# Patient Record
Sex: Female | Born: 1970 | Race: White | Hispanic: No | Smoking: Current every day smoker
Health system: Southern US, Community
[De-identification: ages and names within clinical notes are randomized; demographics above are authoritative.]

## PROBLEM LIST (undated history)

## (undated) DIAGNOSIS — L98499 Non-pressure chronic ulcer of skin of other sites with unspecified severity: Secondary | ICD-10-CM

## (undated) DIAGNOSIS — M1990 Unspecified osteoarthritis, unspecified site: Secondary | ICD-10-CM

## (undated) DIAGNOSIS — K469 Unspecified abdominal hernia without obstruction or gangrene: Secondary | ICD-10-CM

## (undated) DIAGNOSIS — K259 Gastric ulcer, unspecified as acute or chronic, without hemorrhage or perforation: Secondary | ICD-10-CM

## (undated) DIAGNOSIS — R519 Headache, unspecified: Secondary | ICD-10-CM

## (undated) DIAGNOSIS — R7303 Prediabetes: Secondary | ICD-10-CM

## (undated) DIAGNOSIS — E119 Type 2 diabetes mellitus without complications: Secondary | ICD-10-CM

## (undated) DIAGNOSIS — G8929 Other chronic pain: Secondary | ICD-10-CM

## (undated) DIAGNOSIS — Z8719 Personal history of other diseases of the digestive system: Secondary | ICD-10-CM

## (undated) DIAGNOSIS — K219 Gastro-esophageal reflux disease without esophagitis: Secondary | ICD-10-CM

## (undated) DIAGNOSIS — IMO0001 Reserved for inherently not codable concepts without codable children: Secondary | ICD-10-CM

## (undated) DIAGNOSIS — G709 Myoneural disorder, unspecified: Secondary | ICD-10-CM

## (undated) DIAGNOSIS — F32A Depression, unspecified: Secondary | ICD-10-CM

## (undated) DIAGNOSIS — I10 Essential (primary) hypertension: Secondary | ICD-10-CM

## (undated) DIAGNOSIS — IMO0002 Reserved for concepts with insufficient information to code with codable children: Secondary | ICD-10-CM

## (undated) DIAGNOSIS — F419 Anxiety disorder, unspecified: Secondary | ICD-10-CM

## (undated) HISTORY — PX: BACK SURGERY: SHX140

## (undated) HISTORY — PX: TUBAL LIGATION: SHX77

## (undated) HISTORY — PX: APPENDECTOMY: SHX54

## (undated) HISTORY — DX: Other chronic pain: G89.29

## (undated) HISTORY — DX: Reserved for inherently not codable concepts without codable children: IMO0001

## (undated) HISTORY — PX: CHOLECYSTECTOMY: SHX55

## (undated) HISTORY — PX: CARPAL TUNNEL RELEASE: SHX101

## (undated) HISTORY — DX: Essential (primary) hypertension: I10

## (undated) HISTORY — DX: Reserved for concepts with insufficient information to code with codable children: IMO0002

## (undated) HISTORY — PX: MASTECTOMY, PARTIAL: SHX709

## (undated) HISTORY — PX: ROTATOR CUFF REPAIR: SHX139

## (undated) HISTORY — PX: MOUTH SURGERY: SHX715

## (undated) HISTORY — PX: INTRAUTERINE DEVICE INSERTION: SHX323

## (undated) HISTORY — PX: BREAST SURGERY: SHX581

## (undated) HISTORY — DX: Unspecified abdominal hernia without obstruction or gangrene: K46.9

## (undated) HISTORY — DX: Non-pressure chronic ulcer of skin of other sites with unspecified severity: L98.499

---

## 2000-03-11 ENCOUNTER — Encounter: Payer: Self-pay | Admitting: Neurological Surgery

## 2000-03-11 ENCOUNTER — Inpatient Hospital Stay (HOSPITAL_COMMUNITY): Admission: RE | Admit: 2000-03-11 | Discharge: 2000-03-12 | Payer: Self-pay | Admitting: Neurological Surgery

## 2001-03-14 ENCOUNTER — Encounter: Payer: Self-pay | Admitting: Family Medicine

## 2001-03-14 ENCOUNTER — Ambulatory Visit (HOSPITAL_COMMUNITY): Admission: RE | Admit: 2001-03-14 | Discharge: 2001-03-14 | Payer: Self-pay | Admitting: Family Medicine

## 2001-04-16 ENCOUNTER — Ambulatory Visit (HOSPITAL_COMMUNITY): Admission: RE | Admit: 2001-04-16 | Discharge: 2001-04-16 | Payer: Self-pay | Admitting: Pediatrics

## 2001-04-16 ENCOUNTER — Encounter: Payer: Self-pay | Admitting: General Surgery

## 2003-01-15 ENCOUNTER — Emergency Department (HOSPITAL_COMMUNITY): Admission: EM | Admit: 2003-01-15 | Discharge: 2003-01-16 | Payer: Self-pay | Admitting: *Deleted

## 2003-01-17 ENCOUNTER — Emergency Department (HOSPITAL_COMMUNITY): Admission: EM | Admit: 2003-01-17 | Discharge: 2003-01-17 | Payer: Self-pay | Admitting: *Deleted

## 2003-10-05 ENCOUNTER — Emergency Department (HOSPITAL_COMMUNITY): Admission: EM | Admit: 2003-10-05 | Discharge: 2003-10-06 | Payer: Self-pay | Admitting: Emergency Medicine

## 2004-07-25 ENCOUNTER — Emergency Department (HOSPITAL_COMMUNITY): Admission: EM | Admit: 2004-07-25 | Discharge: 2004-07-25 | Payer: Self-pay | Admitting: *Deleted

## 2004-12-27 ENCOUNTER — Ambulatory Visit (HOSPITAL_COMMUNITY): Admission: RE | Admit: 2004-12-27 | Discharge: 2004-12-27 | Payer: Self-pay | Admitting: Family Medicine

## 2005-02-06 ENCOUNTER — Ambulatory Visit (HOSPITAL_COMMUNITY): Admission: RE | Admit: 2005-02-06 | Discharge: 2005-02-06 | Payer: Self-pay | Admitting: Family Medicine

## 2005-03-14 ENCOUNTER — Ambulatory Visit: Payer: Self-pay | Admitting: Orthopedic Surgery

## 2005-03-29 ENCOUNTER — Emergency Department (HOSPITAL_COMMUNITY): Admission: EM | Admit: 2005-03-29 | Discharge: 2005-03-29 | Payer: Self-pay | Admitting: Emergency Medicine

## 2005-04-25 ENCOUNTER — Ambulatory Visit: Payer: Self-pay | Admitting: Orthopedic Surgery

## 2005-05-04 ENCOUNTER — Ambulatory Visit (HOSPITAL_COMMUNITY): Admission: RE | Admit: 2005-05-04 | Discharge: 2005-05-04 | Payer: Self-pay | Admitting: Orthopedic Surgery

## 2005-05-04 ENCOUNTER — Ambulatory Visit: Payer: Self-pay | Admitting: Orthopedic Surgery

## 2005-05-07 ENCOUNTER — Ambulatory Visit: Payer: Self-pay | Admitting: Orthopedic Surgery

## 2005-05-14 ENCOUNTER — Ambulatory Visit: Payer: Self-pay | Admitting: Orthopedic Surgery

## 2005-05-30 ENCOUNTER — Ambulatory Visit: Payer: Self-pay | Admitting: Orthopedic Surgery

## 2005-06-06 ENCOUNTER — Ambulatory Visit: Payer: Self-pay | Admitting: Orthopedic Surgery

## 2005-12-20 ENCOUNTER — Ambulatory Visit: Payer: Self-pay | Admitting: Orthopedic Surgery

## 2006-02-26 ENCOUNTER — Ambulatory Visit (HOSPITAL_COMMUNITY): Admission: RE | Admit: 2006-02-26 | Discharge: 2006-02-26 | Payer: Self-pay | Admitting: Orthopedic Surgery

## 2006-02-26 ENCOUNTER — Ambulatory Visit: Payer: Self-pay | Admitting: Orthopedic Surgery

## 2006-02-28 ENCOUNTER — Ambulatory Visit: Payer: Self-pay | Admitting: Orthopedic Surgery

## 2006-03-11 ENCOUNTER — Ambulatory Visit: Payer: Self-pay | Admitting: Orthopedic Surgery

## 2006-03-14 ENCOUNTER — Ambulatory Visit: Payer: Self-pay | Admitting: Orthopedic Surgery

## 2006-09-11 ENCOUNTER — Encounter (HOSPITAL_COMMUNITY): Admission: RE | Admit: 2006-09-11 | Discharge: 2006-10-11 | Payer: Self-pay | Admitting: Family Medicine

## 2006-12-20 ENCOUNTER — Ambulatory Visit (HOSPITAL_COMMUNITY): Admission: RE | Admit: 2006-12-20 | Discharge: 2006-12-20 | Payer: Self-pay | Admitting: Family Medicine

## 2007-03-12 ENCOUNTER — Ambulatory Visit (HOSPITAL_COMMUNITY): Admission: RE | Admit: 2007-03-12 | Discharge: 2007-03-13 | Payer: Self-pay | Admitting: Neurosurgery

## 2007-05-23 DIAGNOSIS — Z8679 Personal history of other diseases of the circulatory system: Secondary | ICD-10-CM

## 2007-05-28 ENCOUNTER — Telehealth (INDEPENDENT_AMBULATORY_CARE_PROVIDER_SITE_OTHER): Payer: Self-pay | Admitting: Radiology

## 2007-05-28 ENCOUNTER — Ambulatory Visit: Payer: Self-pay | Admitting: Orthopedic Surgery

## 2007-05-28 DIAGNOSIS — M25519 Pain in unspecified shoulder: Secondary | ICD-10-CM

## 2007-05-28 DIAGNOSIS — M2550 Pain in unspecified joint: Secondary | ICD-10-CM | POA: Insufficient documentation

## 2007-06-03 ENCOUNTER — Ambulatory Visit: Payer: Self-pay | Admitting: Orthopedic Surgery

## 2007-06-05 ENCOUNTER — Telehealth: Payer: Self-pay | Admitting: Orthopedic Surgery

## 2007-07-09 ENCOUNTER — Encounter: Payer: Self-pay | Admitting: Orthopedic Surgery

## 2007-07-11 ENCOUNTER — Encounter: Payer: Self-pay | Admitting: Orthopedic Surgery

## 2007-07-11 ENCOUNTER — Ambulatory Visit: Payer: Self-pay | Admitting: Orthopedic Surgery

## 2007-07-11 ENCOUNTER — Ambulatory Visit (HOSPITAL_COMMUNITY): Admission: RE | Admit: 2007-07-11 | Discharge: 2007-07-11 | Payer: Self-pay | Admitting: Orthopedic Surgery

## 2007-07-15 ENCOUNTER — Ambulatory Visit: Payer: Self-pay | Admitting: Orthopedic Surgery

## 2007-07-21 ENCOUNTER — Encounter: Payer: Self-pay | Admitting: Orthopedic Surgery

## 2007-07-21 ENCOUNTER — Encounter (HOSPITAL_COMMUNITY): Admission: RE | Admit: 2007-07-21 | Discharge: 2007-07-23 | Payer: Self-pay | Admitting: Orthopedic Surgery

## 2007-07-25 ENCOUNTER — Encounter (HOSPITAL_COMMUNITY): Admission: RE | Admit: 2007-07-25 | Discharge: 2007-08-24 | Payer: Self-pay | Admitting: Orthopedic Surgery

## 2007-07-29 ENCOUNTER — Ambulatory Visit: Payer: Self-pay | Admitting: Orthopedic Surgery

## 2007-07-31 ENCOUNTER — Encounter: Payer: Self-pay | Admitting: Orthopedic Surgery

## 2007-08-20 ENCOUNTER — Encounter: Payer: Self-pay | Admitting: Orthopedic Surgery

## 2007-08-28 ENCOUNTER — Emergency Department (HOSPITAL_COMMUNITY): Admission: EM | Admit: 2007-08-28 | Discharge: 2007-08-28 | Payer: Self-pay | Admitting: Emergency Medicine

## 2007-09-01 ENCOUNTER — Encounter: Payer: Self-pay | Admitting: Orthopedic Surgery

## 2007-09-03 ENCOUNTER — Telehealth: Payer: Self-pay | Admitting: Orthopedic Surgery

## 2007-09-04 ENCOUNTER — Ambulatory Visit (HOSPITAL_COMMUNITY): Admission: RE | Admit: 2007-09-04 | Discharge: 2007-09-05 | Payer: Self-pay | Admitting: Neurosurgery

## 2008-04-21 ENCOUNTER — Emergency Department (HOSPITAL_COMMUNITY): Admission: EM | Admit: 2008-04-21 | Discharge: 2008-04-21 | Payer: Self-pay | Admitting: Pediatrics

## 2008-12-13 ENCOUNTER — Emergency Department (HOSPITAL_COMMUNITY): Admission: EM | Admit: 2008-12-13 | Discharge: 2008-12-14 | Payer: Self-pay | Admitting: Emergency Medicine

## 2009-01-21 ENCOUNTER — Emergency Department (HOSPITAL_COMMUNITY): Admission: EM | Admit: 2009-01-21 | Discharge: 2009-01-21 | Payer: Self-pay | Admitting: Emergency Medicine

## 2009-02-16 ENCOUNTER — Encounter: Payer: Self-pay | Admitting: Orthopedic Surgery

## 2009-06-03 ENCOUNTER — Encounter: Payer: Self-pay | Admitting: Orthopedic Surgery

## 2009-09-09 ENCOUNTER — Ambulatory Visit (HOSPITAL_COMMUNITY): Admission: RE | Admit: 2009-09-09 | Discharge: 2009-09-09 | Payer: Self-pay | Admitting: Family Medicine

## 2009-09-15 ENCOUNTER — Ambulatory Visit (HOSPITAL_COMMUNITY): Admission: RE | Admit: 2009-09-15 | Discharge: 2009-09-15 | Payer: Self-pay | Admitting: Family Medicine

## 2009-09-16 ENCOUNTER — Encounter (INDEPENDENT_AMBULATORY_CARE_PROVIDER_SITE_OTHER): Payer: Self-pay | Admitting: *Deleted

## 2009-10-18 ENCOUNTER — Encounter (INDEPENDENT_AMBULATORY_CARE_PROVIDER_SITE_OTHER): Payer: Self-pay | Admitting: *Deleted

## 2009-10-18 ENCOUNTER — Ambulatory Visit: Payer: Self-pay | Admitting: Internal Medicine

## 2009-10-20 ENCOUNTER — Ambulatory Visit: Payer: Self-pay | Admitting: Internal Medicine

## 2009-10-20 ENCOUNTER — Ambulatory Visit (HOSPITAL_COMMUNITY): Admission: RE | Admit: 2009-10-20 | Discharge: 2009-10-20 | Payer: Self-pay | Admitting: Internal Medicine

## 2009-10-20 HISTORY — PX: ESOPHAGOGASTRODUODENOSCOPY: SHX1529

## 2009-10-20 HISTORY — PX: COLONOSCOPY: SHX174

## 2009-10-24 DIAGNOSIS — R1084 Generalized abdominal pain: Secondary | ICD-10-CM | POA: Insufficient documentation

## 2009-10-24 DIAGNOSIS — D649 Anemia, unspecified: Secondary | ICD-10-CM | POA: Insufficient documentation

## 2009-10-24 DIAGNOSIS — K921 Melena: Secondary | ICD-10-CM | POA: Insufficient documentation

## 2009-10-25 ENCOUNTER — Encounter: Payer: Self-pay | Admitting: Internal Medicine

## 2009-10-27 ENCOUNTER — Emergency Department (HOSPITAL_COMMUNITY): Admission: EM | Admit: 2009-10-27 | Discharge: 2009-10-27 | Payer: Self-pay | Admitting: Emergency Medicine

## 2009-11-02 ENCOUNTER — Telehealth (INDEPENDENT_AMBULATORY_CARE_PROVIDER_SITE_OTHER): Payer: Self-pay

## 2010-01-26 ENCOUNTER — Encounter (INDEPENDENT_AMBULATORY_CARE_PROVIDER_SITE_OTHER): Payer: Self-pay

## 2010-02-23 ENCOUNTER — Encounter: Payer: Self-pay | Admitting: Internal Medicine

## 2010-03-06 ENCOUNTER — Ambulatory Visit (HOSPITAL_COMMUNITY): Admission: RE | Admit: 2010-03-06 | Discharge: 2010-03-06 | Payer: Self-pay | Admitting: Internal Medicine

## 2010-03-06 ENCOUNTER — Ambulatory Visit: Payer: Self-pay | Admitting: Internal Medicine

## 2010-03-06 HISTORY — PX: ESOPHAGOGASTRODUODENOSCOPY: SHX1529

## 2010-03-12 ENCOUNTER — Encounter: Payer: Self-pay | Admitting: Internal Medicine

## 2010-05-25 ENCOUNTER — Encounter (INDEPENDENT_AMBULATORY_CARE_PROVIDER_SITE_OTHER): Payer: Self-pay | Admitting: *Deleted

## 2010-08-13 ENCOUNTER — Encounter: Payer: Self-pay | Admitting: Family Medicine

## 2010-08-22 NOTE — Letter (Signed)
Summary: [facility]  [facility]   Imported By: Diana Eves 10/25/2009 14:41:23  _____________________________________________________________________  External Attachment:    Type:   Image     Comment:   External Document

## 2010-08-22 NOTE — Letter (Signed)
Summary: Medical record request [employer]  Medical record request [employer]   Imported By: Cammie Sickle 08/06/2009 21:44:49  _____________________________________________________________________  External Attachment:    Type:   Image     Comment:   External Document

## 2010-08-22 NOTE — Progress Notes (Signed)
Summary: samples  ---- Converted from flag ---- ---- 11/01/2009 5:47 PM, [name] MD, Caleen Essex wrote: yes  ---- 11/01/2009 12:08 PM, Hendricks Limes LPN wrote: we do have enough samples if you would like me to give them to her.  ---- 11/01/2009 11:11 AM, [name] MD, FACP [facility] wrote: double check dosing; can we come up w samples? ------------------------------  Appended Document: samples samples at front desk, pts husband will pick up

## 2010-08-22 NOTE — Letter (Signed)
Summary: Internal Other /egd order  Internal Other /egd order   Imported By: Cloria Spring LPN 16/04/9603 54:09:81  _____________________________________________________________________  External Attachment:    Type:   Image     Comment:   External Document

## 2010-08-22 NOTE — Assessment & Plan Note (Signed)
Summary: POST OP 1/RT SHOULDER/CAF    History of Present Illness: I saw Brenda Taylor back in the office today.  She is now 4 days post op right open [place]ford.  DOS 07-11-07.  Today is post op 1 she is taking  tylox for pain, robaxin and phenergan.  She is doing well, pain today is a 1. Tylox 2 at a time helps.  " I do not feel the pain like I did before the surgery"   Prior Medications :  * VERAPAMIL 240MG   * HYDROHLOROTHIAZIDE 25MG   * ALPRAZOLAM .5MG   * TYLOX  * ASPIRIN  LIDODERM 5 %  PTCH (LIDOCAINE) as directed * POTASSIUM CHLORIDE one tablet tid    Current Allergies (reviewed today): ! AMOXICILLIN  Past Medical History:    Reviewed history from 05/23/2007 and no changes required:       Anxiety       High Blood Pressure       Back pain       Panic attacks      Physical Exam  The incision looks good. There is minimal swelling.      Impression & Recommendations:  Problem # 1:  SHOULDER PAIN, RIGHT (ICD-719.41)  Orders: Post-Op Check ([address])    Patient Instructions: 1)  January 5th or 6th  2)  Pull suture out. 3)  Start Physical therapy     ]

## 2010-08-22 NOTE — Letter (Signed)
Summary: Out of Work  [facility]  [address]  [place], [place] [address]   Phone: [phone]  Fax: [phone]    July 29, 2007   Employee:  VEORA FONTE    To Whom It May Concern:   For Medical reasons, please excuse the above named employee from work for the following dates: (6 wks)  Start:    07/29/07    End:    09/09/07 (Approximate)  If you need additional information, please feel free to contact our office.         Sincerely,    Terrance Mass, MD

## 2010-08-22 NOTE — Letter (Addendum)
Summary: Disability FMLA form+ret to work 1  Disability FMLA form+ret to work 1   Imported By: Cammie Sickle 08/06/2007 18:23:12  _____________________________________________________________________  External Attachments:     1. Type:   Image          Comment:   External Document    2. Type:   Image          Comment:   External Document

## 2010-08-22 NOTE — Assessment & Plan Note (Signed)
Summary: SHOULDER PAIN NO INJURYNO XR/MEDCOST/DR. STERN/BS[facility]    Chief Complaint:  right shoulder pain.  History of Present Illness: I saw Brenda Taylor in the office today for a followup visit.    She is a 40 years old woman with the complaint of:  right shoulder pain.   She has seen you forCTR right and left, she previously had neck surgery by Dr. Venetia Maxon Aug 2008  and he recommends that she gets further treatment with our office.    Pain has been there for 6 months, no injury. She will have xrays today in our office. Patient has never had any treatment for this.  She feels like there is something loose in her shoulder, she is having decreases ROM due to weakness and pain. Not taking any medicine for this. Used aspercreme and goodys body pain and that doesn't help.  Current Allergies (reviewed today): ! AMOXICILLIN Updated/Current Medications (including changes made in today's visit):  * VERAPAMIL 240MG   * HYDROHLOROTHIAZIDE 25MG   * ALPRAZOLAM .5MG   * TYLOX  * ASPIRIN  LIDODERM 5 %  PTCH (LIDOCAINE) as directed   Past Medical History:    Reviewed history from 05/23/2007 and no changes required:       Anxiety       High Blood Pressure       Back pain       Panic attacks  Past Surgical History:    Reviewed history from 05/23/2007 and no changes required:       Appendectomy       Back surgery       Cholecystectomy       Oral surgery       Tubaligation       Partial mastectomy       Carpal Tunnel Release right Dr. Romeo Apple    Risk Factors:  Tobacco use:  current     Shoulder/Elbow Exam  Skin:    Her skin is hypersensitive.     Impression & Recommendations:  Problem # 1:  SHOULDER PAIN, RIGHT (ICD-719.41) Assessment: New  Orders: Est. Patient Level III ([address]) Depo- Medrol 40mg  (J1030) Joint Aspirate / Injection, Large (20610) Shoulder x-ray,  minimum 2 views (73030) normal films Type I-II acromion  Verbal consent obtained/The right shoulder was injected  over the anterior acrion - (PMT) with depomedrol 40mg /cc and sensorcaine .25% . There were no complications  MRI at [facility]    Orders: Est. Patient Level III (60[address]) Depo- Medrol 40mg  (J1030) Joint Aspirate / Injection, Large (20610) Shoulder x-ray,  minimum 2 views (09811) Radiology Referral (Radiology)   Medications Added to Medication List This Visit: 1)  Lidoderm 5 % Ptch (Lidocaine) .... As directed   Patient Instructions: 1)  I will call you with the results of the mri of the shoulder. 2)  apply lidoderm patches as needed     Prescriptions: LIDODERM 5 %  PTCH (LIDOCAINE) as directed  #1 box x 1   Entered and Authorized by:   Fuller Canada MD   Signed by:   Fuller Canada MD on 05/28/2007   Method used:   Print then Give to Patient   RxID:   [id number]  ]

## 2010-08-22 NOTE — Letter (Signed)
Summary: Patient Notice, Colon Biopsy Results  [facility]  [address]   [place], [place] [address]   Phone: [phone]  Fax: [phone]       March 12, 2010   Brenda Taylor [address] [place], [place]  [address] 08-13-1970    Dear [name],  I am pleased to inform you that the biopsies taken during your recent colonoscopy did not show any evidence of cancer upon pathologic examination.  Additional information/recommendations:  Please call [phone] to schedule a return visit in 3 months to review your condition.  Continue with the treatment plan as outlined on the day of your exam.  Please call us if you are having persistent problems or have questions about your condition that have not been fully answered at this time.  Sincerely,    [name] MD, FACP [facility] Ph: [phone]    Fax: [phone]   Appended Document: Patient Notice, Colon Biopsy Results letter mailed to pt  Appended Document: Patient Notice, Colon Biopsy Results REMINDER IN COMPUTER

## 2010-08-22 NOTE — Assessment & Plan Note (Signed)
Summary: FOL UP MRI [facility]/RT SHOULDERMEDCOST    History of Present Illness: I saw Dalina Barasch in the office today for a followup visit.  She is a 40 years old woman with the complaint of:  right shoulder pain. Patient is here to go over her mri results of her shoulder. lidoderm patches didnt help. we also tried an injection and that didnt help.  Current Allergies (reviewed today): ! AMOXICILLIN Updated/Current Medications (including changes made in today's visit):  * VERAPAMIL 240MG   * HYDROHLOROTHIAZIDE 25MG   * ALPRAZOLAM .5MG   * TYLOX  * ASPIRIN  LIDODERM 5 %  PTCH (LIDOCAINE) as directed   Past Medical History:    Reviewed history from 05/23/2007 and no changes required:       Anxiety       High Blood Pressure       Back pain       Panic attacks  Past Surgical History:    Reviewed history from 05/23/2007 and no changes required:       Appendectomy       Back surgery       Cholecystectomy       Oral surgery       Tubaligation       Partial mastectomy       Carpal Tunnel Release right Dr. 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 ([facility]) Orders: Est. Patient Level II ([address]3267)    Patient Instructions: 1)  F/U 23RD OF DECEMBER POST OP FROM RIGHT SHOULDER SURGERY  2)  PREOP IS 17TH OF DEC AT 1045AM 3)  SURGERY IS 19 OF DEC     ]

## 2010-08-22 NOTE — Op Note (Signed)
Summary: Operative Report  Operative Report   Imported By: Elvera Maria 05/27/2007 11:25:50  _____________________________________________________________________  External Attachment:    Type:   Image     Comment:   CTR RIGHT

## 2010-08-22 NOTE — Letter (Signed)
Summary: Recall Office Visit  [facility]  [address]   [place], [place] [address]   Phone: [phone]  Fax: [phone]      May 25, 2010   SHAMECCA WHITEBREAD [address] [place], [place]  [address] 1971/05/06   Dear Ms. Doxtater,   According to our records, it is time for you to schedule a follow-up office visit with [place].   At your convenience, please call [phone] to schedule an office visit. If you have any questions, concerns, or feel that this letter is in error, we would appreciate your call.   Sincerely,    Diana Eves  [facility] Ph: [phone]   Fax: [phone]

## 2010-08-22 NOTE — Progress Notes (Signed)
  Phone Note Outgoing Call   Call placed by: Waldon Reining,  May 28, 2007 3:28 PM Call placed to: Patient Action Taken: Phone Call Completed Summary of Call: I called the patient to give her MRI appointment at [facility] on 05-31-07 at 8:30. I told the patient that Dr. Romeo Apple would call her with her results. Initial call taken by: Waldon Reining,  May 28, 2007 3:31 PM

## 2010-08-22 NOTE — Letter (Signed)
Summary: Out of Work Note  [facility]  [address]   [place], [place] [address]   Phone: [phone]  Fax: [phone]    10/18/2009  TO: Leodis Sias IT MAY CONCERN  RE: Brenda Taylor [address] [place] [birth year]       The above named individual is currently under my care and will be out of work    FROM: 10/19/2009    MAY RETURN ON: 10/21/2009     If you have any further questions or need additional information, please call.     Sincerely,     [facility] [name], M.D.    Jonette Eva, M.D. Lorenza Burton, FNP-BC    Tana Coast, PA-C Phone: [phone]    Fax: [phone]

## 2010-08-22 NOTE — Progress Notes (Signed)
Summary: Office Visit  Office Visit   Imported By: Elvera Maria 05/27/2007 11:23:31  _____________________________________________________________________  External Attachment:    Type:   Image     Comment:   initial visit

## 2010-08-22 NOTE — Progress Notes (Signed)
Summary: Office Visit  Office Visit   Imported By: Elvera Maria 05/27/2007 11:24:59  _____________________________________________________________________  External Attachment:    Type:   Image     Comment:   progress note

## 2010-08-22 NOTE — Progress Notes (Signed)
Summary: pt relates having emergency back surgery  Phone Note Outgoing Call   Call placed by: Cammie Sickle,  September 03, 2007 3:23 PM Call placed to: Patient Summary of Call: called pt to confirm appt (re-ck/post op rt shoulder); pt cancelling -states she is scheduled for emergency back surgery tomorrow 09/04/07 for "blown disc" in [place]. Will call back to reschedule asap Initial call taken by: Cammie Sickle,  September 03, 2007 3:25 PM  Follow-up for Phone Call       Follow-up by: Fuller Canada MD,  September 04, 2007 8:33 AM

## 2010-08-22 NOTE — Assessment & Plan Note (Signed)
Summary: npp,abnormal labs,glu   Visit Type:  Initial Consult Primary Care Provider:  cresenzo  Chief Complaint:  abnormal labs.  History of Present Illness: Pleasant morbidly obese 40 year old lady with mild anemia and Hemoccult positive stool referred for further evaluation. Brenda Taylor relates chronic constipation all of her life. She take a number of over-the-counter remedies to assist with bowel function. She often gets the urge to have a BM;strain but really doesn't have  a good bowel movement without use of multiple laxatives weekly. She apparently was found to be mildly anemic recently with a hemoglobin of 11.7.  It was normochromic. She returned Hemoccult cards to the Dr. Geanie Logan office at least one was positive. She has not noted any gross blood per rectum or melena; she denies the upper GI tract symptoms such as odynaphagia, dysphagia, dysphagia, early satiety reflux symptoms nausea or vomiting.  Preventive Screening-Counseling & Management  Alcohol-Tobacco     Smoking Status: current  Current Problems (verified): 1)  Shoulder Pain, Right  (ICD-719.41) 2)  High Blood Pressure  (ICD-V12.50)  Current Medications (verified): 1)  Verapamil 240mg  2)  Hydrohlorothiazide 25mg  3)  Alprazolam .5mg  4)  Tylox 5)  Aspirin 6)  Lidoderm 5 %  Ptch (Lidocaine) .... As Directed 7)  Potassium Chloride .... One Tablet Tid  Allergies (verified): 1)  ! Amoxicillin  Past History:  Past Medical History: Last updated: 05/23/2007 Anxiety High Blood Pressure Back pain Panic attacks  Family History: Last updated: 10/18/2009 Father: alive-healthy Mother: deceased-chf, dm, lymph node cancer Siblings: 1 sister Family History of Colon Cancer:grandfather  Social History: Last updated: 10/18/2009 Marital Status: Married Children: 2 Occupation: yes,[employer] Patient currently smokes.  Alcohol Use - no  Risk Factors: Smoking Status: current (10/18/2009)  Past Surgical  History: Appendectomy Back surgery x 3 Cholecystectomy Oral surgery Tubaligation Partial mastectomy Carpal Tunnel Release right Dr. Romeo Apple  Family History: Father: alive-healthy Mother: deceased-chf, dm, lymph node cancer Siblings: 1 sister Family History of Colon Cancer:grandfather  Social History: Marital Status: Married Children: 2 Occupation: yes,[employer] Patient currently smokes.  Alcohol Use - no  Vital Signs:  Patient profile:   40 year old female Height:      66 inches Weight:      286 pounds BMI:     46.33 Temp:     98.2 degrees F oral Pulse rate:   68 / minute BP sitting:   118 / 80  (left arm) Cuff size:   large  Vitals Entered By: Hendricks Limes LPN (October 18, 2009 11:33 AM)  Physical Exam  General:  very alert pleasant lady resting comfortably she is accompanied by her husband Eyes:  no scleral icterus. Conjunctiva are pink Lungs:  clear to auscultation Heart:  regular rate and rhythm without murmur gallop rub Abdomen:  nondistended obese positive bowel sounds entirely soft and nontender without appreciable mass or organomegaly Rectal:  deferred until the time of colonoscopy  Impression & Recommendations: Impression: 40 year old morbidly obese lady with chronic constipation and Hemoccult-positive stool. She has a mild anemia. I agree with Dr. Nobie Putnam further evaluation of her lower GI tract is warranted. She needs a colonoscopy. I do not detect any upper GI tract symptoms at this time.  Recommendations: Diagnostic colonoscopy in the very near future to further evaluate Hemoccult-positive stool and her constipation. Risks, benefits, limitations, alternatives and imponderables have been reviewed. Apart his questions answered. All parties agreeable. We'll set up colonoscopy in the very near future. If colonoscopy is unrevealing as to the cause of  her GI bleed would contemplate a diagnostic EGD following colonoscopy. The patient is also agreeable to this  approach.  Further recommendations to follow. I'd like to thank Dr. Nobie Putnam for allowing to see this nice lady today.  Appended Document: Orders Update    Clinical Lists Changes  Problems: Added new problem of HEMOCCULT POSITIVE STOOL (ICD-578.1) Added new problem of ANEMIA (ICD-285.9) Added new problem of ABDOMINAL PAIN, GENERALIZED (ICD-789.07) Orders: Added new Service order of Consultation Level IV [phone]) - Signed

## 2010-08-22 NOTE — Assessment & Plan Note (Signed)
Summary: POST OP 2/RT SHOULDER/CAF    History of Present Illness: I saw Brenda Taylor in the office today for a followup visit.  She is a 40 years old woman with the complaint of:  right shoulder.  Patient had surgery on her right shoulder on 07-11-07. This is her 2nd post op visit.  She is due to get her suture out today.  She started physical therapy on 07-21-07. Patient states that her incision is sore and she is out of pain medicine.     Prior Medications :  * VERAPAMIL 240MG   * HYDROHLOROTHIAZIDE 25MG   * ALPRAZOLAM .5MG   * TYLOX  * ASPIRIN  LIDODERM 5 %  PTCH (LIDOCAINE) as directed * POTASSIUM CHLORIDE one tablet tid    Current Allergies: ! AMOXICILLIN      Physical Exam  The incision looks good. There is minimal swelling.       Impression & Recommendations:  Problem # 1:  SHOULDER PAIN, RIGHT (ICD-719.41) Assessment: Improved  Orders: Post-Op Check ([address]11)    Patient Instructions: 1)  Please schedule a follow-up appointment in 1 month. 2)  check ROM  3)  Recommended remaining out of work; out of work note provided 4)   for 6 weeks    Prescriptions: TYLOX   #90 x 0   Entered and Authorized by:   Fuller Canada MD   Signed by:   Fuller Canada MD on 07/29/2007   Method used:   Print then Give to Patient   RxID:   [id number]  ]

## 2010-08-22 NOTE — Letter (Signed)
Summary: Recall Colonoscopy/Endoscopy, Change to Office Visit  [facility]  [address]   [place], [place] [address]   Phone: [phone]  Fax: [phone]      January 26, 2010   Brenda Taylor [address] [place], [place]  [address] Oct 28, 1970   Dear [name],   According to our records, it is time for you to schedule an Endoscopy.  Please call our office and ask to speak with the triage nurse to get this  scheduled. We will be looking forward to hearing from you shortly.   Sincerely,   Cloria Spring LPN  [facility] Ph: [phone]   Fax: [phone]

## 2010-08-22 NOTE — Letter (Signed)
Summary: Medical record request [employer]  Medical record request [employer]   Imported By: Cammie Sickle 02/25/2009 19:31:00  _____________________________________________________________________  External Attachment:    Type:   Image     Comment:   External Document

## 2010-08-22 NOTE — Miscellaneous (Signed)
Summary: Rehab Report  Rehab Report   Imported By: Elvera Maria 08/26/2007 14:03:36  _____________________________________________________________________  External Attachment:    Type:   Image     Comment:   progress

## 2010-08-22 NOTE — Letter (Signed)
Summary: [facility]/POSS EGD ORDER  [facility]/POSS EGD ORDER   Imported By: Ave Filter 10/18/2009 12:27:51  _____________________________________________________________________  External Attachment:    Type:   Image     Comment:   External Document

## 2010-08-22 NOTE — Letter (Signed)
Summary: REFFERRAL DR Nobie Putnam  REFFERRAL DR Nobie Putnam   Imported By: Diana Eves 10/20/2009 14:11:09  _____________________________________________________________________  External Attachment:    Type:   Image     Comment:   External Document

## 2010-08-22 NOTE — Progress Notes (Signed)
  Phone Note Call from Patient   Caller: Patient Call For: RX as discussed at visit 06/03/07 Summary of Call: States Dr mentioned giving her something for pain. Now feels needs it.  At work today & pain increasing. Please call Work [facility] [phone] Initial call taken by: Cammie Sickle,  June 05, 2007 12:30 PM  Follow-up for Phone Call        i said she can take ibuprofen or the tylox she has  Follow-up by: Fuller Canada MD,  June 05, 2007 1:17 PM  Additional Follow-up for Phone Call Additional follow up Details #1::        Lft msg ans machine Additional Follow-up by: Cammie Sickle,  June 05, 2007 5:55 PM

## 2010-08-22 NOTE — Letter (Signed)
Summary: Scheduled Appointment  [facility]  [address]   [place], [place] [address]   Phone: [phone]  Fax: [phone]    September 16, 2009   Dear: Brenda Taylor            DOB: [birth year]    I have been instructed to schedule you an appointment in our office.  Your appointment is as follows:   Date: Wed 10/12/09   Time: 11:00am   Please be here 15 minutes early.   Provider: Dr Jena Gauss    Please contact the office if you need to reschedule this appointment for a more convenient time.   Thank you,    [facility] Ph: [phone]   Fax: [phone]

## 2010-09-04 ENCOUNTER — Emergency Department (HOSPITAL_COMMUNITY): Payer: Self-pay

## 2010-09-04 ENCOUNTER — Emergency Department (HOSPITAL_COMMUNITY)
Admission: EM | Admit: 2010-09-04 | Discharge: 2010-09-04 | Disposition: A | Discharge status: Home / Self Care | Payer: Self-pay | Attending: Emergency Medicine | Admitting: Emergency Medicine

## 2010-09-04 DIAGNOSIS — Z79899 Other long term (current) drug therapy: Secondary | ICD-10-CM | POA: Insufficient documentation

## 2010-09-04 DIAGNOSIS — G8929 Other chronic pain: Secondary | ICD-10-CM | POA: Insufficient documentation

## 2010-09-04 DIAGNOSIS — F411 Generalized anxiety disorder: Secondary | ICD-10-CM | POA: Insufficient documentation

## 2010-09-04 DIAGNOSIS — I10 Essential (primary) hypertension: Secondary | ICD-10-CM | POA: Insufficient documentation

## 2010-09-04 DIAGNOSIS — M542 Cervicalgia: Secondary | ICD-10-CM | POA: Insufficient documentation

## 2010-09-05 ENCOUNTER — Emergency Department (HOSPITAL_COMMUNITY)
Admission: EM | Admit: 2010-09-05 | Discharge: 2010-09-05 | Disposition: A | Discharge status: Home / Self Care | Payer: Self-pay | Attending: Emergency Medicine | Admitting: Emergency Medicine

## 2010-09-05 DIAGNOSIS — X58XXXA Exposure to other specified factors, initial encounter: Secondary | ICD-10-CM | POA: Insufficient documentation

## 2010-09-05 DIAGNOSIS — G8929 Other chronic pain: Secondary | ICD-10-CM | POA: Insufficient documentation

## 2010-09-05 DIAGNOSIS — S139XXA Sprain of joints and ligaments of unspecified parts of neck, initial encounter: Secondary | ICD-10-CM | POA: Insufficient documentation

## 2010-09-05 DIAGNOSIS — I10 Essential (primary) hypertension: Secondary | ICD-10-CM | POA: Insufficient documentation

## 2010-09-05 DIAGNOSIS — M549 Dorsalgia, unspecified: Secondary | ICD-10-CM | POA: Insufficient documentation

## 2010-09-05 DIAGNOSIS — Z79899 Other long term (current) drug therapy: Secondary | ICD-10-CM | POA: Insufficient documentation

## 2010-10-11 LAB — BASIC METABOLIC PANEL
BUN: 12 mg/dL (ref 6–23)
CO2: 23 mEq/L (ref 19–32)
Calcium: 9 mg/dL (ref 8.4–10.5)
Chloride: 108 mEq/L (ref 96–112)
Creatinine, Ser: 0.52 mg/dL (ref 0.4–1.2)
GFR calc Af Amer: >60 mL/min (ref >60)
GFR calc non Af Amer: >60 mL/min (ref >60)
Glucose, Bld: 115 mg/dL — ABNORMAL HIGH (ref 70–99)
Potassium: 3.7 mEq/L (ref 3.5–5.1)
Sodium: 139 mEq/L (ref 135–145)

## 2010-10-11 LAB — POCT CARDIAC MARKERS
CKMB, poc: <1 ng/mL — ABNORMAL LOW (ref 1.0–8.0)
CKMB, poc: <1 ng/mL — ABNORMAL LOW (ref 1.0–8.0)
Myoglobin, poc: 36.7 ng/mL (ref 12–200)
Myoglobin, poc: 38.1 ng/mL (ref 12–200)
Troponin i, poc: <0.05 ng/mL (ref 0.00–0.09)
Troponin i, poc: <0.05 ng/mL (ref 0.00–0.09)

## 2010-10-11 LAB — DIFFERENTIAL
Basophils Absolute: 0.1 10*3/uL (ref 0.0–0.1)
Basophils Relative: 1 % (ref 0–1)
Eosinophils Absolute: 0.3 10*3/uL (ref 0.0–0.7)
Eosinophils Relative: 3 % (ref 0–5)
Lymphocytes Relative: 32 % (ref 12–46)
Lymphs Abs: 3.1 10*3/uL (ref 0.7–4.0)
Monocytes Absolute: 0.7 10*3/uL (ref 0.1–1.0)
Monocytes Relative: 7 % (ref 3–12)
Neutro Abs: 5.5 10*3/uL (ref 1.7–7.7)
Neutrophils Relative %: 57 % (ref 43–77)

## 2010-10-11 LAB — HEPATIC FUNCTION PANEL
ALT: 20 U/L (ref 0–35)
AST: 19 U/L (ref 0–37)
Albumin: 3.4 g/dL — ABNORMAL LOW (ref 3.5–5.2)
Alkaline Phosphatase: 65 U/L (ref 39–117)
Bilirubin, Direct: <0.1 mg/dL (ref 0.0–0.3)
Indirect Bilirubin: 0.3 mg/dL (ref 0.3–0.9)
Total Bilirubin: 0.3 mg/dL (ref 0.3–1.2)
Total Protein: 7 g/dL (ref 6.0–8.3)

## 2010-10-11 LAB — CBC
HCT: 33.8 % — ABNORMAL LOW (ref 36.0–46.0)
Hemoglobin: 11.7 g/dL — ABNORMAL LOW (ref 12.0–15.0)
MCHC: 34.5 g/dL (ref 30.0–36.0)
MCV: 85.3 fL (ref 78.0–100.0)
Platelets: 274 10*3/uL (ref 150–400)
RBC: 3.97 MIL/uL (ref 3.87–5.11)
RDW: 14.4 % (ref 11.5–15.5)
WBC: 9.7 10*3/uL (ref 4.0–10.5)

## 2010-10-11 LAB — LIPASE, BLOOD: Lipase: 28 U/L (ref 11–59)

## 2010-10-11 LAB — D-DIMER, QUANTITATIVE: D-Dimer, Quant: 0.47 ug/mL-FEU (ref 0.00–0.48)

## 2010-10-29 LAB — CBC
HCT: 35.3 % — ABNORMAL LOW (ref 36.0–46.0)
Hemoglobin: 12.2 g/dL (ref 12.0–15.0)
MCHC: 34.5 g/dL (ref 30.0–36.0)
MCV: 83.2 fL (ref 78.0–100.0)
Platelets: 286 10*3/uL (ref 150–400)
RBC: 4.24 MIL/uL (ref 3.87–5.11)
RDW: 15 % (ref 11.5–15.5)
WBC: 12.9 10*3/uL — ABNORMAL HIGH (ref 4.0–10.5)

## 2010-10-29 LAB — DIFFERENTIAL
Basophils Absolute: 0 10*3/uL (ref 0.0–0.1)
Basophils Relative: 0 % (ref 0–1)
Eosinophils Absolute: 0.3 10*3/uL (ref 0.0–0.7)
Eosinophils Relative: 2 % (ref 0–5)
Lymphocytes Relative: 32 % (ref 12–46)
Lymphs Abs: 4.2 10*3/uL — ABNORMAL HIGH (ref 0.7–4.0)
Monocytes Absolute: 0.7 10*3/uL (ref 0.1–1.0)
Monocytes Relative: 5 % (ref 3–12)
Neutro Abs: 7.8 10*3/uL — ABNORMAL HIGH (ref 1.7–7.7)
Neutrophils Relative %: 60 % (ref 43–77)

## 2010-10-29 LAB — BASIC METABOLIC PANEL
BUN: 13 mg/dL (ref 6–23)
CO2: 24 mEq/L (ref 19–32)
Calcium: 9 mg/dL (ref 8.4–10.5)
Chloride: 106 mEq/L (ref 96–112)
Creatinine, Ser: 0.56 mg/dL (ref 0.4–1.2)
GFR calc Af Amer: >60 mL/min (ref >60)
GFR calc non Af Amer: >60 mL/min (ref >60)
Glucose, Bld: 97 mg/dL (ref 70–99)
Potassium: 3.3 mEq/L — ABNORMAL LOW (ref 3.5–5.1)
Sodium: 138 mEq/L (ref 135–145)

## 2010-10-29 LAB — POCT CARDIAC MARKERS
CKMB, poc: <1 ng/mL — ABNORMAL LOW (ref 1.0–8.0)
Myoglobin, poc: 53.6 ng/mL (ref 12–200)
Troponin i, poc: <0.05 ng/mL (ref 0.00–0.09)

## 2010-12-05 NOTE — H&P (Signed)
Brenda Taylor, Brenda Taylor                 ACCOUNT NO.:  [id number]   MEDICAL RECORD NO.:  [id number]          PATIENT TYPE:  AMB   LOCATION:  DAY                           FACILITY:  [facility]   PHYSICIAN:  Vickki Hearing, M.D.DATE OF BIRTH:  [birth year]   DATE OF ADMISSION:  DATE OF DISCHARGE:  LH                              HISTORY & PHYSICAL   CHIEF COMPLAINT:  Pain in her right shoulder.   Brenda Taylor is a lady I have followed for several months now, I have been  following her with the neurosurgeon.  She has a pain in her right  shoulder which has failed physical therapy and cortisone injection.  She  has been evaluated by the neurosurgeon and her MRI findings of arthritis  of the [facility] joints with rotator cuff impingement from the distal clavicle  seem to be causing her symptoms.  We gave her Lidoderm patches to try to  control her pain in the interim from her last visit to surgery, and that  did not work.  She has also been on some Tylox for pain relief.  This  seems to take the edge off.  Her neurosurgery evaluation recommended  that the shoulder be addressed.   CURRENT ALLERGIES:  AMOXICILLIN.   MEDICATIONS:  1. Verapamil 240 mg.  2. Hydrochlorothiazide 25 mg.  3. Alprazolam 0.5 mg.  4. Tylox one q.4h. p.r.n. for pain.  5. Aspirin one a day.  6. Lidoderm patch as directed.   MEDICAL HISTORY:  Anxiety, hypertension, back pain, panic attacks.   PAST SURGERIES:  Appendectomy, cholecystectomy, back surgery, oral  surgery, tubal ligation, partial mastectomy, carpal tunnel release right  and left upper extremity.   REVIEW OF SYSTEMS:  Negative.   FAMILY HISTORY:  Noncontributory.   SOCIAL HISTORY:  Noncontributory.   PHYSICAL EXAM:  This is a slightly obese female, otherwise normal  development, grooming, hygiene.  CARDIOVASCULAR:  Normal.  NEUROLOGIC:  Normal sensation in the right upper extremity.  SKIN:  Normal findings.  LYMPH NODES:  Negative in the cervical spine and  axilla.  The patient exhibited restricted range of motion in forward elevation  with a positive impingement sign, and rotator cuff strength was normal.  She had no instability.  Muscle tone was normal.  Range of motion was  limited in internal rotation, abduction and flexion.  She has a positive  cross-chest adduction test and pain over the distal clavicle to  palpation.   IMPRESSION:  Shoulder pain with acromioclavicular joint arthritis.   PLAN:  Open AC joint resection, right shoulder.      Vickki Hearing, M.D.  Electronically Signed     SEH/MEDQ  D:  07/10/2007  T:  07/11/2007  Job:  [id number]   cc:   [facility]  Fax: [phone]

## 2010-12-05 NOTE — Op Note (Signed)
NAMEMARICIA, Brenda Taylor                 ACCOUNT NO.:  [id number]   MEDICAL RECORD NO.:  [id number]          PATIENT TYPE:  OIB   LOCATION:  3536                         FACILITY:  [facility]   PHYSICIAN:  Danae Orleans. Venetia Maxon, M.D.  DATE OF BIRTH:  [birth year]   DATE OF PROCEDURE:  09/04/2007  DATE OF DISCHARGE:  09/05/2007                               OPERATIVE REPORT   PREOPERATIVE DIAGNOSES:  1. Recurrent herniated lumbar disk, Brenda Taylor, left.  2. Morbid obesity.  3. Spondylosis.  4. Degenerative disease.  5. Radiculopathy.   POSTOPERATIVE DIAGNOSES:  1. Recurrent herniated lumbar disk, Brenda Taylor, left.  2. Morbid obesity.  3. Spondylosis.  4. Degenerative disease.  5. Radiculopathy.   PROCEDURE:  Left Brenda Taylor redo microdiskectomy with microdissection.   SURGEON:  Danae Orleans. Venetia Maxon, M.D.   ASSISTANT:  Clydene Fake, M.D.   ANESTHESIA:  General endotracheal anesthesia.   BLOOD LOSS:  Minimal.   COMPLICATIONS:  None.   DISPOSITION:  To recovery.   INDICATIONS:  Brenda Taylor is a 40 year old woman who is morbidly obese  who has previously had a Brenda Taylor herniated disk in the left who now has  developed excruciating pain and weakness in her left leg and was found  to have a large recurrent disk herniation with significant Brenda Taylor nerve root  compression.  It was elected to take her to surgery for microdiskectomy.   PROCEDURE:  Brenda Taylor was brought to the operating room.  Following  satisfactory and uncomplicated induction of general endotracheal  anesthesia and placement of intravenous lines, the patient was placed in  a prone position on the Wilson frame.  Her low back was then prepped and  draped in the usual sterile fashion.  Area of planned incision was  infiltrated with 0.25% Marcaine and 0.5% lidocaine with 1:200,000  epinephrine.  Incision was made in the midline through a previous  incision and carried through approximately 4 inches of adipose tissue to  the lumbodorsal fascia which was  incised in the left side of midline.  Subperiosteal dissection was performed exposing the Brenda Taylor and Brenda Taylor  levels.  Brenda Taylor level was highly scarred, and scar tissue was carefully  removed.  Intraoperative x-ray confirmed marker probes at the Brenda Taylor and  Brenda Taylor levels.  Hemi-semi-laminectomy of L4 was then performed with a  high-speed drill and completed with Kerrison rongeurs, and painstaking  dissection through scar tissue was performed along the lateral recess  and also with a foraminotomy overlying the Brenda Taylor nerve root.  Microscope  was brought into field, and using microdissection technique, the thecal  sac and Brenda Taylor nerve were mobilized medially exposing multiple extremely  large fragments of herniated disk material which were causing  significant Brenda Taylor nerve root compression.  The floor of the canal was then  palpated.  There did not appear to be any residual disk herniation, nor  did there appear to be significant or right flank disruption of the  interspace.  It was therefore elected not to further incise the annulus.  Thin ball-tip probes were easily inserted out the neural foramen along  the course of the  Brenda Taylor nerve root and also underlying the thecal sac.  It  was felt that the nerve root and thecal sac were well decompressed at  this point.  Hemostasis was assured, and the wound was irrigated.  After  doing so, the operative site was bathed in Depo-Medrol and fentanyl.  The self-retaining retractor was removed.  The lumbodorsal fascia was  closed with 0 Vicryl suture.  Subcutaneous tissues were approximated  with 2-0 Vicryl interrupted inverted sutures.  Skin edges were  approximated with interrupted 3-0 Vicryl subcuticular stitch.  Wound was  dressed with Dermabond.  The patient was extubated in the operating room  and taken to the recovery room, having tolerated the procedure well.      Danae Orleans. Venetia Maxon, M.D.  Electronically Signed     JDS/MEDQ  D:  09/04/2007  T:  09/06/2007  Job:   [id number]

## 2010-12-05 NOTE — Op Note (Signed)
NAMEJAMEKIA, Taylor                 ACCOUNT NO.:  [id number]   MEDICAL RECORD NO.:  [id number]          PATIENT TYPE:  OIB   LOCATION:  3028                         FACILITY:  [facility]   PHYSICIAN:  Danae Orleans. Venetia Maxon, M.D.  DATE OF BIRTH:  1971/02/02   DATE OF PROCEDURE:  03/12/2007  DATE OF DISCHARGE:  03/13/2007                               OPERATIVE REPORT   PREOPERATIVE DIAGNOSIS:  Herniated cervical disc C6-C7 with myelopathy,  stenosis, spondylosis, and radiculopathy.   POSTOPERATIVE DIAGNOSIS:  Herniated cervical disc C6-C7 with myelopathy,  stenosis, spondylosis, and radiculopathy.   PROCEDURE:  Anterior cervical decompression and fusion C6-C7 with PEEK  interbody cage, morcellized bone autograft, and anterior cervical plate.   SURGEON:  Danae Orleans. Venetia Maxon, M.D.   ASSISTANT:  Stefani Dama, M.D.   ANESTHESIA:  General endotracheal anesthesia.   ESTIMATED BLOOD LOSS:  Minimal.   COMPLICATIONS:  None.   DISPOSITION:  To recovery.   INDICATIONS:  Brenda Taylor is a 40 year old morbidly obese woman with an  extremely large disc herniation at C6-C7 causing severe spinal cord  compression and severe left greater than right upper extremity pain and  weakness.  She was seen in the office on an emergent basis on March 11, 2007, and then was treated with oral Decadron with surgery planned for  August 20.  The patient is morbidly obese and weighs 300 pounds and is 5  feet 4 inches total.   DESCRIPTION OF PROCEDURE:  Brenda Taylor was brought to the operating room.  Following satisfactory uncomplicated induction of general endotracheal  anesthesia and placement of intravenous lines, the patient was placed in  a supine position on the operating room table.  Her neck was placed in  slight extension with 10 pounds of halter traction and her shoulders  were wrapped to facilitate x-ray visualization at the operative site.  The skin was prepped and draped in the usual sterile fashion.   The  planned incision was infiltrated with 0.25% Marcaine and 0.5% lidocaine  with 1:100,000 epinephrine.  An incision was made from the midline to  the anterior border of the sternocleidomastoid muscle to the left of  midline and carried through approximately 1 inch of adipose tissue to  the platysma layer which was identified.  The platysma was incised and  the anterior border sternocleidomastoid muscle was identified and using  blunt dissection, the carotid sheath was kept lateral, the trachea and  esophagus kept medial, exposing the anterior cervical spine.  A bent  spinal needle was placed at what was felt to be the C5-C6 level and an  intraoperative x-ray was obtained but because of the patient's large  body habitus, it was not possible to visualize this level.  Subsequently, a bent spinal needle was placed at what was felt to be the  C4-C5 level as well as the C5-C6 level and a second x-ray demonstrated  the marker probe was at the C4-C5 level, but none on her cervical spine  was visualized below this level. By counting down the, C6-C7 level was  identified relative to the C4-C5  level.   The longus colli muscles were then taken down from the anterior cervical  spine bilaterally using electrocautery and Key elevator and 55 mm  retractor blades were placed to facilitate exposure.  Distraction pins  were placed at C6 and C7 and using gentle distraction, the interspace  was opened after it had been incised.  Disc material was removed in a  piecemeal fashion.  The endplates were stripped of residual disc  material and the microscope was brought onto the field.  Under  microscopic visualization, multiple large fragments of herniated disc  material were removed with resultant significant decompression of the  thecal sac and spinal cord dura.  Both neural foramina were decompressed  and the endplates of C6 and C7 were undercut with removal of residual  disc material.  It was felt that the  spinal cord dura was sufficiently  decompressed.  Hemostasis was assured with Gelfoam soaked in thrombin.  After trial sizing, the 7 mm medium PEEK interbody cage was selected,  packed with morcellized autograft which was retained from drilling of  the endplates and supplemented with demineralized bone matrix, was then  inserted in the interspace and countersunk appropriately.  The  distraction pins were removed.  A 14 tressel anterior cervical plate was  affixed to the anterior cervical spine using variable 14 mm screws, two  at C6, two at C7, all screws had excellent purchase.  Locking mechanisms  were engaged.  Final x-ray was not obtained as it was felt it would not  be possible to visualize this level.  Hemostasis was assured and soft  tissues were inspected and found to be in good repair.   The wound was irrigated.  Subsequently, the self-retaining retractor was  removed and, again, soft tissues were inspected and found to be in good  repair.  Hemostasis was assured.  The platysma layer was closed with 3-0  Vicryl sutures and skin edges were reapproximated with 3-0 Vicryl  subcuticular stitch.  The wound was dressed with Dermabond.  The patient  was extubated in the operating room and taken to the recovery room in  stable satisfactory position having tolerated the operation well.  Counts were correct at the end of the case.      Danae Orleans. Venetia Maxon, M.D.  Electronically Signed     JDS/MEDQ  D:  03/12/2007  T:  03/13/2007  Job:  [id number]

## 2010-12-05 NOTE — Op Note (Signed)
[name], [name]                 ACCOUNT NO.:  [id number]   MEDICAL RECORD NO.:  [id number]          PATIENT TYPE:  AMB   LOCATION:  DAY                           FACILITY:  [facility]   PHYSICIAN:  Vickki Hearing, M.D.DATE OF BIRTH:  12/25/1970   DATE OF PROCEDURE:  07/11/2007  DATE OF DISCHARGE:                               OPERATIVE REPORT   HISTORY:  This is a 40 year old female who was followed for several  months with right shoulder pain.  She eventually had an MRI when  conservative treatment failed.  The MRI showed an impinging osteophyte  from the acromioclavicular joint with inflammation around the joint and  hypertrophy of the [facility] joint.  No cuff tear was seen.  The patient failed  physical therapy and surgery was recommended with risks and benefits  explained.   PREOPERATIVE DIAGNOSIS:  Acromioclavicular joint arthritis, right  shoulder.   POSTOPERATIVE DIAGNOSIS:  Acromioclavicular joint arthritis, right  shoulder.   PROCEDURE:  Open Mumford procedure, right shoulder surgeon.   HARRISON:  Vickki Hearing, M.D.  Assisted by [facility], R.N.F.A.   ANESTHETIC:  General.   FINDINGS:  Osteoarthritis, right acromioclavicular joint.   SPECIMEN:  Distal clavicle.   FINDINGS:  Impinging osteophyte, right acromioclavicular joint.  The  specimen went to pathology.   Blood loss was minimal.  There were no complications.  The patient went  to PACU in good condition.   PROCEDURE:  The patient was identified as Brenda Taylor.  She marked her  right shoulder as the surgical site.  I countersigned her marking.  I  updated her history and physical.  She was taken to surgery, given  general anesthetic and Ancef 1 g.  She tolerated that well and  anesthetic induction was smooth.  She was placed in the beach-chair  position.  Her right arm was prepped with DuraPrep, then draped  sterilely.  The time-out procedure was completed and the procedure was  confirmed as  stated.   Dilute Marcaine-epinephrine solution was injected in the subcu tissue  around the wound.  A straight incision was made over the [facility] joint.  Subcutaneous tissue was divided down to the periosteum.  A longitudinal  incision was made in the periosteum over the [facility] joint and subperiosteal  dissection was done until the distal clavicle was exposed.  The [facility] joint  was resected by removing the distal end of the clavicle approximately  0.8 cm.   The wounds were irrigated.  Hemostasis was obtained using  electrocautery.  We used bone wax in the distal end of the clavicle.  We  irrigated the wounds, closed the periosteum with 0 Monocryl in  interrupted fashion, subcu tissue closed with Monocryl #0 and 2-0  Monocryl and then a 3-0 running Prolene suture was used to close the  skin.  Steri-Strips were applied and a pain pump catheter was inserted a  subcu tissue, activated, 20 mL of Marcaine-epinephrine solution was  injected in the soft tissue.  The patient was placed in a Cryo/Cuff and  she was extubated and taken to the recovery room  in good condition.   POSTOP PLAN:  She will be discharged home today with a Cryo/Cuff.  She  has a follow-up visit scheduled.  We will discharge her on Tylox,  Robaxin and Phenergan.      Vickki Hearing, M.D.  Electronically Signed     SEH/MEDQ  D:  07/11/2007  T:  07/12/2007  Job:  [id number]

## 2010-12-08 NOTE — H&P (Signed)
NAMECHERALYN, OLIVER                 ACCOUNT NO.:  [id number]   MEDICAL RECORD NO.:  [id number]          PATIENT TYPE:  AMB   LOCATION:  DAY                           FACILITY:  [facility]   PHYSICIAN:  Vickki Hearing, M.D.DATE OF BIRTH:  18-Feb-1971   DATE OF ADMISSION:  DATE OF DISCHARGE:  LH                                HISTORY & PHYSICAL   CHIEF COMPLAINT:  Pain and paresthesias right upper extremity.   Deira is a 40 year old female with pain and paraesthesia of the right upper  extremity.  She complains of occasional weakness in the right upper  extremity as well, decreased sensation, and night pain.  Symptoms have been  present for over 6 months.   REVIEW OF SYSTEMS:  Fatigue, cough, numbness, joint pain, depression, mood  swing, anxiety, panic attacks, hoarseness.  Denies chest pain, vomiting,  nausea, diarrhea, hematuria, endocrinopathies, skin disease, seasonal  allergies.   Allergic to AMOXICILLIN.  She has hypertension, anxiety attack, panic  attacks.  She has had surgery on her back, gallbladder, appendix.  She had  tubal ligation.  She had oral surgery.  She had a partial mastectomy.  She  is status post left carpal tunnel release May 04, 2005, did well.   PHYSICAL EXAMINATION:  VITAL SIGNS:  Weight is 302, pulse 66, respiratory  rate 18.  APPEARANCE:  Normal.  Grooming and hygiene, nutrition normal.  Body habitus  endomorphic.  PSYCHIATRIC:  Alert and oriented x3.  NEUROLOGIC:  Decreased sensation median nerve distribution right upper  extremity.  CARDIOVASCULAR:  Pulses are normal, no venous stasis.  Temperature is  normal, no edema.  SKIN:  Normal.  Incision left palm is normal.  MUSCULOSKELETAL:  Range of motion, strength, stability, and alignment are  normal in the right upper extremity.  There is tenderness over the carpal  tunnel, positive Phalen's test, positive Tinel's test over the carpal  tunnel.  CHEST:  Clear.  HEART:  Rate and rhythm normal.  ABDOMEN:  Soft.   DIAGNOSIS:  Right carpal tunnel syndrome.   PLAN:  Right carpal tunnel release.      Vickki Hearing, M.D.  Electronically Signed     SEH/MEDQ  D:  02/25/2006  T:  02/25/2006  Job:  [id number]   cc:   [facility]

## 2010-12-08 NOTE — H&P (Signed)
[facility]  Patient:    Brenda Taylor, Brenda Taylor                          MRN: [id number] Adm. Date:  03/11/00 Attending:  Stefani Dama, M.D.                         History and Physical  ADMISSION DIAGNOSIS:  Herniated nucleus pulposus, L4-5, left.  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  On August 8, while twisting, she developed severe pain in the low back with radiation into the left buttock, left hip and left leg such that she began to develop numbness. The pain has become quite severe and excruciating.  She was seen in the emergency room and evaluated by Dr. Kemper Durie.  She has not been able to get comfortable.  An MRI was performed demonstrating a herniated nucleus pulposus at L4-5 on the left side with also significant disk degeneration at the L5-S1 level.  Bladder control has not been affected.  The patient had been on a course of steroid medication for a little over a weeks time and notes that despite this she continues to have severe pain unrelieved by Lorcet and other narcotic analgesics.  The patient has been sleeping in an awkward position, basically on all fours with her knees bent slightly which is the only way she can get some temporary relief.  She has had to rest on her elbows in an effort to try to get some relief.  PAST MEDICAL HISTORY:  Reveals the patient has a significant history of anxiety in addition to obesity.  She is treated for these problems by Dr. Metta Clines.  CURRENT MEDICATIONS: 1. Meridia 10 mg a day for depression and obesity. 2. Xanax 0.5 mg three times a day for anxiety. 3. Lorcet Plus for pain. 4. Prednisone dosepack.  PAST SURGICAL HISTORY:  She has had previous surgery including a cholecystectomy, appendectomy, tubal ligation and oral surgery.  ALLERGIES:  No known allergies to any medications.  SOCIAL HISTORY:  She  smokes about a pack of cigarettes a day since she has been 40 years old.  She does not drink alcohol.  There is chronic weight gain and currently she states her weight at 269 pounds and 5 feet 8 inches.  FAMILY HISTORY:  Reveals that her mother has heart disease, diabetes and lung disease.  Father is age 4 and his health status is unknown.  REVIEW OF SYSTEMS:  Notable for leg pain while walking, shortness of breath, leg weakness, back pain, difficulty with coordination in the arms and legs, anxiety and depression.  A 14-point data sheet was reviewed with the patient.  PHYSICAL EXAMINATION:  GENERAL:  Reveals that she is an alert, oriented, moderately obese individual in significant distress with back and leg pain.  She will stand straight and erect with some difficulty.  Palpation of her back reproduces no significant tenderness.  Palpation over the gluteus on the left side reproduces some modest tenderness.  The patient is a morbidly obese individual.  NEUROLOGIC:  Motor strength in the lower extremities revealed the iliopsoas and quadriceps to be off center and gastrocnemius have good strength, tone and bulk to confrontation.  Deep tendon reflexes are 2+ in the patellae and 1+ in the Achilles and the Babinski is downgoing.  Sensation is intact  to pin and vibration of distal lower extremities.  Straight leg raising is markedly positive on the left side.  The patient has great difficulty even getting into the supine position with the left lower extremity extended for any length of time.  Her upper extremity strength and reflexes are normal. Cranial nerve examination reveals that the pupils are 4 mm, briskly reactive to light and accommodation, extraocular movements are full with face symmetric to grimace; tongue and uvula are in the midline.  Sclerae and conjunctivae are clear.  NECK:  Reveals no masses and no bruits are heard.  VITAL SIGNS:  Blood pressure 130/86, heart rate 66  and regular, respirations 14.  GENERAL:  The patient is a morbidly obese individual.  LUNGS:  Clear to auscultation.  HEART:  Regular rate and rhythm.  ABDOMEN:  Soft, protuberant, bowel sounds are positive.  No masses are palpable.  EXTREMITIES:  Reveal no clubbing, cyanosis, or edema.  IMPRESSION/PLAN:  The patient has a large herniated nucleus pulposus with an extruded fragment at L4-5 eccentric to the left.  She is now being admitted to undergo surgical microendoscopic diskectomy. DD:  03/11/00 TD:  03/11/00 Job: [id number]

## 2010-12-08 NOTE — H&P (Signed)
Brenda Taylor, Brenda Taylor                 ACCOUNT NO.:  [id number]   MEDICAL RECORD NO.:  [id number]          PATIENT TYPE:  amb   LOCATION:                                FACILITY:  [facility]   PHYSICIAN:  Vickki Hearing, M.D.DATE OF BIRTH:  12-03-1970   DATE OF ADMISSION:  DATE OF DISCHARGE:  LH                                HISTORY & PHYSICAL   CHIEF COMPLAINT:  Pain and paresthesias to the left upper extremity.   HISTORY:  This is a 40 year old white female presented with two months of  left elbow pain associated with numbness and tingling of her fingers as well  as some lateral elbow pain which retreated with an injection and she did  well from that.  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  Her fingers and hands, she reveals that she has moderate  constant dull aching pain associated with some numbness and tingling.   Her review of systems and past family and social history really are notable  for depression, mood swings, anxiety, and panic attacks, fatigue, and  occasional cough, otherwise her other systems are essentially normal.   ALLERGIES:  AMOXICILLIN.   She had a history of:  1.  Back pain.  2.  Hypertension.  3.  Anxiety attacks.  4.  Panic attacks.  5.  Previous back surgery.  6.  Cholecystectomy.  7.  Appendectomy.  8.  Tubal ligation.  9.  Oral surgery.  10. Partial mastectomy ??   [facility] is the known pharmacy.   MEDICATIONS:  1.  Verapamil 240 mg.  2.  Hydrochlorothiazide 25 mg.  3.  Alprazolam 0.5 mg.  4.  Tylox 5/500.  5.  Aspirin.   FAMILY HISTORY:  Heart disease, lung disease, cancer, asthma, diabetes.   FAMILY PHYSICIAN:  Patrica Duel, M.D.   She is married.  She is a Conservation officer, nature.  She smokes one pack of cigarettes per  day.  Does not drink.  Caffeine use reported is yes.  Highest grade  completed in school grade 9.   PHYSICAL  EXAMINATION:  VITAL SIGNS:  Weight 302, pulse 66, respiratory rate  is 18.  GENERAL:  She has normal development, grooming, hygiene, and nutrition.  Body habitus is large.  NEUROLOGIC:  She is alert and oriented x3 with normal sensation except for  upper extremities.  Her entire hand shows decreased sensation to soft touch  and to pinprick involves the median and ulnar nerves.  She has a positive  Tinel's at the left elbow medially and a positive compression test to both  carpal tunnels.  Positive flexion test as well.  CARDIOVASCULAR:  Shows normal pulses.  No venous stasis.  No temperature  changes.  No edema.  SKIN:  Otherwise normal without rashes.  UPPER EXTREMITIES:  Primarily the left shows normal stability and alignment.  There is some tenderness over the carpal tunnel.  The hands show fullness  but no real detectable swelling.  She has normal  range of motion, perhaps  some weakness in grip strength.  Right upper extremity exam is similar but  not as symptomatic.  LOWER EXTREMITIES:  The range of motion, strength, stability, and alignment  are normal.   X-rays of the elbow were done because of her elbow pain and they were  normal.  She was treated for that with an injection for lateral  epicondylitis and that is now asymptomatic.   Her nerve conduction studies revealed a left carpal tunnel syndrome, right  carpal tunnel syndrome, left and right ulnar neuropathies.  __________  neuropathies are at the elbow.   DIAGNOSIS PERTAINING TO THIS SURGERY:  Left carpal tunnel syndrome.   She is for left carpal tunnel release.      Vickki Hearing, M.D.  Electronically Signed     SEH/MEDQ  D:  04/25/2005  T:  04/25/2005  Job:  [id number]

## 2010-12-08 NOTE — Op Note (Signed)
[facility]. [facility]  Patient:    Brenda Taylor, Brenda Taylor                          MRN: [id number] Proc. Date: 03/11/00 Adm. Date:  [id number] Disc. Date: [id number] Attending:  Jonne Ply                           Operative Report  PREOPERATIVE DIAGNOSIS:  Herniated nucleus pulposus, L4-5, left, with left lumbar radiculopathy.  POSTOPERATIVE DIAGNOSIS:  Herniated nucleus pulposus, L4-5, left with left lumbar radiculopathy.  PROCEDURES:  Lumbar laminectomy and microendoscopic diskectomy, L4-5, left, with operative microscope and microsurgical dissection technique.  SURGEON:  Stefani Dama, M.D.  ASSISTANT:  ANESTHESIA:  General endotracheal.  INDICATIONS:  The patient is a 40 year old individual who has had back and left leg pain secondary to a herniated nucleus pulposus at L4-5 on the left. She has failed oral prednisone and bed rest for the past two weeks time and she has been advised regarding surgical microdiskectomy.  DESCRIPTION OF PROCEDURE:  The patient was brought to the operating room, supine on the stretcher.  After the smooth induction of general endotracheal anesthesia, she was turned prone.  The back was shaved and prepped with DuraPrep and draped in a sterile fashion.  An incision was made with a #15 blade after the L4-5 space was localized with an image intensifier.  A K-wire was passed down to the upper arch of the lamina at L4 and then a series of probes were passed to this area and the soft tissues were dissected using a wanding technique.  Ultimately an 18 mm wide cannula was inserted down to the level of the laminar of L4 and at the interlaminar space of L4-5 on the left side.  When the area was localized well radiographically, the cannula was locked to the table.  The soft tissues were dissected with the monopolar cautery and then a Midas Rex and an A2 bur were used to remove the inferior marginal lamina of L4 out to the mesial  wall of the facet.  The yellow ligament was then taken up with a 2 and 3 mm Kerrison punch.  The common dural tube was identified.  The microscope was used during this portion of the procedure to facilitate observation through a 7 cm deep 18 mm diameter cannula.  The common dural tube was then retracted medially.  The L5 nerve root was noted to tensely adherent laterally and this required some work and some microdissection technique to separate some epidural veins in this area. Once the area was isolated, the underlying surface was noted to have a free fragment of disk material underneath the nerve root.  A large portion of this was removed in a single fragment.  Several other fragments were removed after completing dissection of this area using a combination of curettes and rongeurs.  The epidural veins in this area bled profusely.  They were controlled with some pledgets of Gelfoam soaked in thrombin, which were later removed.  Once adequate visualization of the all of the areas was obtained, all of the fragments of disk that could be palpated and probed through this opening were removed.  The common dural tube and the L5 nerve root were felt to be free and clear in its travel out of the foramen.  Then with hemostasis being doubly checked, the area was copiously irrigated with antibiotic irrigating  solution, the cannula was removed, and 3-0 Vicryl suture was used in the subcuticular tissue to close the subcuticular space.  The patient tolerated the procedure well and was returned to the recovery room in stable condition. DD:  03/11/00 TD:  03/12/00 Job: [id number]

## 2010-12-08 NOTE — Op Note (Signed)
NAMEHAILLE, PARDI                 ACCOUNT NO.:  [id number]   MEDICAL RECORD NO.:  [id number]          PATIENT TYPE:  AMB   LOCATION:  DAY                           FACILITY:  [facility]   PHYSICIAN:  Vickki Hearing, M.D.DATE OF BIRTH:  1971-07-17   DATE OF PROCEDURE:  02/26/2006  DATE OF DISCHARGE:                                 OPERATIVE REPORT   HISTORY:  A 40 year old female with chronic right carpal tunnel syndrome,  failed nonoperative treatment, presented for carpal tunnel release.   PREOPERATIVE DIAGNOSIS:  Right carpal tunnel syndrome.   POSTOPERATIVE DIAGNOSIS:  Right carpal tunnel syndrome.   PROCEDURE:  Was open right carpal tunnel release.   OPERATIVE FINDINGS:  Compressed and stenotic carpal tunnel.   SURGEON:  Fuller Canada, M.D.   ANESTHETIC:  Bier block.   ASSISTANT:  None.   COUNTS:  Correct.   COMPLICATIONS:  None.   SPECIMENS:  None.   On arrival to PACU stable condition.   DETAILS OF PROCEDURE:  The patient was identified as Dewitt Rota.  Her right  upper extremity was marked as the surgical site over the carpal tunnel,  countersigned by the surgeon.  History and physical updated, antibiotic  started within an hour of skin incision.   She is taken to the operating room.  She had a Bier block.  After successful  Bier block, an incision was made over the carpal tunnel subcutaneous tissue  was divided.  Palmar fascia was divided bluntly until the distal part of the  carpal tunnel was identified.  Blunt dissection was carried out beneath the  transverse carpal ligament which was  released.  Carpal tunnel contents were inspected.  There were no space-  occupying lesions.  The wound was irrigated and closed with 3-0 nylon suture  and we injected Marcaine plain into the subcu skin for postop pain  management.   The patient is discharged to home.  Follow-up 2 days, keep clean, dry and  elevated.  We gave her Tylox for pain.      Vickki Hearing, M.D.  Electronically Signed     SEH/MEDQ  D:  02/26/2006  T:  02/26/2006  Job:  [id number]

## 2010-12-08 NOTE — Op Note (Signed)
NAMEVENICE, MARCUCCI                 ACCOUNT NO.:  [id number]   MEDICAL RECORD NO.:  [id number]          PATIENT TYPE:  AMB   LOCATION:  DAY                           FACILITY:  [facility]   PHYSICIAN:  Vickki Hearing, M.D.DATE OF BIRTH:  1971/06/10   DATE OF PROCEDURE:  05/04/2005  DATE OF DISCHARGE:                                 OPERATIVE REPORT   PREOPERATIVE DIAGNOSIS:  Carpal tunnel syndrome, left.   POSTOPERATIVE DIAGNOSIS:  Carpal tunnel syndrome, left.   PROCEDURE:  Open left carpal tunnel release.   SURGEON:  Dr. Romeo Apple.   ANESTHETIC:  Bier block.   FINDINGS:  Tight carpal tunnel.   PROCEDURE:  The patient was identified in the preop holding area as Brenda Taylor. Her left wrist was marked as the surgical site, countersigned by the  surgeon. History and physical were obtained. Antibiotics were given. She was  taken to the operating room. Bier block was established. After good  anesthetic, left upper extremity was prepped and draped using sterile  technique. Time-out was taken by protocol and completed.   A carpal tunnel incision was made volarly in line with the radial border of  the ring finger, taken down to the palmar fascia. Palmar fascia was divided  until the transverse carpal ligament identified distally and proximally, and  blunt dissection was carried out beneath the ligament, and ligament was  released. Wound was irrigated. Carpal tunnel contents were inspected. There  were no space occupying lesions to suggest tightness of the transverse  carpal ligament. Of note, there was some synovial thickening around the  nerve.   Wound was irrigated again and closed with 3-0 nylon suture, infiltrated the  subcu tissue with 10 cc of plain 0.5% Marcaine. We placed sterile dressings,  released the tourniquet, fingers were viable. The patient was taken to  recovery in stable condition to follow-up within the next week. She is  discharged on Tylox for pain.      Vickki Hearing, M.D.  Electronically Signed     SEH/MEDQ  D:  05/04/2005  T:  05/04/2005  Job:  [id number]

## 2011-04-13 LAB — COMPREHENSIVE METABOLIC PANEL
ALT: 31
AST: 18
Albumin: 3.9
Alkaline Phosphatase: 58
BUN: 14
CO2: 27
Calcium: 9.2
Chloride: 99
Creatinine, Ser: 0.58
GFR calc Af Amer: >60
GFR calc non Af Amer: >60
Glucose, Bld: 84
Potassium: 2.9 — ABNORMAL LOW
Sodium: 136
Total Bilirubin: 0.6
Total Protein: 7.3

## 2011-04-13 LAB — CBC
HCT: 40.4
Hemoglobin: 13.6
MCHC: 33.7
MCV: 84.1
Platelets: 320
RBC: 4.81
RDW: 15.1
WBC: 14.6 — ABNORMAL HIGH

## 2011-04-13 LAB — URINALYSIS, ROUTINE W REFLEX MICROSCOPIC
Bilirubin Urine: NEGATIVE
Glucose, UA: NEGATIVE
Hgb urine dipstick: NEGATIVE
Ketones, ur: NEGATIVE
Nitrite: NEGATIVE
Protein, ur: NEGATIVE
Specific Gravity, Urine: 1.025
Urobilinogen, UA: 0.2
pH: 5

## 2011-04-13 LAB — PREGNANCY, URINE: Preg Test, Ur: NEGATIVE

## 2011-04-27 LAB — BASIC METABOLIC PANEL
BUN: 10
CO2: 25
Calcium: 9.1
Chloride: 103
Creatinine, Ser: 0.56
GFR calc Af Amer: >60
GFR calc non Af Amer: >60
Glucose, Bld: 127 — ABNORMAL HIGH
Potassium: 3.1 — ABNORMAL LOW
Sodium: 137

## 2011-04-27 LAB — POCT I-STAT 4, (NA,K, GLUC, HGB,HCT)
Glucose, Bld: 122 — ABNORMAL HIGH
HCT: 40
Hemoglobin: 13.6
Operator id: 216531
Potassium: 3.9
Sodium: 140

## 2011-04-27 LAB — CBC
HCT: 36
Hemoglobin: 12
MCHC: 33.4
MCV: 84.2
Platelets: 319
RBC: 4.28
RDW: 14.8
WBC: 10.6 — ABNORMAL HIGH

## 2011-04-27 LAB — HCG, QUANTITATIVE, PREGNANCY: hCG, Beta Chain, Quant, S: 2

## 2011-05-04 LAB — CBC
HCT: 36.2
Hemoglobin: 12.5
MCHC: 34.4
MCV: 84.8
Platelets: 363
RBC: 4.28
RDW: 14.4 — ABNORMAL HIGH
WBC: 20.8 — ABNORMAL HIGH

## 2011-05-04 LAB — BASIC METABOLIC PANEL
BUN: 15
CO2: 24
Calcium: 9.1
Chloride: 104
Creatinine, Ser: 0.58
GFR calc Af Amer: >60
GFR calc non Af Amer: >60
Glucose, Bld: 125 — ABNORMAL HIGH
Potassium: 3.4 — ABNORMAL LOW
Sodium: 136

## 2011-09-14 ENCOUNTER — Other Ambulatory Visit (HOSPITAL_COMMUNITY): Payer: Self-pay | Admitting: Family Medicine

## 2011-09-14 DIAGNOSIS — Z139 Encounter for screening, unspecified: Secondary | ICD-10-CM

## 2011-09-18 ENCOUNTER — Ambulatory Visit (HOSPITAL_COMMUNITY): Payer: Self-pay

## 2011-09-20 ENCOUNTER — Ambulatory Visit (HOSPITAL_COMMUNITY)
Admission: RE | Admit: 2011-09-20 | Discharge: 2011-09-20 | Disposition: A | Discharge status: Home / Self Care | Payer: Medicaid Other | Source: Ambulatory Visit | Attending: Family Medicine | Admitting: Family Medicine

## 2011-09-20 DIAGNOSIS — Z139 Encounter for screening, unspecified: Secondary | ICD-10-CM

## 2011-09-20 DIAGNOSIS — Z1231 Encounter for screening mammogram for malignant neoplasm of breast: Secondary | ICD-10-CM | POA: Insufficient documentation

## 2011-10-09 ENCOUNTER — Ambulatory Visit (INDEPENDENT_AMBULATORY_CARE_PROVIDER_SITE_OTHER): Payer: Medicaid Other | Admitting: Orthopedic Surgery

## 2011-10-09 ENCOUNTER — Encounter: Payer: Self-pay | Admitting: Orthopedic Surgery

## 2011-10-09 VITALS — BP 110/70 | Ht 66.0 in | Wt 290.0 lb

## 2011-10-09 DIAGNOSIS — M75102 Unspecified rotator cuff tear or rupture of left shoulder, not specified as traumatic: Secondary | ICD-10-CM

## 2011-10-09 DIAGNOSIS — M67919 Unspecified disorder of synovium and tendon, unspecified shoulder: Secondary | ICD-10-CM

## 2011-10-09 HISTORY — DX: Unspecified rotator cuff tear or rupture of left shoulder, not specified as traumatic: M75.102

## 2011-10-09 NOTE — Progress Notes (Signed)
  Subjective:    Brenda Taylor is a 41 y.o. female who presents with new-onset LEFT shoulder pain atraumatic present for 2 months associated with inability The LEFT side loss of motion and locking.  She complains of dull stabbing burning pain which is constant associated with numbness.  Her pain is 9/10 despite taking OxyContin for chronic pain and meloxicam.  Locks it can did give her some mild symptomatic relief.  Oxycodone doses 10 mg every 4-6 hours.  That is for chronic pain.  Review of systems anxiety and depression otherwise negative   The following portions of the patient's history were reviewed and updated as appropriate: allergies, current medications, past family history, past medical history, past social history, past surgical history and problem list.  Review of Systems Pertinent items are noted in HPI.   Objective:    BP 110/70  Ht 5\' 6"  (1.676 m)  Wt 290 lb (131.543 kg)  BMI 46.81 kg/m2  LMP 09/19/2011  Vital signs are stable as recorded  General appearance is normal  The patient is alert and oriented x3  The patient's mood and affect are normal  Gait assessment: unremarkable unrelated and The cardiovascular exam reveals normal pulses and temperature without edema swelling.  The lymphatic system is negative for palpable lymph nodes  The sensory exam is normal.  There are no pathologic reflexes.  Balance is normal.   Exam of the cervical spine Inspection there is a large hump at the posterior aspect of the cervical spine, physiologic. Range of motion cervical spine normal Stability no instability Strength increase muscle tension on the LEFT including the trapezius Skin normal cervical spine  LEFT shoulder tenderness around the anterolateral acromion and deltoid.  Active range of motion abduction 70 forward elevation 80 external rotation 30  Passive range of motion forward elevation 120 painful arc of motion 90-120.  Positive impingement sign by  Neer criteria  No weakness in rotator cuff shoulder is stable  X-rays were done here if they show no abnormalities  Impression rotator cuff syndrome.  Recommend injection and physical therapy and a 6 week followup.  Continue meloxicam.  Subacromial Shoulder Injection Procedure Note  Pre-operative Diagnosis: left RC Syndrome  Post-operative Diagnosis: same  Indications: pain   Anesthesia: ethyl chloride   Procedure Details   Verbal consent was obtained for the procedure. The shoulder was prepped withalcohol and the skin was anesthetized. A 20 gauge needle was advanced into the subacromial space through posterior approach without difficulty  The space was then injected with 3 ml 1% lidocaine and 1 ml of depomedrol. The injection site was cleansed with isopropyl alcohol and a dressing was applied.  Complications:  None; patient tolerated the procedure well.

## 2011-10-09 NOTE — Patient Instructions (Addendum)
You have received a steroid shot. 15% of patients experience increased pain at the injection site with in the next 24 hours. This is best treated with ice and tylenol extra strength 2 tabs every 8 hours. If you are still having pain please call the office.    

## 2011-10-09 NOTE — Progress Notes (Signed)
Separately identifiable x-ray report AP and lateral   Findings LEFT shoulder exam normal glenohumeral joint type I acromion  Impression Normal shoulder

## 2011-10-15 ENCOUNTER — Ambulatory Visit (HOSPITAL_COMMUNITY)
Admission: RE | Admit: 2011-10-15 | Discharge: 2011-10-15 | Disposition: A | Discharge status: Home / Self Care | Payer: Medicaid Other | Source: Ambulatory Visit | Attending: Orthopedic Surgery | Admitting: Orthopedic Surgery

## 2011-10-15 DIAGNOSIS — M6281 Muscle weakness (generalized): Secondary | ICD-10-CM | POA: Insufficient documentation

## 2011-10-15 DIAGNOSIS — M25519 Pain in unspecified shoulder: Secondary | ICD-10-CM | POA: Insufficient documentation

## 2011-10-15 DIAGNOSIS — M25619 Stiffness of unspecified shoulder, not elsewhere classified: Secondary | ICD-10-CM | POA: Insufficient documentation

## 2011-10-15 DIAGNOSIS — IMO0001 Reserved for inherently not codable concepts without codable children: Secondary | ICD-10-CM | POA: Insufficient documentation

## 2011-10-15 NOTE — Patient Instructions (Addendum)
given

## 2011-10-15 NOTE — Evaluation (Addendum)
Physical Therapy Evaluation  Patient Details  Name: Brenda Taylor MRN: [id number] Date of Birth: [birth year]  Today's Date: 10/15/2011 Time: 4098-1191 Time Calculation (min): 33 min  Visit#: 1  of 12   Re-eval: 11/14/11    Past Medical History:  Past Medical History  Diagnosis Date  . HBP (high blood pressure)   . DDD (degenerative disc disease)   . Ulcer disease   . Hernia   . Chronic pain    Past Surgical History:  Past Surgical History  Procedure Date  . Mastectomy, partial     x 2  . Cholecystectomy   . Appendectomy   . Carpal tunnel release     bilateral  . Back surgery     x 4  . Rotator cuff repair     right  . Tubal ligation   . Mouth surgery     Subjective    INITIAL EVALUATION  Physical Therapy     Patient Name: Brenda Taylor Date Of Birth: 11-10-1970  Guardian Name: N/A Treatment ICD-9 Code: 47829  Address: [address] Date of Evaluation: 10/15/2011  [place], [place] [address] Requested Dates of Service: 10/18/2011 - 11/29/2011       Therapy History: No known therapy for this problem  Reason For Referral: Recipient has a new injury, disease or condition  Prior Level of Function: Independent/Modified Independent with all ADLs (OT/PT) or Audition, Communication, Voice and/or Swallowing Skills (ST/AUD)  Additional Medical History: Brenda Taylor states that her L shoulder started to have L shoulder pain approximately two months ago. She states she had rotator cuff surgery on her right shoulder about six years ago and it feels very similar. The patient received a shot that gave her about six hours of relief but now the pain is back. It is not a constant pain but when it starts it is very uncomfortable. She states the pain goes from her shoulder to her elbow. She is right handed . The patient states she is not taking any medication at this time. She states that she is unable to lift her arm up even to shoulder height. She is having difficulty washing hair, She states she  has no strength in her hand. She is being referred to PT to improve her functional ability.  Prematurity: N/A  Severity Level: N/A       Treatment Goals:  1. Goal: Pain level to decrease by 4 levels to allow pt to sleep through the night.  Baseline: Pain right now is a 7/10; least is a 0/10; 10/10  Duration: 4 Week(s)  2. Goal: Pt to be I in ex  Baseline: not completing any ex  Duration: 2 Week(s)  3. Goal: Pt to be able to reach above shoulder height to bring down dishes in and out of the cabinet.  Baseline: Not attempting-daughters doing these tasks right now. Supine ROM flex 65 ; AB 45; ER 30  Duration: 6 Week(s)  4. Goal: strength to increase by one grade; hand grip increased by 10#  Baseline: R 75# L 25#; L shoulder mm 2+/5  Duration: 6 Week(s)  Goal: Pt to verbalize the importance of posture  Baseline: Pt unaware of the imprtance of good posture in shoulder motion  Duration: 1 Week(s)         Treatment Frequency/Duration:  3x/week for 6 weeks  Units per visit: N/A    Additional Information: N/A           Building surveyor  Date Physician Signature  Date    Donnamae Jude       Therapist Name  Physician Name   Refer to the Review Status page for current case status     Supine- attempted wand exercises but pt stated they were to painful.   Seated- scapular retraction, ER B x 10   Standing- completed pendulum ex x 5; given t-putty for strengthening    UEFI 33/80   Physical Therapy Assessment and Plan PT Assessment and Plan Clinical Impression Statement: pain, stiffness decrease strength affecting ability to complete normal ADL:S Rehab Potential: Good PT Frequency: Min 2X/week PT Duration: 6 weeks PT Treatment/Interventions: Therapeutic exercise;Patient/family education (modalities as needed)        Problem List Patient Active Problem List  Diagnoses  . ANEMIA  . HEMOCCULT POSITIVE STOOL  . SHOULDER PAIN, RIGHT  . ABDOMINAL PAIN, GENERALIZED  .  HIGH BLOOD PRESSURE  . Rotator cuff syndrome of left shoulder    PT - End of Session Activity Tolerance: Patient tolerated treatment well General Behavior During Session: [facility] for tasks performed Cognition: [facility] for tasks performed    Henry Utsey,CINDY 10/15/2011, 2:43 PM  Physician Documentation Your signature is required to indicate approval of the treatment plan as stated above.  Please sign and either send electronically or make a copy of this report for your files and return this physician signed original.   Please mark one 1.__approve of plan  2. ___approve of plan with the following conditions.   ______________________________                                                          _____________________ Physician Signature                                                                                                             Date

## 2011-10-17 ENCOUNTER — Inpatient Hospital Stay (HOSPITAL_COMMUNITY): Admission: RE | Admit: 2011-10-17 | Payer: Medicaid Other | Source: Ambulatory Visit | Admitting: Physical Therapy

## 2011-10-29 ENCOUNTER — Ambulatory Visit: Payer: Medicaid Other | Admitting: Orthopedic Surgery

## 2011-11-20 ENCOUNTER — Ambulatory Visit: Payer: Medicaid Other | Admitting: Orthopedic Surgery

## 2011-11-21 ENCOUNTER — Encounter: Payer: Self-pay | Admitting: Orthopedic Surgery

## 2012-08-20 ENCOUNTER — Emergency Department (HOSPITAL_COMMUNITY)
Admission: EM | Admit: 2012-08-20 | Discharge: 2012-08-20 | Disposition: A | Discharge status: Home / Self Care | Payer: BC Managed Care – PPO | Attending: Emergency Medicine | Admitting: Emergency Medicine

## 2012-08-20 ENCOUNTER — Encounter (HOSPITAL_COMMUNITY): Payer: Self-pay | Admitting: *Deleted

## 2012-08-20 ENCOUNTER — Emergency Department (HOSPITAL_COMMUNITY): Payer: BC Managed Care – PPO

## 2012-08-20 DIAGNOSIS — R509 Fever, unspecified: Secondary | ICD-10-CM | POA: Insufficient documentation

## 2012-08-20 DIAGNOSIS — J111 Influenza due to unidentified influenza virus with other respiratory manifestations: Secondary | ICD-10-CM

## 2012-08-20 DIAGNOSIS — R05 Cough: Secondary | ICD-10-CM | POA: Insufficient documentation

## 2012-08-20 DIAGNOSIS — Z8719 Personal history of other diseases of the digestive system: Secondary | ICD-10-CM | POA: Insufficient documentation

## 2012-08-20 DIAGNOSIS — Z872 Personal history of diseases of the skin and subcutaneous tissue: Secondary | ICD-10-CM | POA: Insufficient documentation

## 2012-08-20 DIAGNOSIS — F411 Generalized anxiety disorder: Secondary | ICD-10-CM | POA: Insufficient documentation

## 2012-08-20 DIAGNOSIS — F17200 Nicotine dependence, unspecified, uncomplicated: Secondary | ICD-10-CM | POA: Insufficient documentation

## 2012-08-20 DIAGNOSIS — Z79899 Other long term (current) drug therapy: Secondary | ICD-10-CM | POA: Insufficient documentation

## 2012-08-20 DIAGNOSIS — Z8739 Personal history of other diseases of the musculoskeletal system and connective tissue: Secondary | ICD-10-CM | POA: Insufficient documentation

## 2012-08-20 DIAGNOSIS — R0602 Shortness of breath: Secondary | ICD-10-CM | POA: Insufficient documentation

## 2012-08-20 DIAGNOSIS — R059 Cough, unspecified: Secondary | ICD-10-CM | POA: Insufficient documentation

## 2012-08-20 HISTORY — DX: Anxiety disorder, unspecified: F41.9

## 2012-08-20 LAB — CBC WITH DIFFERENTIAL/PLATELET
Basophils Absolute: 0 10*3/uL (ref 0.0–0.1)
Basophils Relative: 0 % (ref 0–1)
Eosinophils Absolute: 0.1 10*3/uL (ref 0.0–0.7)
Eosinophils Relative: 1 % (ref 0–5)
HCT: 34.6 % — ABNORMAL LOW (ref 36.0–46.0)
Hemoglobin: 11.6 g/dL — ABNORMAL LOW (ref 12.0–15.0)
Lymphocytes Relative: 15 % (ref 12–46)
Lymphs Abs: 1.1 10*3/uL (ref 0.7–4.0)
MCH: 28.5 pg (ref 26.0–34.0)
MCHC: 33.5 g/dL (ref 30.0–36.0)
MCV: 85 fL (ref 78.0–100.0)
Monocytes Absolute: 0.8 10*3/uL (ref 0.1–1.0)
Monocytes Relative: 10 % (ref 3–12)
Neutro Abs: 5.7 10*3/uL (ref 1.7–7.7)
Neutrophils Relative %: 74 % (ref 43–77)
Platelets: 261 10*3/uL (ref 150–400)
RBC: 4.07 MIL/uL (ref 3.87–5.11)
RDW: 14.3 % (ref 11.5–15.5)
WBC: 7.8 10*3/uL (ref 4.0–10.5)

## 2012-08-20 LAB — COMPREHENSIVE METABOLIC PANEL
ALT: 24 U/L (ref 0–35)
AST: 22 U/L (ref 0–37)
Albumin: 3.6 g/dL (ref 3.5–5.2)
Alkaline Phosphatase: 72 U/L (ref 39–117)
BUN: 12 mg/dL (ref 6–23)
CO2: 24 mEq/L (ref 19–32)
Calcium: 9.1 mg/dL (ref 8.4–10.5)
Chloride: 100 mEq/L (ref 96–112)
Creatinine, Ser: 0.59 mg/dL (ref 0.50–1.10)
GFR calc Af Amer: >90 mL/min (ref >90)
GFR calc non Af Amer: >90 mL/min (ref >90)
Glucose, Bld: 114 mg/dL — ABNORMAL HIGH (ref 70–99)
Potassium: 3.5 mEq/L (ref 3.5–5.1)
Sodium: 135 mEq/L (ref 135–145)
Total Bilirubin: 0.2 mg/dL — ABNORMAL LOW (ref 0.3–1.2)
Total Protein: 7.5 g/dL (ref 6.0–8.3)

## 2012-08-20 MED ORDER — ONDANSETRON HCL 4 MG/2ML IJ SOLN
4.0000 mg | Freq: Once | INTRAMUSCULAR | Status: AC
  Administered 2012-08-20: 4 mg via INTRAVENOUS
  Filled 2012-08-20: qty 2

## 2012-08-20 MED ORDER — SODIUM CHLORIDE 0.9 % IV BOLUS (SEPSIS)
1000.0000 mL | Freq: Once | INTRAVENOUS | Status: AC
  Administered 2012-08-20: 1000 mL via INTRAVENOUS

## 2012-08-20 MED ORDER — OSELTAMIVIR PHOSPHATE 75 MG PO CAPS: 75.0000 mg | ORAL_CAPSULE | Freq: Two times a day (BID) | ORAL | Status: DC

## 2012-08-20 MED ORDER — OSELTAMIVIR PHOSPHATE 75 MG PO CAPS
75.0000 mg | ORAL_CAPSULE | Freq: Once | ORAL | Status: AC
  Administered 2012-08-20: 75 mg via ORAL
  Filled 2012-08-20: qty 1

## 2012-08-20 MED ORDER — KETOROLAC TROMETHAMINE 30 MG/ML IJ SOLN
30.0000 mg | Freq: Once | INTRAMUSCULAR | Status: AC
  Administered 2012-08-20: 30 mg via INTRAVENOUS
  Filled 2012-08-20: qty 1

## 2012-08-20 MED ORDER — OXYCODONE-ACETAMINOPHEN 5-325 MG PO TABS: 1.0000 | ORAL_TABLET | Freq: Four times a day (QID) | ORAL | Status: AC | PRN

## 2012-08-20 MED ORDER — OXYCODONE-ACETAMINOPHEN 5-325 MG PO TABS
1.0000 | ORAL_TABLET | Freq: Once | ORAL | Status: AC
  Administered 2012-08-20: 1 via ORAL
  Filled 2012-08-20: qty 1

## 2012-08-20 NOTE — ED Notes (Signed)
Pt with right chest pain x 30 minutes per pt, sob, fever and chills

## 2012-08-20 NOTE — ED Provider Notes (Signed)
History   This chart was scribed for Brenda Lennert, MD by Sofie Rower, ED Scribe. The patient was seen in room [facility]17/[facility]17 and the patient's care was started at 9:27PM.    CSN: [id number]  Arrival date & time 08/20/12  2037   First MD Initiated Contact with Patient 08/20/12 2127      Chief Complaint  Patient presents with  . Chest Pain  . Shortness of Breath  . Fever    (Consider location/radiation/quality/duration/timing/severity/associated sxs/prior treatment) Patient is a 42 y.o. female presenting with chest pain, shortness of breath, and fever. The history is provided by a relative and the patient. No language interpreter was used.  Chest Pain The chest pain began yesterday. Chest pain occurs constantly. The chest pain is unchanged. The pain is associated with coughing. The severity of the pain is moderate. The quality of the pain is described as aching. Chest pain is worsened by smoking. Primary symptoms include a fever, shortness of breath and cough. Pertinent negatives for primary symptoms include no fatigue, no abdominal pain and no vomiting.  The fever began yesterday. The fever has been gradually worsening since its onset. The maximum temperature recorded prior to her arrival was 100 to 100.9 F. The temperature was taken by an oral thermometer.  The shortness of breath began yesterday. The shortness of breath developed suddenly. The shortness of breath is mild.  The cough began yesterday. The cough is new. The cough is productive. It is exacerbated by smoking. Risk factors include smoking/tobacco exposure.  Her past medical history is significant for anxiety/panic attacks.  Pertinent negatives for past medical history include no seizures.    Shortness of Breath  The current episode started yesterday. The onset was sudden. The problem occurs rarely. The problem has been gradually worsening. The problem is mild. Nothing relieves the symptoms. The symptoms are aggravated by smoke  exposure. Associated symptoms include chest pain, a fever, cough and shortness of breath.  Fever Primary symptoms of the febrile illness include fever, cough and shortness of breath. Primary symptoms do not include fatigue, headaches, abdominal pain, vomiting, diarrhea or rash. The current episode started yesterday. This is a new problem. The problem has been gradually worsening.  The fever began yesterday. The fever has been gradually worsening since its onset. The maximum temperature recorded prior to her arrival was 100 to 100.9 F. The temperature was taken by an oral thermometer.  The cough began yesterday. The cough is new. The cough is productive. It is exacerbated by exposure to smoke.  The shortness of breath began yesterday. The shortness of breath developed suddenly. The shortness of breath is mild.    PCP Dr. Regino Schultze.  Past Medical History  Diagnosis Date  . HBP (high blood pressure)   . DDD (degenerative disc disease)   . Ulcer disease   . Hernia   . Chronic pain   . Anxiety     Past Surgical History  Procedure Date  . Mastectomy, partial     x 2  . Cholecystectomy   . Appendectomy   . Carpal tunnel release     bilateral  . Back surgery     x 4  . Rotator cuff repair     right  . Tubal ligation   . Mouth surgery     Family History  Problem Relation Age of Onset  . Heart disease    . Arthritis    . Cancer    . Asthma    . Diabetes  History  Substance Use Topics  . Smoking status: Current Every Day Smoker -- 1.0 packs/day    Types: Cigarettes  . Smokeless tobacco: Not on file  . Alcohol Use: No    OB History    Grav Para Term Preterm Abortions TAB SAB Ect Mult Living                  Review of Systems  Constitutional: Positive for fever. Negative for fatigue.  HENT: Negative for congestion, sinus pressure and ear discharge.   Eyes: Negative for discharge.  Respiratory: Positive for cough and shortness of breath.   Cardiovascular: Positive  for chest pain.  Gastrointestinal: Negative for vomiting, abdominal pain and diarrhea.  Genitourinary: Negative for frequency and hematuria.  Musculoskeletal: Negative for back pain.  Skin: Negative for rash.  Neurological: Negative for seizures and headaches.  Hematological: Negative.   Psychiatric/Behavioral: Negative for hallucinations.    Allergies  Amoxicillin  Home Medications   Current Outpatient Rx  Name  Route  Sig  Dispense  Refill  . XANAX PO   Oral   Take by mouth.         . MELOXICAM PO   Oral   Take by mouth.         . METOPROLOL TARTRATE PO   Oral   Take by mouth.         . OXYCODONE HCL PO   Oral   Take by mouth.           BP 135/72  Pulse 105  Temp 100.2 F (37.9 C) (Oral)  Resp 22  SpO2 97%  LMP 08/11/2012  Physical Exam  Nursing note and vitals reviewed. Constitutional: She is oriented to person, place, and time. She appears well-developed.  HENT:  Head: Normocephalic and atraumatic.  Eyes: Conjunctivae normal and EOM are normal. No scleral icterus.  Neck: Neck supple. No thyromegaly present.  Cardiovascular: Normal rate and regular rhythm.  Exam reveals no gallop and no friction rub.   No murmur heard. Pulmonary/Chest: Effort normal. No stridor. She has wheezes. She has no rales. She exhibits no tenderness.       Wheezing detected throughout all lung fields.   Abdominal: She exhibits no distension. There is generalized tenderness. There is no rebound.  Musculoskeletal: Normal range of motion. She exhibits no edema.  Lymphadenopathy:    She has no cervical adenopathy.  Neurological: She is oriented to person, place, and time. Coordination normal.  Skin: No rash noted. No erythema.  Psychiatric: She has a normal mood and affect. Her behavior is normal.    ED Course  Procedures (including critical care time)  DIAGNOSTIC STUDIES: Oxygen Saturation is 97% on room air, normal by my interpretation.    COORDINATION OF  CARE:  9:35 PM- Treatment plan discussed with patient. Pt agrees with treatment.    Results for orders placed during the hospital encounter of 08/20/12  CBC WITH DIFFERENTIAL      Component Value Range   WBC 7.8  4.0 - 10.5 K/uL   RBC 4.07  3.87 - 5.11 MIL/uL   Hemoglobin 11.6 (*) 12.0 - 15.0 g/dL   HCT 81.1 (*) 91.4 - 78.2 %   MCV 85.0  78.0 - 100.0 fL   MCH 28.5  26.0 - 34.0 pg   MCHC 33.5  30.0 - 36.0 g/dL   RDW 95.6  21.3 - 08.6 %   Platelets 261  150 - 400 K/uL   Neutrophils Relative 74  43 -  77 %   Neutro Abs 5.7  1.7 - 7.7 K/uL   Lymphocytes Relative 15  12 - 46 %   Lymphs Abs 1.1  0.7 - 4.0 K/uL   Monocytes Relative 10  3 - 12 %   Monocytes Absolute 0.8  0.1 - 1.0 K/uL   Eosinophils Relative 1  0 - 5 %   Eosinophils Absolute 0.1  0.0 - 0.7 K/uL   Basophils Relative 0  0 - 1 %   Basophils Absolute 0.0  0.0 - 0.1 K/uL  COMPREHENSIVE METABOLIC PANEL      Component Value Range   Sodium 135  135 - 145 mEq/L   Potassium 3.5  3.5 - 5.1 mEq/L   Chloride 100  96 - 112 mEq/L   CO2 24  19 - 32 mEq/L   Glucose, Bld 114 (*) 70 - 99 mg/dL   BUN 12  6 - 23 mg/dL   Creatinine, Ser 1.19  0.50 - 1.10 mg/dL   Calcium 9.1  8.4 - 14.7 mg/dL   Total Protein 7.5  6.0 - 8.3 g/dL   Albumin 3.6  3.5 - 5.2 g/dL   AST 22  0 - 37 U/L   ALT 24  0 - 35 U/L   Alkaline Phosphatase 72  39 - 117 U/L   Total Bilirubin 0.2 (*) 0.3 - 1.2 mg/dL   GFR calc non Af Amer >90  >90 mL/min   GFR calc Af Amer >90  >90 mL/min   Dg Chest Portable 1 View  08/20/2012  *RADIOLOGY REPORT*  Clinical Data: Right-sided chest pain.  Short of breath.  Fever.  PORTABLE CHEST - 1 VIEW  Comparison: 10/27/2009  Findings: Artifact overlies chest.  Heart size is normal. Mediastinal shadows are normal.  Lungs are clear.  No effusions. Vascularity is normal.  IMPRESSION: No active disease.  Consider two-view chest radiography if concern persists.   Original Report Authenticated By: Paulina Fusi, M.D.           No  diagnosis found.    MDM        The chart was scribed for me under my direct supervision.  I personally performed the history, physical, and medical decision making and all procedures in the evaluation of this patient.Brenda Lennert, MD 08/20/12 [phone]

## 2012-09-22 ENCOUNTER — Other Ambulatory Visit (HOSPITAL_COMMUNITY): Payer: Self-pay | Admitting: Family Medicine

## 2012-09-22 DIAGNOSIS — N63 Unspecified lump in unspecified breast: Secondary | ICD-10-CM

## 2012-10-01 ENCOUNTER — Ambulatory Visit (HOSPITAL_COMMUNITY)
Admission: RE | Admit: 2012-10-01 | Discharge: 2012-10-01 | Disposition: A | Discharge status: Home / Self Care | Payer: BC Managed Care – PPO | Source: Ambulatory Visit | Attending: Family Medicine | Admitting: Family Medicine

## 2012-10-01 ENCOUNTER — Other Ambulatory Visit (HOSPITAL_COMMUNITY): Payer: Self-pay | Admitting: Family Medicine

## 2012-10-01 DIAGNOSIS — N63 Unspecified lump in unspecified breast: Secondary | ICD-10-CM

## 2012-10-24 ENCOUNTER — Encounter: Payer: Self-pay | Admitting: Internal Medicine

## 2012-10-27 ENCOUNTER — Ambulatory Visit: Payer: BC Managed Care – PPO | Admitting: Gastroenterology

## 2012-10-31 ENCOUNTER — Ambulatory Visit (INDEPENDENT_AMBULATORY_CARE_PROVIDER_SITE_OTHER): Payer: BC Managed Care – PPO | Admitting: Gastroenterology

## 2012-10-31 ENCOUNTER — Encounter: Payer: Self-pay | Admitting: Gastroenterology

## 2012-10-31 VITALS — BP 140/100 | HR 85 | Temp 98.1°F | Ht 70.0 in | Wt 298.2 lb

## 2012-10-31 DIAGNOSIS — K5904 Chronic idiopathic constipation: Secondary | ICD-10-CM | POA: Insufficient documentation

## 2012-10-31 DIAGNOSIS — K5900 Constipation, unspecified: Secondary | ICD-10-CM

## 2012-10-31 DIAGNOSIS — D649 Anemia, unspecified: Secondary | ICD-10-CM

## 2012-10-31 LAB — CBC WITH DIFFERENTIAL/PLATELET
Basophils Absolute: 0.1 10*3/uL (ref 0.0–0.1)
Basophils Relative: 1 % (ref 0–1)
Eosinophils Absolute: 0.3 10*3/uL (ref 0.0–0.7)
Eosinophils Relative: 3 % (ref 0–5)
HCT: 37.1 % (ref 36.0–46.0)
Hemoglobin: 12.4 g/dL (ref 12.0–15.0)
Lymphocytes Relative: 30 % (ref 12–46)
Lymphs Abs: 3.5 10*3/uL (ref 0.7–4.0)
MCH: 28 pg (ref 26.0–34.0)
MCHC: 33.4 g/dL (ref 30.0–36.0)
MCV: 83.7 fL (ref 78.0–100.0)
Monocytes Absolute: 0.9 10*3/uL (ref 0.1–1.0)
Monocytes Relative: 8 % (ref 3–12)
Neutro Abs: 6.8 10*3/uL (ref 1.7–7.7)
Neutrophils Relative %: 58 % (ref 43–77)
Platelets: 353 10*3/uL (ref 150–400)
RBC: 4.43 MIL/uL (ref 3.87–5.11)
RDW: 15 % (ref 11.5–15.5)
WBC: 11.5 10*3/uL — ABNORMAL HIGH (ref 4.0–10.5)

## 2012-10-31 LAB — T4, FREE: Free T4: 1.16 ng/dL (ref 0.80–1.80)

## 2012-10-31 LAB — TSH: TSH: 1.533 u[IU]/mL (ref 0.350–4.500)

## 2012-10-31 MED ORDER — LUBIPROSTONE 24 MCG PO CAPS: 24.0000 ug | ORAL_CAPSULE | Freq: Two times a day (BID) | ORAL | Status: DC

## 2012-10-31 NOTE — Progress Notes (Signed)
Primary Care Physician: Kirk Ruths, MD  Primary Gastroenterologist:  Roetta Sessions, MD   Chief Complaint  Patient presents with  . Constipation  . Abdominal Pain    left lower side    HPI: Brenda Taylor is a 42 y.o. female here for worsening chronic constipation. Worse over the last 3-4 months. On oxycodone for about 4-5 years, take 3 times per day, stable dose. Dulcolax as needed, ten at one time. Fiber doesn't help. Never tried Miralax. MOM doesn't help. No melena, brbpr. BM only with laxatives. No significant weight change. No recent thyroid check. Occasional heartburn. No n/v. Drinks mostly caffeinated beverages only. Intermittent abdominal cramps, dull. Wonders if she has a hernia in her left abdomen.  Past Surgical History  Procedure Laterality Date  . Mastectomy, partial      x 2  . Cholecystectomy    . Appendectomy    . Carpal tunnel release      bilateral  . Back surgery      x 4  . Rotator cuff repair      right  . Tubal ligation    . Mouth surgery    . Esophagogastroduodenoscopy  03/06/2010    ZOX:WRUE quadrant distal esophageal/patent tubular esophagus/small HH, antral erosions and ulcerations. Bx negative for persistent H.pylori  . Colonoscopy  10/20/2009    AVW:UJWJXBJ anal canal/left-side diverticula and multiple polyps. poor prep compromised exam. Next TCS 09/2014.  Marland Kitchen Esophagogastroduodenoscopy  10/20/2009    YNW:GNFAO HH/prepyloric antral ulcer s/p bx (+h.pylori), ERE  . Intrauterine device insertion        Current Outpatient Prescriptions  Medication Sig Dispense Refill  . ALPRAZolam (XANAX) 1 MG tablet Take 1 mg by mouth 3 (three) times daily as needed. For anxiety      . metoprolol tartrate (LOPRESSOR) 25 MG tablet Take 25 mg by mouth 2 (two) times daily.      . Oxycodone HCl 10 MG TABS Take 10 mg by mouth 2 (two) times daily as needed. For pain       No current facility-administered medications for this visit.    Allergies as of 10/31/2012 -  Review Complete 10/31/2012  Allergen Reaction Noted  . Amoxicillin    . Aspirin  08/20/2012    ROS:  General: Negative for anorexia, weight loss, fever, chills, fatigue, weakness. ENT: Negative for hoarseness, difficulty swallowing , nasal congestion. CV: Negative for chest pain, angina, palpitations, dyspnea on exertion, peripheral edema.  Respiratory: Negative for dyspnea at rest, dyspnea on exertion, cough, sputum, wheezing.  GI: See history of present illness. GU:  Negative for dysuria, hematuria, urinary incontinence, urinary frequency, nocturnal urination.  Endo: Negative for unusual weight change.    Physical Examination:   BP 140/100  Pulse 85  Temp(Src) 98.1 F (36.7 C) (Oral)  Ht 5\' 10"  (1.778 m)  Wt 298 lb 3.2 oz (135.263 kg)  BMI 42.79 kg/m2  LMP 09/04/2012  General: Well-nourished, well-developed in no acute distress.  Eyes: No icterus. Mouth: Oropharyngeal mucosa moist and pink , no lesions erythema or exudate. Lungs: Clear to auscultation bilaterally.  Heart: Regular rate and rhythm, no murmurs rubs or gallops.  Abdomen: Bowel sounds are normal, nontender, nondistended, no hepatosplenomegaly or masses, no abdominal bruits or hernia , no rebound or guarding.   Extremities: No lower extremity edema. No clubbing or deformities. Neuro: Alert and oriented x 4   Skin: Warm and dry, no jaundice.   Psych: Alert and cooperative, normal mood and affect.  Labs:  Lab  Results  Component Value Date   WBC 7.8 08/20/2012   HGB 11.6* 08/20/2012   HCT 34.6* 08/20/2012   MCV 85.0 08/20/2012   PLT 261 08/20/2012   Lab Results  Component Value Date   CREATININE 0.59 08/20/2012   BUN 12 08/20/2012   NA 135 08/20/2012   K 3.5 08/20/2012   CL 100 08/20/2012   CO2 24 08/20/2012   Lab Results  Component Value Date   ALT 24 08/20/2012   AST 22 08/20/2012   ALKPHOS 72 08/20/2012   BILITOT 0.2* 08/20/2012   .  Imaging Studies: No results found.

## 2012-10-31 NOTE — Assessment & Plan Note (Signed)
Chronic anemia in the setting of uterine fibroids and heavy menses. She recently had an IUD placed. Recheck H/H. If persistent anemia, we will check hemoccult status and for IDA.

## 2012-10-31 NOTE — Patient Instructions (Addendum)
1. Try Amitiza 24 mcg, one tablet twice a day with food for constipation. Samples and coupon provided. Prescription sent to [facility]. 2. Please have your blood work done to check for anemia and to check your thyroid. 3. Return to the office in 6 weeks for reevaluation.  Constipation, Adult Constipation is when a person has fewer than 3 bowel movements a week; has difficulty having a bowel movement; or has stools that are dry, hard, or larger than normal. As people grow older, constipation is more common. If you try to fix constipation with medicines that make you have a bowel movement (laxatives), the problem may get worse. Long-term laxative use may cause the muscles of the colon to become weak. A low-fiber diet, not taking in enough fluids, and taking certain medicines may make constipation worse. CAUSES   Certain medicines, such as antidepressants, pain medicine, iron supplements, antacids, and water pills.   Certain diseases, such as diabetes, irritable bowel syndrome (IBS), thyroid disease, or depression.   Not drinking enough water.   Not eating enough fiber-rich foods.   Stress or travel.  Lack of physical activity or exercise.  Not going to the restroom when there is the urge to have a bowel movement.  Ignoring the urge to have a bowel movement.  Using laxatives too much. SYMPTOMS   Having fewer than 3 bowel movements a week.   Straining to have a bowel movement.   Having hard, dry, or larger than normal stools.   Feeling full or bloated.   Pain in the lower abdomen.  Not feeling relief after having a bowel movement. DIAGNOSIS  Your caregiver will take a medical history and perform a physical exam. Further testing may be done for severe constipation. Some tests may include:   A barium enema X-ray to examine your rectum, colon, and sometimes, your small intestine.  A sigmoidoscopy to examine your lower colon.  A colonoscopy to examine your entire  colon. TREATMENT  Treatment will depend on the severity of your constipation and what is causing it. Some dietary treatments include drinking more fluids and eating more fiber-rich foods. Lifestyle treatments may include regular exercise. If these diet and lifestyle recommendations do not help, your caregiver may recommend taking over-the-counter laxative medicines to help you have bowel movements. Prescription medicines may be prescribed if over-the-counter medicines do not work.  HOME CARE INSTRUCTIONS   Increase dietary fiber in your diet, such as fruits, vegetables, whole grains, and beans. Limit high-fat and processed sugars in your diet, such as [place] fries, hamburgers, cookies, candies, and soda.   A fiber supplement may be added to your diet if you cannot get enough fiber from foods.   Drink enough fluids to keep your urine clear or pale yellow.   Exercise regularly or as directed by your caregiver.   Go to the restroom when you have the urge to go. Do not hold it.  Only take medicines as directed by your caregiver. Do not take other medicines for constipation without talking to your caregiver first. SEEK IMMEDIATE MEDICAL CARE IF:   You have bright red blood in your stool.   Your constipation lasts for more than 4 days or gets worse.   You have abdominal or rectal pain.   You have thin, pencil-like stools.  You have unexplained weight loss. MAKE SURE YOU:   Understand these instructions.  Will watch your condition.  Will get help right away if you are not doing well or get worse. Document  Released: 04/06/2004 Document Revised: 10/01/2011 Document Reviewed: 06/12/2011 [facility] Patient Information 2013 [place], [place].

## 2012-10-31 NOTE — Assessment & Plan Note (Signed)
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 She has tried multiple over-the-counter laxatives and generally does not have a bowel movement unless she takes something. Her last colonoscopy was in March 2011. She will be due for repeat in 09/2014.   Discussed dietary changes including need to consume at least 8 glasses of water daily. Increase dietary fiber and daily exercise. Try Amitiza 24 mcg twice a day with food. We have provided her with samples, coupon, prescription. We will check her thyroid function.

## 2012-11-03 NOTE — Progress Notes (Signed)
Cc PCP 

## 2012-11-12 NOTE — Progress Notes (Signed)
Quick Note:  Thyroid labs and H/H all normal. Mild elevation of WBC.  Repeat CBC in 4 weeks. ______

## 2012-11-13 NOTE — Progress Notes (Signed)
Quick Note:  Tried to call pt- LMOM ______ 

## 2012-11-17 ENCOUNTER — Other Ambulatory Visit: Payer: Self-pay

## 2012-11-17 ENCOUNTER — Other Ambulatory Visit: Payer: Self-pay | Admitting: Gastroenterology

## 2012-11-17 DIAGNOSIS — D649 Anemia, unspecified: Secondary | ICD-10-CM

## 2012-12-11 LAB — CBC WITH DIFFERENTIAL/PLATELET
Basophils Absolute: 0.1 10*3/uL (ref 0.0–0.1)
Basophils Relative: 1 % (ref 0–1)
Eosinophils Absolute: 0.2 10*3/uL (ref 0.0–0.7)
Eosinophils Relative: 2 % (ref 0–5)
HCT: 38 % (ref 36.0–46.0)
Hemoglobin: 12.6 g/dL (ref 12.0–15.0)
Lymphocytes Relative: 24 % (ref 12–46)
Lymphs Abs: 2.7 10*3/uL (ref 0.7–4.0)
MCH: 27 pg (ref 26.0–34.0)
MCHC: 33.2 g/dL (ref 30.0–36.0)
MCV: 81.4 fL (ref 78.0–100.0)
Monocytes Absolute: 0.8 10*3/uL (ref 0.1–1.0)
Monocytes Relative: 7 % (ref 3–12)
Neutro Abs: 7.3 10*3/uL (ref 1.7–7.7)
Neutrophils Relative %: 66 % (ref 43–77)
Platelets: 328 10*3/uL (ref 150–400)
RBC: 4.67 MIL/uL (ref 3.87–5.11)
RDW: 15.2 % (ref 11.5–15.5)
WBC: 11.2 10*3/uL — ABNORMAL HIGH (ref 4.0–10.5)

## 2012-12-11 NOTE — Progress Notes (Signed)
Quick Note:  WBC little better but still mildly elevated. I would recommend one more repeat in 6-8 weeks.  Make sure she is not having fever, cough, pain, dysuria. ______

## 2012-12-11 NOTE — Progress Notes (Signed)
Quick Note:  Tried to call pt- LMOM ______ 

## 2012-12-12 ENCOUNTER — Ambulatory Visit: Payer: BC Managed Care – PPO | Admitting: Gastroenterology

## 2012-12-12 ENCOUNTER — Other Ambulatory Visit: Payer: Self-pay | Admitting: Gastroenterology

## 2012-12-12 DIAGNOSIS — D649 Anemia, unspecified: Secondary | ICD-10-CM

## 2012-12-12 NOTE — Progress Notes (Signed)
Quick Note:  Pt aware, lab order on file. Pt reports no fever,cough, or sickness. ______

## 2012-12-23 ENCOUNTER — Ambulatory Visit: Payer: BC Managed Care – PPO | Admitting: Gastroenterology

## 2012-12-23 ENCOUNTER — Telehealth: Payer: Self-pay | Admitting: Gastroenterology

## 2012-12-23 NOTE — Telephone Encounter (Signed)
Pt was a no show

## 2013-01-13 ENCOUNTER — Other Ambulatory Visit: Payer: Self-pay

## 2013-01-13 DIAGNOSIS — D649 Anemia, unspecified: Secondary | ICD-10-CM

## 2013-01-30 LAB — CBC WITH DIFFERENTIAL/PLATELET
Basophils Absolute: 0.1 10*3/uL (ref 0.0–0.1)
Basophils Relative: 1 % (ref 0–1)
Eosinophils Absolute: 0.3 10*3/uL (ref 0.0–0.7)
Eosinophils Relative: 2 % (ref 0–5)
HCT: 39 % (ref 36.0–46.0)
Hemoglobin: 13.1 g/dL (ref 12.0–15.0)
Lymphocytes Relative: 29 % (ref 12–46)
Lymphs Abs: 3.6 10*3/uL (ref 0.7–4.0)
MCH: 27.3 pg (ref 26.0–34.0)
MCHC: 33.6 g/dL (ref 30.0–36.0)
MCV: 81.3 fL (ref 78.0–100.0)
Monocytes Absolute: 1.1 10*3/uL — ABNORMAL HIGH (ref 0.1–1.0)
Monocytes Relative: 9 % (ref 3–12)
Neutro Abs: 7.4 10*3/uL (ref 1.7–7.7)
Neutrophils Relative %: 59 % (ref 43–77)
Platelets: 330 10*3/uL (ref 150–400)
RBC: 4.8 MIL/uL (ref 3.87–5.11)
RDW: 15.4 % (ref 11.5–15.5)
WBC: 12.4 10*3/uL — ABNORMAL HIGH (ref 4.0–10.5)

## 2013-02-02 NOTE — Progress Notes (Signed)
Quick Note:  WBC still slightly up. H/H normal.  Recheck CBC in 3 months. If still up at that time, then hematology referral. Has she had any sinusitis, cough, dysuria, fever, etc? ______

## 2013-02-04 NOTE — Progress Notes (Signed)
REVIEWED.  

## 2013-02-19 ENCOUNTER — Other Ambulatory Visit: Payer: Self-pay

## 2013-02-19 DIAGNOSIS — D649 Anemia, unspecified: Secondary | ICD-10-CM

## 2013-02-25 ENCOUNTER — Telehealth: Payer: Self-pay | Admitting: Orthopedic Surgery

## 2013-02-25 NOTE — Telephone Encounter (Signed)
Patient called to relay that she is looking to have a 2nd opinion and is requesting medical records.  Advised of process, including initially signing a medical release of information form.  She will come in 02/26/13 to complete form, as well as completing a release form at [facility] for copies of films.

## 2013-03-19 ENCOUNTER — Emergency Department (HOSPITAL_COMMUNITY)
Admission: EM | Admit: 2013-03-19 | Discharge: 2013-03-19 | Disposition: A | Discharge status: Home / Self Care | Payer: BC Managed Care – PPO | Attending: Emergency Medicine | Admitting: Emergency Medicine

## 2013-03-19 ENCOUNTER — Encounter (HOSPITAL_COMMUNITY): Payer: Self-pay | Admitting: Emergency Medicine

## 2013-03-19 DIAGNOSIS — W268XXA Contact with other sharp object(s), not elsewhere classified, initial encounter: Secondary | ICD-10-CM | POA: Insufficient documentation

## 2013-03-19 DIAGNOSIS — S61411A Laceration without foreign body of right hand, initial encounter: Secondary | ICD-10-CM

## 2013-03-19 DIAGNOSIS — G8929 Other chronic pain: Secondary | ICD-10-CM | POA: Insufficient documentation

## 2013-03-19 DIAGNOSIS — Z79899 Other long term (current) drug therapy: Secondary | ICD-10-CM | POA: Insufficient documentation

## 2013-03-19 DIAGNOSIS — Z9889 Other specified postprocedural states: Secondary | ICD-10-CM | POA: Insufficient documentation

## 2013-03-19 DIAGNOSIS — I10 Essential (primary) hypertension: Secondary | ICD-10-CM | POA: Insufficient documentation

## 2013-03-19 DIAGNOSIS — Y93G1 Activity, food preparation and clean up: Secondary | ICD-10-CM | POA: Insufficient documentation

## 2013-03-19 DIAGNOSIS — F411 Generalized anxiety disorder: Secondary | ICD-10-CM | POA: Insufficient documentation

## 2013-03-19 DIAGNOSIS — S61409A Unspecified open wound of unspecified hand, initial encounter: Secondary | ICD-10-CM | POA: Insufficient documentation

## 2013-03-19 DIAGNOSIS — F17200 Nicotine dependence, unspecified, uncomplicated: Secondary | ICD-10-CM | POA: Insufficient documentation

## 2013-03-19 DIAGNOSIS — Y92009 Unspecified place in unspecified non-institutional (private) residence as the place of occurrence of the external cause: Secondary | ICD-10-CM | POA: Insufficient documentation

## 2013-03-19 DIAGNOSIS — Z8739 Personal history of other diseases of the musculoskeletal system and connective tissue: Secondary | ICD-10-CM | POA: Insufficient documentation

## 2013-03-19 DIAGNOSIS — Z792 Long term (current) use of antibiotics: Secondary | ICD-10-CM | POA: Insufficient documentation

## 2013-03-19 DIAGNOSIS — Z23 Encounter for immunization: Secondary | ICD-10-CM | POA: Insufficient documentation

## 2013-03-19 DIAGNOSIS — Z8719 Personal history of other diseases of the digestive system: Secondary | ICD-10-CM | POA: Insufficient documentation

## 2013-03-19 DIAGNOSIS — Z8711 Personal history of peptic ulcer disease: Secondary | ICD-10-CM | POA: Insufficient documentation

## 2013-03-19 MED ORDER — BACITRACIN ZINC 500 UNIT/GM EX OINT
TOPICAL_OINTMENT | CUTANEOUS | Status: AC
  Filled 2013-03-19: qty 0.9

## 2013-03-19 MED ORDER — LIDOCAINE HCL (PF) 1 % IJ SOLN
5.0000 mL | Freq: Once | INTRAMUSCULAR | Status: DC
  Filled 2013-03-19: qty 5

## 2013-03-19 MED ORDER — TETANUS-DIPHTH-ACELL PERTUSSIS 5-2.5-18.5 LF-MCG/0.5 IM SUSP
0.5000 mL | Freq: Once | INTRAMUSCULAR | Status: AC
  Administered 2013-03-19: 0.5 mL via INTRAMUSCULAR
  Filled 2013-03-19: qty 0.5

## 2013-03-19 NOTE — ED Notes (Signed)
Cut right hand on glass washing dishes this am. [place] aprox 1 and quarter inch lac to posterior hand between index and middle finger. Bleeding controlled.

## 2013-03-20 NOTE — ED Provider Notes (Signed)
Medical screening examination/treatment/procedure(s) were performed by non-physician practitioner and as supervising physician I was immediately available for consultation/collaboration.  Donnetta Hutching, MD 03/20/13 2040

## 2013-03-20 NOTE — ED Provider Notes (Signed)
CSN: [id number]     Arrival date & time 03/19/13  1028 History   First MD Initiated Contact with Patient 03/19/13 1047     Chief Complaint  Patient presents with  . Laceration   (Consider location/radiation/quality/duration/timing/severity/associated sxs/prior Treatment) HPI SAKIYAH SHUR is a 42 y.o. female who presents to the ED with a laceration to the right hand. She was washing dishes and cut her hand on a broken glass. She denies any other problems.   Past Medical History  Diagnosis Date  . HBP (high blood pressure)   . DDD (degenerative disc disease)   . Ulcer disease   . Hernia   . Chronic pain   . Anxiety    Past Surgical History  Procedure Laterality Date  . Mastectomy, partial      x 2  . Cholecystectomy    . Appendectomy    . Carpal tunnel release      bilateral  . Back surgery      x 4  . Rotator cuff repair      right  . Tubal ligation    . Mouth surgery    . Esophagogastroduodenoscopy  03/06/2010    QMV:HQIO quadrant distal esophageal/patent tubular esophagus/small HH, antral erosions and ulcerations. Bx negative for persistent H.pylori  . Colonoscopy  10/20/2009    NGE:XBMWUXL anal canal/left-side diverticula and multiple polyps. poor prep compromised exam. Next TCS 09/2014.  Marland Kitchen Esophagogastroduodenoscopy  10/20/2009    KGM:WNUUV HH/prepyloric antral ulcer s/p bx (+h.pylori), ERE  . Intrauterine device insertion     Family History  Problem Relation Age of Onset  . Heart disease    . Arthritis    . Cancer    . Asthma    . Diabetes     History  Substance Use Topics  . Smoking status: Current Every Day Smoker -- 1.00 packs/day    Types: Cigarettes  . Smokeless tobacco: Not on file  . Alcohol Use: No   OB History   Grav Para Term Preterm Abortions TAB SAB Ect Mult Living                 Review of Systems  Constitutional: Negative for fever.  Musculoskeletal:       Laceration right hand  Skin: Positive for wound.  Neurological: Negative for  headaches.  Psychiatric/Behavioral: The patient is not nervous/anxious.     Allergies  Amoxicillin and Aspirin  Home Medications   Current Outpatient Rx  Name  Route  Sig  Dispense  Refill  . ALPRAZolam (XANAX) 1 MG tablet   Oral   Take 1 mg by mouth 3 (three) times daily as needed for sleep or anxiety. For anxiety         . doxycycline (DORYX) 100 MG EC tablet   Oral   Take 100 mg by mouth 2 (two) times daily.         . metoprolol tartrate (LOPRESSOR) 25 MG tablet   Oral   Take 25 mg by mouth 2 (two) times daily.         . Oxycodone HCl 10 MG TABS   Oral   Take 10 mg by mouth 2 (two) times daily as needed. For pain         . azithromycin (ZITHROMAX) 250 MG tablet   Oral   Take 250 mg by mouth daily.          BP 132/81  Pulse 86  Temp(Src) 98.4 F (36.9 C) (Oral)  Resp 19  SpO2 97% Physical Exam  Nursing note and vitals reviewed. Constitutional: She is oriented to person, place, and time. She appears well-developed and well-nourished. No distress.  HENT:  Head: Normocephalic.  Eyes: EOM are normal.  Neck: Neck supple.  Cardiovascular: Normal rate.   Pulmonary/Chest: Effort normal.  Musculoskeletal:       Right hand: She exhibits laceration. She exhibits normal range of motion, no tenderness, normal capillary refill and no deformity. Normal sensation noted. Normal strength noted.       Hands: Neurological: She is alert and oriented to person, place, and time. No cranial nerve deficit.  Skin: Skin is warm and dry.  Psychiatric: She has a normal mood and affect. Her behavior is normal.    ED Course  Procedures LACERATION REPAIR Performed by: Columbus Ice Authorized by: Kearsten Ginther Consent: Verbal consent obtained. Risks and benefits: risks, benefits and alternatives were discussed Consent given by: patient Patient identity confirmed: provided demographic data Prepped and Draped in normal sterile fashion Wound explored  Laceration Location:  right  Laceration Length: 2.5 cm  No Foreign Bodies seen or palpated  Anesthesia: local infiltration  Local anesthetic: lidocaine 1% without epinephrine  Anesthetic total: 2 ml  Irrigation method: syringe Amount of cleaning: standard  Skin closure: 5-0 prolege  Number of sutures:5  Technique: interrupted  Patient tolerance: Patient tolerated the procedure well with no immediate complications.   MDM   1. Laceration of hand, right, initial encounter    42 y.o. female with laceration to right hand. Closed without difficulty. Patient stable for discharge home to follow up with PCP for suture removal in 7 days. She will return here for any problems.  Discussed with the patient and all questioned fully answered    Medication List    ASK your doctor about these medications       ALPRAZolam 1 MG tablet  Commonly known as:  XANAX  Take 1 mg by mouth 3 (three) times daily as needed for sleep or anxiety. For anxiety     azithromycin 250 MG tablet  Commonly known as:  ZITHROMAX  Take 250 mg by mouth daily.     doxycycline 100 MG EC tablet  Commonly known as:  DORYX  Take 100 mg by mouth 2 (two) times daily.     metoprolol tartrate 25 MG tablet  Commonly known as:  LOPRESSOR  Take 25 mg by mouth 2 (two) times daily.     Oxycodone HCl 10 MG Tabs  Take 10 mg by mouth 2 (two) times daily as needed. For pain           Janne Napoleon, NP 03/20/13 [phone]

## 2013-04-13 ENCOUNTER — Other Ambulatory Visit: Payer: Self-pay

## 2013-04-13 DIAGNOSIS — D649 Anemia, unspecified: Secondary | ICD-10-CM

## 2013-05-01 LAB — CBC WITH DIFFERENTIAL/PLATELET
Basophils Absolute: 0.1 10*3/uL (ref 0.0–0.1)
Basophils Relative: 1 % (ref 0–1)
Eosinophils Absolute: 0.3 10*3/uL (ref 0.0–0.7)
Eosinophils Relative: 2 % (ref 0–5)
HCT: 40.3 % (ref 36.0–46.0)
Hemoglobin: 13.9 g/dL (ref 12.0–15.0)
Lymphocytes Relative: 27 % (ref 12–46)
Lymphs Abs: 3.1 10*3/uL (ref 0.7–4.0)
MCH: 29.3 pg (ref 26.0–34.0)
MCHC: 34.5 g/dL (ref 30.0–36.0)
MCV: 85 fL (ref 78.0–100.0)
Monocytes Absolute: 0.8 10*3/uL (ref 0.1–1.0)
Monocytes Relative: 7 % (ref 3–12)
Neutro Abs: 7.3 10*3/uL (ref 1.7–7.7)
Neutrophils Relative %: 63 % (ref 43–77)
Platelets: 316 10*3/uL (ref 150–400)
RBC: 4.74 MIL/uL (ref 3.87–5.11)
RDW: 14.5 % (ref 11.5–15.5)
WBC: 11.5 10*3/uL — ABNORMAL HIGH (ref 4.0–10.5)

## 2013-05-04 NOTE — Progress Notes (Signed)
Quick Note:  WBC still up. Likely secondary to smoking. I would offer her hematology consultation for chronic leukocytosis. ______

## 2013-05-06 ENCOUNTER — Other Ambulatory Visit: Payer: Self-pay

## 2013-05-06 DIAGNOSIS — D649 Anemia, unspecified: Secondary | ICD-10-CM

## 2013-05-06 NOTE — Progress Notes (Signed)
Quick Note:  Her WBCs have been up since 10/2012.  If her Lymes Disease was prior to this, I would go for hematology eval. If her Lymes Disease was after this, I would repeat her CBC in 3 months. ______

## 2013-06-23 ENCOUNTER — Other Ambulatory Visit (HOSPITAL_COMMUNITY): Payer: Self-pay | Admitting: Internal Medicine

## 2013-06-23 ENCOUNTER — Ambulatory Visit (HOSPITAL_COMMUNITY)
Admission: RE | Admit: 2013-06-23 | Discharge: 2013-06-23 | Disposition: A | Discharge status: Home / Self Care | Payer: BC Managed Care – PPO | Source: Ambulatory Visit | Attending: Internal Medicine | Admitting: Internal Medicine

## 2013-06-23 DIAGNOSIS — M898X9 Other specified disorders of bone, unspecified site: Secondary | ICD-10-CM | POA: Insufficient documentation

## 2013-06-23 DIAGNOSIS — M79609 Pain in unspecified limb: Secondary | ICD-10-CM | POA: Insufficient documentation

## 2013-06-23 DIAGNOSIS — M25561 Pain in right knee: Secondary | ICD-10-CM

## 2013-06-30 ENCOUNTER — Ambulatory Visit (INDEPENDENT_AMBULATORY_CARE_PROVIDER_SITE_OTHER): Payer: BC Managed Care – PPO | Admitting: Orthopedic Surgery

## 2013-06-30 VITALS — BP 122/80 | Ht 70.0 in | Wt 287.0 lb

## 2013-06-30 DIAGNOSIS — M5126 Other intervertebral disc displacement, lumbar region: Secondary | ICD-10-CM

## 2013-06-30 NOTE — Patient Instructions (Signed)
Refer to Dr Venetia Maxon  For right leg radiculopathy

## 2013-07-02 ENCOUNTER — Ambulatory Visit: Payer: BC Managed Care – PPO | Admitting: Orthopedic Surgery

## 2013-07-03 DIAGNOSIS — M5126 Other intervertebral disc displacement, lumbar region: Secondary | ICD-10-CM | POA: Insufficient documentation

## 2013-07-03 NOTE — Progress Notes (Addendum)
Patient ID: Brenda Taylor, female   DOB: 10/14/1970, 42 y.o.   MRN: [id number] Chief Complaint  Patient presents with  . Knee Pain    Right knee pain, referred by Dr. Sherwood Gambler. Jillyn Hidden at [facility].   42 year old female with a two-month history of pain numbness and tingling and weakness of her leg and hip. She's been on Neurontin for month no improvement. She complains of sharp dull throbbing stabbing burning 9/10 constant pain associated with numbness tingling and giving out of the right lower extremity  Review of systems fatigue cough constipation anxiety easy bruising.  Past Medical History  Diagnosis Date  . HBP (high blood pressure)   . DDD (degenerative disc disease)   . Ulcer disease   . Hernia   . Chronic pain   . Anxiety     Past Surgical History  Procedure Laterality Date  . Mastectomy, partial      x 2  . Cholecystectomy    . Appendectomy    . Carpal tunnel release      bilateral  . Back surgery      x 4  . Rotator cuff repair      right  . Tubal ligation    . Mouth surgery    . Esophagogastroduodenoscopy  03/06/2010    NWG:NFAO quadrant distal esophageal/patent tubular esophagus/small HH, antral erosions and ulcerations. Bx negative for persistent H.pylori  . Colonoscopy  10/20/2009    ZHY:QMVHQIO anal canal/left-side diverticula and multiple polyps. poor prep compromised exam. Next TCS 09/2014.  Marland Kitchen Esophagogastroduodenoscopy  10/20/2009    NGE:XBMWU HH/prepyloric antral ulcer s/p bx (+h.pylori), ERE  . Intrauterine device insertion        Physical Exam(12)  Vital signs: BP 122/80  Ht 5\' 10"  (1.778 m)  Wt 287 lb (130.182 kg)  BMI 41.18 kg/m2  1.GENERAL: normal development   2. CDV: pulses are normal   3. Skin: normal  4. Lymph: nodes were not palpable/normal  5/6. Psychiatric: awake, alert and oriented, mood and affect normal   7. Neuro: normal sensation  8.   MSK  Gait: abnormal right lower extremity limp  9.   Inspection knee look s fine feels fine  no tenderness  10. Range of Motion 110 degrees painless  11. Motor normal  12. Stability normal      Assessment: seems to be having classic radicular symptoms     Plan: refer to Neurosurgeon

## 2013-07-07 ENCOUNTER — Other Ambulatory Visit: Payer: Self-pay | Admitting: *Deleted

## 2013-07-07 ENCOUNTER — Telehealth: Payer: Self-pay | Admitting: *Deleted

## 2013-07-07 DIAGNOSIS — M5126 Other intervertebral disc displacement, lumbar region: Secondary | ICD-10-CM

## 2013-07-07 NOTE — Telephone Encounter (Signed)
Faxed office notes and referral to [facility]. Awaiting appointment. 

## 2013-07-13 ENCOUNTER — Other Ambulatory Visit: Payer: Self-pay

## 2013-07-13 DIAGNOSIS — D649 Anemia, unspecified: Secondary | ICD-10-CM

## 2013-07-20 ENCOUNTER — Other Ambulatory Visit (HOSPITAL_COMMUNITY): Payer: Self-pay | Admitting: Family Medicine

## 2013-07-20 ENCOUNTER — Ambulatory Visit (HOSPITAL_COMMUNITY)
Admission: RE | Admit: 2013-07-20 | Discharge: 2013-07-20 | Disposition: A | Discharge status: Home / Self Care | Payer: BC Managed Care – PPO | Source: Ambulatory Visit | Attending: Family Medicine | Admitting: Family Medicine

## 2013-07-20 DIAGNOSIS — J069 Acute upper respiratory infection, unspecified: Secondary | ICD-10-CM

## 2013-07-20 DIAGNOSIS — M479 Spondylosis, unspecified: Secondary | ICD-10-CM | POA: Insufficient documentation

## 2013-07-20 DIAGNOSIS — R52 Pain, unspecified: Secondary | ICD-10-CM | POA: Insufficient documentation

## 2013-07-20 DIAGNOSIS — R509 Fever, unspecified: Secondary | ICD-10-CM | POA: Insufficient documentation

## 2013-07-22 NOTE — Telephone Encounter (Signed)
Patient has an appointment with Dr. Venetia Maxon 08/24/13 at 9:00 am.

## 2013-08-03 LAB — CBC WITH DIFFERENTIAL/PLATELET
Basophils Absolute: 0.1 10*3/uL (ref 0.0–0.1)
Basophils Relative: 1 % (ref 0–1)
Eosinophils Absolute: 0.2 10*3/uL (ref 0.0–0.7)
Eosinophils Relative: 2 % (ref 0–5)
HCT: 41.7 % (ref 36.0–46.0)
Hemoglobin: 14.1 g/dL (ref 12.0–15.0)
Lymphocytes Relative: 27 % (ref 12–46)
Lymphs Abs: 3.2 10*3/uL (ref 0.7–4.0)
MCH: 29.1 pg (ref 26.0–34.0)
MCHC: 33.8 g/dL (ref 30.0–36.0)
MCV: 86 fL (ref 78.0–100.0)
Monocytes Absolute: 0.9 10*3/uL (ref 0.1–1.0)
Monocytes Relative: 7 % (ref 3–12)
Neutro Abs: 7.8 10*3/uL — ABNORMAL HIGH (ref 1.7–7.7)
Neutrophils Relative %: 63 % (ref 43–77)
Platelets: 311 10*3/uL (ref 150–400)
RBC: 4.85 MIL/uL (ref 3.87–5.11)
RDW: 14 % (ref 11.5–15.5)
WBC: 12.1 10*3/uL — ABNORMAL HIGH (ref 4.0–10.5)

## 2013-08-10 ENCOUNTER — Other Ambulatory Visit: Payer: Self-pay | Admitting: Gastroenterology

## 2013-08-10 DIAGNOSIS — D649 Anemia, unspecified: Secondary | ICD-10-CM

## 2013-08-28 ENCOUNTER — Other Ambulatory Visit (HOSPITAL_COMMUNITY): Payer: Self-pay | Admitting: Family Medicine

## 2013-08-28 DIAGNOSIS — Z139 Encounter for screening, unspecified: Secondary | ICD-10-CM

## 2013-10-05 ENCOUNTER — Ambulatory Visit (HOSPITAL_COMMUNITY)
Admission: RE | Admit: 2013-10-05 | Discharge: 2013-10-05 | Disposition: A | Discharge status: Home / Self Care | Payer: BC Managed Care – PPO | Source: Ambulatory Visit | Attending: Family Medicine | Admitting: Family Medicine

## 2013-10-05 DIAGNOSIS — Z139 Encounter for screening, unspecified: Secondary | ICD-10-CM

## 2013-10-05 DIAGNOSIS — Z1231 Encounter for screening mammogram for malignant neoplasm of breast: Secondary | ICD-10-CM | POA: Insufficient documentation

## 2013-10-13 ENCOUNTER — Other Ambulatory Visit: Payer: Self-pay

## 2013-10-13 DIAGNOSIS — D649 Anemia, unspecified: Secondary | ICD-10-CM

## 2013-11-04 ENCOUNTER — Other Ambulatory Visit: Payer: Self-pay | Admitting: Physician Assistant

## 2014-02-08 ENCOUNTER — Other Ambulatory Visit (HOSPITAL_COMMUNITY): Payer: Self-pay | Admitting: Family Medicine

## 2014-02-08 ENCOUNTER — Ambulatory Visit (HOSPITAL_COMMUNITY)
Admission: RE | Admit: 2014-02-08 | Discharge: 2014-02-08 | Disposition: A | Discharge status: Home / Self Care | Payer: BC Managed Care – PPO | Source: Ambulatory Visit | Attending: Family Medicine | Admitting: Family Medicine

## 2014-02-08 ENCOUNTER — Encounter (INDEPENDENT_AMBULATORY_CARE_PROVIDER_SITE_OTHER): Payer: Self-pay

## 2014-02-08 DIAGNOSIS — F17200 Nicotine dependence, unspecified, uncomplicated: Secondary | ICD-10-CM

## 2014-02-08 DIAGNOSIS — R062 Wheezing: Secondary | ICD-10-CM

## 2014-02-08 DIAGNOSIS — R05 Cough: Secondary | ICD-10-CM

## 2014-02-08 DIAGNOSIS — R059 Cough, unspecified: Secondary | ICD-10-CM | POA: Insufficient documentation

## 2014-02-18 ENCOUNTER — Institutional Professional Consult (permissible substitution): Payer: Self-pay | Admitting: Neurology

## 2014-02-18 ENCOUNTER — Telehealth: Payer: Self-pay | Admitting: Neurology

## 2014-02-18 NOTE — Telephone Encounter (Signed)
No show for appointment made on 02-14-14, for 02-18-14 in AM>

## 2014-03-04 ENCOUNTER — Encounter: Payer: Self-pay | Admitting: Neurology

## 2014-03-28 ENCOUNTER — Encounter (HOSPITAL_COMMUNITY): Payer: Self-pay | Admitting: Emergency Medicine

## 2014-03-28 ENCOUNTER — Emergency Department (HOSPITAL_COMMUNITY)
Admission: EM | Admit: 2014-03-28 | Discharge: 2014-03-28 | Disposition: A | Discharge status: Home / Self Care | Payer: BC Managed Care – PPO | Attending: Emergency Medicine | Admitting: Emergency Medicine

## 2014-03-28 DIAGNOSIS — K0889 Other specified disorders of teeth and supporting structures: Secondary | ICD-10-CM

## 2014-03-28 DIAGNOSIS — F17200 Nicotine dependence, unspecified, uncomplicated: Secondary | ICD-10-CM | POA: Diagnosis not present

## 2014-03-28 DIAGNOSIS — Z880 Allergy status to penicillin: Secondary | ICD-10-CM | POA: Insufficient documentation

## 2014-03-28 DIAGNOSIS — M549 Dorsalgia, unspecified: Secondary | ICD-10-CM | POA: Insufficient documentation

## 2014-03-28 DIAGNOSIS — R51 Headache: Secondary | ICD-10-CM | POA: Diagnosis not present

## 2014-03-28 DIAGNOSIS — K029 Dental caries, unspecified: Secondary | ICD-10-CM | POA: Insufficient documentation

## 2014-03-28 DIAGNOSIS — Z8719 Personal history of other diseases of the digestive system: Secondary | ICD-10-CM | POA: Diagnosis not present

## 2014-03-28 DIAGNOSIS — Z872 Personal history of diseases of the skin and subcutaneous tissue: Secondary | ICD-10-CM | POA: Insufficient documentation

## 2014-03-28 DIAGNOSIS — G8929 Other chronic pain: Secondary | ICD-10-CM | POA: Insufficient documentation

## 2014-03-28 DIAGNOSIS — F411 Generalized anxiety disorder: Secondary | ICD-10-CM | POA: Insufficient documentation

## 2014-03-28 DIAGNOSIS — IMO0002 Reserved for concepts with insufficient information to code with codable children: Secondary | ICD-10-CM | POA: Diagnosis not present

## 2014-03-28 DIAGNOSIS — Z792 Long term (current) use of antibiotics: Secondary | ICD-10-CM | POA: Insufficient documentation

## 2014-03-28 DIAGNOSIS — Z79899 Other long term (current) drug therapy: Secondary | ICD-10-CM | POA: Insufficient documentation

## 2014-03-28 DIAGNOSIS — Z9889 Other specified postprocedural states: Secondary | ICD-10-CM | POA: Insufficient documentation

## 2014-03-28 DIAGNOSIS — K089 Disorder of teeth and supporting structures, unspecified: Secondary | ICD-10-CM | POA: Diagnosis present

## 2014-03-28 DIAGNOSIS — I10 Essential (primary) hypertension: Secondary | ICD-10-CM | POA: Diagnosis not present

## 2014-03-28 MED ORDER — CLINDAMYCIN HCL 150 MG PO CAPS
300.0000 mg | ORAL_CAPSULE | Freq: Once | ORAL | Status: AC
  Administered 2014-03-28: 300 mg via ORAL
  Filled 2014-03-28: qty 2

## 2014-03-28 MED ORDER — CLINDAMYCIN HCL 150 MG PO CAPS: ORAL_CAPSULE | ORAL | Status: DC

## 2014-03-28 NOTE — Discharge Instructions (Signed)
Please see a dentist as soon as possible. Please use clindamycin with each meal and at bedtime. Please return if any high fever or sign of advancing infection. Dental Pain A tooth ache may be caused by cavities (tooth decay). Cavities expose the nerve of the tooth to air and hot or cold temperatures. It may come from an infection or abscess (also called a boil or furuncle) around your tooth. It is also often caused by dental caries (tooth decay). This causes the pain you are having. DIAGNOSIS  Your caregiver can diagnose this problem by exam. TREATMENT   If caused by an infection, it may be treated with medications which kill germs (antibiotics) and pain medications as prescribed by your caregiver. Take medications as directed.  Only take over-the-counter or prescription medicines for pain, discomfort, or fever as directed by your caregiver.  Whether the tooth ache today is caused by infection or dental disease, you should see your dentist as soon as possible for further care. SEEK MEDICAL CARE IF: The exam and treatment you received today has been provided on an emergency basis only. This is not a substitute for complete medical or dental care. If your problem worsens or new problems (symptoms) appear, and you are unable to meet with your dentist, call or return to this location. SEEK IMMEDIATE MEDICAL CARE IF:   You have a fever.  You develop redness and swelling of your face, jaw, or neck.  You are unable to open your mouth.  You have severe pain uncontrolled by pain medicine. MAKE SURE YOU:   Understand these instructions.  Will watch your condition.  Will get help right away if you are not doing well or get worse. Document Released: 07/09/2005 Document Revised: 10/01/2011 Document Reviewed: 02/25/2008 [facility] Patient Information 2015 [place], [place]. This information is not intended to replace advice given to you by your health care provider. Make sure you discuss any questions  you have with your health care provider.

## 2014-03-28 NOTE — ED Notes (Signed)
Pt states she had a filling come out from right lower tooth on Friday, states has been swelling and painful since

## 2014-03-28 NOTE — ED Provider Notes (Signed)
CSN: [id number]     Arrival date & time 03/28/14  2315 History   First MD Initiated Contact with Patient 03/28/14 2325     Chief Complaint  Patient presents with  . Dental Pain     (Consider location/radiation/quality/duration/timing/severity/associated sxs/prior Treatment) Patient is a 43 y.o. female presenting with tooth pain. The history is provided by the patient.  Dental Pain Location:  Lower Quality:  Throbbing and sharp Severity:  Moderate Onset quality:  Gradual Duration:  16 hours Timing:  Intermittent Progression:  Worsening Chronicity:  Chronic Context: filling fell out and poor dentition   Relieved by:  Nothing Worsened by:  Cold food/drink Ineffective treatments:  NSAIDs Associated symptoms: headaches   Associated symptoms: no difficulty swallowing and no neck pain   Risk factors: smoking   Risk factors: no alcohol problem     Past Medical History  Diagnosis Date  . HBP (high blood pressure)   . DDD (degenerative disc disease)   . Ulcer disease   . Hernia   . Chronic pain   . Anxiety    Past Surgical History  Procedure Laterality Date  . Mastectomy, partial      x 2  . Cholecystectomy    . Appendectomy    . Carpal tunnel release      bilateral  . Back surgery      x 4  . Rotator cuff repair      right  . Tubal ligation    . Mouth surgery    . Esophagogastroduodenoscopy  03/06/2010    WUJ:WJXB quadrant distal esophageal/patent tubular esophagus/small HH, antral erosions and ulcerations. Bx negative for persistent H.pylori  . Colonoscopy  10/20/2009    JYN:WGNFAOZ anal canal/left-side diverticula and multiple polyps. poor prep compromised exam. Next TCS 09/2014.  Marland Kitchen Esophagogastroduodenoscopy  10/20/2009    HYQ:MVHQI HH/prepyloric antral ulcer s/p bx (+h.pylori), ERE  . Intrauterine device insertion     Family History  Problem Relation Age of Onset  . Heart disease    . Arthritis    . Cancer    . Asthma    . Diabetes     History  Substance  Use Topics  . Smoking status: Current Every Day Smoker -- 1.00 packs/day    Types: Cigarettes  . Smokeless tobacco: Not on file  . Alcohol Use: No   OB History   Grav Para Term Preterm Abortions TAB SAB Ect Mult Living                 Review of Systems  Constitutional: Negative for activity change.       All ROS Neg except as noted in HPI  HENT: Positive for dental problem.   Eyes: Negative for photophobia and discharge.  Respiratory: Negative for cough, shortness of breath and wheezing.   Cardiovascular: Negative for chest pain and palpitations.  Gastrointestinal: Negative for abdominal pain and blood in stool.  Genitourinary: Negative for dysuria, frequency and hematuria.  Musculoskeletal: Positive for back pain. Negative for arthralgias and neck pain.  Skin: Negative.   Neurological: Positive for headaches. Negative for dizziness, seizures and speech difficulty.  Psychiatric/Behavioral: Negative for hallucinations and confusion. The patient is nervous/anxious.       Allergies  Amoxicillin and Aspirin  Home Medications   Prior to Admission medications   Medication Sig Start Date End Date Taking? Authorizing Provider  ALPRAZolam Duanne Moron) 1 MG tablet Take 1 mg by mouth 3 (three) times daily as needed for sleep or anxiety. For anxiety  Yes Historical Provider, MD  citalopram (CELEXA) 20 MG tablet Take 20 mg by mouth daily.   Yes Historical Provider, MD  ibuprofen (ADVIL,MOTRIN) 600 MG tablet Take 600 mg by mouth every 6 (six) hours as needed.   Yes Historical Provider, MD  metoprolol tartrate (LOPRESSOR) 25 MG tablet Take 25 mg by mouth 2 (two) times daily.   Yes Historical Provider, MD  Oxycodone HCl 10 MG TABS Take 10 mg by mouth 2 (two) times daily as needed. For pain   Yes Historical Provider, MD  azithromycin (ZITHROMAX) 250 MG tablet Take 250 mg by mouth daily. 03/08/13   Historical Provider, MD  doxycycline (DORYX) 100 MG EC tablet Take 100 mg by mouth 2 (two) times  daily. 03/08/13   Historical Provider, MD   BP 134/71  Pulse 91  Temp(Src) 98.9 F (37.2 C) (Oral)  Resp 20  Ht 5\' 4"  (1.626 m)  Wt 258 lb (117.028 kg)  BMI 44.26 kg/m2  SpO2 96% Physical Exam  Nursing note and vitals reviewed. Constitutional: She is oriented to person, place, and time. She appears well-developed and well-nourished.  Non-toxic appearance.  HENT:  Head: Normocephalic.  Right Ear: Tympanic membrane and external ear normal.  Left Ear: Tympanic membrane and external ear normal.  Right lower premolar decayed to the gum line. No abscess. No swelling under the tongue.  Eyes: EOM and lids are normal. Pupils are equal, round, and reactive to light.  Neck: Normal range of motion. Neck supple. Carotid bruit is not present.  Cardiovascular: Normal rate, regular rhythm, normal heart sounds, intact distal pulses and normal pulses.   Pulmonary/Chest: Breath sounds normal. No respiratory distress.  Abdominal: Soft. Bowel sounds are normal. There is no tenderness. There is no guarding.  Musculoskeletal: Normal range of motion.  Lymphadenopathy:       Head (right side): No submandibular adenopathy present.       Head (left side): No submandibular adenopathy present.    She has no cervical adenopathy.  Neurological: She is alert and oriented to person, place, and time. She has normal strength. No cranial nerve deficit or sensory deficit.  Skin: Skin is warm and dry.  Psychiatric: She has a normal mood and affect. Her speech is normal.    ED Course  Procedures (including critical care time) Labs Review Labs Reviewed - No data to display  Imaging Review No results found.   EKG Interpretation None      MDM Vital signs wnl. No exam finding that suggest  Ludwig's angina. Rx for clindamycin given. Pt has meds for pain. I have strongly suggested to pt to see a dentist.   Final diagnoses:  None    *I have reviewed nursing notes, vital signs, and all appropriate lab and  imaging results for this patient.Lenox Ahr, PA-C 03/28/14 2348

## 2014-03-29 NOTE — ED Provider Notes (Signed)
Medical screening examination/treatment/procedure(s) were performed by non-physician practitioner and as supervising physician I was immediately available for consultation/collaboration.   EKG Interpretation None        Julianne Rice, MD 03/29/14 [phone]

## 2014-07-22 ENCOUNTER — Emergency Department (HOSPITAL_COMMUNITY): Payer: BC Managed Care – PPO

## 2014-07-22 ENCOUNTER — Emergency Department (HOSPITAL_COMMUNITY)
Admission: EM | Admit: 2014-07-22 | Discharge: 2014-07-22 | Disposition: A | Discharge status: Home / Self Care | Payer: BC Managed Care – PPO | Attending: Emergency Medicine | Admitting: Emergency Medicine

## 2014-07-22 ENCOUNTER — Encounter (HOSPITAL_COMMUNITY): Payer: Self-pay

## 2014-07-22 DIAGNOSIS — I951 Orthostatic hypotension: Secondary | ICD-10-CM | POA: Diagnosis not present

## 2014-07-22 DIAGNOSIS — R55 Syncope and collapse: Secondary | ICD-10-CM

## 2014-07-22 DIAGNOSIS — Z872 Personal history of diseases of the skin and subcutaneous tissue: Secondary | ICD-10-CM | POA: Diagnosis not present

## 2014-07-22 DIAGNOSIS — E669 Obesity, unspecified: Secondary | ICD-10-CM | POA: Diagnosis not present

## 2014-07-22 DIAGNOSIS — Z880 Allergy status to penicillin: Secondary | ICD-10-CM | POA: Insufficient documentation

## 2014-07-22 DIAGNOSIS — Z8739 Personal history of other diseases of the musculoskeletal system and connective tissue: Secondary | ICD-10-CM | POA: Diagnosis not present

## 2014-07-22 DIAGNOSIS — G8929 Other chronic pain: Secondary | ICD-10-CM | POA: Insufficient documentation

## 2014-07-22 DIAGNOSIS — F419 Anxiety disorder, unspecified: Secondary | ICD-10-CM | POA: Insufficient documentation

## 2014-07-22 DIAGNOSIS — E860 Dehydration: Secondary | ICD-10-CM | POA: Diagnosis not present

## 2014-07-22 DIAGNOSIS — R05 Cough: Secondary | ICD-10-CM | POA: Insufficient documentation

## 2014-07-22 DIAGNOSIS — R059 Cough, unspecified: Secondary | ICD-10-CM

## 2014-07-22 DIAGNOSIS — Z720 Tobacco use: Secondary | ICD-10-CM | POA: Diagnosis not present

## 2014-07-22 DIAGNOSIS — Z8719 Personal history of other diseases of the digestive system: Secondary | ICD-10-CM | POA: Diagnosis not present

## 2014-07-22 DIAGNOSIS — R42 Dizziness and giddiness: Secondary | ICD-10-CM | POA: Diagnosis present

## 2014-07-22 DIAGNOSIS — Z79899 Other long term (current) drug therapy: Secondary | ICD-10-CM | POA: Diagnosis not present

## 2014-07-22 LAB — I-STAT CHEM 8, ED
BUN: 22 mg/dL (ref 6–23)
Calcium, Ion: 1.14 mmol/L (ref 1.12–1.23)
Chloride: 100 mEq/L (ref 96–112)
Creatinine, Ser: 1.2 mg/dL — ABNORMAL HIGH (ref 0.50–1.10)
Glucose, Bld: 106 mg/dL — ABNORMAL HIGH (ref 70–99)
HCT: 41 % (ref 36.0–46.0)
Hemoglobin: 13.9 g/dL (ref 12.0–15.0)
Potassium: 3.6 mmol/L (ref 3.5–5.1)
Sodium: 136 mmol/L (ref 135–145)
TCO2: 24 mmol/L (ref 0–100)

## 2014-07-22 LAB — I-STAT BETA HCG BLOOD, ED (MC, WL, AP ONLY): I-stat hCG, quantitative: <5 m[IU]/mL (ref <5)

## 2014-07-22 MED ORDER — ALBUTEROL SULFATE (2.5 MG/3ML) 0.083% IN NEBU
2.5000 mg | INHALATION_SOLUTION | Freq: Once | RESPIRATORY_TRACT | Status: AC
  Administered 2014-07-22: 2.5 mg via RESPIRATORY_TRACT
  Filled 2014-07-22: qty 3

## 2014-07-22 MED ORDER — SODIUM CHLORIDE 0.9 % IV BOLUS (SEPSIS)
1000.0000 mL | Freq: Once | INTRAVENOUS | Status: AC
Start: 2014-07-22 — End: 2014-07-22
  Administered 2014-07-22: 1000 mL via INTRAVENOUS

## 2014-07-22 MED ORDER — SODIUM CHLORIDE 0.9 % IV BOLUS (SEPSIS)
1000.0000 mL | Freq: Once | INTRAVENOUS | Status: AC
  Administered 2014-07-22: 1000 mL via INTRAVENOUS

## 2014-07-22 MED ORDER — IPRATROPIUM-ALBUTEROL 0.5-2.5 (3) MG/3ML IN SOLN
3.0000 mL | Freq: Once | RESPIRATORY_TRACT | Status: AC
  Administered 2014-07-22: 3 mL via RESPIRATORY_TRACT
  Filled 2014-07-22: qty 3

## 2014-07-22 NOTE — ED Notes (Signed)
Patient ambulated to bathroom and back to room with out difficulty. MD aware.

## 2014-07-22 NOTE — ED Notes (Signed)
Patient required standby assist when standing. Rocking back and forth. States that she feels dizzy and unsteady.

## 2014-07-22 NOTE — ED Notes (Signed)
Patient states that she was at work tonight and became dizzy and almost passed out. States that she had a similar episode on Christmas and fell in the floor. States that her fingers feel numb and her tongue feels funny. Feel "fuzzy" per pt.

## 2014-07-22 NOTE — ED Provider Notes (Signed)
CSN: [id number]     Arrival date & time 07/22/14  0145 History   First MD Initiated Contact with Patient 07/22/14 0203     Chief Complaint  Patient presents with  . Dizziness   Patient is a 43 y.o. female presenting with dizziness. The history is provided by the patient and a relative.  Dizziness Quality:  Lightheadedness Severity:  Moderate Onset quality:  Sudden Progression:  Improving Chronicity:  Recurrent Context: standing up   Relieved by:  None tried Worsened by:  Nothing tried Associated symptoms: no blood in stool, no chest pain, no diarrhea, no headaches, no shortness of breath and no vomiting   Risk factors: no hx of stroke   Patient was at work when she felt lightheaded and she thought she was going to pass out  She did not hit the ground She had been standing up when this occurred No CP/SOB. No HA.  No focal weakness.  No new visual loss.   She reports similar episode (4th episode) over past several weeks She reports this occurred on 07/16/14 and she "bumped" her head during this episode but no injury reported She had reports some diffuse numbness but improved She has seen her PCP who felt it was due to her BP medications and this was adjusted  She denies any known cardiac complications in the past  Medications include: Xanax Lisinopril 20mg  daily Belviq for obesity Oxycodone for chronic pain   Past Medical History  Diagnosis Date  . HBP (high blood pressure)   . DDD (degenerative disc disease)   . Ulcer disease   . Hernia   . Chronic pain   . Anxiety    Past Surgical History  Procedure Laterality Date  . Mastectomy, partial      x 2  . Cholecystectomy    . Appendectomy    . Carpal tunnel release      bilateral  . Back surgery      x 4  . Rotator cuff repair      right  . Tubal ligation    . Mouth surgery    . Esophagogastroduodenoscopy  03/06/2010    OBS:JGGE quadrant distal esophageal/patent tubular esophagus/small HH, antral erosions and  ulcerations. Bx negative for persistent H.pylori  . Colonoscopy  10/20/2009    ZMO:QHUTMLY anal canal/left-side diverticula and multiple polyps. poor prep compromised exam. Next TCS 09/2014.  Marland Kitchen Esophagogastroduodenoscopy  10/20/2009    YTK:PTWSF HH/prepyloric antral ulcer s/p bx (+h.pylori), ERE  . Intrauterine device insertion     Family History  Problem Relation Age of Onset  . Heart disease    . Arthritis    . Cancer    . Asthma    . Diabetes     History  Substance Use Topics  . Smoking status: Current Every Day Smoker -- 1.00 packs/day    Types: Cigarettes  . Smokeless tobacco: Not on file  . Alcohol Use: No   OB History    No data available     Review of Systems  Constitutional: Negative for fever.  Respiratory: Positive for cough. Negative for shortness of breath.   Cardiovascular: Negative for chest pain.  Gastrointestinal: Negative for vomiting, diarrhea and blood in stool.  Genitourinary: Negative for hematuria and vaginal bleeding.  Neurological: Positive for dizziness. Negative for headaches.  All other systems reviewed and are negative.     Allergies  Amoxicillin and Aspirin  Home Medications   Prior to Admission medications   Medication Sig Start Date End  Date Taking? Authorizing Provider  ALPRAZolam Duanne Moron) 1 MG tablet Take 1 mg by mouth 3 (three) times daily as needed for sleep or anxiety. For anxiety   Yes Historical Provider, MD  citalopram (CELEXA) 20 MG tablet Take 20 mg by mouth daily.   Yes Historical Provider, MD  Oxycodone HCl 10 MG TABS Take 10 mg by mouth 2 (two) times daily as needed. For pain   Yes Historical Provider, MD   BP 94/66 mmHg  Pulse 71  Temp(Src) 97.7 F (36.5 C) (Oral)  Resp 16  Ht 5\' 5"  (1.651 m)  Wt 233 lb (105.688 kg)  BMI 38.77 kg/m2  SpO2 97% Physical Exam CONSTITUTIONAL: Well developed/well nourished HEAD: Normocephalic/atraumatic EYES: EOMI/PERRL, no ptosis noted ENMT: Mucous membranes moist, No evidence of  facial/nasal trauma NECK: supple no meningeal signs SPINE/BACK:entire spine nontender  CV: S1/S2 noted, no murmurs/rubs/gallops noted LUNGS: Lungs are clear to auscultation bilaterally, no apparent distress ABDOMEN: soft, nontender, no rebound or guarding, bowel sounds noted throughout abdomen GU:no cva tenderness NEURO: Pt is awake/alert/appropriate, moves all extremitiesx4.  No facial droop.   No arm/leg drift.  EXTREMITIES: pulses normal/equal, full ROM, no deformity, All extremities/joints palpated/ranged and nontender SKIN: warm, color normal PSYCH: no abnormalities of mood noted  ED Course  Procedures   2:48 AM Pt with near syncopal episode while at work EKG unremarkable She has no previous cardiac history She did have hypotension here, will give IV fluids I suspect this may be related to her medications She was advised to stop her Lisinopril 3:57 AM BP is improving, however while sleeping her pulse ox will drop She is a smoker and reported recent cough Will obtain CXR and give nebulizer treatment 4:31 AM Pt feels improved No hypoxia on re-assessment She is ambulatory without difficulty I doubt ACS/PE at this time.  Suspect overmedication as cause of hypotension Discussed strict return precautions and need for PCP evaluation next week after holiday Labs Review Labs Reviewed  I-STAT CHEM 8, ED - Abnormal; Notable for the following:    Creatinine, Ser 1.20 (*)    Glucose, Bld 106 (*)    All other components within normal limits  I-STAT BETA HCG BLOOD, ED ([facility], [facility], [facility] ONLY)      EKG Interpretation   Date/Time:  Thursday July 22 2014 02:02:38 EST Ventricular Rate:  71 PR Interval:  156 QRS Duration: 97 QT Interval:  436 QTC Calculation: 474 R Axis:   69 Text Interpretation:  Sinus rhythm Low voltage, precordial leads artifact  noted Confirmed by Christy Gentles  MD, Emran Molzahn ([id number]) on 07/22/2014 2:39:06 AM     Medications  sodium chloride 0.9 % bolus 1,000 mL (0  mLs Intravenous Stopped 07/22/14 0315)  sodium chloride 0.9 % bolus 1,000 mL (0 mLs Intravenous Stopped 07/22/14 0424)  ipratropium-albuterol (DUONEB) 0.5-2.5 (3) MG/3ML nebulizer solution 3 mL (3 mLs Nebulization Given 07/22/14 0401)  albuterol (PROVENTIL) (2.5 MG/3ML) 0.083% nebulizer solution 2.5 mg (2.5 mg Nebulization Given 07/22/14 0401)     MDM   Final diagnoses:  Cough  Near syncope  Dehydration  Orthostatic hypotension    Nursing notes including past medical history and social history reviewed and considered in documentation Labs/vital reviewed myself and considered during evaluation xrays/imaging reviewed by myself and considered during evaluation     Sharyon Cable, MD 07/22/14 [phone]

## 2014-07-22 NOTE — Discharge Instructions (Signed)
PLEASE STOP YOUR LISINOPRIL AND FOLLOWUP WITH YOUR REGULAR DOCTOR NEXT WEEK

## 2014-08-01 ENCOUNTER — Emergency Department (HOSPITAL_COMMUNITY): Payer: Self-pay

## 2014-08-01 ENCOUNTER — Encounter (HOSPITAL_COMMUNITY): Payer: Self-pay | Admitting: *Deleted

## 2014-08-01 ENCOUNTER — Emergency Department (HOSPITAL_COMMUNITY)
Admission: EM | Admit: 2014-08-01 | Discharge: 2014-08-01 | Disposition: A | Discharge status: Home / Self Care | Payer: Self-pay | Attending: Emergency Medicine | Admitting: Emergency Medicine

## 2014-08-01 DIAGNOSIS — Z720 Tobacco use: Secondary | ICD-10-CM | POA: Insufficient documentation

## 2014-08-01 DIAGNOSIS — G8929 Other chronic pain: Secondary | ICD-10-CM | POA: Insufficient documentation

## 2014-08-01 DIAGNOSIS — R059 Cough, unspecified: Secondary | ICD-10-CM

## 2014-08-01 DIAGNOSIS — R05 Cough: Secondary | ICD-10-CM | POA: Insufficient documentation

## 2014-08-01 DIAGNOSIS — R55 Syncope and collapse: Secondary | ICD-10-CM | POA: Insufficient documentation

## 2014-08-01 DIAGNOSIS — R5383 Other fatigue: Secondary | ICD-10-CM | POA: Insufficient documentation

## 2014-08-01 DIAGNOSIS — F419 Anxiety disorder, unspecified: Secondary | ICD-10-CM | POA: Insufficient documentation

## 2014-08-01 DIAGNOSIS — Z79899 Other long term (current) drug therapy: Secondary | ICD-10-CM | POA: Insufficient documentation

## 2014-08-01 DIAGNOSIS — Z8719 Personal history of other diseases of the digestive system: Secondary | ICD-10-CM | POA: Insufficient documentation

## 2014-08-01 DIAGNOSIS — Z872 Personal history of diseases of the skin and subcutaneous tissue: Secondary | ICD-10-CM | POA: Insufficient documentation

## 2014-08-01 LAB — CBC WITH DIFFERENTIAL/PLATELET
Basophils Absolute: 0 10*3/uL (ref 0.0–0.1)
Basophils Relative: 0 % (ref 0–1)
Eosinophils Absolute: 0.2 10*3/uL (ref 0.0–0.7)
Eosinophils Relative: 2 % (ref 0–5)
HCT: 37.4 % (ref 36.0–46.0)
Hemoglobin: 12.5 g/dL (ref 12.0–15.0)
Lymphocytes Relative: 33 % (ref 12–46)
Lymphs Abs: 3.2 10*3/uL (ref 0.7–4.0)
MCH: 31.4 pg (ref 26.0–34.0)
MCHC: 33.4 g/dL (ref 30.0–36.0)
MCV: 94 fL (ref 78.0–100.0)
Monocytes Absolute: 0.7 10*3/uL (ref 0.1–1.0)
Monocytes Relative: 7 % (ref 3–12)
Neutro Abs: 5.6 10*3/uL (ref 1.7–7.7)
Neutrophils Relative %: 58 % (ref 43–77)
Platelets: 242 10*3/uL (ref 150–400)
RBC: 3.98 MIL/uL (ref 3.87–5.11)
RDW: 13.3 % (ref 11.5–15.5)
WBC: 9.8 10*3/uL (ref 4.0–10.5)

## 2014-08-01 LAB — COMPREHENSIVE METABOLIC PANEL
ALT: 24 U/L (ref 0–35)
AST: 20 U/L (ref 0–37)
Albumin: 3.8 g/dL (ref 3.5–5.2)
Alkaline Phosphatase: 71 U/L (ref 39–117)
Anion gap: 6 (ref 5–15)
BUN: 14 mg/dL (ref 6–23)
CO2: 26 mmol/L (ref 19–32)
Calcium: 9 mg/dL (ref 8.4–10.5)
Chloride: 105 mEq/L (ref 96–112)
Creatinine, Ser: 0.68 mg/dL (ref 0.50–1.10)
GFR calc Af Amer: >90 mL/min (ref >90)
GFR calc non Af Amer: >90 mL/min (ref >90)
Glucose, Bld: 122 mg/dL — ABNORMAL HIGH (ref 70–99)
Potassium: 3.3 mmol/L — ABNORMAL LOW (ref 3.5–5.1)
Sodium: 137 mmol/L (ref 135–145)
Total Bilirubin: 0.5 mg/dL (ref 0.3–1.2)
Total Protein: 7 g/dL (ref 6.0–8.3)

## 2014-08-01 LAB — URINALYSIS, ROUTINE W REFLEX MICROSCOPIC
Bilirubin Urine: NEGATIVE
Glucose, UA: NEGATIVE mg/dL
Ketones, ur: NEGATIVE mg/dL
Leukocytes, UA: NEGATIVE
Nitrite: NEGATIVE
Protein, ur: NEGATIVE mg/dL
Specific Gravity, Urine: 1.025 (ref 1.005–1.030)
Urobilinogen, UA: 0.2 mg/dL (ref 0.0–1.0)
pH: 5.5 (ref 5.0–8.0)

## 2014-08-01 LAB — CK: Total CK: 82 U/L (ref 7–177)

## 2014-08-01 LAB — URINE MICROSCOPIC-ADD ON

## 2014-08-01 LAB — TROPONIN I: Troponin I: <0.03 ng/mL (ref <0.031)

## 2014-08-01 LAB — PREGNANCY, URINE: Preg Test, Ur: NEGATIVE

## 2014-08-01 LAB — D-DIMER, QUANTITATIVE: D-Dimer, Quant: 0.48 ug/mL-FEU (ref 0.00–0.48)

## 2014-08-01 LAB — ETHANOL: Alcohol, Ethyl (B): <5 mg/dL (ref 0–9)

## 2014-08-01 MED ORDER — SODIUM CHLORIDE 0.9 % IV BOLUS (SEPSIS)
1000.0000 mL | Freq: Once | INTRAVENOUS | Status: AC
  Administered 2014-08-01: 1000 mL via INTRAVENOUS

## 2014-08-01 MED ORDER — ONDANSETRON HCL 4 MG/2ML IJ SOLN
4.0000 mg | Freq: Once | INTRAMUSCULAR | Status: AC
  Administered 2014-08-01: 4 mg via INTRAVENOUS
  Filled 2014-08-01: qty 2

## 2014-08-01 NOTE — ED Notes (Signed)
PT ambulated to bathroom with NAD noted and no alterations in gait noted.

## 2014-08-01 NOTE — Discharge Instructions (Signed)
Near-Syncope Keep yourself hydrated. Follow-up with the cardiologist as discussed to get a heart monitor. Return to the ED develop chest pain, shortness of breath or any other concerns. Near-syncope (commonly known as near fainting) is sudden weakness, dizziness, or feeling like you might pass out. During an episode of near-syncope, you may also develop pale skin, have tunnel vision, or feel sick to your stomach (nauseous). Near-syncope may occur when getting up after sitting or while standing for a long time. It is caused by a sudden decrease in blood flow to the brain. This decrease can result from various causes or triggers, most of which are not serious. However, because near-syncope can sometimes be a sign of something serious, a medical evaluation is required. The specific cause is often not determined. HOME CARE INSTRUCTIONS  Monitor your condition for any changes. The following actions may help to alleviate any discomfort you are experiencing:  Have someone stay with you until you feel stable.  Lie down right away and prop your feet up if you start feeling like you might faint. Breathe deeply and steadily. Wait until all the symptoms have passed. Most of these episodes last only a few minutes. You may feel tired for several hours.   Drink enough fluids to keep your urine clear or pale yellow.   If you are taking blood pressure or heart medicine, get up slowly when seated or lying down. Take several minutes to sit and then stand. This can reduce dizziness.  Follow up with your health care provider as directed. SEEK IMMEDIATE MEDICAL CARE IF:   You have a severe headache.   You have unusual pain in the chest, abdomen, or back.   You are bleeding from the mouth or rectum, or you have black or tarry stool.   You have an irregular or very fast heartbeat.   You have repeated fainting or have seizure-like jerking during an episode.   You faint when sitting or lying down.   You  have confusion.   You have difficulty walking.   You have severe weakness.   You have vision problems.  MAKE SURE YOU:   Understand these instructions.  Will watch your condition.  Will get help right away if you are not doing well or get worse. Document Released: 07/09/2005 Document Revised: 07/14/2013 Document Reviewed: 12/12/2012 [facility] Patient Information 2015 [place], [place]. This information is not intended to replace advice given to you by your health care provider. Make sure you discuss any questions you have with your health care provider.

## 2014-08-01 NOTE — ED Notes (Signed)
Pt able to sip on ice/water.

## 2014-08-01 NOTE — ED Provider Notes (Signed)
CSN: [id number]     Arrival date & time 08/01/14  6967 History   First MD Initiated Contact with Patient 08/01/14 [phone]     Chief Complaint  Patient presents with  . 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 States she was standing and began to feel lightheaded, dizzy with room spinning and had a sit down. She did not hit the ground. No chest pain or shortness of breath. No headache. She has had recurrent near syncopal episodes over the past month and been taken off her blood pressure medicine. She no longer takes a medication for her blood pressure. She reports hitting her head at home prior to going to work. She denies any focal weakness, numbness or tingling. No bowel or bladder incontinence. No tongue biting. She endorses diffuse blurry vision. She was seen on the ED in December 31 for similar symptoms and near syncope was thought to be medication related.  The history is provided by the patient.    Past Medical History  Diagnosis Date  . HBP (high blood pressure)   . DDD (degenerative disc disease)   . Ulcer disease   . Hernia   . Chronic pain   . Anxiety    Past Surgical History  Procedure Laterality Date  . Mastectomy, partial      x 2  . Cholecystectomy    . Appendectomy    . Carpal tunnel release      bilateral  . Back surgery      x 4  . Rotator cuff repair      right  . Tubal ligation    . Mouth surgery    . Esophagogastroduodenoscopy  03/06/2010    BOF:BPZW quadrant distal esophageal/patent tubular esophagus/small HH, antral erosions and ulcerations. Bx negative for persistent H.pylori  . Colonoscopy  10/20/2009    CHE:NIDPOEU anal canal/left-side diverticula and multiple polyps. poor prep compromised exam. Next TCS 09/2014.  Marland Kitchen Esophagogastroduodenoscopy  10/20/2009    MPN:TIRWE  HH/prepyloric antral ulcer s/p bx (+h.pylori), ERE  . Intrauterine device insertion     Family History  Problem Relation Age of Onset  . Heart disease    . Arthritis    . Cancer    . Asthma    . Diabetes     History  Substance Use Topics  . Smoking status: Current Every Day Smoker -- 1.00 packs/day    Types: Cigarettes  . Smokeless tobacco: Not on file  . Alcohol Use: No   OB History    No data available     Review of Systems  Constitutional: Positive for fatigue. Negative for fever, activity change and appetite change.  HENT: Negative for congestion and rhinorrhea.   Respiratory: Positive for cough. Negative for chest tightness and shortness of breath.   Cardiovascular: Positive for near-syncope. Negative for chest pain.  Gastrointestinal: Negative for nausea, vomiting and abdominal pain.  Genitourinary: Negative for dysuria and hematuria.  Musculoskeletal: Negative for myalgias, back pain and arthralgias.  Skin: Negative for rash.  Neurological: Positive for dizziness, syncope, weakness and light-headedness. Negative for numbness.  A complete 10 system review of systems was obtained and all systems are negative except as noted in the HPI and PMH.      Allergies  Amoxicillin and Aspirin  Home Medications   Prior to Admission medications   Medication Sig Start Date End Date Taking?  Authorizing Provider  ALPRAZolam Duanne Moron) 1 MG tablet Take 1 mg by mouth 3 (three) times daily as needed for sleep or anxiety.    Yes Historical Provider, MD  Oxycodone HCl 10 MG TABS Take 10 mg by mouth 2 (two) times daily as needed (pain).    Yes Historical Provider, MD   BP 113/77 mmHg  Pulse 85  Temp(Src) 98.3 F (36.8 C) (Oral)  Resp 18  Ht 5\' 3"  (1.6 m)  Wt 230 lb (104.327 kg)  BMI 40.75 kg/m2  SpO2 96%  LMP  Physical Exam  Constitutional: She is oriented to person, place, and time. She appears well-developed and well-nourished. No distress.  HENT:  Head: Normocephalic and  atraumatic.  Mouth/Throat: Oropharynx is clear and moist. No oropharyngeal exudate.  Eyes: Conjunctivae and EOM are normal. Pupils are equal, round, and reactive to light.  Neck: Normal range of motion. Neck supple.  No meningismus.  Cardiovascular: Normal rate, regular rhythm, normal heart sounds and intact distal pulses.   No murmur heard. Pulmonary/Chest: Effort normal and breath sounds normal. No respiratory distress.  Abdominal: Soft. There is no tenderness. There is no rebound and no guarding.  Musculoskeletal: Normal range of motion. She exhibits no edema or tenderness.  Neurological: She is alert and oriented to person, place, and time. No cranial nerve deficit. She exhibits normal muscle tone. Coordination normal.  No ataxia on finger to nose bilaterally. No pronator drift. 5/5 strength throughout. CN 2-12 intact. Negative Romberg. Equal grip strength. Sensation intact. Gait is normal.   Skin: Skin is warm.  Psychiatric: She has a normal mood and affect. Her behavior is normal.  Nursing note and vitals reviewed.   ED Course  Procedures (including critical care time) Labs Review Labs Reviewed  COMPREHENSIVE METABOLIC PANEL - Abnormal; Notable for the following:    Potassium 3.3 (*)    Glucose, Bld 122 (*)    All other components within normal limits  URINALYSIS, ROUTINE W REFLEX MICROSCOPIC - Abnormal; Notable for the following:    APPearance HAZY (*)    Hgb urine dipstick LARGE (*)    All other components within normal limits  URINE MICROSCOPIC-ADD ON - Abnormal; Notable for the following:    Squamous Epithelial / LPF FEW (*)    Bacteria, UA FEW (*)    Crystals CA OXALATE CRYSTALS (*)    All other components within normal limits  CBC WITH DIFFERENTIAL  TROPONIN I  PREGNANCY, URINE  ETHANOL  CK  D-DIMER, QUANTITATIVE    Imaging Review Dg Chest 2 View  08/01/2014   CLINICAL DATA:  Cough  EXAM: CHEST  2 VIEW  COMPARISON:  July 22, 2014  FINDINGS: There is no  edema or consolidation. Heart is upper normal in size with pulmonary vascularity within normal limits. No adenopathy. There is degenerative change in the thoracic spine. There is postoperative change in the lower cervical region.  IMPRESSION: No edema or consolidation.   Electronically Signed   By: Lowella Grip M.D.   On: 08/01/2014 08:32   Ct Head Wo Contrast  08/01/2014   CLINICAL DATA:  Fall, syncope  EXAM: CT HEAD WITHOUT CONTRAST  TECHNIQUE: Contiguous axial images were obtained from the base of the skull through the vertex without intravenous contrast.  COMPARISON:  None.  FINDINGS: No evidence of parenchymal hemorrhage or extra-axial fluid collection. No mass lesion, mass effect, or midline shift.  No CT evidence of acute infarction.  Cerebral volume is within normal limits.  No ventriculomegaly.  The visualized paranasal sinuses are essentially clear. The mastoid air cells are unopacified.  No evidence of calvarial fracture.  IMPRESSION: Normal head CT.   Electronically Signed   By: Julian Hy M.D.   On: 08/01/2014 10:10     EKG Interpretation   Date/Time:  Sunday August 01 2014 06:50:50 EST Ventricular Rate:  78 PR Interval:  146 QRS Duration: 110 QT Interval:  418 QTC Calculation: 476 R Axis:   67 Text Interpretation:  Sinus rhythm Low voltage, precordial leads No  significant change was found Confirmed by Wyvonnia Dusky  MD, Isma Tietje ([id number]) on  08/01/2014 7:03:05 AM      MDM   Final diagnoses:  Syncope  Cough  Near syncope   Recurrent near syncopal episodes. EKG normal sinus rhythm. No Brugada, no prolonged QT  Pregnancy test negative. Hemoglobin stable. Orthostatics negative. Patient given IV fluids.  Labs unremarkable. EKG normal sinus rhythm. D-dimer negative. Doubt ACS or PE.  Patient is ambulatory and tolerating by mouth and denies any dizziness. She is no longer on any medications for her blood pressure. Given her recurrent episodes of near syncope she may  benefit from Holter monitoring testing and will be referred to cardiology.  BP 113/77 mmHg  Pulse 85  Temp(Src) 98.3 F (36.8 C) (Oral)  Resp 18  Ht 5\' 3"  (1.6 m)  Wt 230 lb (104.327 kg)  BMI 40.75 kg/m2  SpO2 96%  LMP        Ezequiel Essex, MD 08/01/14 [phone]

## 2014-08-01 NOTE — ED Notes (Signed)
Pt states she has fallen a couple times tonight and states she has passed out this morning; pt states she sees "sparkly things" before she passes out; pt c/o pain to bridge of her nose and has a knot on top of her head due to the fall.

## 2014-08-18 ENCOUNTER — Encounter: Payer: Self-pay | Admitting: *Deleted

## 2014-08-18 ENCOUNTER — Encounter: Payer: Self-pay | Admitting: Cardiovascular Disease

## 2014-10-13 ENCOUNTER — Encounter: Payer: Self-pay | Admitting: Internal Medicine

## 2015-03-11 ENCOUNTER — Encounter (HOSPITAL_COMMUNITY): Payer: Self-pay | Admitting: Emergency Medicine

## 2015-03-11 ENCOUNTER — Emergency Department (HOSPITAL_COMMUNITY): Payer: Self-pay

## 2015-03-11 ENCOUNTER — Emergency Department (HOSPITAL_COMMUNITY)
Admission: EM | Admit: 2015-03-11 | Discharge: 2015-03-11 | Disposition: A | Discharge status: Home / Self Care | Payer: Self-pay | Attending: Emergency Medicine | Admitting: Emergency Medicine

## 2015-03-11 DIAGNOSIS — Z8739 Personal history of other diseases of the musculoskeletal system and connective tissue: Secondary | ICD-10-CM | POA: Insufficient documentation

## 2015-03-11 DIAGNOSIS — Z8719 Personal history of other diseases of the digestive system: Secondary | ICD-10-CM | POA: Insufficient documentation

## 2015-03-11 DIAGNOSIS — N200 Calculus of kidney: Secondary | ICD-10-CM | POA: Insufficient documentation

## 2015-03-11 DIAGNOSIS — F419 Anxiety disorder, unspecified: Secondary | ICD-10-CM | POA: Insufficient documentation

## 2015-03-11 DIAGNOSIS — G8929 Other chronic pain: Secondary | ICD-10-CM | POA: Insufficient documentation

## 2015-03-11 DIAGNOSIS — Z880 Allergy status to penicillin: Secondary | ICD-10-CM | POA: Insufficient documentation

## 2015-03-11 DIAGNOSIS — Z720 Tobacco use: Secondary | ICD-10-CM | POA: Insufficient documentation

## 2015-03-11 DIAGNOSIS — R109 Unspecified abdominal pain: Secondary | ICD-10-CM

## 2015-03-11 LAB — URINALYSIS, ROUTINE W REFLEX MICROSCOPIC
Bilirubin Urine: NEGATIVE
Glucose, UA: NEGATIVE mg/dL
Hgb urine dipstick: NEGATIVE
Ketones, ur: NEGATIVE mg/dL
Leukocytes, UA: NEGATIVE
Nitrite: NEGATIVE
Protein, ur: NEGATIVE mg/dL
Specific Gravity, Urine: 1.015 (ref 1.005–1.030)
Urobilinogen, UA: 0.2 mg/dL (ref 0.0–1.0)
pH: 7 (ref 5.0–8.0)

## 2015-03-11 LAB — CBC WITH DIFFERENTIAL/PLATELET
Basophils Absolute: 0.1 10*3/uL (ref 0.0–0.1)
Basophils Relative: 1 % (ref 0–1)
Eosinophils Absolute: 0.2 10*3/uL (ref 0.0–0.7)
Eosinophils Relative: 2 % (ref 0–5)
HCT: 38.7 % (ref 36.0–46.0)
Hemoglobin: 13.2 g/dL (ref 12.0–15.0)
Lymphocytes Relative: 18 % (ref 12–46)
Lymphs Abs: 2 10*3/uL (ref 0.7–4.0)
MCH: 31.8 pg (ref 26.0–34.0)
MCHC: 34.1 g/dL (ref 30.0–36.0)
MCV: 93.3 fL (ref 78.0–100.0)
Monocytes Absolute: 0.8 10*3/uL (ref 0.1–1.0)
Monocytes Relative: 7 % (ref 3–12)
Neutro Abs: 7.9 10*3/uL — ABNORMAL HIGH (ref 1.7–7.7)
Neutrophils Relative %: 72 % (ref 43–77)
Platelets: 231 10*3/uL (ref 150–400)
RBC: 4.15 MIL/uL (ref 3.87–5.11)
RDW: 12.5 % (ref 11.5–15.5)
WBC: 10.8 10*3/uL — ABNORMAL HIGH (ref 4.0–10.5)

## 2015-03-11 LAB — BASIC METABOLIC PANEL
Anion gap: 8 (ref 5–15)
BUN: 17 mg/dL (ref 6–20)
CO2: 21 mmol/L — ABNORMAL LOW (ref 22–32)
Calcium: 8.7 mg/dL — ABNORMAL LOW (ref 8.9–10.3)
Chloride: 108 mmol/L (ref 101–111)
Creatinine, Ser: 0.77 mg/dL (ref 0.44–1.00)
GFR calc Af Amer: >60 mL/min (ref >60)
GFR calc non Af Amer: >60 mL/min (ref >60)
Glucose, Bld: 120 mg/dL — ABNORMAL HIGH (ref 65–99)
Potassium: 3.4 mmol/L — ABNORMAL LOW (ref 3.5–5.1)
Sodium: 137 mmol/L (ref 135–145)

## 2015-03-11 LAB — PREGNANCY, URINE: Preg Test, Ur: NEGATIVE

## 2015-03-11 MED ORDER — TAMSULOSIN HCL 0.4 MG PO CAPS: 0.4000 mg | ORAL_CAPSULE | Freq: Every day | ORAL | Status: DC

## 2015-03-11 MED ORDER — ONDANSETRON HCL 4 MG/2ML IJ SOLN
4.0000 mg | Freq: Once | INTRAMUSCULAR | Status: AC
  Administered 2015-03-11: 4 mg via INTRAVENOUS
  Filled 2015-03-11: qty 2

## 2015-03-11 MED ORDER — ONDANSETRON HCL 4 MG PO TABS: 4.0000 mg | ORAL_TABLET | Freq: Four times a day (QID) | ORAL | Status: DC

## 2015-03-11 MED ORDER — PHENAZOPYRIDINE HCL 100 MG PO TABS
100.0000 mg | ORAL_TABLET | Freq: Once | ORAL | Status: AC
  Administered 2015-03-11: 100 mg via ORAL
  Filled 2015-03-11: qty 1

## 2015-03-11 MED ORDER — OXYCODONE-ACETAMINOPHEN 5-325 MG PO TABS
1.0000 | ORAL_TABLET | ORAL | Status: DC | PRN
Start: 2015-03-11 — End: 2015-09-19

## 2015-03-11 MED ORDER — LORAZEPAM 2 MG/ML IJ SOLN
1.0000 mg | Freq: Once | INTRAMUSCULAR | Status: AC
  Administered 2015-03-11: 1 mg via INTRAVENOUS
  Filled 2015-03-11: qty 1

## 2015-03-11 MED ORDER — MORPHINE SULFATE (PF) 4 MG/ML IV SOLN
4.0000 mg | Freq: Once | INTRAVENOUS | Status: AC
  Administered 2015-03-11: 4 mg via INTRAVENOUS
  Filled 2015-03-11: qty 1

## 2015-03-11 NOTE — Discharge Instructions (Signed)

## 2015-03-11 NOTE — ED Notes (Signed)
Patient complaining of left flank pain and unable to urinate. States she woke up at 1230 with sudden onset of pain and feeling like she needs to void but unable to.

## 2015-03-11 NOTE — ED Provider Notes (Signed)
CSN: [id number]     Arrival date & time 03/11/15  1300 History   First MD Initiated Contact with Patient 03/11/15 1312     Chief Complaint  Patient presents with  . Flank Pain     (Consider location/radiation/quality/duration/timing/severity/associated sxs/prior Treatment) HPI Comments: Patient works third shift and woke up about an hour ago with left flank pain and urge to urinate. She states she is unable to urinate. She reports constant pain in her left flank that radiates around to her left side and bladder. She reports pain with urination but denies any blood in the urine. Feels like she Needs to urinate but cannot. Denies any nausea or vomiting. No fever. No diarrhea. History of tonic back problems with multiple surgeries. No focal weakness, numbness or tingling. No incontinence.  The history is provided by the patient.    Past Medical History  Diagnosis Date  . HBP (high blood pressure)   . DDD (degenerative disc disease)   . Ulcer disease   . Hernia   . Chronic pain   . Anxiety    Past Surgical History  Procedure Laterality Date  . Mastectomy, partial      x 2  . Cholecystectomy    . Appendectomy    . Carpal tunnel release      bilateral  . Back surgery      x 4  . Rotator cuff repair      right  . Tubal ligation    . Mouth surgery    . Esophagogastroduodenoscopy  03/06/2010    [id number] quadrant distal esophageal/patent tubular esophagus/small HH, antral erosions and ulcerations. Bx negative for persistent H.pylori  . Colonoscopy  10/20/2009    [id number] anal canal/left-side diverticula and multiple polyps. poor prep compromised exam. Next TCS 09/2014.  Marland Kitchen Esophagogastroduodenoscopy  10/20/2009    [id number] HH/prepyloric antral ulcer s/p bx (+h.pylori), ERE  . Intrauterine device insertion     Family History  Problem Relation Age of Onset  . Heart disease    . Arthritis    . Cancer    . Asthma    . Diabetes     Social History  Substance Use Topics  .  Smoking status: Current Every Day Smoker -- 1.00 packs/day    Types: Cigarettes  . Smokeless tobacco: None  . Alcohol Use: No   OB History    No data available     Review of Systems  Constitutional: Negative for fever and appetite change.  Respiratory: Negative for cough, chest tightness and shortness of breath.   Cardiovascular: Negative for chest pain.  Gastrointestinal: Negative for nausea, vomiting and abdominal pain.  Genitourinary: Positive for dysuria, urgency, frequency, flank pain and decreased urine volume. Negative for vaginal bleeding and vaginal discharge.  Musculoskeletal: Positive for back pain. Negative for myalgias and arthralgias.  Skin: Negative for rash.  Neurological: Negative for dizziness, weakness and headaches.  A complete 10 system review of systems was obtained and all systems are negative except as noted in the HPI and PMH.      Allergies  Amoxicillin and Aspirin  Home Medications   Prior to Admission medications   Medication Sig Start Date End Date Taking? Authorizing Provider  ALPRAZolam Duanne Moron) 1 MG tablet Take 1 mg by mouth 3 (three) times daily as needed for sleep or anxiety.     Historical Provider, MD  Oxycodone HCl 10 MG TABS Take 10 mg by mouth 2 (two) times daily as needed (pain).     Historical  Provider, MD   BP 147/94 mmHg  Pulse 90  Temp(Src) 97.8 F (36.6 C) (Oral)  Resp 18  Ht 5\' 4"  (1.626 m)  Wt 188 lb (85.276 kg)  BMI 32.25 kg/m2  SpO2 100% Physical Exam  Constitutional: She is oriented to person, place, and time. She appears well-developed and well-nourished. She appears distressed.  Anxious, rocking back and forth  HENT:  Head: Normocephalic and atraumatic.  Mouth/Throat: Oropharynx is clear and moist. No oropharyngeal exudate.  Eyes: Conjunctivae and EOM are normal. Pupils are equal, round, and reactive to light.  Neck: Normal range of motion. Neck supple.  No meningismus.  Cardiovascular: Normal rate, regular rhythm,  normal heart sounds and intact distal pulses.   No murmur heard. Pulmonary/Chest: Effort normal and breath sounds normal. No respiratory distress.  Abdominal: Soft. There is no tenderness. There is no rebound and no guarding.  Musculoskeletal: Normal range of motion. She exhibits tenderness. She exhibits no edema.  Left CVA tenderness 5/5 strength in bilateral lower extremities. Ankle plantar and dorsiflexion intact. Great toe extension intact bilaterally. +2 DP and PT pulses. Normal gait.   Neurological: She is alert and oriented to person, place, and time. No cranial nerve deficit. She exhibits normal muscle tone. Coordination normal.  No ataxia on finger to nose bilaterally. No pronator drift. 5/5 strength throughout. CN 2-12 intact. Negative Romberg. Equal grip strength. Sensation intact. Gait is normal.   Skin: Skin is warm.  Psychiatric: She has a normal mood and affect. Her behavior is normal.  Nursing note and vitals reviewed.   ED Course  Procedures (including critical care time) Labs Review Labs Reviewed  CBC WITH DIFFERENTIAL/PLATELET - Abnormal; Notable for the following:    WBC 10.8 (*)    Neutro Abs 7.9 (*)    All other components within normal limits  URINALYSIS, ROUTINE W REFLEX MICROSCOPIC (NOT AT [facility])  PREGNANCY, URINE  BASIC METABOLIC PANEL    Imaging Review Ct Renal Stone Study  03/11/2015   CLINICAL DATA:  Acute onset left flank pain this morning.  EXAM: CT ABDOMEN AND PELVIS WITHOUT CONTRAST  TECHNIQUE: Multidetector CT imaging of the abdomen and pelvis was performed following the standard protocol without IV contrast.  COMPARISON:  None.  FINDINGS: Lower chest: No acute findings.  Hepatobiliary:  No mass visualized on this unenhanced exam.  Pancreas: No mass or inflammatory process visualized on this unenhanced exam.  Spleen:  Within normal limits in size.  Adrenal Glands:  No masses identified.  Kidneys/Urinary tract: Several tiny calculi are noted in lower pole  of left kidney measuring up to 5 mm. Mild right hydronephrosis and ureterectasis is also demonstrated. A 4 mm calculus is seen at the left ureterovesical junction.  Stomach/Bowel/Peritoneum:  Unremarkable.  Vascular/Lymphatic: No pathologically enlarged lymph nodes identified. No abdominal aortic aneurysm or other significant retroperitoneal abnormality demonstrated.  Reproductive: IUD seen in appropriate position. No mass or other significant abnormality noted.  Other:  None.  Musculoskeletal:  No suspicious bone lesions identified.  IMPRESSION: Mild left hydroureteronephrosis due to 4 mm calculus at the left ureterovesical junction.  Left nephrolithiasis.   Electronically Signed   By: Earle Gell M.D.   On: 03/11/2015 14:50   I have personally reviewed and evaluated these images and lab results as part of my medical decision-making.   EKG Interpretation None      MDM   Final diagnoses:  Flank pain   Flank pain  and urgency. States unable to urinate. No history of kidney  stones. Strength and sensation intact in lower extremities.   Bladder scan shows 14 mL of urine.  Urinalysis is negative. HCG negative.  CT shows distal 4 mm ureteral stone with hydronephrosis. Pain and bladder spasms have improved. UA without signs of infection. No nausea or vomiting.  Patient will be treated for ureteral colic and follow-up with urology. Return precautions discussed.  Ezequiel Essex, MD 03/11/15 1536

## 2015-03-11 NOTE — ED Notes (Signed)
Pt will not come out of bathroom.  States that it feels better to sit on toilet as if urinating.  Pt aware that bladder is empty.  Educated on bladder spasms.

## 2015-08-17 ENCOUNTER — Other Ambulatory Visit (HOSPITAL_COMMUNITY): Payer: Self-pay | Admitting: Physician Assistant

## 2015-08-17 DIAGNOSIS — Z1231 Encounter for screening mammogram for malignant neoplasm of breast: Secondary | ICD-10-CM

## 2015-08-25 ENCOUNTER — Ambulatory Visit (HOSPITAL_COMMUNITY)
Admission: RE | Admit: 2015-08-25 | Discharge: 2015-08-25 | Disposition: A | Discharge status: Home / Self Care | Payer: BLUE CROSS/BLUE SHIELD | Source: Ambulatory Visit | Attending: Physician Assistant | Admitting: Physician Assistant

## 2015-08-25 DIAGNOSIS — Z1231 Encounter for screening mammogram for malignant neoplasm of breast: Secondary | ICD-10-CM | POA: Diagnosis present

## 2015-08-29 ENCOUNTER — Other Ambulatory Visit: Payer: Self-pay | Admitting: Physician Assistant

## 2015-08-29 DIAGNOSIS — R928 Other abnormal and inconclusive findings on diagnostic imaging of breast: Secondary | ICD-10-CM

## 2015-09-05 ENCOUNTER — Ambulatory Visit
Admission: RE | Admit: 2015-09-05 | Discharge: 2015-09-05 | Disposition: A | Discharge status: Home / Self Care | Payer: BLUE CROSS/BLUE SHIELD | Source: Ambulatory Visit | Attending: Physician Assistant | Admitting: Physician Assistant

## 2015-09-05 DIAGNOSIS — R928 Other abnormal and inconclusive findings on diagnostic imaging of breast: Secondary | ICD-10-CM

## 2015-09-11 ENCOUNTER — Emergency Department (HOSPITAL_COMMUNITY): Payer: BLUE CROSS/BLUE SHIELD

## 2015-09-11 ENCOUNTER — Emergency Department (HOSPITAL_COMMUNITY)
Admission: EM | Admit: 2015-09-11 | Discharge: 2015-09-11 | Disposition: A | Discharge status: Home / Self Care | Payer: BLUE CROSS/BLUE SHIELD | Attending: Emergency Medicine | Admitting: Emergency Medicine

## 2015-09-11 ENCOUNTER — Encounter (HOSPITAL_COMMUNITY): Payer: Self-pay | Admitting: Emergency Medicine

## 2015-09-11 DIAGNOSIS — Y998 Other external cause status: Secondary | ICD-10-CM | POA: Diagnosis not present

## 2015-09-11 DIAGNOSIS — Y9389 Activity, other specified: Secondary | ICD-10-CM | POA: Insufficient documentation

## 2015-09-11 DIAGNOSIS — Y9289 Other specified places as the place of occurrence of the external cause: Secondary | ICD-10-CM | POA: Insufficient documentation

## 2015-09-11 DIAGNOSIS — S4991XA Unspecified injury of right shoulder and upper arm, initial encounter: Secondary | ICD-10-CM | POA: Diagnosis present

## 2015-09-11 DIAGNOSIS — I10 Essential (primary) hypertension: Secondary | ICD-10-CM | POA: Diagnosis not present

## 2015-09-11 DIAGNOSIS — S46911A Strain of unspecified muscle, fascia and tendon at shoulder and upper arm level, right arm, initial encounter: Secondary | ICD-10-CM | POA: Diagnosis not present

## 2015-09-11 DIAGNOSIS — F17210 Nicotine dependence, cigarettes, uncomplicated: Secondary | ICD-10-CM | POA: Insufficient documentation

## 2015-09-11 MED ORDER — HYDROCODONE-ACETAMINOPHEN 5-325 MG PO TABS
1.0000 | ORAL_TABLET | Freq: Once | ORAL | Status: AC
  Administered 2015-09-11: 1 via ORAL
  Filled 2015-09-11: qty 1

## 2015-09-11 MED ORDER — HYDROCODONE-ACETAMINOPHEN 5-325 MG PO TABS: ORAL_TABLET | ORAL | Status: DC
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
## 2015-09-12 ENCOUNTER — Other Ambulatory Visit (HOSPITAL_COMMUNITY): Payer: Self-pay | Admitting: Physician Assistant

## 2015-09-12 DIAGNOSIS — R921 Mammographic calcification found on diagnostic imaging of breast: Secondary | ICD-10-CM

## 2015-09-12 DIAGNOSIS — Z09 Encounter for follow-up examination after completed treatment for conditions other than malignant neoplasm: Secondary | ICD-10-CM

## 2015-09-19 ENCOUNTER — Encounter: Payer: Self-pay | Admitting: Orthopedic Surgery

## 2015-09-19 ENCOUNTER — Ambulatory Visit (INDEPENDENT_AMBULATORY_CARE_PROVIDER_SITE_OTHER): Payer: BLUE CROSS/BLUE SHIELD | Admitting: Orthopedic Surgery

## 2015-09-19 VITALS — BP 132/91 | Ht 67.0 in | Wt 221.0 lb

## 2015-09-19 DIAGNOSIS — M75101 Unspecified rotator cuff tear or rupture of right shoulder, not specified as traumatic: Secondary | ICD-10-CM

## 2015-09-19 MED ORDER — OXYCODONE-ACETAMINOPHEN 5-325 MG PO TABS: 1.0000 | ORAL_TABLET | Freq: Four times a day (QID) | ORAL | Status: DC | PRN
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
## 2015-09-19 NOTE — Patient Instructions (Addendum)
We will schedule MRI for you and call you with appointment  Out of work until return appointment

## 2015-09-23 ENCOUNTER — Telehealth: Payer: Self-pay | Admitting: *Deleted

## 2015-09-23 NOTE — Telephone Encounter (Signed)
MRI pending.Office notes faxed to [facility] for nurse reviewer. Awaiting response

## 2015-09-27 ENCOUNTER — Telehealth: Payer: Self-pay | Admitting: *Deleted

## 2015-09-27 NOTE — Telephone Encounter (Signed)
Spoke with patient and advised that I have faxed notes for the nurse to review for MRI approval,  and I am awaiting a response.

## 2015-09-28 ENCOUNTER — Telehealth: Payer: Self-pay | Admitting: Orthopedic Surgery

## 2015-09-28 NOTE — Telephone Encounter (Signed)
Patient has been made aware of status of MRI; initially denied and currently in review; requests refill of medication: oxyCODONE-acetaminophen (PERCOCET/ROXICET) 5-325 MG tablet  And also asked if possible to have her out of work status extended beyond 10/04/15, due to MRI still pending.  Please advise.  Patient's [phone]

## 2015-09-30 NOTE — Telephone Encounter (Signed)
Routing to Dr Harrison for approval 

## 2015-10-01 NOTE — Telephone Encounter (Signed)
No more percocet;  norco 7.5 q6 # 28 Ibuprofen 800 tid # 90 Send to OT for shoulder pain 3 x a week x 4 weeks  Fu in 5 weeks

## 2015-10-03 ENCOUNTER — Telehealth: Payer: Self-pay | Admitting: *Deleted

## 2015-10-03 ENCOUNTER — Other Ambulatory Visit: Payer: Self-pay | Admitting: *Deleted

## 2015-10-03 MED ORDER — HYDROCODONE-ACETAMINOPHEN 7.5-325 MG PO TABS
1.0000 | ORAL_TABLET | Freq: Four times a day (QID) | ORAL | Status: DC | PRN
Start: 2015-10-03 — End: 2017-08-11

## 2015-10-03 MED ORDER — IBUPROFEN 800 MG PO TABS: 800.0000 mg | ORAL_TABLET | Freq: Three times a day (TID) | ORAL | Status: DC

## 2015-10-03 NOTE — Telephone Encounter (Signed)
MRI APPROVED PER FAX AUTHR # [id number] VALID THROUGH 09/23/15 - 10/22/15 SCHEDULED FOR 10/12/15 AT 10:00 AM PATIENT TO ARRIVE AT 9:45 AM, PATIENT AWARE

## 2015-10-03 NOTE — Telephone Encounter (Signed)
PATIENT'S MRI WAS APPROVED AND SCHEDULED, AS WELL AS FOLLOW UP WITH DR. HARRISON. PATIENT STATES SHE IS DOING HOME EXERCISES FOR HER SHOULDER, USING A SHEET FROM PREVIOUS ROTATOR CUFF SURGERY, AND STATES THAT IS HELPING TO STRENGTHEN THE SHOULDER. IBUPROFEN WAS SENT INTO PHARMACY, AND PATIENT WAS ADVISED SHE COULD PICK THAT UP TODAY, AND PICK HER PAIN MEDICINE UP TOMORROW AFTER DR. HARRISON SIGNS IT. SHE ALSO WAS ADVISED OF THE WEANING DOWN FROM PERCOCET TO HYDROCODONE.

## 2015-10-12 ENCOUNTER — Ambulatory Visit (HOSPITAL_COMMUNITY): Payer: BLUE CROSS/BLUE SHIELD

## 2015-10-17 ENCOUNTER — Ambulatory Visit: Payer: BLUE CROSS/BLUE SHIELD | Admitting: Orthopedic Surgery

## 2015-10-18 ENCOUNTER — Ambulatory Visit (HOSPITAL_COMMUNITY): Admission: RE | Admit: 2015-10-18 | Payer: BLUE CROSS/BLUE SHIELD | Source: Ambulatory Visit

## 2015-10-20 ENCOUNTER — Ambulatory Visit: Payer: BLUE CROSS/BLUE SHIELD | Admitting: Orthopedic Surgery

## 2015-11-10 ENCOUNTER — Emergency Department (HOSPITAL_COMMUNITY)
Admission: EM | Admit: 2015-11-10 | Discharge: 2015-11-10 | Disposition: A | Discharge status: Home / Self Care | Payer: BLUE CROSS/BLUE SHIELD | Attending: Emergency Medicine | Admitting: Emergency Medicine

## 2015-11-10 ENCOUNTER — Encounter (HOSPITAL_COMMUNITY): Payer: Self-pay

## 2015-11-10 DIAGNOSIS — Y92002 Bathroom of unspecified non-institutional (private) residence single-family (private) house as the place of occurrence of the external cause: Secondary | ICD-10-CM | POA: Diagnosis not present

## 2015-11-10 DIAGNOSIS — S161XXA Strain of muscle, fascia and tendon at neck level, initial encounter: Secondary | ICD-10-CM | POA: Diagnosis not present

## 2015-11-10 DIAGNOSIS — X58XXXA Exposure to other specified factors, initial encounter: Secondary | ICD-10-CM | POA: Diagnosis not present

## 2015-11-10 DIAGNOSIS — Z79899 Other long term (current) drug therapy: Secondary | ICD-10-CM | POA: Diagnosis not present

## 2015-11-10 DIAGNOSIS — S46812A Strain of other muscles, fascia and tendons at shoulder and upper arm level, left arm, initial encounter: Secondary | ICD-10-CM

## 2015-11-10 DIAGNOSIS — F17210 Nicotine dependence, cigarettes, uncomplicated: Secondary | ICD-10-CM | POA: Diagnosis not present

## 2015-11-10 DIAGNOSIS — Y93E1 Activity, personal bathing and showering: Secondary | ICD-10-CM | POA: Insufficient documentation

## 2015-11-10 DIAGNOSIS — Y999 Unspecified external cause status: Secondary | ICD-10-CM | POA: Diagnosis not present

## 2015-11-10 DIAGNOSIS — R202 Paresthesia of skin: Secondary | ICD-10-CM | POA: Diagnosis present

## 2015-11-10 MED ORDER — DEXAMETHASONE 4 MG PO TABS: 4.0000 mg | ORAL_TABLET | Freq: Two times a day (BID) | ORAL | Status: DC

## 2015-11-10 MED ORDER — CYCLOBENZAPRINE HCL 10 MG PO TABS: 10.0000 mg | ORAL_TABLET | Freq: Three times a day (TID) | ORAL | Status: DC

## 2015-11-10 NOTE — ED Notes (Signed)
Patient states she stepped out of shower and felt a tingle in neck. Now having spasms in neck/upper back.

## 2015-11-10 NOTE — Discharge Instructions (Signed)
Muscle Strain Please apply heat to your left neck and shoulder. Please rest your neck and shoulder is much as possible. Flexeril may cause drowsiness, please use with caution. Please take Decadron with food. A muscle strain (pulled muscle) happens when a muscle is stretched beyond normal length. It happens when a sudden, violent force stretches your muscle too far. Usually, a few of the fibers in your muscle are torn. Muscle strain is common in athletes. Recovery usually takes 1-2 weeks. Complete healing takes 5-6 weeks.  HOME CARE   Follow the PRICE method of treatment to help your injury get better. Do this the first 2-3 days after the injury:  Protect. Protect the muscle to keep it from getting injured again.  Rest. Limit your activity and rest the injured body part.  Ice. Put ice in a plastic bag. Place a towel between your skin and the bag. Then, apply the ice and leave it on from 15-20 minutes each hour. After the third day, switch to moist heat packs.  Compression. Use a splint or elastic bandage on the injured area for comfort. Do not put it on too tightly.  Elevate. Keep the injured body part above the level of your heart.  Only take medicine as told by your doctor.  Warm up before doing exercise to prevent future muscle strains. GET HELP IF:   You have more pain or puffiness (swelling) in the injured area.  You feel numbness, tingling, or notice a loss of strength in the injured area. MAKE SURE YOU:   Understand these instructions.  Will watch your condition.  Will get help right away if you are not doing well or get worse.   This information is not intended to replace advice given to you by your health care provider. Make sure you discuss any questions you have with your health care provider.   Document Released: 04/17/2008 Document Revised: 04/29/2013 Document Reviewed: 02/05/2013 Elsevier Interactive Patient Education [employer].

## 2015-11-10 NOTE — ED Provider Notes (Signed)
CSN: [id number]     Arrival date & time 11/10/15  1912 History   None    Chief Complaint  Patient presents with  . Spasms     (Consider location/radiation/quality/duration/timing/severity/associated sxs/prior Treatment) HPI Comments: Patient is a 45 year old female who presents to the emergency department with a complaint of spasm and tingling in the neck and shoulders.  The patient states that she was stepping out of the shower, and felt a pull, followed by tingle in her neck and shoulders. She did not lose any control of her upper extremities. There was no fall. She did not lose any control of her lower extremities. It is of note that she has had on previous back surgeries and neck surgeries. His been no recent high fevers reported. The patient presents for evaluation and assistance with her discomfort.  The history is provided by the patient.    Past Medical History  Diagnosis Date  . HBP (high blood pressure)   . DDD (degenerative disc disease)   . Ulcer disease   . Hernia   . Chronic pain   . Anxiety    Past Surgical History  Procedure Laterality Date  . Mastectomy, partial      x 2  . Cholecystectomy    . Appendectomy    . Carpal tunnel release      bilateral  . Back surgery      x 4  . Rotator cuff repair      right  . Tubal ligation    . Mouth surgery    . Esophagogastroduodenoscopy  03/06/2010    [id number] quadrant distal esophageal/patent tubular esophagus/small HH, antral erosions and ulcerations. Bx negative for persistent H.pylori  . Colonoscopy  10/20/2009    [id number] anal canal/left-side diverticula and multiple polyps. poor prep compromised exam. Next TCS 09/2014.  Marland Kitchen Esophagogastroduodenoscopy  10/20/2009    [id number] HH/prepyloric antral ulcer s/p bx (+h.pylori), ERE  . Intrauterine device insertion     Family History  Problem Relation Age of Onset  . Heart disease    . Arthritis    . Cancer    . Asthma    . Diabetes     Social History   Substance Use Topics  . Smoking status: Current Every Day Smoker -- 1.00 packs/day    Types: Cigarettes  . Smokeless tobacco: None  . Alcohol Use: No   OB History    No data available     Review of Systems  Musculoskeletal: Positive for back pain and arthralgias.  Psychiatric/Behavioral: The patient is nervous/anxious.   All other systems reviewed and are negative.     Allergies  Amoxicillin and Aspirin  Home Medications   Prior to Admission medications   Medication Sig Start Date End Date Taking? Authorizing Provider  ALPRAZolam Duanne Moron) 1 MG tablet Take 1 mg by mouth 3 (three) times daily as needed for sleep or anxiety.     Historical Provider, MD  HYDROcodone-acetaminophen (NORCO) 7.5-325 MG tablet Take 1 tablet by mouth every 6 (six) hours as needed for moderate pain. 10/03/15   Carole Civil, MD  ibuprofen (ADVIL,MOTRIN) 800 MG tablet Take 1 tablet (800 mg total) by mouth 3 (three) times daily. 10/03/15   Carole Civil, MD  oxyCODONE-acetaminophen (PERCOCET/ROXICET) 5-325 MG tablet Take 1 tablet by mouth every 6 (six) hours as needed for severe pain. 09/19/15   Carole Civil, MD   BP 136/82 mmHg  Pulse 100  Temp(Src) 98.9 F (37.2 C) (Oral)  Resp 15  Ht 5\' 3"  (1.6 m)  Wt 96.163 kg  BMI 37.56 kg/m2 Physical Exam  Constitutional: She is oriented to person, place, and time. She appears well-developed and well-nourished.  Non-toxic appearance.  HENT:  Head: Normocephalic.  Right Ear: Tympanic membrane and external ear normal.  Left Ear: Tympanic membrane and external ear normal.  Eyes: EOM and lids are normal. Pupils are equal, round, and reactive to light.  Neck: Normal range of motion. Neck supple. Carotid bruit is not present.  Cardiovascular: Normal rate, regular rhythm, normal heart sounds, intact distal pulses and normal pulses.   Pulmonary/Chest: Breath sounds normal. No respiratory distress.  Abdominal: Soft. Bowel sounds are normal. There is no  tenderness. There is no guarding.  Musculoskeletal: Normal range of motion.  There is palpable spasm of the upper and lower trapezius on the left. There is full range of motion of the right and left shoulder. There is good range of motion of the cervical spine, this is limited because of previous surgeries.  Lymphadenopathy:       Head (right side): No submandibular adenopathy present.       Head (left side): No submandibular adenopathy present.    She has no cervical adenopathy.  Neurological: She is alert and oriented to person, place, and time. She has normal strength. No cranial nerve deficit or sensory deficit.  Skin: Skin is warm and dry.  Psychiatric: She has a normal mood and affect. Her speech is normal.  Nursing note and vitals reviewed.   ED Course  Procedures (including critical care time) Labs Review Labs Reviewed - No data to display  Imaging Review No results found. I have personally reviewed and evaluated these images and lab results as part of my medical decision-making.   EKG Interpretation None      MDM  Vital signs reviewed. The examination favors trapezius strain. There is no neurovascular compromise of the upper extremities.  The patient will be treated with Flexeril and Decadron. The patient is to follow with her primary physician if not improving.    Final diagnoses:  None    *I have reviewed nursing notes, vital signs, and all appropriate lab and imaging results for this patient.**    Lily Kocher, PA-C 11/10/15 2008  Tanna Furry, MD 11/21/15 1224

## 2016-04-17 ENCOUNTER — Encounter (HOSPITAL_COMMUNITY): Payer: BLUE CROSS/BLUE SHIELD

## 2016-12-22 ENCOUNTER — Encounter (HOSPITAL_COMMUNITY): Payer: Self-pay | Admitting: Emergency Medicine

## 2016-12-22 ENCOUNTER — Emergency Department (HOSPITAL_COMMUNITY)
Admission: EM | Admit: 2016-12-22 | Discharge: 2016-12-22 | Disposition: A | Discharge status: Home Health | Payer: BLUE CROSS/BLUE SHIELD | Attending: Emergency Medicine | Admitting: Emergency Medicine

## 2016-12-22 DIAGNOSIS — H1031 Unspecified acute conjunctivitis, right eye: Secondary | ICD-10-CM | POA: Insufficient documentation

## 2016-12-22 DIAGNOSIS — F17210 Nicotine dependence, cigarettes, uncomplicated: Secondary | ICD-10-CM | POA: Insufficient documentation

## 2016-12-22 DIAGNOSIS — H109 Unspecified conjunctivitis: Secondary | ICD-10-CM

## 2016-12-22 MED ORDER — TOBRAMYCIN 0.3 % OP SOLN
OPHTHALMIC | Status: AC
  Filled 2016-12-22: qty 5

## 2016-12-22 MED ORDER — TOBRAMYCIN 0.3 % OP SOLN
2.0000 [drp] | Freq: Once | OPHTHALMIC | Status: AC
  Administered 2016-12-22: 2 [drp] via OPHTHALMIC

## 2016-12-22 NOTE — Discharge Instructions (Signed)
Your examination is consistent with conjunctivitis or pinkeye. This is highly contagious. Please use a cool compress to your right eye 3 or 4 times daily. Please wash down surfaces such as doorknobs and countertops to prevent spread. Please keep your distance from others. Use 2 drops of tobramycin to the right eye every 4 hours for the next 4 or 5 days. Please see Dr.Golding for additional evaluation and management if not improving.

## 2016-12-22 NOTE — ED Provider Notes (Signed)
[facility] DEPT Provider Note   CSN: [id number] Arrival date & time: 12/22/16  1053     History   Chief Complaint Chief Complaint  Patient presents with  . Eye Drainage    HPI Brenda Taylor is a 46 y.o. female.  The history is provided by the patient.  Eye Problem   This is a new problem. The current episode started yesterday. The problem occurs constantly. The problem has been gradually worsening. There is a problem in the right eye. There was no injury mechanism. The pain is moderate. There is known exposure to pink eye. Associated symptoms include discharge, photophobia and eye redness. Pertinent negatives include no numbness, no double vision, no nausea, no vomiting and no weakness. She has tried nothing for the symptoms.    Past Medical History:  Diagnosis Date  . Anxiety   . Chronic pain   . DDD (degenerative disc disease)   . HBP (high blood pressure)   . Hernia   . Ulcer disease     Patient Active Problem List   Diagnosis Date Noted  . HNP (herniated nucleus pulposus), lumbar 07/03/2013  . Unspecified constipation 10/31/2012  . Rotator cuff syndrome of left shoulder 10/09/2011  . ANEMIA 10/24/2009  . HEMOCCULT POSITIVE STOOL 10/24/2009  . ABDOMINAL PAIN, GENERALIZED 10/24/2009  . SHOULDER PAIN, RIGHT 05/28/2007  . HIGH BLOOD PRESSURE 05/23/2007    Past Surgical History:  Procedure Laterality Date  . APPENDECTOMY    . BACK SURGERY     x 4  . CARPAL TUNNEL RELEASE     bilateral  . CHOLECYSTECTOMY    . COLONOSCOPY  10/20/2009   [id number] anal canal/left-side diverticula and multiple polyps. poor prep compromised exam. Next TCS 09/2014.  Marland Kitchen ESOPHAGOGASTRODUODENOSCOPY  03/06/2010   [id number]:YTKZ quadrant distal esophageal/patent tubular esophagus/small HH, antral erosions and ulcerations. Bx negative for persistent H.pylori  . ESOPHAGOGASTRODUODENOSCOPY  10/20/2009   SWF:[id number] HH/prepyloric antral ulcer s/p bx (+h.pylori), ERE  . INTRAUTERINE DEVICE INSERTION     . MASTECTOMY, PARTIAL     x 2  . MOUTH SURGERY    . ROTATOR CUFF REPAIR     right  . TUBAL LIGATION      OB History    No data available       Home Medications    Prior to Admission medications   Medication Sig Start Date End Date Taking? Authorizing Provider  ALPRAZolam Duanne Moron) 1 MG tablet Take 1 mg by mouth 3 (three) times daily as needed for sleep or anxiety.     [provider]  Aspirin-Acetaminophen (GOODY BODY PAIN) 500-325 MG PACK Take 1-2 packets by mouth once as needed (for pain).    [provider]  cyclobenzaprine (FLEXERIL) 10 MG tablet Take 1 tablet (10 mg total) by mouth 3 (three) times daily. 11/10/15   Lily Kocher, PA-C  dexamethasone (DECADRON) 4 MG tablet Take 1 tablet (4 mg total) by mouth 2 (two) times daily with a meal. 11/10/15   Lily Kocher, PA-C  HYDROcodone-acetaminophen (NORCO) 7.5-325 MG tablet Take 1 tablet by mouth every 6 (six) hours as needed for moderate pain. Patient not taking: Reported on 11/10/2015 10/03/15   Carole Civil, MD  ibuprofen (ADVIL,MOTRIN) 800 MG tablet Take 1 tablet (800 mg total) by mouth 3 (three) times daily. Patient not taking: Reported on 11/10/2015 10/03/15   Carole Civil, MD  oxyCODONE-acetaminophen (PERCOCET/ROXICET) 5-325 MG tablet Take 1 tablet by mouth every 6 (six) hours as needed for severe pain. Patient  not taking: Reported on 11/10/2015 09/19/15   Carole Civil, MD    Family History Family History  Problem Relation Age of Onset  . Heart disease Unknown   . Arthritis Unknown   . Cancer Unknown   . Asthma Unknown   . Diabetes Unknown     Social History Social History  Substance Use Topics  . Smoking status: Current Every Day Smoker    Packs/day: 1.00    Types: Cigarettes  . Smokeless tobacco: Not on file  . Alcohol use No     Allergies   Amoxicillin and Aspirin   Review of Systems Review of Systems  Constitutional: Negative for activity change and appetite  change.  HENT: Positive for congestion. Negative for ear discharge, ear pain, facial swelling, nosebleeds, rhinorrhea, sneezing and tinnitus.   Eyes: Positive for photophobia, discharge and redness. Negative for double vision and pain.  Respiratory: Negative for cough, choking, shortness of breath and wheezing.   Cardiovascular: Negative for chest pain, palpitations and leg swelling.  Gastrointestinal: Negative for abdominal pain, blood in stool, constipation, diarrhea, nausea and vomiting.  Genitourinary: Negative for difficulty urinating, dysuria, flank pain, frequency and hematuria.  Musculoskeletal: Negative for back pain, gait problem, myalgias and neck pain.  Skin: Negative for color change, rash and wound.  Neurological: Negative for dizziness, seizures, syncope, facial asymmetry, speech difficulty, weakness and numbness.  Hematological: Negative for adenopathy. Does not bruise/bleed easily.  Psychiatric/Behavioral: Negative for agitation, confusion, hallucinations, self-injury and suicidal ideas. The patient is not nervous/anxious.      Physical Exam Updated Vital Signs BP (!) 149/91 (BP Location: Left Arm)   Pulse 81   Temp 97.7 F (36.5 C) (Oral)   Resp 18   Ht 5\' 6"  (1.676 m)   Wt 85 kg (187 lb 4.8 oz)   SpO2 96%   BMI 30.23 kg/m   Physical Exam  Constitutional: She appears well-developed and well-nourished. No distress.  HENT:  Head: Normocephalic and atraumatic.  Right Ear: External ear normal.  Left Ear: External ear normal.  Eyes: EOM are normal. Pupils are equal, round, and reactive to light. Right eye exhibits discharge. Left eye exhibits no discharge. Right conjunctiva is injected. Left conjunctiva is not injected. No scleral icterus.  Fundoscopic exam:      The right eye shows no AV nicking, no hemorrhage and no papilledema.       The left eye shows no AV nicking, no hemorrhage and no papilledema.  Neck: Neck supple. No tracheal deviation present.    Cardiovascular: Normal rate, regular rhythm and intact distal pulses.   Pulmonary/Chest: Effort normal and breath sounds normal. No stridor. No respiratory distress. She has no wheezes. She has no rales.  Abdominal: Soft. Bowel sounds are normal. She exhibits no distension. There is no tenderness. There is no rebound and no guarding.  Musculoskeletal: She exhibits no edema or tenderness.  Neurological: She is alert. She has normal strength. No cranial nerve deficit (no facial droop, extraocular movements intact, no slurred speech) or sensory deficit. She exhibits normal muscle tone. She displays no seizure activity. Coordination normal.  Skin: Skin is warm and dry. No rash noted.  Psychiatric: She has a normal mood and affect.  Nursing note and vitals reviewed.    ED Treatments / Results  Labs (all labs ordered are listed, but only abnormal results are displayed) Labs Reviewed - No data to display  EKG  EKG Interpretation None       Radiology No results found.  Procedures Procedures (including critical care time)  Medications Ordered in ED Medications - No data to display   Initial Impression / Assessment and Plan / ED Course  I have reviewed the triage vital signs and the nursing notes.  Pertinent labs & imaging results that were available during my care of the patient were reviewed by me and considered in my medical decision making (see chart for details).       Final Clinical Impressions(s) / ED Diagnoses MDM Vital signs within normal limits. The examination is consistent with conjunctivitis. The patient will be treated with cool compresses to the right eye. The patient will be treated with tobramycin. Work note has been given. We discussed the importance of good handwashing and keeping distance from others. Patient is in agreement with this plan.    Final diagnoses:  None    New Prescriptions New Prescriptions   No medications on file     Lily Kocher,  Hershal Coria 12/22/16 1319    Milton Ferguson, MD 12/23/16 [phone]

## 2016-12-22 NOTE — ED Triage Notes (Signed)
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

## 2017-04-01 ENCOUNTER — Emergency Department (HOSPITAL_COMMUNITY): Payer: BLUE CROSS/BLUE SHIELD

## 2017-04-01 ENCOUNTER — Emergency Department (HOSPITAL_COMMUNITY)
Admission: EM | Admit: 2017-04-01 | Discharge: 2017-04-01 | Disposition: A | Discharge status: Home / Self Care | Payer: BLUE CROSS/BLUE SHIELD | Attending: Emergency Medicine | Admitting: Emergency Medicine

## 2017-04-01 ENCOUNTER — Encounter (HOSPITAL_COMMUNITY): Payer: Self-pay | Admitting: Emergency Medicine

## 2017-04-01 DIAGNOSIS — F17210 Nicotine dependence, cigarettes, uncomplicated: Secondary | ICD-10-CM | POA: Insufficient documentation

## 2017-04-01 DIAGNOSIS — Y998 Other external cause status: Secondary | ICD-10-CM | POA: Insufficient documentation

## 2017-04-01 DIAGNOSIS — I10 Essential (primary) hypertension: Secondary | ICD-10-CM | POA: Insufficient documentation

## 2017-04-01 DIAGNOSIS — M2550 Pain in unspecified joint: Secondary | ICD-10-CM | POA: Insufficient documentation

## 2017-04-01 DIAGNOSIS — W010XXA Fall on same level from slipping, tripping and stumbling without subsequent striking against object, initial encounter: Secondary | ICD-10-CM | POA: Insufficient documentation

## 2017-04-01 DIAGNOSIS — Y929 Unspecified place or not applicable: Secondary | ICD-10-CM | POA: Insufficient documentation

## 2017-04-01 DIAGNOSIS — S46912A Strain of unspecified muscle, fascia and tendon at shoulder and upper arm level, left arm, initial encounter: Secondary | ICD-10-CM

## 2017-04-01 DIAGNOSIS — Y9389 Activity, other specified: Secondary | ICD-10-CM | POA: Insufficient documentation

## 2017-04-01 DIAGNOSIS — S63501A Unspecified sprain of right wrist, initial encounter: Secondary | ICD-10-CM

## 2017-04-01 NOTE — Discharge Instructions (Signed)
Your x-rays are negative for fracture or dislocation. Please see Dr. Aline Brochure for orthopedic evaluation of your shoulder, especially since you have had some rotator cuff injuries in the past. Please apply an arthritic rub to your shoulder 3 times daily. Please use Tylenol Extra Strength for discomfort.

## 2017-04-01 NOTE — ED Provider Notes (Signed)
[facility] DEPT Provider Note   CSN: [id number] Arrival date & time: 04/01/17  1906     History   Chief Complaint Chief Complaint  Patient presents with  . Shoulder Pain    HPI SOO STEELMAN is a 46 y.o. female.  Patient is a 46 year old female who presents to the emergency department with a complaint of right wrist and left forearm area pain.  The patient states that she slipped on a wet C misstep on last night. In the course of trying to break her fall she injured her wrist and her shoulder. Today while at work she was trying to lift a heavy pain in, and she'll most dropped because of the severity of the pain in her left shoulder. She presents now for evaluation of this.  Patient has a history of degenerative disc disease, osteoarthritis of multiple sites.      Past Medical History:  Diagnosis Date  . Anxiety   . Chronic pain   . DDD (degenerative disc disease)   . HBP (high blood pressure)   . Hernia   . Ulcer disease     Patient Active Problem List   Diagnosis Date Noted  . HNP (herniated nucleus pulposus), lumbar 07/03/2013  . Unspecified constipation 10/31/2012  . Rotator cuff syndrome of left shoulder 10/09/2011  . ANEMIA 10/24/2009  . HEMOCCULT POSITIVE STOOL 10/24/2009  . ABDOMINAL PAIN, GENERALIZED 10/24/2009  . SHOULDER PAIN, RIGHT 05/28/2007  . HIGH BLOOD PRESSURE 05/23/2007    Past Surgical History:  Procedure Laterality Date  . APPENDECTOMY    . BACK SURGERY     x 4  . CARPAL TUNNEL RELEASE     bilateral  . CHOLECYSTECTOMY    . COLONOSCOPY  10/20/2009   [id number] anal canal/left-side diverticula and multiple polyps. poor prep compromised exam. Next TCS 09/2014.  Marland Kitchen ESOPHAGOGASTRODUODENOSCOPY  03/06/2010   QIH:KVQQ quadrant distal esophageal/patent tubular esophagus/small HH, antral erosions and ulcerations. Bx negative for persistent H.pylori  . ESOPHAGOGASTRODUODENOSCOPY  10/20/2009   VZD:GLOVF HH/prepyloric antral ulcer s/p bx  (+h.pylori), ERE  . INTRAUTERINE DEVICE INSERTION    . MASTECTOMY, PARTIAL     x 2  . MOUTH SURGERY    . ROTATOR CUFF REPAIR     right  . TUBAL LIGATION      OB History    No data available       Home Medications    Prior to Admission medications   Medication Sig Start Date End Date Taking? Authorizing Provider  ALPRAZolam Duanne Moron) 1 MG tablet Take 1 mg by mouth 3 (three) times daily as needed for sleep or anxiety.     [provider]  Aspirin-Acetaminophen (GOODY BODY PAIN) 500-325 MG PACK Take 1-2 packets by mouth once as needed (for pain).    [provider]  cyclobenzaprine (FLEXERIL) 10 MG tablet Take 1 tablet (10 mg total) by mouth 3 (three) times daily. 11/10/15   Lily Kocher, PA-C  dexamethasone (DECADRON) 4 MG tablet Take 1 tablet (4 mg total) by mouth 2 (two) times daily with a meal. 11/10/15   Lily Kocher, PA-C  HYDROcodone-acetaminophen (NORCO) 7.5-325 MG tablet Take 1 tablet by mouth every 6 (six) hours as needed for moderate pain. Patient not taking: Reported on 11/10/2015 10/03/15   Carole Civil, MD  ibuprofen (ADVIL,MOTRIN) 800 MG tablet Take 1 tablet (800 mg total) by mouth 3 (three) times daily. Patient not taking: Reported on 11/10/2015 10/03/15   Carole Civil, MD  oxyCODONE-acetaminophen (PERCOCET/ROXICET) [phone]  MG tablet Take 1 tablet by mouth every 6 (six) hours as needed for severe pain. Patient not taking: Reported on 11/10/2015 09/19/15   Carole Civil, MD    Family History Family History  Problem Relation Age of Onset  . Heart disease Unknown   . Arthritis Unknown   . Cancer Unknown   . Asthma Unknown   . Diabetes Unknown     Social History Social History  Substance Use Topics  . Smoking status: Current Every Day Smoker    Packs/day: 1.00    Types: Cigarettes  . Smokeless tobacco: Never Used  . Alcohol use No     Allergies   Amoxicillin and Aspirin   Review of Systems Review of Systems    Constitutional: Negative for activity change.       All ROS Neg except as noted in HPI  HENT: Negative for nosebleeds.   Eyes: Negative for photophobia and discharge.  Respiratory: Negative for cough, shortness of breath and wheezing.   Cardiovascular: Negative for chest pain and palpitations.  Gastrointestinal: Negative for abdominal pain and blood in stool.  Genitourinary: Negative for dysuria, frequency and hematuria.  Musculoskeletal: Positive for arthralgias. Negative for back pain and neck pain.  Skin: Negative.   Neurological: Negative for dizziness, seizures and speech difficulty.  Hematological: Bruises/bleeds easily.  Psychiatric/Behavioral: Negative for confusion and hallucinations.     Physical Exam Updated Vital Signs BP 131/84   Pulse (!) 101   Temp 98.2 F (36.8 C)   Resp 20   Ht 5\' 6"  (1.676 m)   Wt 83.8 kg (184 lb 11.2 oz)   SpO2 97%   BMI 29.81 kg/m   Physical Exam  Constitutional: She is oriented to person, place, and time. She appears well-developed and well-nourished.  Non-toxic appearance.  HENT:  Head: Normocephalic.  Right Ear: Tympanic membrane and external ear normal.  Left Ear: Tympanic membrane and external ear normal.  Eyes: Pupils are equal, round, and reactive to light. EOM and lids are normal.  Neck: Normal range of motion. Neck supple. Carotid bruit is not present.  Cardiovascular: Normal rate, regular rhythm, normal heart sounds, intact distal pulses and normal pulses.   Pulmonary/Chest: Breath sounds normal. No respiratory distress.  Abdominal: Soft. Bowel sounds are normal. There is no tenderness. There is no guarding.  Musculoskeletal: Normal range of motion.  There is good range of motion of the right shoulder, elbow, wrist, and fingers. There is a bruise on the dorsum of the right wrist. There is mild soreness present with range of motion.  There is pain with range of motion of the left shoulder. There is crepitus with range of  motion of the left shoulder. There is no evidence of deformity or dislocation. There is full range of motion of the left elbow, wrist, and fingers. Capillary refill is less than 2 seconds. Radial pulses are 2+ bilaterally.  Lymphadenopathy:       Head (right side): No submandibular adenopathy present.       Head (left side): No submandibular adenopathy present.    She has no cervical adenopathy.  Neurological: She is alert and oriented to person, place, and time. She has normal strength. No cranial nerve deficit or sensory deficit.  Skin: Skin is warm and dry.  Psychiatric: She has a normal mood and affect. Her speech is normal.  Nursing note and vitals reviewed.    ED Treatments / Results  Labs (all labs ordered are listed, but only abnormal results are  displayed) Labs Reviewed - No data to display  EKG  EKG Interpretation None       Radiology Dg Wrist Complete Right  Result Date: 04/01/2017 CLINICAL DATA:  Pain EXAM: RIGHT WRIST - COMPLETE 3+ VIEW COMPARISON:  None. FINDINGS: Mild degenerative changes are seen in the radial aspect of the carpus. No fracture. No subluxation or dislocation. No worrisome lytic or sclerotic osseous abnormality. IMPRESSION: Minimal degenerative changes in the radial aspect of the carpus. Electronically Signed   By: Misty Stanley M.D.   On: 04/01/2017 19:48   Dg Shoulder Left  Result Date: 04/01/2017 CLINICAL DATA:  Pain. EXAM: LEFT SHOULDER - 2+ VIEW COMPARISON:  None. FINDINGS: There is no evidence of fracture or dislocation. There is no evidence of arthropathy or other focal bone abnormality. Soft tissues are unremarkable. IMPRESSION: Negative. Electronically Signed   By: Misty Stanley M.D.   On: 04/01/2017 19:48    Procedures Procedures (including critical care time)  Medications Ordered in ED Medications - No data to display   Initial Impression / Assessment and Plan / ED Course  I have reviewed the triage vital signs and the nursing  notes.  Pertinent labs & imaging results that were available during my care of the patient were reviewed by me and considered in my medical decision making (see chart for details).       Final Clinical Impressions(s) / ED Diagnoses MDM Vital signs are within normal limits. X-ray of the left shoulder is negative for acute fracture or dislocation. X-ray of the wrist shows degenerative changes, but no fracture or dislocation.  I suspect the patient has a sprain of the wrist, and a strain/sprain of the left shoulder. We discussed the fact that she has had some rotator cuff problems in the past, and should see the orthopedic specialist for additional rotator cuff evaluation. The patient will use an arthritic rub, and she cannot take nonsteroidal anti-inflammatory medications. She will use Tylenol extra strength for her discomfort.    Final diagnoses:  Strain of left shoulder, initial encounter  Sprain of right wrist, initial encounter    New Prescriptions New Prescriptions   No medications on file     Annette Stable 04/01/17 2020    Milton Ferguson, MD 04/02/17 [phone]

## 2017-04-01 NOTE — ED Notes (Signed)
Pt ambulatory to waiting room. Pt verbalized understanding of discharge instructions.   

## 2017-04-01 NOTE — ED Notes (Signed)
Patient transported to X-ray 

## 2017-04-01 NOTE — ED Triage Notes (Signed)
Pt states she fell in rain last night injurying right wrist and left shoulder.

## 2017-08-11 ENCOUNTER — Other Ambulatory Visit: Payer: Self-pay

## 2017-08-11 ENCOUNTER — Emergency Department (HOSPITAL_COMMUNITY)
Admission: EM | Admit: 2017-08-11 | Discharge: 2017-08-12 | Disposition: A | Discharge status: Home / Self Care | Payer: BLUE CROSS/BLUE SHIELD | Attending: Emergency Medicine | Admitting: Emergency Medicine

## 2017-08-11 ENCOUNTER — Encounter (HOSPITAL_COMMUNITY): Payer: Self-pay | Admitting: Emergency Medicine

## 2017-08-11 DIAGNOSIS — Z9049 Acquired absence of other specified parts of digestive tract: Secondary | ICD-10-CM | POA: Diagnosis not present

## 2017-08-11 DIAGNOSIS — Z79899 Other long term (current) drug therapy: Secondary | ICD-10-CM | POA: Insufficient documentation

## 2017-08-11 DIAGNOSIS — J40 Bronchitis, not specified as acute or chronic: Secondary | ICD-10-CM

## 2017-08-11 DIAGNOSIS — H66003 Acute suppurative otitis media without spontaneous rupture of ear drum, bilateral: Secondary | ICD-10-CM | POA: Insufficient documentation

## 2017-08-11 DIAGNOSIS — F419 Anxiety disorder, unspecified: Secondary | ICD-10-CM | POA: Insufficient documentation

## 2017-08-11 DIAGNOSIS — F17210 Nicotine dependence, cigarettes, uncomplicated: Secondary | ICD-10-CM | POA: Insufficient documentation

## 2017-08-11 DIAGNOSIS — J029 Acute pharyngitis, unspecified: Secondary | ICD-10-CM

## 2017-08-11 DIAGNOSIS — I10 Essential (primary) hypertension: Secondary | ICD-10-CM | POA: Insufficient documentation

## 2017-08-11 MED ORDER — AZITHROMYCIN 250 MG PO TABS: ORAL_TABLET | ORAL | 0 refills | Status: DC

## 2017-08-11 MED ORDER — DOXYCYCLINE HYCLATE 100 MG PO CAPS: 100.0000 mg | ORAL_CAPSULE | Freq: Two times a day (BID) | ORAL | 0 refills | Status: DC

## 2017-08-11 MED ORDER — DOXYCYCLINE HYCLATE 100 MG PO TABS
100.0000 mg | ORAL_TABLET | Freq: Once | ORAL | Status: AC
  Administered 2017-08-11: 100 mg via ORAL
  Filled 2017-08-11: qty 1

## 2017-08-11 NOTE — ED Triage Notes (Signed)
Patient reports sore throat and earache since this am.

## 2017-08-11 NOTE — ED Notes (Signed)
Pt states sore throat, & earache. States she was around someone sick & works in [employer] the cold.

## 2017-08-11 NOTE — ED Provider Notes (Signed)
[facility] EMERGENCY DEPARTMENT Provider Note   CSN: [id number] Arrival date & time: 08/11/17  2256  Time seen 23:23 PM   History   Chief Complaint Chief Complaint  Patient presents with  . Sore Throat    HPI Brenda Taylor is a 47 y.o. female.  HPI patient reports she started feeling bad earlier this morning.  She states she was "chugged up" meaning she had congestion.  She states she has had a sore throat and bilateral ear pain but states her left hurts more than the right.  She has had a dry cough and this evening had a fever of 101.  She states she has been sweaty.  She denies runny nose, sneezing, nausea, vomiting, or diarrhea.  She complains of body ache in lots of loss of energy.  She has not had the flu shot states she is supposed to have the flu shot in the morning.  We discussed they would not give it to her as long she has a fever.  She states her daughter started getting ill on Friday and she has the flu, they live together.  PCP Sharilyn Sites, MD   Past Medical History:  Diagnosis Date  . Anxiety   . Chronic pain   . DDD (degenerative disc disease)   . HBP (high blood pressure)   . Hernia   . Ulcer disease     Patient Active Problem List   Diagnosis Date Noted  . HNP (herniated nucleus pulposus), lumbar 07/03/2013  . Unspecified constipation 10/31/2012  . Rotator cuff syndrome of left shoulder 10/09/2011  . ANEMIA 10/24/2009  . HEMOCCULT POSITIVE STOOL 10/24/2009  . ABDOMINAL PAIN, GENERALIZED 10/24/2009  . SHOULDER PAIN, RIGHT 05/28/2007  . HIGH BLOOD PRESSURE 05/23/2007    Past Surgical History:  Procedure Laterality Date  . APPENDECTOMY    . BACK SURGERY     x 4  . CARPAL TUNNEL RELEASE     bilateral  . CHOLECYSTECTOMY    . COLONOSCOPY  10/20/2009   SNK:NLZJQBH anal canal/left-side diverticula and multiple polyps. poor prep compromised exam. Next TCS 09/2014.  Marland Kitchen ESOPHAGOGASTRODUODENOSCOPY  03/06/2010   ALP:FXTK quadrant distal esophageal/patent  tubular esophagus/small HH, antral erosions and ulcerations. Bx negative for persistent H.pylori  . ESOPHAGOGASTRODUODENOSCOPY  10/20/2009   WIO:XBDZH HH/prepyloric antral ulcer s/p bx (+h.pylori), ERE  . INTRAUTERINE DEVICE INSERTION    . MASTECTOMY, PARTIAL     x 2  . MOUTH SURGERY    . ROTATOR CUFF REPAIR     right  . TUBAL LIGATION      OB History    Gravida Para Term Preterm AB Living   2 2 2          SAB TAB Ectopic Multiple Live Births                   Home Medications    Prior to Admission medications   Medication Sig Start Date End Date Taking? Authorizing Provider  ALPRAZolam Duanne Moron) 1 MG tablet Take 1 mg by mouth 3 (three) times daily as needed for sleep or anxiety.     [provider]  Aspirin-Acetaminophen (GOODY BODY PAIN) 500-325 MG PACK Take 1-2 packets by mouth once as needed (for pain).    [provider]  azithromycin (ZITHROMAX) 250 MG tablet Take 2 po the first day then once a day for the next 4 days. 08/11/17   Rolland Porter, MD  cyclobenzaprine (FLEXERIL) 10 MG tablet Take 1 tablet (10 mg  total) by mouth 3 (three) times daily. 11/10/15   Lily Kocher, PA-C  doxycycline (VIBRAMYCIN) 100 MG capsule Take 1 capsule (100 mg total) by mouth 2 (two) times daily. 08/11/17   Rolland Porter, MD    Family History Family History  Problem Relation Age of Onset  . Heart disease Unknown   . Arthritis Unknown   . Cancer Unknown   . Asthma Unknown   . Diabetes Unknown     Social History Social History   Tobacco Use  . Smoking status: Current Every Day Smoker    Packs/day: 1.00    Types: Cigarettes  . Smokeless tobacco: Never Used  Substance Use Topics  . Alcohol use: No  . Drug use: Yes    Frequency: 7.0 times per week    Types: Marijuana    Comment: last night  employed Smokes 1 ppd down from 2 ppd   Allergies   Amoxicillin and Aspirin   Review of Systems Review of Systems  All other systems reviewed and are negative.    Physical  Exam Updated Vital Signs BP (!) 141/76 (BP Location: Left Arm)   Pulse 94   Temp 98.5 F (36.9 C) (Oral)   Resp 16   Ht 5\' 9"  (1.753 m)   Wt 89.4 kg (197 lb)   LMP 08/11/2017   SpO2 98%   BMI 29.09 kg/m   Vital signs normal    Physical Exam  Constitutional: She is oriented to person, place, and time. She appears well-developed and well-nourished.  Non-toxic appearance. She does not appear ill. No distress.  HENT:  Head: Normocephalic and atraumatic.  Right Ear: External ear and ear canal normal.  Left Ear: External ear and ear canal normal.  Nose: Nose normal. No mucosal edema or rhinorrhea.  Mouth/Throat: Uvula is midline, oropharynx is clear and moist and mucous membranes are normal. No dental abscesses or uvula swelling. No oropharyngeal exudate, posterior oropharyngeal edema or posterior oropharyngeal erythema.  Patient has bilateral layering of purulent fluid behind both TMs with some mild dullness.  She denies having TM tubes in the past.  There is mild injection bilaterally.  Eyes: Conjunctivae and EOM are normal. Pupils are equal, round, and reactive to light.  Neck: Normal range of motion and full passive range of motion without pain. Neck supple.  Cardiovascular: Normal rate, regular rhythm and normal heart sounds. Exam reveals no gallop and no friction rub.  No murmur heard. Pulmonary/Chest: Effort normal and breath sounds normal. No respiratory distress. She has no wheezes. She has no rhonchi. She has no rales. She exhibits no tenderness and no crepitus.  Abdominal: Soft. Normal appearance and bowel sounds are normal. She exhibits no distension. There is no tenderness. There is no rebound and no guarding.  Musculoskeletal: Normal range of motion. She exhibits no edema or tenderness.  Moves all extremities well.   Neurological: She is alert and oriented to person, place, and time. She has normal strength. No cranial nerve deficit.  Skin: Skin is warm, dry and intact. No  rash noted. No erythema. No pallor.  Psychiatric: She has a normal mood and affect. Her speech is normal and behavior is normal. Her mood appears not anxious.  Nursing note and vitals reviewed.    ED Treatments / Results  Labs (all labs ordered are listed, but only abnormal results are displayed) Labs Reviewed - No data to display  EKG  EKG Interpretation None       Radiology No results found.  Procedures  Procedures (including critical care time)  Medications Ordered in ED Medications  doxycycline (VIBRA-TABS) tablet 100 mg (not administered)     Initial Impression / Assessment and Plan / ED Course  I have reviewed the triage vital signs and the nursing notes.  Pertinent labs & imaging results that were available during my care of the patient were reviewed by me and considered in my medical decision making (see chart for details).    Patient was started on doxycycline.  We did not do any further testing since based on her ear exam I was going to start her on antibiotics anyway.   Final Clinical Impressions(s) / ED Diagnoses   Final diagnoses:  Acute suppurative otitis media of both ears without spontaneous rupture of tympanic membranes, recurrence not specified  Bronchitis  Sore throat    ED Discharge Orders        Ordered    azithromycin (ZITHROMAX) 250 MG tablet     08/11/17 2336    doxycycline (VIBRAMYCIN) 100 MG capsule  2 times daily     08/11/17 2336    OTC ibuprofen and acetaminophen   Plan discharge  Rolland Porter, MD, Barbette Or, MD 08/11/17 [phone]

## 2017-08-11 NOTE — Discharge Instructions (Signed)
Drink plenty of fluids. Take mucinex DM OTC for cough, use cough drops for comfort. Take the antibiotics until gone. Take ibuprofen 600 mg + acetaminophen 1000 mg every 6 hrs for pain and fever as needed.  Recheck if you get worse such as shortness of breath, unable to swallow, have uncontrolled vomiting. Try to quit smoking. You should not work while you are having a fever.

## 2017-08-12 NOTE — ED Notes (Signed)
Pt alert & oriented x4, stable gait. Patient given discharge instructions, paperwork & prescription(s). Patient  instructed to stop at the registration desk to finish any additional paperwork. Patient verbalized understanding. Pt left department w/ no further questions. 

## 2017-09-08 ENCOUNTER — Other Ambulatory Visit: Payer: Self-pay

## 2017-09-08 ENCOUNTER — Encounter (HOSPITAL_COMMUNITY): Payer: Self-pay | Admitting: *Deleted

## 2017-09-08 ENCOUNTER — Emergency Department (HOSPITAL_COMMUNITY)
Admission: EM | Admit: 2017-09-08 | Discharge: 2017-09-09 | Disposition: A | Discharge status: Home / Self Care | Payer: BLUE CROSS/BLUE SHIELD | Attending: Emergency Medicine | Admitting: Emergency Medicine

## 2017-09-08 DIAGNOSIS — G8929 Other chronic pain: Secondary | ICD-10-CM | POA: Insufficient documentation

## 2017-09-08 DIAGNOSIS — Y33XXXA Other specified events, undetermined intent, initial encounter: Secondary | ICD-10-CM | POA: Diagnosis not present

## 2017-09-08 DIAGNOSIS — F17210 Nicotine dependence, cigarettes, uncomplicated: Secondary | ICD-10-CM | POA: Diagnosis not present

## 2017-09-08 DIAGNOSIS — M545 Low back pain: Secondary | ICD-10-CM | POA: Diagnosis not present

## 2017-09-08 DIAGNOSIS — M5442 Lumbago with sciatica, left side: Secondary | ICD-10-CM | POA: Diagnosis not present

## 2017-09-08 MED ORDER — CYCLOBENZAPRINE HCL 10 MG PO TABS
10.0000 mg | ORAL_TABLET | Freq: Once | ORAL | Status: AC
  Administered 2017-09-09: 10 mg via ORAL
  Filled 2017-09-08: qty 1

## 2017-09-08 MED ORDER — KETOROLAC TROMETHAMINE 60 MG/2ML IM SOLN
60.0000 mg | Freq: Once | INTRAMUSCULAR | Status: AC
  Administered 2017-09-09: 60 mg via INTRAMUSCULAR
  Filled 2017-09-08: qty 2

## 2017-09-08 MED ORDER — OXYCODONE-ACETAMINOPHEN 5-325 MG PO TABS
1.0000 | ORAL_TABLET | Freq: Once | ORAL | Status: AC
  Administered 2017-09-09: 1 via ORAL
  Filled 2017-09-08: qty 1

## 2017-09-08 NOTE — ED Provider Notes (Signed)
[facility] EMERGENCY DEPARTMENT Provider Note   CSN: [id number] Arrival date & time: 09/08/17  1946     History   Chief Complaint Chief Complaint  Patient presents with  . Fall  . Back Pain    HPI Brenda Taylor is a 47 y.o. female.  HPI   Brenda Taylor is a 47 y.o. female who presents to the Emergency Department complaining of left low back pain for 1 day.  She states her pain began when she "twisted wrong" in the shower.  She describes a sudden, sharp pain from her left low back that radiates into her buttock and down the back side of her leg.  Her pain is associated with intermittent numbness and tingling of her left leg.  She denies fall.  She has tried over-the-counter pain relievers without improvement.  She reports having multiple surgeries in the past to her lower back and having intermittent spasms to her back.  She denies abdominal pain, weakness of the lower extremities, urine or bowel incontinence or retention, fever, or chills.    Past Medical History:  Diagnosis Date  . Anxiety   . Chronic pain   . DDD (degenerative disc disease)   . HBP (high blood pressure)   . Hernia   . Ulcer disease     Patient Active Problem List   Diagnosis Date Noted  . HNP (herniated nucleus pulposus), lumbar 07/03/2013  . Unspecified constipation 10/31/2012  . Rotator cuff syndrome of left shoulder 10/09/2011  . ANEMIA 10/24/2009  . HEMOCCULT POSITIVE STOOL 10/24/2009  . ABDOMINAL PAIN, GENERALIZED 10/24/2009  . SHOULDER PAIN, RIGHT 05/28/2007  . HIGH BLOOD PRESSURE 05/23/2007    Past Surgical History:  Procedure Laterality Date  . APPENDECTOMY    . BACK SURGERY     x 4  . CARPAL TUNNEL RELEASE     bilateral  . CHOLECYSTECTOMY    . COLONOSCOPY  10/20/2009   EZM:OQHUTML anal canal/left-side diverticula and multiple polyps. poor prep compromised exam. Next TCS 09/2014.  Marland Kitchen ESOPHAGOGASTRODUODENOSCOPY  03/06/2010   YYT:KPTW quadrant distal esophageal/patent tubular  esophagus/small HH, antral erosions and ulcerations. Bx negative for persistent H.pylori  . ESOPHAGOGASTRODUODENOSCOPY  10/20/2009   SFK:CLEXN HH/prepyloric antral ulcer s/p bx (+h.pylori), ERE  . INTRAUTERINE DEVICE INSERTION    . MASTECTOMY, PARTIAL     x 2  . MOUTH SURGERY    . ROTATOR CUFF REPAIR     right  . TUBAL LIGATION      OB History    Gravida Para Term Preterm AB Living   2 2 2          SAB TAB Ectopic Multiple Live Births                   Home Medications    Prior to Admission medications   Medication Sig Start Date End Date Taking? Authorizing Provider  Aspirin-Acetaminophen (GOODY BODY PAIN) 500-325 MG PACK Take 1-2 packets by mouth once as needed (for pain).   Yes [provider]  azithromycin (ZITHROMAX) 250 MG tablet Take 2 po the first day then once a day for the next 4 days. Patient not taking: Reported on 09/08/2017 08/11/17   Rolland Porter, MD  doxycycline (VIBRAMYCIN) 100 MG capsule Take 1 capsule (100 mg total) by mouth 2 (two) times daily. Patient not taking: Reported on 09/08/2017 08/11/17   Rolland Porter, MD    Family History Family History  Problem Relation Age of Onset  . Heart disease Unknown   .  Arthritis Unknown   . Cancer Unknown   . Asthma Unknown   . Diabetes Unknown     Social History Social History   Tobacco Use  . Smoking status: Current Every Day Smoker    Packs/day: 1.00    Types: Cigarettes  . Smokeless tobacco: Never Used  Substance Use Topics  . Alcohol use: No  . Drug use: Yes    Frequency: 7.0 times per week    Types: Marijuana    Comment: Last use 09/07/17     Allergies   Amoxicillin and Aspirin   Review of Systems Review of Systems  Constitutional: Negative for fever.  Respiratory: Negative for shortness of breath.   Gastrointestinal: Negative for abdominal pain, constipation and vomiting.  Genitourinary: Negative for decreased urine volume, difficulty urinating, dysuria, flank pain and hematuria.    Musculoskeletal: Positive for back pain. Negative for joint swelling.  Skin: Negative for rash.  Neurological: Negative for weakness and numbness.  All other systems reviewed and are negative.    Physical Exam Updated Vital Signs BP (!) 143/81 (BP Location: Right Arm)   Pulse 91   Temp 98.6 F (37 C) (Oral)   Resp 17   Ht 5\' 8"  (1.727 m)   Wt 86.6 kg (191 lb)   LMP 09/05/2017   SpO2 99%   BMI 29.04 kg/m   Physical Exam  Constitutional: She is oriented to person, place, and time. She appears well-developed and well-nourished. No distress.  HENT:  Head: Normocephalic and atraumatic.  Neck: Normal range of motion. Neck supple.  Cardiovascular: Normal rate, regular rhythm and intact distal pulses.  DP pulses are strong and palpable bilaterally  Pulmonary/Chest: Effort normal and breath sounds normal. No respiratory distress.  Abdominal: Soft. She exhibits no distension. There is no tenderness.  Musculoskeletal: She exhibits tenderness. She exhibits no edema.       Lumbar back: She exhibits tenderness and pain. She exhibits normal range of motion, no swelling, no deformity, no laceration and normal pulse.  ttp of the lower lumbar spine and left paraspinal muscles.  Pt has 5/5 strength against resistance of bilateral lower extremities.     Neurological: She is alert and oriented to person, place, and time. She has normal strength. No sensory deficit. She exhibits normal muscle tone. Coordination and gait normal.  Reflex Scores:      Patellar reflexes are 2+ on the right side and 2+ on the left side.      Achilles reflexes are 2+ on the right side and 2+ on the left side. Skin: Skin is warm and dry. Capillary refill takes less than 2 seconds. No rash noted.  Nursing note and vitals reviewed.    ED Treatments / Results  Labs (all labs ordered are listed, but only abnormal results are displayed) Labs Reviewed - No data to display  EKG  EKG Interpretation None        Radiology No results found.  Procedures Procedures (including critical care time)  Medications Ordered in ED Medications - No data to display   Initial Impression / Assessment and Plan / ED Course  I have reviewed the triage vital signs and the nursing notes.  Pertinent labs & imaging results that were available during my care of the patient were reviewed by me and considered in my medical decision making (see chart for details).     Pt reviewed on the narcotic database.  No prescriptions on file since 2017  Pt denies fall.  NV intact.  No focal neuro deficits, ambulates with a slow but steady gait.  No concerning symptoms for cauda equina or infectious process.  Pt reports feeling better after medications and states she is ready for d/c home.  Agrees to tx plan and close PCP f/u.  Return precautions discussed.   Final Clinical Impressions(s) / ED Diagnoses   Final diagnoses:  Acute left-sided low back pain with left-sided sciatica    ED Discharge Orders    None       Kem Parkinson, PA-C 09/09/17 0100    Rolland Porter, MD 09/09/17 [phone]

## 2017-09-08 NOTE — ED Triage Notes (Signed)
Pt states she slipped in the shower yesterday. Pt has 5 back surgeries. She has intermittent tingling to LT upper and lower extremities and constant pain since fall.

## 2017-09-09 MED ORDER — CYCLOBENZAPRINE HCL 10 MG PO TABS: 10.0000 mg | ORAL_TABLET | Freq: Three times a day (TID) | ORAL | 0 refills | Status: DC | PRN

## 2017-09-09 MED ORDER — TRAMADOL HCL 50 MG PO TABS: 50.0000 mg | ORAL_TABLET | Freq: Four times a day (QID) | ORAL | 0 refills | Status: DC | PRN

## 2017-09-09 NOTE — Discharge Instructions (Signed)
apply  ice packs on and off to your back.  You may apply heat after 2-3 days.  Follow-up with your primary doctor or return here for any worsening symptoms.

## 2017-09-16 ENCOUNTER — Ambulatory Visit (HOSPITAL_COMMUNITY)
Admission: RE | Admit: 2017-09-16 | Discharge: 2017-09-16 | Disposition: A | Discharge status: Home / Self Care | Payer: BLUE CROSS/BLUE SHIELD | Source: Ambulatory Visit | Attending: Family Medicine | Admitting: Family Medicine

## 2017-09-16 ENCOUNTER — Other Ambulatory Visit (HOSPITAL_COMMUNITY): Payer: Self-pay | Admitting: Family Medicine

## 2017-09-16 DIAGNOSIS — I700 Atherosclerosis of aorta: Secondary | ICD-10-CM | POA: Insufficient documentation

## 2017-09-16 DIAGNOSIS — M48061 Spinal stenosis, lumbar region without neurogenic claudication: Secondary | ICD-10-CM | POA: Diagnosis not present

## 2017-09-16 DIAGNOSIS — M5442 Lumbago with sciatica, left side: Secondary | ICD-10-CM

## 2017-09-16 DIAGNOSIS — R202 Paresthesia of skin: Secondary | ICD-10-CM | POA: Diagnosis not present

## 2017-09-16 DIAGNOSIS — M545 Low back pain: Secondary | ICD-10-CM | POA: Diagnosis not present

## 2017-09-23 DIAGNOSIS — M5136 Other intervertebral disc degeneration, lumbar region: Secondary | ICD-10-CM | POA: Diagnosis not present

## 2017-09-23 DIAGNOSIS — R202 Paresthesia of skin: Secondary | ICD-10-CM | POA: Diagnosis not present

## 2017-09-23 DIAGNOSIS — M5442 Lumbago with sciatica, left side: Secondary | ICD-10-CM | POA: Diagnosis not present

## 2017-09-30 DIAGNOSIS — M5442 Lumbago with sciatica, left side: Secondary | ICD-10-CM | POA: Diagnosis not present

## 2017-09-30 DIAGNOSIS — R202 Paresthesia of skin: Secondary | ICD-10-CM | POA: Diagnosis not present

## 2017-09-30 DIAGNOSIS — Z79899 Other long term (current) drug therapy: Secondary | ICD-10-CM | POA: Diagnosis not present

## 2017-09-30 DIAGNOSIS — H6691 Otitis media, unspecified, right ear: Secondary | ICD-10-CM | POA: Diagnosis not present

## 2017-10-25 DIAGNOSIS — Z13228 Encounter for screening for other metabolic disorders: Secondary | ICD-10-CM | POA: Diagnosis not present

## 2017-10-25 DIAGNOSIS — Z1389 Encounter for screening for other disorder: Secondary | ICD-10-CM | POA: Diagnosis not present

## 2017-10-25 DIAGNOSIS — Z789 Other specified health status: Secondary | ICD-10-CM | POA: Diagnosis not present

## 2017-10-25 DIAGNOSIS — Z79899 Other long term (current) drug therapy: Secondary | ICD-10-CM | POA: Diagnosis not present

## 2017-10-28 DIAGNOSIS — F17210 Nicotine dependence, cigarettes, uncomplicated: Secondary | ICD-10-CM | POA: Diagnosis not present

## 2017-10-28 DIAGNOSIS — M5442 Lumbago with sciatica, left side: Secondary | ICD-10-CM | POA: Diagnosis not present

## 2017-10-28 DIAGNOSIS — Z0000 Encounter for general adult medical examination without abnormal findings: Secondary | ICD-10-CM | POA: Diagnosis not present

## 2017-10-28 DIAGNOSIS — M545 Low back pain: Secondary | ICD-10-CM | POA: Diagnosis not present

## 2017-10-28 DIAGNOSIS — M542 Cervicalgia: Secondary | ICD-10-CM | POA: Diagnosis not present

## 2017-12-02 DIAGNOSIS — Z79899 Other long term (current) drug therapy: Secondary | ICD-10-CM | POA: Diagnosis not present

## 2017-12-02 DIAGNOSIS — M5442 Lumbago with sciatica, left side: Secondary | ICD-10-CM | POA: Diagnosis not present

## 2017-12-02 DIAGNOSIS — R202 Paresthesia of skin: Secondary | ICD-10-CM | POA: Diagnosis not present

## 2017-12-02 DIAGNOSIS — Z6832 Body mass index (BMI) 32.0-32.9, adult: Secondary | ICD-10-CM | POA: Diagnosis not present

## 2017-12-30 DIAGNOSIS — M5136 Other intervertebral disc degeneration, lumbar region: Secondary | ICD-10-CM | POA: Diagnosis not present

## 2017-12-30 DIAGNOSIS — M542 Cervicalgia: Secondary | ICD-10-CM | POA: Diagnosis not present

## 2017-12-30 DIAGNOSIS — M5442 Lumbago with sciatica, left side: Secondary | ICD-10-CM | POA: Diagnosis not present

## 2017-12-30 DIAGNOSIS — I10 Essential (primary) hypertension: Secondary | ICD-10-CM | POA: Diagnosis not present

## 2018-01-17 ENCOUNTER — Encounter (HOSPITAL_COMMUNITY): Payer: Self-pay

## 2018-01-17 ENCOUNTER — Emergency Department (HOSPITAL_COMMUNITY)
Admission: EM | Admit: 2018-01-17 | Discharge: 2018-01-17 | Disposition: A | Discharge status: Home / Self Care | Payer: BLUE CROSS/BLUE SHIELD | Attending: Emergency Medicine | Admitting: Emergency Medicine

## 2018-01-17 DIAGNOSIS — R110 Nausea: Secondary | ICD-10-CM | POA: Diagnosis not present

## 2018-01-17 DIAGNOSIS — F17210 Nicotine dependence, cigarettes, uncomplicated: Secondary | ICD-10-CM | POA: Insufficient documentation

## 2018-01-17 DIAGNOSIS — Z79899 Other long term (current) drug therapy: Secondary | ICD-10-CM | POA: Insufficient documentation

## 2018-01-17 DIAGNOSIS — J111 Influenza due to unidentified influenza virus with other respiratory manifestations: Secondary | ICD-10-CM | POA: Diagnosis not present

## 2018-01-17 DIAGNOSIS — R51 Headache: Secondary | ICD-10-CM | POA: Diagnosis not present

## 2018-01-17 DIAGNOSIS — R6889 Other general symptoms and signs: Secondary | ICD-10-CM

## 2018-01-17 DIAGNOSIS — M791 Myalgia, unspecified site: Secondary | ICD-10-CM | POA: Diagnosis not present

## 2018-01-17 LAB — COMPREHENSIVE METABOLIC PANEL
ALT: 19 U/L (ref 0–44)
AST: 19 U/L (ref 15–41)
Albumin: 3.7 g/dL (ref 3.5–5.0)
Alkaline Phosphatase: 77 U/L (ref 38–126)
Anion gap: 7 (ref 5–15)
BUN: 24 mg/dL — ABNORMAL HIGH (ref 6–20)
CO2: 22 mmol/L (ref 22–32)
Calcium: 8.6 mg/dL — ABNORMAL LOW (ref 8.9–10.3)
Chloride: 107 mmol/L (ref 98–111)
Creatinine, Ser: 0.49 mg/dL (ref 0.44–1.00)
GFR calc Af Amer: >60 mL/min (ref >60)
GFR calc non Af Amer: >60 mL/min (ref >60)
Glucose, Bld: 116 mg/dL — ABNORMAL HIGH (ref 70–99)
Potassium: 3.6 mmol/L (ref 3.5–5.1)
Sodium: 136 mmol/L (ref 135–145)
Total Bilirubin: 0.4 mg/dL (ref 0.3–1.2)
Total Protein: 7.1 g/dL (ref 6.5–8.1)

## 2018-01-17 LAB — CBC WITH DIFFERENTIAL/PLATELET
Basophils Absolute: 0 10*3/uL (ref 0.0–0.1)
Basophils Relative: 0 %
Eosinophils Absolute: 0.2 10*3/uL (ref 0.0–0.7)
Eosinophils Relative: 2 %
HCT: 37.2 % (ref 36.0–46.0)
Hemoglobin: 12.4 g/dL (ref 12.0–15.0)
Lymphocytes Relative: 14 %
Lymphs Abs: 1 10*3/uL (ref 0.7–4.0)
MCH: 31.9 pg (ref 26.0–34.0)
MCHC: 33.3 g/dL (ref 30.0–36.0)
MCV: 95.6 fL (ref 78.0–100.0)
Monocytes Absolute: 0.9 10*3/uL (ref 0.1–1.0)
Monocytes Relative: 13 %
Neutro Abs: 5.2 10*3/uL (ref 1.7–7.7)
Neutrophils Relative %: 71 %
Platelets: 215 10*3/uL (ref 150–400)
RBC: 3.89 MIL/uL (ref 3.87–5.11)
RDW: 13 % (ref 11.5–15.5)
WBC: 7.2 10*3/uL (ref 4.0–10.5)

## 2018-01-17 LAB — I-STAT TROPONIN, ED: Troponin i, poc: 0.02 ng/mL (ref 0.00–0.08)

## 2018-01-17 LAB — LIPASE, BLOOD: Lipase: 29 U/L (ref 11–51)

## 2018-01-17 MED ORDER — ONDANSETRON HCL 4 MG PO TABS: 4.0000 mg | ORAL_TABLET | Freq: Four times a day (QID) | ORAL | 0 refills | Status: DC

## 2018-01-17 MED ORDER — SODIUM CHLORIDE 0.9 % IV BOLUS
1000.0000 mL | Freq: Once | INTRAVENOUS | Status: AC
  Administered 2018-01-17: 1000 mL via INTRAVENOUS

## 2018-01-17 MED ORDER — ONDANSETRON HCL 4 MG/2ML IJ SOLN
4.0000 mg | Freq: Once | INTRAMUSCULAR | Status: AC
  Administered 2018-01-17: 4 mg via INTRAVENOUS
  Filled 2018-01-17: qty 2

## 2018-01-17 NOTE — ED Triage Notes (Signed)
Pt states she got to work this am and started feeling very hot, body aches, headache, nausea, no vomiting or diarrhea.

## 2018-01-17 NOTE — ED Provider Notes (Signed)
[facility] EMERGENCY DEPARTMENT Provider Note   CSN: [id number] Arrival date & time: 01/17/18  0548     History   Chief Complaint Chief Complaint  Patient presents with  . Generalized Body Aches    HPI Brenda Taylor is a 47 y.o. female.  Patient presents to the emergency department from work.  She reports that she had been at work for 45 minutes when she started to feel ill.  She was in the back room and it was very hot in the room, there was no air conditioning.  She reports that she started getting "swimmy headed" and started feeling nauseated.  She came to the ER and she feels like she is having spasms in all of her muscles.  She is hurting all over including a headache.  She felt well when she was on her way to work this morning.  No sick contacts.     Past Medical History:  Diagnosis Date  . Anxiety   . Chronic pain   . DDD (degenerative disc disease)   . HBP (high blood pressure)   . Hernia   . Ulcer disease     Patient Active Problem List   Diagnosis Date Noted  . HNP (herniated nucleus pulposus), lumbar 07/03/2013  . Unspecified constipation 10/31/2012  . Rotator cuff syndrome of left shoulder 10/09/2011  . ANEMIA 10/24/2009  . HEMOCCULT POSITIVE STOOL 10/24/2009  . ABDOMINAL PAIN, GENERALIZED 10/24/2009  . SHOULDER PAIN, RIGHT 05/28/2007  . HIGH BLOOD PRESSURE 05/23/2007    Past Surgical History:  Procedure Laterality Date  . APPENDECTOMY    . BACK SURGERY     x 4  . CARPAL TUNNEL RELEASE     bilateral  . CHOLECYSTECTOMY    . COLONOSCOPY  10/20/2009   TIW:PYKDXIP anal canal/left-side diverticula and multiple polyps. poor prep compromised exam. Next TCS 09/2014.  Marland Kitchen ESOPHAGOGASTRODUODENOSCOPY  03/06/2010   JAS:NKNL quadrant distal esophageal/patent tubular esophagus/small HH, antral erosions and ulcerations. Bx negative for persistent H.pylori  . ESOPHAGOGASTRODUODENOSCOPY  10/20/2009   ZJQ:BHALP HH/prepyloric antral ulcer s/p bx (+h.pylori), ERE  .  INTRAUTERINE DEVICE INSERTION    . MASTECTOMY, PARTIAL     x 2  . MOUTH SURGERY    . ROTATOR CUFF REPAIR     right  . TUBAL LIGATION       OB History    Gravida  2   Para  2   Term  2   Preterm      AB      Living        SAB      TAB      Ectopic      Multiple      Live Births               Home Medications    Prior to Admission medications   Medication Sig Start Date End Date Taking? Authorizing Provider  Aspirin-Acetaminophen (GOODY BODY PAIN) 500-325 MG PACK Take 1-2 packets by mouth once as needed (for pain).    [provider]  azithromycin (ZITHROMAX) 250 MG tablet Take 2 po the first day then once a day for the next 4 days. Patient not taking: Reported on 09/08/2017 08/11/17   Rolland Porter, MD  cyclobenzaprine (FLEXERIL) 10 MG tablet Take 1 tablet (10 mg total) by mouth 3 (three) times daily as needed. 09/09/17   Triplett, Tammy, PA-C  doxycycline (VIBRAMYCIN) 100 MG capsule Take 1 capsule (100 mg total) by mouth 2 (  two) times daily. Patient not taking: Reported on 09/08/2017 08/11/17   Rolland Porter, MD  ondansetron (ZOFRAN) 4 MG tablet Take 1 tablet (4 mg total) by mouth every 6 (six) hours. 01/17/18   Orpah Greek, MD  traMADol (ULTRAM) 50 MG tablet Take 1 tablet (50 mg total) by mouth every 6 (six) hours as needed. 09/09/17   Kem Parkinson, PA-C    Family History Family History  Problem Relation Age of Onset  . Heart disease Unknown   . Arthritis Unknown   . Cancer Unknown   . Asthma Unknown   . Diabetes Unknown     Social History Social History   Tobacco Use  . Smoking status: Current Every Day Smoker    Packs/day: 1.00    Types: Cigarettes  . Smokeless tobacco: Never Used  Substance Use Topics  . Alcohol use: No  . Drug use: Yes    Frequency: 7.0 times per week    Types: Marijuana    Comment: Last use 09/07/17     Allergies   Amoxicillin and Aspirin   Review of Systems Review of Systems  Respiratory: Negative  for shortness of breath.   Cardiovascular: Negative for chest pain.  Gastrointestinal: Positive for nausea.  Musculoskeletal: Positive for myalgias.  Neurological: Positive for headaches.  All other systems reviewed and are negative.    Physical Exam Updated Vital Signs BP 117/72 (BP Location: Left Arm)   Pulse 93   Temp 98.3 F (36.8 C) (Oral)   Resp 16   Ht 5\' 8"  (1.727 m)   Wt 88 kg (194 lb)   SpO2 98%   BMI 29.50 kg/m   Physical Exam  Constitutional: She is oriented to person, place, and time. She appears well-developed and well-nourished. No distress.  HENT:  Head: Normocephalic and atraumatic.  Right Ear: Hearing normal.  Left Ear: Hearing normal.  Nose: Nose normal.  Mouth/Throat: Oropharynx is clear and moist and mucous membranes are normal.  Eyes: Pupils are equal, round, and reactive to light. Conjunctivae and EOM are normal.  Neck: Normal range of motion. Neck supple.  Cardiovascular: Regular rhythm, S1 normal and S2 normal. Exam reveals no gallop and no friction rub.  No murmur heard. Pulmonary/Chest: Effort normal and breath sounds normal. No respiratory distress. She exhibits no tenderness.  Abdominal: Soft. Normal appearance and bowel sounds are normal. There is no hepatosplenomegaly. There is no tenderness. There is no rebound, no guarding, no tenderness at McBurney's point and negative Murphy's sign. No hernia.  Musculoskeletal: Normal range of motion.  Neurological: She is alert and oriented to person, place, and time. She has normal strength. No cranial nerve deficit or sensory deficit. Coordination normal. GCS eye subscore is 4. GCS verbal subscore is 5. GCS motor subscore is 6.  Skin: Skin is warm, dry and intact. No rash noted. No cyanosis.  Psychiatric: She has a normal mood and affect. Her speech is normal and behavior is normal. Thought content normal.  Nursing note and vitals reviewed.    ED Treatments / Results  Labs (all labs ordered are  listed, but only abnormal results are displayed) Labs Reviewed  COMPREHENSIVE METABOLIC PANEL - Abnormal; Notable for the following components:      Result Value   Glucose, Bld 116 (*)    BUN 24 (*)    Calcium 8.6 (*)    All other components within normal limits  CBC WITH DIFFERENTIAL/PLATELET  LIPASE, BLOOD  I-STAT TROPONIN, ED    EKG EKG Interpretation  Date/Time:  Friday January 17 2018 06:07:53 EDT Ventricular Rate:  85 PR Interval:    QRS Duration: 111 QT Interval:  397 QTC Calculation: 473 R Axis:   83 Text Interpretation:  Sinus rhythm Normal ECG Confirmed by Orpah Greek [phone]) on 01/17/2018 6:12:56 AM   Radiology No results found.  Procedures Procedures (including critical care time)  Medications Ordered in ED Medications  sodium chloride 0.9 % bolus 1,000 mL (1,000 mLs Intravenous New Bag/Given 01/17/18 0609)  ondansetron (ZOFRAN) injection 4 mg (4 mg Intravenous Given 01/17/18 0609)     Initial Impression / Assessment and Plan / ED Course  I have reviewed the triage vital signs and the nursing notes.  Pertinent labs & imaging results that were available during my care of the patient were reviewed by me and considered in my medical decision making (see chart for details).     Patient presents with acute onset of feeling dizzy, weak, hot and flushed while in a heated environment at work earlier.  She became nauseated but has not had vomiting.  No chest pain or shortness of breath.  Work-up has been unremarkable.  Normal labs including troponin.  EKG is normal.  Patient administered IV fluids and Zofran with improvement.  Final Clinical Impressions(s) / ED Diagnoses   Final diagnoses:  Flu-like symptoms    ED Discharge Orders        Ordered    ondansetron (ZOFRAN) 4 MG tablet  Every 6 hours     01/17/18 0648       Orpah Greek, MD 01/17/18 [phone]

## 2018-03-08 DIAGNOSIS — T25221A Burn of second degree of right foot, initial encounter: Secondary | ICD-10-CM | POA: Diagnosis not present

## 2018-03-08 DIAGNOSIS — Y990 Civilian activity done for income or pay: Secondary | ICD-10-CM | POA: Diagnosis not present

## 2018-03-08 DIAGNOSIS — R238 Other skin changes: Secondary | ICD-10-CM | POA: Diagnosis not present

## 2018-03-08 DIAGNOSIS — X102XXA Contact with fats and cooking oils, initial encounter: Secondary | ICD-10-CM | POA: Diagnosis not present

## 2018-03-08 DIAGNOSIS — Y9269 Other specified industrial and construction area as the place of occurrence of the external cause: Secondary | ICD-10-CM | POA: Diagnosis not present

## 2018-03-10 DIAGNOSIS — T300 Burn of unspecified body region, unspecified degree: Secondary | ICD-10-CM | POA: Insufficient documentation

## 2018-07-01 ENCOUNTER — Other Ambulatory Visit: Payer: Self-pay

## 2018-07-01 ENCOUNTER — Emergency Department (HOSPITAL_COMMUNITY)
Admission: EM | Admit: 2018-07-01 | Discharge: 2018-07-01 | Disposition: A | Discharge status: Home / Self Care | Payer: BLUE CROSS/BLUE SHIELD | Attending: Emergency Medicine | Admitting: Emergency Medicine

## 2018-07-01 ENCOUNTER — Encounter (HOSPITAL_COMMUNITY): Payer: Self-pay | Admitting: Emergency Medicine

## 2018-07-01 DIAGNOSIS — F17210 Nicotine dependence, cigarettes, uncomplicated: Secondary | ICD-10-CM | POA: Diagnosis not present

## 2018-07-01 DIAGNOSIS — H5789 Other specified disorders of eye and adnexa: Secondary | ICD-10-CM | POA: Diagnosis not present

## 2018-07-01 DIAGNOSIS — Z79899 Other long term (current) drug therapy: Secondary | ICD-10-CM | POA: Insufficient documentation

## 2018-07-01 DIAGNOSIS — H00014 Hordeolum externum left upper eyelid: Secondary | ICD-10-CM

## 2018-07-01 DIAGNOSIS — H1032 Unspecified acute conjunctivitis, left eye: Secondary | ICD-10-CM | POA: Diagnosis not present

## 2018-07-01 MED ORDER — ERYTHROMYCIN 5 MG/GM OP OINT
TOPICAL_OINTMENT | Freq: Once | OPHTHALMIC | Status: AC
  Administered 2018-07-01: 1 via OPHTHALMIC
  Filled 2018-07-01: qty 3.5

## 2018-07-01 NOTE — ED Triage Notes (Signed)
Onset yesterday, left eye burning. Cough and cold all week, Grandchildren had pink eye last week.

## 2018-07-01 NOTE — Discharge Instructions (Addendum)
Apply a small bead of the ointment to your left eye 4 times a day.  Use the ointment for at least 5 to 7 days.  Apply warm wet compresses on and off to your eye 2-3 times a day.  Avoid rubbing your eyes.  Follow-up with your primary doctor or return to the ER for any worsening symptoms.

## 2018-07-01 NOTE — ED Provider Notes (Signed)
[facility] EMERGENCY DEPARTMENT Provider Note   CSN: [id number] Arrival date & time: 07/01/18  3419     History   Chief Complaint Chief Complaint  Patient presents with  . Conjunctivitis    HPI Brenda Taylor is a 47 y.o. female.  HPI   Brenda Taylor is a 47 y.o. female who presents to the Emergency Department complaining of itching and burning of the left eye.  Symptoms began yesterday.  She has noticed some crusting and drainage of her left eye.  She also endorses nonproductive cough and nasal congestion for 1 week.  She states that her grandchild has recently been diagnosed with "pinkeye."  She has noticed some swelling along her left upper eyelid as well.  She denies fever, shortness of breath or chest pain.  No sore throat or visual changes.  No dizziness or headache.  She does wear glasses for reading but does not wear contacts.    Past Medical History:  Diagnosis Date  . Anxiety   . Chronic pain   . DDD (degenerative disc disease)   . HBP (high blood pressure)   . Hernia   . Ulcer disease     Patient Active Problem List   Diagnosis Date Noted  . HNP (herniated nucleus pulposus), lumbar 07/03/2013  . Unspecified constipation 10/31/2012  . Rotator cuff syndrome of left shoulder 10/09/2011  . ANEMIA 10/24/2009  . HEMOCCULT POSITIVE STOOL 10/24/2009  . ABDOMINAL PAIN, GENERALIZED 10/24/2009  . SHOULDER PAIN, RIGHT 05/28/2007  . HIGH BLOOD PRESSURE 05/23/2007    Past Surgical History:  Procedure Laterality Date  . APPENDECTOMY    . BACK SURGERY     x 4  . CARPAL TUNNEL RELEASE     bilateral  . CHOLECYSTECTOMY    . COLONOSCOPY  10/20/2009   [id number] anal canal/left-side diverticula and multiple polyps. poor prep compromised exam. Next TCS 09/2014.  Marland Kitchen ESOPHAGOGASTRODUODENOSCOPY  03/06/2010   JJH:ERDE quadrant distal esophageal/patent tubular esophagus/small HH, antral erosions and ulcerations. Bx negative for persistent H.pylori  .  ESOPHAGOGASTRODUODENOSCOPY  10/20/2009   [id number] HH/prepyloric antral ulcer s/p bx (+h.pylori), ERE  . INTRAUTERINE DEVICE INSERTION    . MASTECTOMY, PARTIAL     x 2  . MOUTH SURGERY    . ROTATOR CUFF REPAIR     right  . TUBAL LIGATION       OB History    Gravida  2   Para  2   Term  2   Preterm      AB      Living        SAB      TAB      Ectopic      Multiple      Live Births               Home Medications    Prior to Admission medications   Medication Sig Start Date End Date Taking? Authorizing Provider  Aspirin-Acetaminophen (GOODY BODY PAIN) 500-325 MG PACK Take 1-2 packets by mouth once as needed (for pain).    [provider]  azithromycin (ZITHROMAX) 250 MG tablet Take 2 po the first day then once a day for the next 4 days. Patient not taking: Reported on 09/08/2017 08/11/17   Rolland Porter, MD  cyclobenzaprine (FLEXERIL) 10 MG tablet Take 1 tablet (10 mg total) by mouth 3 (three) times daily as needed. 09/09/17   Shanyla Marconi, PA-C  doxycycline (VIBRAMYCIN) 100 MG capsule Take 1 capsule (100  mg total) by mouth 2 (two) times daily. Patient not taking: Reported on 09/08/2017 08/11/17   Rolland Porter, MD  ondansetron (ZOFRAN) 4 MG tablet Take 1 tablet (4 mg total) by mouth every 6 (six) hours. 01/17/18   Orpah Greek, MD  traMADol (ULTRAM) 50 MG tablet Take 1 tablet (50 mg total) by mouth every 6 (six) hours as needed. 09/09/17   Kem Parkinson, PA-C    Family History Family History  Problem Relation Age of Onset  . Heart disease Unknown   . Arthritis Unknown   . Cancer Unknown   . Asthma Unknown   . Diabetes Unknown     Social History Social History   Tobacco Use  . Smoking status: Current Every Day Smoker    Packs/day: 1.00    Types: Cigarettes  . Smokeless tobacco: Never Used  Substance Use Topics  . Alcohol use: No  . Drug use: Yes    Frequency: 7.0 times per week    Types: Marijuana    Comment: Last use 09/07/17      Allergies   Amoxicillin and Aspirin   Review of Systems Review of Systems  Constitutional: Negative for activity change, appetite change, chills and fever.  HENT: Positive for congestion, rhinorrhea and sneezing. Negative for facial swelling, sore throat and trouble swallowing.   Eyes: Positive for discharge, redness and itching. Negative for visual disturbance.  Respiratory: Positive for cough. Negative for chest tightness, shortness of breath, wheezing and stridor.   Gastrointestinal: Negative for nausea and vomiting.  Musculoskeletal: Negative for neck pain and neck stiffness.  Skin: Negative for rash.  Neurological: Negative for dizziness, syncope, speech difficulty, weakness, numbness and headaches.  Hematological: Negative for adenopathy.  Psychiatric/Behavioral: Negative for confusion.     Physical Exam Updated Vital Signs BP (!) 142/94 (BP Location: Left Arm)   Pulse 84   Temp 97.9 F (36.6 C) (Oral)   Resp 18   Ht 5\' 9"  (1.753 m)   Wt 108.9 kg   SpO2 99%   BMI 35.44 kg/m   Physical Exam  Constitutional: She is oriented to person, place, and time. She appears well-developed and well-nourished. No distress.  HENT:  Head: Atraumatic.  Right Ear: Tympanic membrane and ear canal normal.  Left Ear: Tympanic membrane and ear canal normal.  Nose: Mucosal edema and rhinorrhea present.  Mouth/Throat: Uvula is midline, oropharynx is clear and moist and mucous membranes are normal. No trismus in the jaw. No uvula swelling. No oropharyngeal exudate, posterior oropharyngeal edema, posterior oropharyngeal erythema or tonsillar abscesses.  Eyes: Pupils are equal, round, and reactive to light. EOM are normal. Lids are everted and swept, no foreign bodies found. Right eye exhibits no chemosis. Left eye exhibits discharge and hordeolum. Left eye exhibits no chemosis and no exudate. Left conjunctiva is injected. Left conjunctiva has no hemorrhage. No scleral icterus.    Neck:  Normal range of motion, full passive range of motion without pain and phonation normal. Neck supple.  Cardiovascular: Normal rate, regular rhythm and intact distal pulses.  No murmur heard. Pulmonary/Chest: Effort normal and breath sounds normal. No respiratory distress. She has no wheezes. She has no rales.  Abdominal: Soft. She exhibits no distension. There is no tenderness. There is no rebound and no guarding.  Musculoskeletal: She exhibits no edema.  Lymphadenopathy:    She has no cervical adenopathy.  Neurological: She is alert and oriented to person, place, and time. She exhibits normal muscle tone. Coordination normal.  Skin: Skin is  warm and dry. Capillary refill takes less than 2 seconds.  Nursing note and vitals reviewed.    ED Treatments / Results  Labs (all labs ordered are listed, but only abnormal results are displayed) Labs Reviewed - No data to display  EKG None  Radiology No results found.  Procedures Procedures (including critical care time)  Medications Ordered in ED Medications  erythromycin ophthalmic ointment (has no administration in time range)     Initial Impression / Assessment and Plan / ED Course  I have reviewed the triage vital signs and the nursing notes.  Pertinent labs & imaging results that were available during my care of the patient were reviewed by me and considered in my medical decision making (see chart for details).    Pt well appearing, non-toxic.  Left hordeolum and like conjunctivitis.  Erythromycin oint dispensed.  She agrees to warm compresses, PCP f/u if needed.      Final Clinical Impressions(s) / ED Diagnoses   Final diagnoses:  Acute conjunctivitis of left eye, unspecified acute conjunctivitis type  Hordeolum externum of left upper eyelid    ED Discharge Orders    None       Kem Parkinson, PA-C 07/01/18 1705    Davonna Belling, MD 07/02/18 1053

## 2019-04-23 ENCOUNTER — Ambulatory Visit (INDEPENDENT_AMBULATORY_CARE_PROVIDER_SITE_OTHER): Payer: BC Managed Care – PPO | Admitting: Family Medicine

## 2019-04-23 ENCOUNTER — Encounter: Payer: Self-pay | Admitting: Family Medicine

## 2019-04-23 ENCOUNTER — Other Ambulatory Visit: Payer: Self-pay

## 2019-04-23 VITALS — BP 179/90 | HR 105 | Temp 98.6°F | Ht 69.0 in | Wt 241.4 lb

## 2019-04-23 DIAGNOSIS — Z01118 Encounter for examination of ears and hearing with other abnormal findings: Secondary | ICD-10-CM

## 2019-04-23 DIAGNOSIS — I10 Essential (primary) hypertension: Secondary | ICD-10-CM | POA: Diagnosis not present

## 2019-04-23 DIAGNOSIS — R7309 Other abnormal glucose: Secondary | ICD-10-CM | POA: Diagnosis not present

## 2019-04-23 DIAGNOSIS — L989 Disorder of the skin and subcutaneous tissue, unspecified: Secondary | ICD-10-CM | POA: Diagnosis not present

## 2019-04-23 DIAGNOSIS — M2550 Pain in unspecified joint: Secondary | ICD-10-CM

## 2019-04-23 DIAGNOSIS — D649 Anemia, unspecified: Secondary | ICD-10-CM

## 2019-04-23 LAB — POCT GLYCOSYLATED HEMOGLOBIN (HGB A1C): Hemoglobin A1C: 5.6 % (ref 4.0–5.6)

## 2019-04-23 MED ORDER — LISINOPRIL 20 MG PO TABS: 20.0000 mg | ORAL_TABLET | Freq: Every day | ORAL | 1 refills | Status: DC

## 2019-04-23 NOTE — Patient Instructions (Addendum)
labwork-[facility] referral ENT referral Lisinopril -rx-start
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
## 2019-04-24 DIAGNOSIS — I10 Essential (primary) hypertension: Secondary | ICD-10-CM | POA: Diagnosis not present

## 2019-04-24 DIAGNOSIS — M2550 Pain in unspecified joint: Secondary | ICD-10-CM | POA: Diagnosis not present

## 2019-04-24 DIAGNOSIS — D649 Anemia, unspecified: Secondary | ICD-10-CM | POA: Diagnosis not present

## 2019-04-27 LAB — RHEUMATOID FACTOR: Rheumatoid fact SerPl-aCnc: <14 IU/mL (ref <14)

## 2019-04-27 LAB — COMPREHENSIVE METABOLIC PANEL
AG Ratio: 1.2 (calc) (ref 1.0–2.5)
ALT: 11 U/L (ref 6–29)
AST: 15 U/L (ref 10–35)
Albumin: 3.8 g/dL (ref 3.6–5.1)
Alkaline phosphatase (APISO): 83 U/L (ref 37–153)
BUN: 15 mg/dL (ref 7–25)
CO2: 25 mmol/L (ref 20–32)
Calcium: 9 mg/dL (ref 8.6–10.4)
Chloride: 109 mmol/L (ref 98–110)
Creat: 0.55 mg/dL (ref 0.50–1.05)
Globulin: 3.1 g/dL (calc) (ref 1.9–3.7)
Glucose, Bld: 103 mg/dL — ABNORMAL HIGH (ref 65–99)
Potassium: 4.1 mmol/L (ref 3.5–5.3)
Sodium: 140 mmol/L (ref 135–146)
Total Bilirubin: 0.3 mg/dL (ref 0.2–1.2)
Total Protein: 6.9 g/dL (ref 6.1–8.1)

## 2019-04-27 LAB — CBC WITH DIFFERENTIAL/PLATELET
Absolute Monocytes: 879 cells/uL (ref 200–950)
Basophils Absolute: 71 cells/uL (ref 0–200)
Basophils Relative: 0.7 %
Eosinophils Absolute: 273 cells/uL (ref 15–500)
Eosinophils Relative: 2.7 %
HCT: 35.9 % (ref 35.0–45.0)
Hemoglobin: 11.9 g/dL (ref 11.7–15.5)
Lymphs Abs: 2515 cells/uL (ref 850–3900)
MCH: 29.4 pg (ref 27.0–33.0)
MCHC: 33.1 g/dL (ref 32.0–36.0)
MCV: 88.6 fL (ref 80.0–100.0)
MPV: 10.7 fL (ref 7.5–12.5)
Monocytes Relative: 8.7 %
Neutro Abs: 6363 cells/uL (ref 1500–7800)
Neutrophils Relative %: 63 %
Platelets: 292 10*3/uL (ref 140–400)
RBC: 4.05 10*6/uL (ref 3.80–5.10)
RDW: 13 % (ref 11.0–15.0)
Total Lymphocyte: 24.9 %
WBC: 10.1 10*3/uL (ref 3.8–10.8)

## 2019-04-27 LAB — TSH: TSH: 2.42 mIU/L

## 2019-04-27 LAB — ANA: Anti Nuclear Antibody (ANA): NEGATIVE

## 2019-04-28 ENCOUNTER — Other Ambulatory Visit: Payer: Self-pay

## 2019-04-28 ENCOUNTER — Encounter (HOSPITAL_COMMUNITY): Payer: Self-pay | Admitting: Emergency Medicine

## 2019-04-28 ENCOUNTER — Emergency Department (HOSPITAL_COMMUNITY)
Admission: EM | Admit: 2019-04-28 | Discharge: 2019-04-28 | Disposition: A | Discharge status: Home / Self Care | Payer: BC Managed Care – PPO | Attending: Emergency Medicine | Admitting: Emergency Medicine

## 2019-04-28 DIAGNOSIS — Y999 Unspecified external cause status: Secondary | ICD-10-CM | POA: Insufficient documentation

## 2019-04-28 DIAGNOSIS — Y93I9 Activity, other involving external motion: Secondary | ICD-10-CM | POA: Diagnosis not present

## 2019-04-28 DIAGNOSIS — S161XXA Strain of muscle, fascia and tendon at neck level, initial encounter: Secondary | ICD-10-CM | POA: Diagnosis not present

## 2019-04-28 DIAGNOSIS — I10 Essential (primary) hypertension: Secondary | ICD-10-CM | POA: Diagnosis not present

## 2019-04-28 DIAGNOSIS — F17210 Nicotine dependence, cigarettes, uncomplicated: Secondary | ICD-10-CM | POA: Insufficient documentation

## 2019-04-28 DIAGNOSIS — S199XXA Unspecified injury of neck, initial encounter: Secondary | ICD-10-CM | POA: Diagnosis not present

## 2019-04-28 DIAGNOSIS — Y92410 Unspecified street and highway as the place of occurrence of the external cause: Secondary | ICD-10-CM | POA: Diagnosis not present

## 2019-04-28 MED ORDER — CYCLOBENZAPRINE HCL 10 MG PO TABS: 10.0000 mg | ORAL_TABLET | Freq: Two times a day (BID) | ORAL | 0 refills | Status: DC | PRN

## 2019-04-28 MED ORDER — NAPROXEN 500 MG PO TABS: 500.0000 mg | ORAL_TABLET | Freq: Two times a day (BID) | ORAL | 0 refills | Status: DC
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
## 2019-04-28 NOTE — Discharge Instructions (Addendum)
Naproxen for pain twice daily, cyclobenzaprine for muscle spasms, cold compresses, rest for 24 hours, see your doctor as needed
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
## 2019-04-29 ENCOUNTER — Ambulatory Visit (INDEPENDENT_AMBULATORY_CARE_PROVIDER_SITE_OTHER): Payer: BC Managed Care – PPO | Admitting: Family Medicine

## 2019-04-29 ENCOUNTER — Ambulatory Visit (HOSPITAL_COMMUNITY)
Admission: RE | Admit: 2019-04-29 | Discharge: 2019-04-29 | Disposition: A | Discharge status: Home / Self Care | Payer: BC Managed Care – PPO | Source: Ambulatory Visit | Attending: Family Medicine | Admitting: Family Medicine

## 2019-04-29 VITALS — BP 141/81 | HR 80 | Temp 98.0°F | Ht 69.0 in | Wt 243.2 lb

## 2019-04-29 DIAGNOSIS — M25512 Pain in left shoulder: Secondary | ICD-10-CM | POA: Diagnosis not present

## 2019-04-29 DIAGNOSIS — M542 Cervicalgia: Secondary | ICD-10-CM

## 2019-04-29 DIAGNOSIS — S199XXA Unspecified injury of neck, initial encounter: Secondary | ICD-10-CM | POA: Diagnosis not present

## 2019-04-29 MED ORDER — KETOROLAC TROMETHAMINE 15 MG/ML IJ SOLN: 15.0000 mg | Freq: Once | INTRAMUSCULAR | Status: AC

## 2019-04-29 NOTE — Patient Instructions (Addendum)
Topical cream-be careful with heat activation Heat-be careful with interactions  Take flexeril today-Do not drive or operate heavy equipment  Spine surgery referral  Cervical spine xrays today  Shoulder xrays today
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
## 2019-04-30 ENCOUNTER — Telehealth: Payer: Self-pay | Admitting: Family Medicine

## 2019-04-30 NOTE — Telephone Encounter (Signed)
Brenda Taylor is calling in regards to the referral for [facility]. She said it was placed urgent and Dr. Vertell Limber did not see anything urgent about the referral. He said in order for it to be urgent an MRI would need to be ordered.
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
## 2019-05-01 NOTE — Telephone Encounter (Signed)
I left patient a detailed message in regards to recommendation

## 2019-05-07 ENCOUNTER — Other Ambulatory Visit: Payer: Self-pay

## 2019-05-07 ENCOUNTER — Ambulatory Visit (INDEPENDENT_AMBULATORY_CARE_PROVIDER_SITE_OTHER): Payer: BC Managed Care – PPO | Admitting: Family Medicine

## 2019-05-07 VITALS — BP 149/96 | HR 103 | Temp 98.1°F | Ht 69.0 in | Wt 246.2 lb

## 2019-05-07 DIAGNOSIS — F419 Anxiety disorder, unspecified: Secondary | ICD-10-CM | POA: Diagnosis not present

## 2019-05-07 DIAGNOSIS — I10 Essential (primary) hypertension: Secondary | ICD-10-CM

## 2019-05-07 DIAGNOSIS — R7309 Other abnormal glucose: Secondary | ICD-10-CM | POA: Diagnosis not present

## 2019-05-07 DIAGNOSIS — F411 Generalized anxiety disorder: Secondary | ICD-10-CM | POA: Insufficient documentation

## 2019-05-07 LAB — POCT GLYCOSYLATED HEMOGLOBIN (HGB A1C): Hemoglobin A1C: 5.5 % (ref 4.0–5.6)

## 2019-05-07 MED ORDER — HYDROCHLOROTHIAZIDE 25 MG PO TABS: 25.0000 mg | ORAL_TABLET | Freq: Every day | ORAL | 1 refills | Status: DC

## 2019-05-07 MED ORDER — BUSPIRONE HCL 10 MG PO TABS: 10.0000 mg | ORAL_TABLET | Freq: Three times a day (TID) | ORAL | 1 refills | Status: DC

## 2019-05-07 NOTE — Progress Notes (Signed)
Established Patient Office Visit  Subjective:  Patient ID: SANYE PINT, female    DOB: Aug 25, 1970  Age: 48 y.o. MRN: [id number]  CC:  Chief Complaint  Patient presents with  . Hypertension    f/u  . Labs Only    results    HPI Brenda Taylor presents for HTN-137/79 at home, pt states she is feeling better but continues to struggle with fatigue and DDD. Pt states she feels anxiety is becoming overwhelming  Past Medical History:  Diagnosis Date  . Anxiety   . Chronic pain   . DDD (degenerative disc disease)   . HBP (high blood pressure)   . Hernia   . Ulcer disease     Past Surgical History:  Procedure Laterality Date  . APPENDECTOMY    . BACK SURGERY     x 4  . CARPAL TUNNEL RELEASE     bilateral  . CHOLECYSTECTOMY    . COLONOSCOPY  10/20/2009   [id number] anal canal/left-side diverticula and multiple polyps. poor prep compromised exam. Next TCS 09/2014.  Marland Kitchen ESOPHAGOGASTRODUODENOSCOPY  03/06/2010   [id number] quadrant distal esophageal/patent tubular esophagus/small HH, antral erosions and ulcerations. Bx negative for persistent H.pylori  . ESOPHAGOGASTRODUODENOSCOPY  10/20/2009   [id number] HH/prepyloric antral ulcer s/p bx (+h.pylori), ERE  . INTRAUTERINE DEVICE INSERTION    . MASTECTOMY, PARTIAL     x 2  . MOUTH SURGERY    . ROTATOR CUFF REPAIR     right  . TUBAL LIGATION      Family History  Problem Relation Age of Onset  . Heart disease Other   . Arthritis Other   . Cancer Other   . Asthma Other   . Diabetes Other     Social History   Socioeconomic History  . Marital status: Widowed    Spouse name: Not on file  . Number of children: Not on file  . Years of education: 56  . Highest education level: Not on file  Occupational History  . Occupation: Investment banker, operational  . Financial resource strain: Not on file  . Food insecurity    Worry: Not on file    Inability: Not on file  . Transportation needs    Medical: Not on file    Non-medical: Not on  file  Tobacco Use  . Smoking status: Current Every Day Smoker    Packs/day: 1.00    Types: Cigarettes  . Smokeless tobacco: Never Used  Substance and Sexual Activity  . Alcohol use: No  . Drug use: Yes    Frequency: 7.0 times per week    Types: Marijuana    Comment: Last use 09/07/17  . Sexual activity: Yes    Birth control/protection: I.U.D.  Lifestyle  . Physical activity    Days per week: Not on file    Minutes per session: Not on file  . Stress: Not on file  Relationships  . Social Herbalist on phone: Not on file    Gets together: Not on file    Attends religious service: Not on file    Active member of club or organization: Not on file    Attends meetings of clubs or organizations: Not on file    Relationship status: Not on file  . Intimate partner violence    Fear of current or ex partner: Not on file    Emotionally abused: Not on file    Physically abused: Not on file    Forced  sexual activity: Not on file  Other Topics Concern  . Not on file  Social History Narrative  . Not on file    Outpatient Medications Prior to Visit  Medication Sig Dispense Refill  . cyclobenzaprine (FLEXERIL) 10 MG tablet Take 1 tablet (10 mg total) by mouth 2 (two) times daily as needed for muscle spasms. 20 tablet 0  . lisinopril (ZESTRIL) 20 MG tablet Take 1 tablet (20 mg total) by mouth daily. 90 tablet 1  . naproxen (NAPROSYN) 500 MG tablet Take 1 tablet (500 mg total) by mouth 2 (two) times daily with a meal. 30 tablet 0   No facility-administered medications prior to visit.     Allergies  Allergen Reactions  . Amoxicillin     Reaction is unknown  . Aspirin     Cannot take due to ulcers    ROS Review of Systems  Constitutional: Positive for fatigue.  HENT: Negative.   Respiratory: Negative.   Cardiovascular: Negative.   Gastrointestinal:       GERD  Musculoskeletal: Positive for back pain.  Neurological: Negative.   Psychiatric/Behavioral: The patient is  nervous/anxious.       Objective:    Physical Exam  Constitutional: She is oriented to person, place, and time. She appears well-developed and well-nourished.  Cardiovascular: Normal rate and regular rhythm.  Pulmonary/Chest: Effort normal and breath sounds normal.  Neurological: She is oriented to person, place, and time.  Psychiatric: She has a normal mood and affect.    BP (!) 149/96 (BP Location: Left Arm, Patient Position: Sitting, Cuff Size: Normal)   Pulse (!) 103   Temp 98.1 F (36.7 C) (Oral)   Ht 5\' 9"  (1.753 m)   Wt 246 lb 3.2 oz (111.7 kg)   SpO2 98%   BMI 36.36 kg/m  Wt Readings from Last 3 Encounters:  05/07/19 246 lb 3.2 oz (111.7 kg)  04/29/19 243 lb 3.2 oz (110.3 kg)  04/28/19 243 lb (110.2 kg)     Health Maintenance Due  Topic Date Due  . HIV Screening  09/22/1983  . PAP SMEAR-Modifier  09/21/1989  . MAMMOGRAM  09/22/2018  . COLONOSCOPY  09/22/2018  . INFLUENZA VACCINE  02/21/2019    Lab Results  Component Value Date   TSH 2.42 04/24/2019   Lab Results  Component Value Date   WBC 10.1 04/24/2019   HGB 11.9 04/24/2019   HCT 35.9 04/24/2019   MCV 88.6 04/24/2019   PLT 292 04/24/2019   Lab Results  Component Value Date   NA 140 04/24/2019   K 4.1 04/24/2019   CO2 25 04/24/2019   GLUCOSE 103 (H) 04/24/2019   BUN 15 04/24/2019   CREATININE 0.55 04/24/2019   BILITOT 0.3 04/24/2019   ALKPHOS 77 01/17/2018   AST 15 04/24/2019   ALT 11 04/24/2019   PROT 6.9 04/24/2019   ALBUMIN 3.7 01/17/2018   CALCIUM 9.0 04/24/2019   ANIONGAP 7 01/17/2018   Lab Results  Component Value Date   HGBA1C 5.6 04/23/2019     Assessment & Plan:  1. Hypertension, unspecified type Lisinopril -add HCTZ 25mg -trial of 1/2 tablet + lisinopril Repeat BMP. Pt states palpitations and increase in HR when under pressure  2. Anxiety Severe-d/w pt -good support but needs medication. Took benzos in the past but desires NOT to be on benzos for anxiety Start Buspar  10mg  TID  3. Elevated glucose A1c 5.5%  Follow-up:  45month Fasting labwork prior to appt Adrena Hannah Beat, MD

## 2019-05-07 NOTE — Patient Instructions (Addendum)
labwork fasting prior to clinic appt  Follow up 1 month  Cardiology referral  Buspar -start twice a day and then increase to three times a day

## 2019-05-14 ENCOUNTER — Ambulatory Visit: Payer: BC Managed Care – PPO | Admitting: Family Medicine

## 2019-05-24 ENCOUNTER — Emergency Department (HOSPITAL_COMMUNITY)
Admission: EM | Admit: 2019-05-24 | Discharge: 2019-05-24 | Disposition: A | Discharge status: Home / Self Care | Payer: BC Managed Care – PPO | Attending: Emergency Medicine | Admitting: Emergency Medicine

## 2019-05-24 ENCOUNTER — Other Ambulatory Visit: Payer: Self-pay

## 2019-05-24 ENCOUNTER — Encounter (HOSPITAL_COMMUNITY): Payer: Self-pay | Admitting: Emergency Medicine

## 2019-05-24 DIAGNOSIS — Z79899 Other long term (current) drug therapy: Secondary | ICD-10-CM | POA: Diagnosis not present

## 2019-05-24 DIAGNOSIS — H6692 Otitis media, unspecified, left ear: Secondary | ICD-10-CM | POA: Insufficient documentation

## 2019-05-24 DIAGNOSIS — Z20828 Contact with and (suspected) exposure to other viral communicable diseases: Secondary | ICD-10-CM | POA: Diagnosis not present

## 2019-05-24 DIAGNOSIS — H6690 Otitis media, unspecified, unspecified ear: Secondary | ICD-10-CM

## 2019-05-24 DIAGNOSIS — B349 Viral infection, unspecified: Secondary | ICD-10-CM | POA: Diagnosis not present

## 2019-05-24 DIAGNOSIS — F17210 Nicotine dependence, cigarettes, uncomplicated: Secondary | ICD-10-CM | POA: Insufficient documentation

## 2019-05-24 DIAGNOSIS — J029 Acute pharyngitis, unspecified: Secondary | ICD-10-CM | POA: Diagnosis not present

## 2019-05-24 LAB — INFLUENZA PANEL BY PCR (TYPE A & B)
Influenza A By PCR: NEGATIVE
Influenza B By PCR: NEGATIVE

## 2019-05-24 LAB — GROUP A STREP BY PCR: Group A Strep by PCR: NOT DETECTED

## 2019-05-24 MED ORDER — PREDNISONE 50 MG PO TABS
60.0000 mg | ORAL_TABLET | Freq: Once | ORAL | Status: AC
  Administered 2019-05-24: 60 mg via ORAL
  Filled 2019-05-24: qty 1

## 2019-05-24 MED ORDER — DOXYCYCLINE HYCLATE 100 MG PO CAPS: 100.0000 mg | ORAL_CAPSULE | Freq: Two times a day (BID) | ORAL | 0 refills | Status: AC

## 2019-05-24 MED ORDER — ACETAMINOPHEN 500 MG PO TABS
1000.0000 mg | ORAL_TABLET | Freq: Once | ORAL | Status: AC
  Administered 2019-05-24: 1000 mg via ORAL
  Filled 2019-05-24: qty 2

## 2019-05-24 MED ORDER — LIDOCAINE VISCOUS HCL 2 % MT SOLN
15.0000 mL | Freq: Once | OROMUCOSAL | Status: AC
  Administered 2019-05-24: 15 mL via OROMUCOSAL
  Filled 2019-05-24: qty 15

## 2019-05-24 NOTE — Discharge Instructions (Addendum)
You likely have a viral illness.  This should be treated symptomatically. However, due to your signs of ear infection, will also treat with antibiotics.  Take the entire course, even if your symptoms improve. Use Tylenol or ibuprofen as needed for fevers, body aches, or sore throat. Use viscous lidocaine as needed for sore throat.  Use tessalon as needed for cough. Make sure you stay well-hydrated with water. Wash your hands frequently to prevent spread of infection. You are being tested for coronavirus.  If results are positive, you will receive a phone call.  If negative, you will not.  Either way, you may check online MyChart. Follow-up with your primary care doctor in 1 week if your symptoms are not improving. Return to the emergency room if you develop chest pain, difficulty breathing, or any new or worsening symptoms.

## 2019-05-24 NOTE — ED Triage Notes (Signed)
Pt reports sore throat, cough, body aches, sinus congestion, diarrhea since last night. nad noted. Pt denies any known fever.

## 2019-05-24 NOTE — ED Provider Notes (Signed)
[facility] EMERGENCY DEPARTMENT Provider Note   CSN: [id number] Arrival date & time: 05/24/19  0709     History   Chief Complaint Chief Complaint  Patient presents with  . Sore Throat    HPI Brenda Taylor is a 48 y.o. female presenting for evaluation of sore throat, nasal congestion, cough, body aches.  Patient states she felt acutely ill last night.  She reports nasal congestion rhinorrhea, sore throat, worse in the left side, left ear pain, body aches, and chills.  She reports mild worsening of cough, though she has a chronic cough due to smoking.  She denies known fevers.  She denies chest pain or shortness of breath.  She has associated nausea and diarrhea, no vomiting.  She has had 3 bowel movements today, nonbloody.  She denies abdominal pain.  No urinary symptoms.  She denies sick contacts or contact with known COVID-19 positive person.  She has a history of high blood pressure for which she takes medication, denies other medical problems. She has not had her flu shot this year.      HPI  Past Medical History:  Diagnosis Date  . Anxiety   . Chronic pain   . DDD (degenerative disc disease)   . HBP (high blood pressure)   . Hernia   . Ulcer disease     Patient Active Problem List   Diagnosis Date Noted  . Anxiety 05/07/2019  . Acute pain of left shoulder 04/29/2019  . Neck pain 04/29/2019  . Hypertension 04/23/2019  . Elevated glucose 04/23/2019  . Skin lesion of hand 04/23/2019  . HNP (herniated nucleus pulposus), lumbar 07/03/2013  . Unspecified constipation 10/31/2012  . Rotator cuff syndrome of left shoulder 10/09/2011  . ANEMIA 10/24/2009  . HEMOCCULT POSITIVE STOOL 10/24/2009  . ABDOMINAL PAIN, GENERALIZED 10/24/2009  . Arthralgia 05/28/2007  . HIGH BLOOD PRESSURE 05/23/2007    Past Surgical History:  Procedure Laterality Date  . APPENDECTOMY    . BACK SURGERY     x 4  . CARPAL TUNNEL RELEASE     bilateral  . CHOLECYSTECTOMY    . COLONOSCOPY   10/20/2009   [id number] anal canal/left-side diverticula and multiple polyps. poor prep compromised exam. Next TCS 09/2014.  Marland Kitchen ESOPHAGOGASTRODUODENOSCOPY  03/06/2010   [id number] quadrant distal esophageal/patent tubular esophagus/small HH, antral erosions and ulcerations. Bx negative for persistent H.pylori  . ESOPHAGOGASTRODUODENOSCOPY  10/20/2009   [id number] HH/prepyloric antral ulcer s/p bx (+h.pylori), ERE  . INTRAUTERINE DEVICE INSERTION    . MASTECTOMY, PARTIAL     x 2  . MOUTH SURGERY    . ROTATOR CUFF REPAIR     right  . TUBAL LIGATION       OB History    Gravida  2   Para  2   Term  2   Preterm      AB      Living        SAB      TAB      Ectopic      Multiple      Live Births               Home Medications    Prior to Admission medications   Medication Sig Start Date End Date Taking? Authorizing Provider  busPIRone (BUSPAR) 10 MG tablet Take 1 tablet (10 mg total) by mouth 3 (three) times daily. 05/07/19   Corum, Rex Kras, MD  cyclobenzaprine (FLEXERIL) 10 MG tablet Take 1 tablet (10  mg total) by mouth 2 (two) times daily as needed for muscle spasms. 04/28/19   Noemi Chapel, MD  doxycycline (VIBRAMYCIN) 100 MG capsule Take 1 capsule (100 mg total) by mouth 2 (two) times daily for 7 days. 05/24/19 05/31/19  Nam Vossler, PA-C  hydrochlorothiazide (HYDRODIURIL) 25 MG tablet Take 1 tablet (25 mg total) by mouth daily. 05/07/19   Corum, Rex Kras, MD  lisinopril (ZESTRIL) 20 MG tablet Take 1 tablet (20 mg total) by mouth daily. 04/23/19   Corum, Rex Kras, MD  naproxen (NAPROSYN) 500 MG tablet Take 1 tablet (500 mg total) by mouth 2 (two) times daily with a meal. 04/28/19   Noemi Chapel, MD    Family History Family History  Problem Relation Age of Onset  . Heart disease Other   . Arthritis Other   . Cancer Other   . Asthma Other   . Diabetes Other     Social History Social History   Tobacco Use  . Smoking status: Current Every Day Smoker     Packs/day: 0.50    Types: Cigarettes  . Smokeless tobacco: Never Used  Substance Use Topics  . Alcohol use: No  . Drug use: Yes    Frequency: 7.0 times per week    Types: Marijuana    Comment: Last use 09/07/17     Allergies   Amoxicillin and Aspirin   Review of Systems Review of Systems  Constitutional: Positive for chills.  HENT: Positive for congestion, ear pain, rhinorrhea and sore throat.   Respiratory: Positive for cough.   Gastrointestinal: Positive for diarrhea and nausea.  All other systems reviewed and are negative.    Physical Exam Updated Vital Signs BP 115/75 (BP Location: Left Arm)   Pulse 89   Temp 98.2 F (36.8 C) (Oral)   Resp 18   Ht 5\' 7"  (1.702 m)   Wt 109.3 kg   SpO2 97%   BMI 37.75 kg/m   Physical Exam Vitals signs and nursing note reviewed.  Constitutional:      General: She is not in acute distress.    Appearance: She is well-developed. She is ill-appearing. She is not toxic-appearing.     Comments: Ill appearing, but nontoxic  HENT:     Head: Normocephalic and atraumatic.     Right Ear: No middle ear effusion. Tympanic membrane is not erythematous or bulging.     Left Ear: A middle ear effusion is present. Tympanic membrane is bulging. Tympanic membrane is not erythematous.     Ears:     Comments: Effusion noted behind the left TM.  Bulging without erythema.    Nose: Rhinorrhea present. Rhinorrhea is clear.     Mouth/Throat:     Pharynx: Uvula midline. Oropharyngeal exudate and posterior oropharyngeal erythema present.     Tonsils: Tonsillar exudate present. 2+ on the right. 2+ on the left.     Comments: Erythema of the OP with exudate.  Uvula midline.  Handling secretions easily.  No muffled voice. Eyes:     Extraocular Movements: Extraocular movements intact.     Conjunctiva/sclera: Conjunctivae normal.     Pupils: Pupils are equal, round, and reactive to light.  Neck:     Musculoskeletal: Normal range of motion and neck supple.   Cardiovascular:     Rate and Rhythm: Regular rhythm. Tachycardia present.     Pulses: Normal pulses.     Comments: Mildly tachycardic around 105 Pulmonary:     Effort: Pulmonary effort is normal. No respiratory  distress.     Breath sounds: Normal breath sounds. No wheezing.     Comments: Speaking in full sentences.  Clear lung sounds in all fields. Abdominal:     General: There is no distension.     Palpations: Abdomen is soft. There is no mass.     Tenderness: There is no abdominal tenderness. There is no guarding or rebound.  Musculoskeletal: Normal range of motion.     Right lower leg: No edema.     Left lower leg: No edema.  Skin:    General: Skin is warm and dry.     Capillary Refill: Capillary refill takes less than 2 seconds.  Neurological:     Mental Status: She is alert and oriented to person, place, and time.      ED Treatments / Results  Labs (all labs ordered are listed, but only abnormal results are displayed) Labs Reviewed  GROUP A STREP BY PCR  NOVEL CORONAVIRUS, NAA (HOSP ORDER, SEND-OUT TO REF LAB; TAT 18-24 HRS)  INFLUENZA PANEL BY PCR (TYPE A & B)    EKG None  Radiology No results found.  Procedures Procedures (including critical care time)  Medications Ordered in ED Medications  lidocaine (XYLOCAINE) 2 % viscous mouth solution 15 mL (15 mLs Mouth/Throat Given 05/24/19 1020)  acetaminophen (TYLENOL) tablet 1,000 mg (1,000 mg Oral Given 05/24/19 1019)  predniSONE (DELTASONE) tablet 60 mg (60 mg Oral Given 05/24/19 1018)     Initial Impression / Assessment and Plan / ED Course  I have reviewed the triage vital signs and the nursing notes.  Pertinent labs & imaging results that were available during my care of the patient were reviewed by me and considered in my medical decision making (see chart for details).        Patient presenting for evaluation of sore throat, chills, cough, body aches.  Physical exam shows patient who appears ill but  nontoxic.  Exudate, erythema, and tonsillar swelling noted on oral exam.  Left AOM noted.  Clear lung sounds.  Consider strep, although patient does have associated nasal congestion and body aches which are less common.  No muffled voice, trismus, or difficulty handling secretions.  Uvula midline.  No sign of PTA at this time.  Consider flu due to rapid onset of viral-like symptoms.  Consider Covid.  Will treat with viscous lidocaine, prednisone, and Tylenol and reassess.  Strep test negative.  Flu test negative.  Covid pending.  On reassessment, heart rate is improved.  Patient reports improvement of symptoms.  Discussed supportive treatment at home for potential viral illness.  However as patient has AOM of the left ear, will treat with antibiotics, although this may also be viral.  Discussed results for Covid are pending.  At this time, patient appears safe for discharge.  Return precautions given.  Patient states she understands and agrees to plan.   Final Clinical Impressions(s) / ED Diagnoses   Final diagnoses:  Viral illness  Acute otitis media, unspecified otitis media type    ED Discharge Orders         Ordered    doxycycline (VIBRAMYCIN) 100 MG capsule  2 times daily     05/24/19 [place], Eulala Newcombe, PA-C 05/24/19 1423    Nat Christen, MD 05/25/19 1020

## 2019-05-25 LAB — NOVEL CORONAVIRUS, NAA (HOSP ORDER, SEND-OUT TO REF LAB; TAT 18-24 HRS): SARS-CoV-2, NAA: NOT DETECTED

## 2019-06-04 ENCOUNTER — Ambulatory Visit (INDEPENDENT_AMBULATORY_CARE_PROVIDER_SITE_OTHER): Payer: BC Managed Care – PPO | Admitting: Otolaryngology

## 2019-06-08 ENCOUNTER — Ambulatory Visit: Payer: BC Managed Care – PPO | Admitting: Family Medicine

## 2019-06-11 ENCOUNTER — Encounter: Payer: Self-pay | Admitting: Cardiology

## 2019-06-11 ENCOUNTER — Ambulatory Visit (INDEPENDENT_AMBULATORY_CARE_PROVIDER_SITE_OTHER): Payer: BC Managed Care – PPO | Admitting: Cardiology

## 2019-06-11 ENCOUNTER — Telehealth: Payer: Self-pay | Admitting: Cardiology

## 2019-06-11 ENCOUNTER — Other Ambulatory Visit: Payer: Self-pay

## 2019-06-11 VITALS — BP 96/60 | HR 84 | Temp 95.5°F | Ht 68.0 in | Wt 242.0 lb

## 2019-06-11 DIAGNOSIS — I10 Essential (primary) hypertension: Secondary | ICD-10-CM | POA: Diagnosis not present

## 2019-06-11 DIAGNOSIS — R002 Palpitations: Secondary | ICD-10-CM

## 2019-06-11 NOTE — Progress Notes (Signed)
Clinical Summary Brenda Taylor is a 47 y.o.female seen as new consult, referred by Dr Brenda Taylor for palpitations.   1. Palpitations - can have feeling of irregular. 1-2 times per week. Can occur at rest or with activity. Can feel diaphoretic - lasts just a few seconds. - coffee x 1 cup per week, no tea, diet sodas 1 per day, few energy drinks a week, no EtOH - history of severe anxiety   Past Medical History:  Diagnosis Date  . Anxiety   . Chronic pain   . DDD (degenerative disc disease)   . HBP (high blood pressure)   . Hernia   . Ulcer disease      Allergies  Allergen Reactions  . Amoxicillin     Reaction is unknown  . Aspirin     Cannot take due to ulcers     Current Outpatient Medications  Medication Sig Dispense Refill  . busPIRone (BUSPAR) 10 MG tablet Take 1 tablet (10 mg total) by mouth 3 (three) times daily. 90 tablet 1  . cyclobenzaprine (FLEXERIL) 10 MG tablet Take 1 tablet (10 mg total) by mouth 2 (two) times daily as needed for muscle spasms. 20 tablet 0  . hydrochlorothiazide (HYDRODIURIL) 25 MG tablet Take 1 tablet (25 mg total) by mouth daily. 30 tablet 1  . lisinopril (ZESTRIL) 20 MG tablet Take 1 tablet (20 mg total) by mouth daily. 90 tablet 1  . naproxen (NAPROSYN) 500 MG tablet Take 1 tablet (500 mg total) by mouth 2 (two) times daily with a meal. 30 tablet 0   No current facility-administered medications for this visit.      Past Surgical History:  Procedure Laterality Date  . APPENDECTOMY    . BACK SURGERY     x 4  . CARPAL TUNNEL RELEASE     bilateral  . CHOLECYSTECTOMY    . COLONOSCOPY  10/20/2009   [id number] anal canal/left-side diverticula and multiple polyps. poor prep compromised exam. Next TCS 09/2014.  Marland Kitchen ESOPHAGOGASTRODUODENOSCOPY  03/06/2010   [id number] quadrant distal esophageal/patent tubular esophagus/small HH, antral erosions and ulcerations. Bx negative for persistent H.pylori  . ESOPHAGOGASTRODUODENOSCOPY  10/20/2009   [id number] HH/prepyloric antral ulcer s/p bx (+h.pylori), ERE  . INTRAUTERINE DEVICE INSERTION    . MASTECTOMY, PARTIAL     x 2  . MOUTH SURGERY    . ROTATOR CUFF REPAIR     right  . TUBAL LIGATION       Allergies  Allergen Reactions  . Amoxicillin     Reaction is unknown  . Aspirin     Cannot take due to ulcers      Family History  Problem Relation Age of Onset  . Heart disease Other   . Arthritis Other   . Cancer Other   . Asthma Other   . Diabetes Other      Social History Brenda Taylor reports that she has been smoking cigarettes. She has been smoking about 0.50 packs per day. She has never used smokeless tobacco. Brenda Taylor reports no history of alcohol use.   Review of Systems CONSTITUTIONAL: No weight loss, fever, chills, weakness or fatigue.  HEENT: Eyes: No visual loss, blurred vision, double vision or yellow sclerae.No hearing loss, sneezing, congestion, runny nose or sore throat.  SKIN: No rash or itching.  CARDIOVASCULAR: per hpi RESPIRATORY: No shortness of breath, cough or sputum.  GASTROINTESTINAL: No anorexia, nausea, vomiting or diarrhea. No abdominal pain or blood.  GENITOURINARY: No burning on urination,  no polyuria NEUROLOGICAL: No headache, dizziness, syncope, paralysis, ataxia, numbness or tingling in the extremities. No change in bowel or bladder control.  MUSCULOSKELETAL: No muscle, back pain, joint pain or stiffness.  LYMPHATICS: No enlarged nodes. No history of splenectomy.  PSYCHIATRIC: No history of depression or anxiety.  ENDOCRINOLOGIC: No reports of sweating, cold or heat intolerance. No polyuria or polydipsia.  Marland Kitchen   Physical Examination Today's Vitals   06/11/19 1434 06/11/19 1438  BP: (!) 95/57 96/60  Pulse: 83 84  Temp: (!) 95.5 F (35.3 C)   SpO2: 98%   Weight: 242 lb (109.8 kg)   Height: 5\' 8"  (1.727 m)    Body mass index is 36.8 kg/m.  Gen: resting comfortably, no acute distress HEENT: no scleral icterus, pupils equal  round and reactive, no palptable cervical adenopathy,  CV: RRR, no m/r/g no jvd Resp: Clear to auscultation bilaterally GI: abdomen is soft, non-tender, non-distended, normal bowel sounds, no hepatosplenomegaly MSK: extremities are warm, no edema.  Skin: warm, no rash Neuro:  no focal deficits Psych: appropriate affect     Assessment and Plan  1. Palpitations - we will obtain a event monitor to evaluate for any significant arrhythmias as the cause of her symptoms, plan for 14 days - EKG today shows NSR      Brenda Taylor, M.D

## 2019-06-11 NOTE — Telephone Encounter (Signed)

## 2019-06-11 NOTE — Patient Instructions (Signed)
Medication Instructions:  Your physician recommends that you continue on your current medications as directed. Please refer to the Current Medication list given to you today.   Labwork: NONE  Testing/Procedures: Your physician has recommended that you wear an event monitor. Event monitors are medical devices that record the heart's electrical activity. Doctors most often [place] these monitors to diagnose arrhythmias. Arrhythmias are problems with the speed or rhythm of the heartbeat. The monitor is a small, portable device. You can wear one while you do your normal daily activities. This is usually used to diagnose what is causing palpitations/syncope (passing out). 44 DAY   Follow-Up: Your physician recommends that you schedule a follow-up appointment in: 6 WEEKS    Any Other Special Instructions Will Be Listed Below (If Applicable).     If you need a refill on your cardiac medications before your next appointment, please call your pharmacy.

## 2019-06-15 ENCOUNTER — Encounter: Payer: Self-pay | Admitting: Family Medicine

## 2019-06-15 ENCOUNTER — Other Ambulatory Visit: Payer: Self-pay

## 2019-06-15 ENCOUNTER — Telehealth (INDEPENDENT_AMBULATORY_CARE_PROVIDER_SITE_OTHER): Payer: BC Managed Care – PPO | Admitting: Family Medicine

## 2019-06-15 DIAGNOSIS — F419 Anxiety disorder, unspecified: Secondary | ICD-10-CM

## 2019-06-15 DIAGNOSIS — I10 Essential (primary) hypertension: Secondary | ICD-10-CM | POA: Diagnosis not present

## 2019-06-15 MED ORDER — BUSPIRONE HCL 15 MG PO TABS: 15.0000 mg | ORAL_TABLET | Freq: Three times a day (TID) | ORAL | 1 refills | Status: DC
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
## 2019-06-16 ENCOUNTER — Telehealth: Payer: Self-pay | Admitting: Cardiology

## 2019-06-16 NOTE — Telephone Encounter (Signed)
Patient states that she has misplaced the monitor box. I will contact Mitch to have another mail to her home.

## 2019-06-16 NOTE — Telephone Encounter (Signed)
Pt is needing more strips for her monitor

## 2019-06-16 NOTE — Telephone Encounter (Signed)
Coming to office

## 2019-06-26 ENCOUNTER — Telehealth: Payer: Self-pay | Admitting: Family Medicine

## 2019-06-26 NOTE — Telephone Encounter (Signed)
Patient is calling and states her daughter Janvi Munafo was in the office and was sent to get another covid test, which came back negative. The patient employer is requesting since she was at the hospital with her daughter at the time of her surgery to have a letter stating she is cleared to return to work. Patient states they are not requiring a negative covid test, but requiring a letter from her doctor office stating she can return to work. Informed pt Dr. Holly Bodily was out of office today.

## 2019-06-27 NOTE — Telephone Encounter (Signed)
We can not complete a return to work note if pt does not have a negative test.  Pt can schedule an appointment for a test at her convenience. Please let her know hours of testing center.

## 2019-06-29 ENCOUNTER — Encounter: Payer: Self-pay | Admitting: Family Medicine

## 2019-07-15 ENCOUNTER — Ambulatory Visit (INDEPENDENT_AMBULATORY_CARE_PROVIDER_SITE_OTHER): Payer: BC Managed Care – PPO

## 2019-07-15 ENCOUNTER — Other Ambulatory Visit: Payer: Self-pay

## 2019-07-15 DIAGNOSIS — R002 Palpitations: Secondary | ICD-10-CM

## 2019-07-29 ENCOUNTER — Telehealth (INDEPENDENT_AMBULATORY_CARE_PROVIDER_SITE_OTHER): Payer: BC Managed Care – PPO | Admitting: Family Medicine

## 2019-07-29 ENCOUNTER — Encounter: Payer: Self-pay | Admitting: Family Medicine

## 2019-07-29 VITALS — Ht 68.0 in | Wt 241.0 lb

## 2019-07-29 DIAGNOSIS — R0600 Dyspnea, unspecified: Secondary | ICD-10-CM

## 2019-07-29 DIAGNOSIS — R0609 Other forms of dyspnea: Secondary | ICD-10-CM

## 2019-07-29 DIAGNOSIS — R002 Palpitations: Secondary | ICD-10-CM | POA: Diagnosis not present

## 2019-07-29 DIAGNOSIS — I10 Essential (primary) hypertension: Secondary | ICD-10-CM | POA: Diagnosis not present

## 2019-07-29 MED ORDER — LISINOPRIL 40 MG PO TABS: 40.0000 mg | ORAL_TABLET | Freq: Every day | ORAL | 3 refills | Status: DC

## 2019-07-29 NOTE — Progress Notes (Addendum)
Virtual Visit via Telephone Note   This visit type was conducted due to national recommendations for restrictions regarding the COVID-19 Pandemic (e.g. social distancing) in an effort to limit this patient's exposure and mitigate transmission in our community.  Due to her co-morbid illnesses, this patient is at least at moderate risk for complications without adequate follow up.  This format is felt to be most appropriate for this patient at this time.  The patient did not have access to video technology/had technical difficulties with video requiring transitioning to audio format only (telephone).  All issues noted in this document were discussed and addressed.  No physical exam could be performed with this format.  Please refer to the patient's chart for her  consent to telehealth for [facility].   Date:  07/29/2019   ID:  Brenda Taylor, DOB Nov 27, 1970, MRN [id number]  Patient Location: Home Provider Location: Office  PCP:  Maryruth Hancock, MD  Cardiologist:  Carlyle Dolly, MD  Electrophysiologist:  None   Evaluation Performed:  Follow-Up Visit  Chief Complaint: Follow-up  History of Present Illness:    Brenda Taylor is a 49 y.o. female seen in June 11, 2019 for complaints of palpitations.  History of hypertension, chronic pain, anxiety, hernia, ulcers.  At that visit patient had been complaining of feeling of irregular heart rhythm 1-2 times a week with or without activity with associated diaphoresis.  She was consuming at least 1 cup of coffee per week and diet soda release 1/day along with a few energy drinks per week.  She denied any alcohol use.  She admitted to having history of severe anxiety.  EKG on that visit showed normal sinus rhythm.  Event monitor showed primarily sinus rhythm ranging from sinus bradycardia to sinus tachycardia.  No evidence is SVT, atrial fibrillation, pauses, ventricular tachycardia, heart blocks. Did have some PVCs.  Admits to some increasing  dyspnea on exertion and the feeling as if she is "carrying something extra when walking" causing extra fatigue.  She denies any chest pain, pressure, tightness, neck, back, arm, or jaw pain with exertion.  She also admits to some leg soreness and legs feeling heavy when ambulating. She has had 5 back surgeries in the past and has issues with sciatica which could be contributing to some of the leg heaviness per her statement.  Patient states she snores a significant amount and only sleeps 2 to 3 hours per night.  Admits to some excessive daytime sleepiness.  States sleep apnea runs in her family and she may need to be checked for this in the future.  She continues to smoke at least 1 pack/day and has done so for 40 years.  The patient does not have symptoms concerning for COVID-19 infection (fever, chills, cough, or new shortness of breath).    Past Medical History:  Diagnosis Date  . Anxiety   . Chronic pain   . DDD (degenerative disc disease)   . HBP (high blood pressure)   . Hernia   . Ulcer disease    Past Surgical History:  Procedure Laterality Date  . APPENDECTOMY    . BACK SURGERY     x 4  . CARPAL TUNNEL RELEASE     bilateral  . CHOLECYSTECTOMY    . COLONOSCOPY  10/20/2009   [id number] anal canal/left-side diverticula and multiple polyps. poor prep compromised exam. Next TCS 09/2014.  Marland Kitchen ESOPHAGOGASTRODUODENOSCOPY  03/06/2010   [id number] quadrant distal esophageal/patent tubular esophagus/small HH, antral erosions and  ulcerations. Bx negative for persistent H.pylori  . ESOPHAGOGASTRODUODENOSCOPY  10/20/2009   [id number] HH/prepyloric antral ulcer s/p bx (+h.pylori), ERE  . INTRAUTERINE DEVICE INSERTION    . MASTECTOMY, PARTIAL     x 2  . MOUTH SURGERY    . ROTATOR CUFF REPAIR     right  . TUBAL LIGATION       Current Meds  Medication Sig  . busPIRone (BUSPAR) 15 MG tablet Take 1 tablet (15 mg total) by mouth 3 (three) times daily.  . hydrochlorothiazide (HYDRODIURIL) 25  MG tablet Take 1 tablet (25 mg total) by mouth daily.  Marland Kitchen lisinopril (ZESTRIL) 20 MG tablet Take 1 tablet (20 mg total) by mouth daily.     Allergies:   Amoxicillin and Aspirin   Social History   Tobacco Use  . Smoking status: Current Every Day Smoker    Packs/day: 0.50    Types: Cigarettes  . Smokeless tobacco: Never Used  Substance Use Topics  . Alcohol use: No  . Drug use: Yes    Frequency: 7.0 times per week    Types: Marijuana    Comment: Last use 09/07/17     Family Hx: The patient's family history includes Arthritis in an other family member; Asthma in an other family member; Cancer in an other family member; Diabetes in an other family member; Heart disease in an other family member.  ROS:   Please see the history of present illness.    All other systems reviewed and are negative.   Prior CV studies:   The following studies were reviewed today:  Holter monitor report started on July 15, 2019 The predominant rhythm was Sinus bradycardia to sinus tachycardia with sinus arrhythmia. *The Maximum Heart Rate recorded was 152 bpm, Day 9 / 08:42:12 pm, the Minimum Heart Rate recorded was 53 bpm, Day 14 / 02:18:20 am and the Average Heart Rate was 94 bpm. *There were 127 PVCs with a Burden of < 0.01 %. There was 1 occurrence of Ventricular Tachycardia with the longest episode 11 beats, Day 13 / 11:51:23 pm and the fastest episode 127 bpm, Day 13 / 11:51:23 pm. *There were 543 PSVCs with a burden of 0.04 %. There were 14 occurrences of Supraventricular Tachycardia with the Longest episode 42 beats, Day 2 / 07:00:59 am and the fastest episode 152 bpm, Day 9 / 08:42:16 pm. *Diary events were entered. There were 16 Patient triggered events.  Labs/Other Tests and Data Reviewed:    EKG:  An ECG dated June 11, 2019 was personally reviewed today and demonstrated:  Normal sinus rhythm 89  Recent Labs: 04/24/2019: ALT 11; BUN 15; Creat 0.55; Hemoglobin 11.9; Platelets 292;  Potassium 4.1; Sodium 140; TSH 2.42   Recent Lipid Panel No results found for: CHOL, TRIG, HDL, CHOLHDL, LDLCALC, LDLDIRECT  Wt Readings from Last 3 Encounters:  07/29/19 241 lb (109.3 kg)  06/11/19 242 lb (109.8 kg)  05/24/19 241 lb (109.3 kg)     Objective:    Vital Signs:  Ht 5\' 8"  (1.727 m)   Wt 241 lb (109.3 kg)   BMI 36.64 kg/m      ASSESSMENT & PLAN:    1. Essential hypertension States blood pressure has been elevated recently both at PCP office and at home.  Increase lisinopril dosage to 20 mg.  Get BMP in 2 weeks after starting medication.  2. Palpitations Admits to rapid heart rates when exerting but no sensation of fluttering, skipped beats.  Denies any orthostatic symptoms/syncopal/near  syncopal episode.  Recent event monitor showed no significant tachy or brady arrhythmias, VT or SVT, atrial fibrillation, heart blocks, or pauses.  Experienced some PVCs.  Patient admits to excessive snoring and only 2 to 3 hours of sleep each night with excessive daytime sleepiness and fatigue.  May need a sleep study in the future to check for OSA.  History of OSA in her family.  3. DOE (dyspnea on exertion) Admits to increased dyspnea on exertion when walking and associated exertional fatigue.  States she has a family history of cardiomyopathy in mother who died at the age of 29.  Get an echocardiogram.  COVID-19 Education: The signs and symptoms of COVID-19 were discussed with the patient and how to seek care for testing (follow up with PCP or arrange E-visit).  The importance of social distancing was discussed today.  Time:   Today, I have spent 15 minutes with the patient with telehealth technology discussing the above problems.     Medication Adjustments/Labs and Tests Ordered: Current medicines are reviewed at length with the patient today.  Concerns regarding medicines are outlined above.   Tests Ordered: No orders of the defined types were placed in this  encounter.   Medication Changes: No orders of the defined types were placed in this encounter.   Follow Up:  Either In Person or Virtual in 4 month(s)  Signed, Verta Ellen, NP  07/29/2019 2:36 PM    [place]

## 2019-07-29 NOTE — Patient Instructions (Addendum)
Medication Instructions:   Increase Lisinopril to 40mg  daily - new prescription sent to pharmacy today.  Continue all other medications.    Labwork:  BMET - order enclosed.   Please do in approximately 2 weeks - around 08/12/2019.  You may do this lab at [facility] - enter through short stay entrance.  Testing/Procedures: Your physician has requested that you have an echocardiogram. Echocardiography is a painless test that uses sound waves to create images of your heart. It provides your doctor with information about the size and shape of your heart and how well your heart's chambers and valves are working. This procedure takes approximately one hour. There are no restrictions for this procedure.  Follow-Up:  Office will contact with results via phone or letter.    4 months   Any Other Special Instructions Will Be Listed Below (If Applicable).  If you need a refill on your cardiac medications before your next appointment, please call your pharmacy.

## 2019-07-29 NOTE — Addendum Note (Signed)
Addended by: Laurine Blazer on: 07/29/2019 04:55 PM   Modules accepted: Orders

## 2019-07-31 ENCOUNTER — Other Ambulatory Visit: Payer: Self-pay | Admitting: *Deleted

## 2019-07-31 DIAGNOSIS — R0600 Dyspnea, unspecified: Secondary | ICD-10-CM

## 2019-07-31 DIAGNOSIS — I10 Essential (primary) hypertension: Secondary | ICD-10-CM

## 2019-07-31 DIAGNOSIS — R0609 Other forms of dyspnea: Secondary | ICD-10-CM

## 2019-08-06 ENCOUNTER — Other Ambulatory Visit: Payer: Self-pay

## 2019-08-06 ENCOUNTER — Encounter: Payer: Self-pay | Admitting: Family Medicine

## 2019-08-06 ENCOUNTER — Ambulatory Visit (INDEPENDENT_AMBULATORY_CARE_PROVIDER_SITE_OTHER): Payer: BC Managed Care – PPO | Admitting: Family Medicine

## 2019-08-06 VITALS — BP 119/70 | HR 95 | Temp 98.8°F | Ht 68.0 in | Wt 241.0 lb

## 2019-08-06 DIAGNOSIS — R202 Paresthesia of skin: Secondary | ICD-10-CM

## 2019-08-06 DIAGNOSIS — I10 Essential (primary) hypertension: Secondary | ICD-10-CM | POA: Diagnosis not present

## 2019-08-06 DIAGNOSIS — B379 Candidiasis, unspecified: Secondary | ICD-10-CM

## 2019-08-06 DIAGNOSIS — R7309 Other abnormal glucose: Secondary | ICD-10-CM | POA: Diagnosis not present

## 2019-08-06 DIAGNOSIS — R200 Anesthesia of skin: Secondary | ICD-10-CM

## 2019-08-06 LAB — POCT GLYCOSYLATED HEMOGLOBIN (HGB A1C): Hemoglobin A1C: 5.7 % — AB (ref 4.0–5.6)

## 2019-08-06 MED ORDER — CLOTRIMAZOLE 1 % EX CREA: 1.0000 "application " | TOPICAL_CREAM | Freq: Two times a day (BID) | CUTANEOUS | 1 refills | Status: DC

## 2019-08-06 MED ORDER — FLUCONAZOLE 150 MG PO TABS: ORAL_TABLET | ORAL | 0 refills | Status: DC

## 2019-08-06 NOTE — Progress Notes (Signed)
Acute Office Visit  Subjective:    Patient ID: Brenda Taylor, female    DOB: 1971/07/06, 49 y.o.   MRN: [id number]  Chief Complaint  Patient presents with  . Rash    under the breast/around the outside of the groin area   . Foot Pain    buring/hurting  . Hand Pain    burning / hurting    HPI Patient is in today for burning sensation in the feet and hands-no recent B12 level. Pt states increase stress as she is raising grandchildren and her daughter who has mental illness caused her to file charges due to disruption in her home. Pt has legal custody of grandchildren. Works early shift at [employer].  Taking Buspar TID.  Blood pressure in control with medication-HCTZ/lisinopril. Concern for yeast in groin and under breast. Pt states hot in kitchen at work.  Past Medical History:  Diagnosis Date  . Anxiety   . Chronic pain   . DDD (degenerative disc disease)   . HBP (high blood pressure)   . Hernia   . Ulcer disease     Past Surgical History:  Procedure Laterality Date  . APPENDECTOMY    . BACK SURGERY     x 4  . CARPAL TUNNEL RELEASE     bilateral  . CHOLECYSTECTOMY    . COLONOSCOPY  10/20/2009   [id number] anal canal/left-side diverticula and multiple polyps. poor prep compromised exam. Next TCS 09/2014.  Marland Kitchen ESOPHAGOGASTRODUODENOSCOPY  03/06/2010   [id number] quadrant distal esophageal/patent tubular esophagus/small HH, antral erosions and ulcerations. Bx negative for persistent H.pylori  . ESOPHAGOGASTRODUODENOSCOPY  10/20/2009   [id number] HH/prepyloric antral ulcer s/p bx (+h.pylori), ERE  . INTRAUTERINE DEVICE INSERTION    . MASTECTOMY, PARTIAL     x 2  . MOUTH SURGERY    . ROTATOR CUFF REPAIR     right  . TUBAL LIGATION      Family History  Problem Relation Age of Onset  . Heart disease Other   . Arthritis Other   . Cancer Other   . Asthma Other   . Diabetes Other     Social History   Socioeconomic History  . Marital status: Widowed    Spouse  name: Not on file  . Number of children: Not on file  . Years of education: 64  . Highest education level: Not on file  Occupational History  . Occupation: Scientist, water quality  Tobacco Use  . Smoking status: Current Every Day Smoker    Packs/day: 0.50    Types: Cigarettes  . Smokeless tobacco: Never Used  Substance and Sexual Activity  . Alcohol use: No  . Drug use: Yes    Frequency: 7.0 times per week    Types: Marijuana    Comment: Last use 09/07/17  . Sexual activity: Yes    Birth control/protection: I.U.D.  Other Topics Concern  . Not on file  Social History Narrative  . Not on file   Social Determinants of Health   Financial Resource Strain:   . Difficulty of Paying Living Expenses: Not on file  Food Insecurity:   . Worried About Charity fundraiser in the Last Year: Not on file  . Ran Out of Food in the Last Year: Not on file  Transportation Needs:   . Lack of Transportation (Medical): Not on file  . Lack of Transportation (Non-Medical): Not on file  Physical Activity:   . Days of Exercise per Week: Not on file  .  Minutes of Exercise per Session: Not on file  Stress:   . Feeling of Stress : Not on file  Social Connections:   . Frequency of Communication with Friends and Family: Not on file  . Frequency of Social Gatherings with Friends and Family: Not on file  . Attends Religious Services: Not on file  . Active Member of Clubs or Organizations: Not on file  . Attends Archivist Meetings: Not on file  . Marital Status: Not on file  Intimate Partner Violence:   . Fear of Current or Ex-Partner: Not on file  . Emotionally Abused: Not on file  . Physically Abused: Not on file  . Sexually Abused: Not on file    Outpatient Medications Prior to Visit  Medication Sig Dispense Refill  . busPIRone (BUSPAR) 15 MG tablet Take 1 tablet (15 mg total) by mouth 3 (three) times daily. 90 tablet 1  . hydrochlorothiazide (HYDRODIURIL) 25 MG tablet Take 1 tablet (25 mg total) by  mouth daily. 30 tablet 1  . lisinopril (ZESTRIL) 40 MG tablet Take 1 tablet (40 mg total) by mouth daily. 90 tablet 3   No facility-administered medications prior to visit.    Allergies  Allergen Reactions  . Amoxicillin     Reaction is unknown  . Aspirin     Cannot take due to ulcers    Review of Systems  Skin: Positive for rash.  Neurological: Positive for numbness. Negative for dizziness, tremors, syncope, facial asymmetry, weakness, light-headedness and headaches.       Objective:    Physical Exam Constitutional:      Appearance: Normal appearance.  HENT:     Head: Normocephalic and atraumatic.  Cardiovascular:     Rate and Rhythm: Normal rate and regular rhythm.     Pulses: Normal pulses.     Heart sounds: Normal heart sounds.  Pulmonary:     Effort: Pulmonary effort is normal.     Breath sounds: Normal breath sounds.  Musculoskeletal:     Cervical back: Normal range of motion and neck supple.  Skin:    Findings: Erythema and rash present.     Comments: Inferior to breast bilat  Neurological:     Mental Status: She is alert and oriented to person, place, and time.     Sensory: No sensory deficit.     Motor: No weakness.     Coordination: Coordination normal.     Gait: Gait normal.  Psychiatric:        Mood and Affect: Mood normal.        Behavior: Behavior normal.     BP 119/70 (BP Location: Left Arm, Patient Position: Sitting, Cuff Size: Normal)   Pulse 95   Temp 98.8 F (37.1 C) (Temporal)   Ht 5\' 8"  (1.727 m)   Wt 241 lb (109.3 kg)   SpO2 97%   BMI 36.64 kg/m  Wt Readings from Last 3 Encounters:  08/06/19 241 lb (109.3 kg)  07/29/19 241 lb (109.3 kg)  06/11/19 242 lb (109.8 kg)    Health Maintenance Due  Topic Date Due  . HIV Screening  09/22/1983  . PAP SMEAR-Modifier  09/21/1989  . MAMMOGRAM  09/22/2018  . COLONOSCOPY  09/22/2018  . INFLUENZA VACCINE  02/21/2019   Lab Results  Component Value Date   TSH 2.42 04/24/2019   Lab  Results  Component Value Date   WBC 10.1 04/24/2019   HGB 11.9 04/24/2019   HCT 35.9 04/24/2019   MCV 88.6  04/24/2019   PLT 292 04/24/2019   Lab Results  Component Value Date   NA 140 04/24/2019   K 4.1 04/24/2019   CO2 25 04/24/2019   GLUCOSE 103 (H) 04/24/2019   BUN 15 04/24/2019   CREATININE 0.55 04/24/2019   BILITOT 0.3 04/24/2019   ALKPHOS 77 01/17/2018   AST 15 04/24/2019   ALT 11 04/24/2019   PROT 6.9 04/24/2019   ALBUMIN 3.7 01/17/2018   CALCIUM 9.0 04/24/2019   ANIONGAP 7 01/17/2018   Lab Results  Component Value Date   HGBA1C 5.5 05/07/2019       Assessment & Plan:  1. Hypertension, unspecified type Stable-lisinopril/HCTZ - Lipid Panel w/o Chol/HDL Ratio - Basic metabolic panel  2. Numbness and tingling in both hands - Vitamin B12-neuro referral if normal 3. Elevated glucose 5.7% -watch carbohydrates 4. Candidiasis Diflucan-rx. Lotrimin-rx Keep area dry    Mihaela Hannah Beat, MD

## 2019-08-06 NOTE — Patient Instructions (Signed)
Blood work-fasting Keep appointment with cardiology

## 2019-08-12 ENCOUNTER — Other Ambulatory Visit: Payer: BC Managed Care – PPO

## 2019-08-13 ENCOUNTER — Ambulatory Visit (INDEPENDENT_AMBULATORY_CARE_PROVIDER_SITE_OTHER): Payer: BC Managed Care – PPO

## 2019-08-13 ENCOUNTER — Other Ambulatory Visit: Payer: Self-pay

## 2019-08-13 DIAGNOSIS — R0609 Other forms of dyspnea: Secondary | ICD-10-CM

## 2019-08-13 DIAGNOSIS — R0600 Dyspnea, unspecified: Secondary | ICD-10-CM | POA: Diagnosis not present

## 2019-08-26 ENCOUNTER — Telehealth: Payer: Self-pay | Admitting: *Deleted

## 2019-08-26 NOTE — Telephone Encounter (Signed)
-----   Message from Arnoldo Lenis, MD sent at 08/24/2019  2:09 PM EST ----- Normal echo   Zandra Abts MD

## 2019-08-26 NOTE — Telephone Encounter (Signed)
Pt aware - routed to pcp  

## 2019-11-27 ENCOUNTER — Ambulatory Visit: Payer: BC Managed Care – PPO | Admitting: Cardiology

## 2019-11-27 NOTE — Progress Notes (Deleted)
Clinical Summary Ms. Kaspar is a 49 y.o.female  1. Palpitations - can have feeling of irregular. 1-2 times per week. Can occur at rest or with activity. Can feel diaphoretic - lasts just a few seconds. - coffee x 1 cup per week, no tea, diet sodas 1 per day, few energy drinks a week, no EtOH - history of severe anxiety   Jan 2021 Event monitor: PACs, PVCs. Some runs of atach up to 140 bpm   2. DOE - recent normal echo   Past Medical History:  Diagnosis Date  . Anxiety   . Chronic pain   . DDD (degenerative disc disease)   . HBP (high blood pressure)   . Hernia   . Ulcer disease      Allergies  Allergen Reactions  . Amoxicillin     Reaction is unknown  . Aspirin     Cannot take due to ulcers     Current Outpatient Medications  Medication Sig Dispense Refill  . busPIRone (BUSPAR) 15 MG tablet Take 1 tablet (15 mg total) by mouth 3 (three) times daily. 90 tablet 1  . clotrimazole (LOTRIMIN) 1 % cream Apply 1 application topically 2 (two) times daily. 60 g 1  . fluconazole (DIFLUCAN) 150 MG tablet Take one po today then one po in 1 week 2 tablet 0  . hydrochlorothiazide (HYDRODIURIL) 25 MG tablet Take 1 tablet (25 mg total) by mouth daily. 30 tablet 1  . lisinopril (ZESTRIL) 40 MG tablet Take 1 tablet (40 mg total) by mouth daily. 90 tablet 3   No current facility-administered medications for this visit.     Past Surgical History:  Procedure Laterality Date  . APPENDECTOMY    . BACK SURGERY     x 4  . CARPAL TUNNEL RELEASE     bilateral  . CHOLECYSTECTOMY    . COLONOSCOPY  10/20/2009   [id number] anal canal/left-side diverticula and multiple polyps. poor prep compromised exam. Next TCS 09/2014.  Marland Kitchen ESOPHAGOGASTRODUODENOSCOPY  03/06/2010   [id number] quadrant distal esophageal/patent tubular esophagus/small HH, antral erosions and ulcerations. Bx negative for persistent H.pylori  . ESOPHAGOGASTRODUODENOSCOPY  10/20/2009   [id number] HH/prepyloric antral ulcer  s/p bx (+h.pylori), ERE  . INTRAUTERINE DEVICE INSERTION    . MASTECTOMY, PARTIAL     x 2  . MOUTH SURGERY    . ROTATOR CUFF REPAIR     right  . TUBAL LIGATION       Allergies  Allergen Reactions  . Amoxicillin     Reaction is unknown  . Aspirin     Cannot take due to ulcers      Family History  Problem Relation Age of Onset  . Heart disease Other   . Arthritis Other   . Cancer Other   . Asthma Other   . Diabetes Other      Social History Ms. Brandy reports that she has been smoking cigarettes. She has been smoking about 0.50 packs per day. She has never used smokeless tobacco. Ms. Slimak reports no history of alcohol use.   Review of Systems CONSTITUTIONAL: No weight loss, fever, chills, weakness or fatigue.  HEENT: Eyes: No visual loss, blurred vision, double vision or yellow sclerae.No hearing loss, sneezing, congestion, runny nose or sore throat.  SKIN: No rash or itching.  CARDIOVASCULAR:  RESPIRATORY: No shortness of breath, cough or sputum.  GASTROINTESTINAL: No anorexia, nausea, vomiting or diarrhea. No abdominal pain or blood.  GENITOURINARY: No burning on urination, no polyuria  NEUROLOGICAL: No headache, dizziness, syncope, paralysis, ataxia, numbness or tingling in the extremities. No change in bowel or bladder control.  MUSCULOSKELETAL: No muscle, back pain, joint pain or stiffness.  LYMPHATICS: No enlarged nodes. No history of splenectomy.  PSYCHIATRIC: No history of depression or anxiety.  ENDOCRINOLOGIC: No reports of sweating, cold or heat intolerance. No polyuria or polydipsia.  Marland Kitchen   Physical Examination There were no vitals filed for this visit. There were no vitals filed for this visit.  Gen: resting comfortably, no acute distress HEENT: no scleral icterus, pupils equal round and reactive, no palptable cervical adenopathy,  CV Resp: Clear to auscultation bilaterally GI: abdomen is soft, non-tender, non-distended, normal bowel sounds, no  hepatosplenomegaly MSK: extremities are warm, no edema.  Skin: warm, no rash Neuro:  no focal deficits Psych: appropriate affect   Diagnostic Studies  Jan 2021 echo 1. Left ventricular ejection fraction, by visual estimation, is 60 to  65%. The left ventricle has normal function. There is mildly increased  left ventricular hypertrophy.  2. The left ventricle has no regional wall motion abnormalities.  3. Global right ventricle has normal systolic function.The right  ventricular size is normal. No increase in right ventricular wall  thickness.  4. Left atrial size was normal.  5. Right atrial size was normal.  6. The mitral valve is normal in structure. No evidence of mitral valve  regurgitation. No evidence of mitral stenosis.  7. The tricuspid valve is normal in structure.  8. The tricuspid valve is normal in structure. Tricuspid valve  regurgitation is trivial.  9. The aortic valve has an indeterminant number of cusps. Aortic valve  regurgitation is not visualized. No evidence of aortic valve sclerosis or  stenosis.  10. The pulmonic valve was not well visualized. Pulmonic valve  regurgitation is not visualized.  11. Normal pulmonary artery systolic pressure.  12. The inferior vena cava is normal in size with greater than 50%  respiratory variability, suggesting right atrial pressure of 3 mmHg.    06/2019 event monitor  14 day event monitor  Min HR 53, Max HR 152, Avg HR 94  Rare supareventriclar ectopy overall, primarily in the form of isolated PACs. Did have some runs of atrial tachycardia up to 140 beats  Rare ventricular ectopy, primaily in the form of isolated PVCs.   Assessment and Plan  1. Palpitations - we will obtain a event monitor to evaluate for any significant arrhythmias as the cause of her symptoms, plan for 14 days - EKG today shows NSR      Arnoldo Lenis, M.D., F.A.C.C.

## 2020-03-20 ENCOUNTER — Encounter: Payer: Self-pay | Admitting: Emergency Medicine

## 2020-03-20 ENCOUNTER — Other Ambulatory Visit: Payer: Self-pay

## 2020-03-20 ENCOUNTER — Ambulatory Visit
Admission: EM | Admit: 2020-03-20 | Discharge: 2020-03-20 | Disposition: A | Discharge status: Home / Self Care | Payer: Self-pay | Attending: Emergency Medicine | Admitting: Emergency Medicine

## 2020-03-20 DIAGNOSIS — Z1152 Encounter for screening for COVID-19: Secondary | ICD-10-CM

## 2020-03-20 DIAGNOSIS — J069 Acute upper respiratory infection, unspecified: Secondary | ICD-10-CM

## 2020-03-20 MED ORDER — PREDNISONE 10 MG PO TABS: 20.0000 mg | ORAL_TABLET | Freq: Every day | ORAL | 0 refills | Status: DC

## 2020-03-20 MED ORDER — CETIRIZINE HCL 10 MG PO TABS: 10.0000 mg | ORAL_TABLET | Freq: Every day | ORAL | 0 refills | Status: DC

## 2020-03-20 MED ORDER — BENZONATATE 100 MG PO CAPS: 100.0000 mg | ORAL_CAPSULE | Freq: Three times a day (TID) | ORAL | 0 refills | Status: DC

## 2020-03-20 MED ORDER — FLUTICASONE PROPIONATE 50 MCG/ACT NA SUSP: 1.0000 | Freq: Every day | NASAL | 0 refills | Status: DC

## 2020-03-20 NOTE — ED Triage Notes (Signed)
Body aches, cough, ears fell stopped up and nasal congestion since yesterday.  Has not had the covid vaccines.

## 2020-03-20 NOTE — Discharge Instructions (Addendum)
COVID testing ordered.  It will take between 2-7 days for test results.  Someone will contact you regarding abnormal results.    In the meantime: You should remain isolated in your home for 10 days from symptom onset AND greater than 72 hours after symptoms resolution (absence of fever without the use of fever-reducing medication and improvement in respiratory symptoms), whichever is longer Get plenty of rest and push fluids Tessalon Perles prescribed for cough Zyrtec for nasal congestion, runny nose, and/or sore throat Flonase for nasal congestion and runny nose  Low-dose prednisone was prescribed Use medications daily for symptom relief Use OTC medications like ibuprofen or tylenol as needed fever or pain Call or go to the ED if you have any new or worsening symptoms such as fever, worsening cough, shortness of breath, chest tightness, chest pain, turning blue, changes in mental status, etc..Marland Kitchen

## 2020-03-20 NOTE — ED Provider Notes (Signed)
[facility]   [id number] 03/20/20 Arrival Time: 0830   CC: COVID symptoms  SUBJECTIVE: History from: patient.  Brenda Taylor is a 49 y.o. female who presents to the urgent care for complaint of nasal congestion, cough, body ache and ear fullness started yesterday.  Denies sick exposure to COVID, flu or strep.  Denies recent travel.  Has tried OTC medication without relief.  Denies aggravating factors.  Denies previous symptoms in the past.   Denies fever, chills, fatigue, sinus pain, rhinorrhea, sore throat, SOB, wheezing, chest pain, nausea, changes in bowel or bladder habits.     ROS: As per HPI.  All other pertinent ROS negative.      Past Medical History:  Diagnosis Date  . Anxiety   . Chronic pain   . DDD (degenerative disc disease)   . HBP (high blood pressure)   . Hernia   . Ulcer disease    Past Surgical History:  Procedure Laterality Date  . APPENDECTOMY    . BACK SURGERY     x 4  . CARPAL TUNNEL RELEASE     bilateral  . CHOLECYSTECTOMY    . COLONOSCOPY  10/20/2009   [id number] anal canal/left-side diverticula and multiple polyps. poor prep compromised exam. Next TCS 09/2014.  Marland Kitchen ESOPHAGOGASTRODUODENOSCOPY  03/06/2010   [id number] quadrant distal esophageal/patent tubular esophagus/small HH, antral erosions and ulcerations. Bx negative for persistent H.pylori  . ESOPHAGOGASTRODUODENOSCOPY  10/20/2009   [id number] HH/prepyloric antral ulcer s/p bx (+h.pylori), ERE  . INTRAUTERINE DEVICE INSERTION    . MASTECTOMY, PARTIAL     x 2  . MOUTH SURGERY    . ROTATOR CUFF REPAIR     right  . TUBAL LIGATION     Allergies  Allergen Reactions  . Amoxicillin     Reaction is unknown  . Aspirin     Cannot take due to ulcers   No current facility-administered medications on file prior to encounter.   Current Outpatient Medications on File Prior to Encounter  Medication Sig Dispense Refill  . busPIRone (BUSPAR) 15 MG tablet Take 1 tablet (15 mg total) by mouth 3  (three) times daily. 90 tablet 1  . clotrimazole (LOTRIMIN) 1 % cream Apply 1 application topically 2 (two) times daily. 60 g 1  . fluconazole (DIFLUCAN) 150 MG tablet Take one po today then one po in 1 week 2 tablet 0  . hydrochlorothiazide (HYDRODIURIL) 25 MG tablet Take 1 tablet (25 mg total) by mouth daily. 30 tablet 1  . lisinopril (ZESTRIL) 40 MG tablet Take 1 tablet (40 mg total) by mouth daily. 90 tablet 3   Social History   Socioeconomic History  . Marital status: Widowed    Spouse name: Not on file  . Number of children: Not on file  . Years of education: 61  . Highest education level: Not on file  Occupational History  . Occupation: Scientist, water quality  Tobacco Use  . Smoking status: Current Every Day Smoker    Packs/day: 1.00    Types: Cigarettes  . Smokeless tobacco: Never Used  Vaping Use  . Vaping Use: Never used  Substance and Sexual Activity  . Alcohol use: No  . Drug use: Yes    Frequency: 7.0 times per week    Types: Marijuana  . Sexual activity: Yes    Birth control/protection: I.U.D.  Other Topics Concern  . Not on file  Social History Narrative  . Not on file   Social Determinants of Health   Financial  Resource Strain:   . Difficulty of Paying Living Expenses: Not on file  Food Insecurity:   . Worried About Charity fundraiser in the Last Year: Not on file  . Ran Out of Food in the Last Year: Not on file  Transportation Needs:   . Lack of Transportation (Medical): Not on file  . Lack of Transportation (Non-Medical): Not on file  Physical Activity:   . Days of Exercise per Week: Not on file  . Minutes of Exercise per Session: Not on file  Stress:   . Feeling of Stress : Not on file  Social Connections:   . Frequency of Communication with Friends and Family: Not on file  . Frequency of Social Gatherings with Friends and Family: Not on file  . Attends Religious Services: Not on file  . Active Member of Clubs or Organizations: Not on file  . Attends Theatre manager Meetings: Not on file  . Marital Status: Not on file  Intimate Partner Violence:   . Fear of Current or Ex-Partner: Not on file  . Emotionally Abused: Not on file  . Physically Abused: Not on file  . Sexually Abused: Not on file   Family History  Problem Relation Age of Onset  . Heart disease Other   . Arthritis Other   . Cancer Other   . Asthma Other   . Diabetes Other     OBJECTIVE:  Vitals:   03/20/20 0912 03/20/20 0915  BP:  (!) 154/93  Pulse:  79  Resp:  19  Temp:  97.9 F (36.6 C)  TempSrc:  Oral  SpO2:  97%  Weight: 251 lb (113.9 kg)   Height: 5\' 7"  (1.702 m)      General appearance: alert; appears fatigued, but nontoxic; speaking in full sentences and tolerating own secretions HEENT: NCAT; Ears: EACs clear, TMs pearly gray; Eyes: PERRL.  EOM grossly intact. Sinuses: nontender; Nose: nares patent without rhinorrhea, Throat: oropharynx clear, tonsils non erythematous or enlarged, uvula midline  Neck: supple without LAD Lungs: unlabored respirations, symmetrical air entry; cough: moderate; no respiratory distress; CTAB Heart: regular rate and rhythm.  Radial pulses 2+ symmetrical bilaterally Skin: warm and dry Psychological: alert and cooperative; normal mood and affect  LABS:  No results found for this or any previous visit (from the past 24 hour(s)).   ASSESSMENT & PLAN:  1. URI with cough and congestion   2. Encounter for screening for COVID-19     Meds ordered this encounter  Medications  . predniSONE (DELTASONE) 10 MG tablet    Sig: Take 2 tablets (20 mg total) by mouth daily.    Dispense:  15 tablet    Refill:  0  . fluticasone (FLONASE) 50 MCG/ACT nasal spray    Sig: Place 1 spray into both nostrils daily for 14 days.    Dispense:  16 g    Refill:  0  . benzonatate (TESSALON) 100 MG capsule    Sig: Take 1 capsule (100 mg total) by mouth every 8 (eight) hours.    Dispense:  30 capsule    Refill:  0  . cetirizine (ZYRTEC ALLERGY)  10 MG tablet    Sig: Take 1 tablet (10 mg total) by mouth daily.    Dispense:  30 tablet    Refill:  0    Discharge Instructions  COVID testing ordered.  It will take between 2-7 days for test results.  Someone will contact you regarding abnormal results.  In the meantime: You should remain isolated in your home for 10 days from symptom onset AND greater than 72 hours after symptoms resolution (absence of fever without the use of fever-reducing medication and improvement in respiratory symptoms), whichever is longer Get plenty of rest and push fluids Tessalon Perles prescribed for cough Zyrtec for nasal congestion, runny nose, and/or sore throat Flonase for nasal congestion and runny nose  Low-dose prednisone was prescribed Use medications daily for symptom relief Use OTC medications like ibuprofen or tylenol as needed fever or pain Call or go to the ED if you have any new or worsening symptoms such as fever, worsening cough, shortness of breath, chest tightness, chest pain, turning blue, changes in mental status, etc...   Reviewed expectations re: course of current medical issues. Questions answered. Outlined signs and symptoms indicating need for more acute intervention. Patient verbalized understanding. After Visit Summary given.      Note: This document was prepared using Dragon voice recognition software and may include unintentional dictation errors.    Emerson Monte, [place] 03/20/20 [phone]

## 2020-03-21 LAB — NOVEL CORONAVIRUS, NAA: SARS-CoV-2, NAA: NOT DETECTED

## 2020-05-02 ENCOUNTER — Ambulatory Visit (INDEPENDENT_AMBULATORY_CARE_PROVIDER_SITE_OTHER): Payer: 59 | Admitting: Internal Medicine

## 2020-05-02 ENCOUNTER — Encounter: Payer: Self-pay | Admitting: Internal Medicine

## 2020-05-02 ENCOUNTER — Other Ambulatory Visit: Payer: Self-pay

## 2020-05-02 VITALS — BP 132/86 | HR 68 | Temp 97.3°F | Resp 18 | Ht 68.0 in | Wt 259.0 lb

## 2020-05-02 DIAGNOSIS — H9011 Conductive hearing loss, unilateral, right ear, with unrestricted hearing on the contralateral side: Secondary | ICD-10-CM

## 2020-05-02 DIAGNOSIS — E669 Obesity, unspecified: Secondary | ICD-10-CM

## 2020-05-02 DIAGNOSIS — I10 Essential (primary) hypertension: Secondary | ICD-10-CM

## 2020-05-02 DIAGNOSIS — H9191 Unspecified hearing loss, right ear: Secondary | ICD-10-CM

## 2020-05-02 DIAGNOSIS — Z131 Encounter for screening for diabetes mellitus: Secondary | ICD-10-CM

## 2020-05-02 DIAGNOSIS — Z720 Tobacco use: Secondary | ICD-10-CM

## 2020-05-02 DIAGNOSIS — Z23 Encounter for immunization: Secondary | ICD-10-CM | POA: Diagnosis not present

## 2020-05-02 DIAGNOSIS — F419 Anxiety disorder, unspecified: Secondary | ICD-10-CM

## 2020-05-02 DIAGNOSIS — R7303 Prediabetes: Secondary | ICD-10-CM | POA: Diagnosis not present

## 2020-05-02 DIAGNOSIS — Z7689 Persons encountering health services in other specified circumstances: Secondary | ICD-10-CM

## 2020-05-02 MED ORDER — BUSPIRONE HCL 15 MG PO TABS: 15.0000 mg | ORAL_TABLET | Freq: Three times a day (TID) | ORAL | 1 refills | Status: DC

## 2020-05-02 MED ORDER — HYDROCHLOROTHIAZIDE 25 MG PO TABS: 25.0000 mg | ORAL_TABLET | Freq: Every day | ORAL | 3 refills | Status: DC

## 2020-05-02 MED ORDER — LISINOPRIL 40 MG PO TABS: 40.0000 mg | ORAL_TABLET | Freq: Every day | ORAL | 3 refills | Status: DC

## 2020-05-02 NOTE — Assessment & Plan Note (Signed)
Chronic, intermittent AC > BC No tinnitus, ear discharge, fever, chills, balance problem Referred to ENT for evaluation Advised to avoid excessive nose blowing or inserting objects into ear for cleaning

## 2020-05-02 NOTE — Assessment & Plan Note (Signed)
BP: 132/86  Has not been taking medications recently. Refilled Lisinopril and HCTZ Advised to stay compliant with medications Advised DASH diet and moderate exercise as tolerated F/u CMP and lipid profile

## 2020-05-02 NOTE — Assessment & Plan Note (Signed)
Care established Previous chart reviewed History and medications reviewed with the patient 

## 2020-05-02 NOTE — Assessment & Plan Note (Signed)
Discussed diet modification and moderate exercise

## 2020-05-02 NOTE — Assessment & Plan Note (Signed)
HbA1C: 5.7 today in office Advised to follow diabetic diet On ACEi F/u CMP and lipid panel

## 2020-05-02 NOTE — Assessment & Plan Note (Signed)
Asked about quitting: confirms that she currently smokes cigarettes Advise to quit smoking: Educated about QUITTING to reduce the risk of cancer, cardio and cerebrovascular disease. Assess willingness: Unwilling to quit at this time, but is working on cutting back. Assist with counseling and pharmacotherapy: Counseled for 5 minutes and literature provided. Arrange for follow up: follow up in 3 months and continue to offer help.

## 2020-05-02 NOTE — Assessment & Plan Note (Signed)
Well-controlled with Buspar Refill provided

## 2020-05-02 NOTE — Patient Instructions (Signed)
You are being referred to ENT specialist for evaluation of right-sided hearing problem.  Meanwhile, please avoid placing any sharp objects into the ear for cleaning purposes.  If you have fever, chills, drainage from the ear or problems in balance, please get medical attention sooner.  Please continue to take blood pressure medications as prescribed.  Please get blood test done and we will discuss it in the next visit.  Please follow DASH diet for better control of blood pressure.  You are advised to perform moderate exercise or walking at least 150 minutes/week.

## 2020-05-02 NOTE — Progress Notes (Signed)
New Patient Office Visit  Subjective:  Patient ID: Brenda Taylor, female    DOB: 1970/09/09  Age: 49 y.o. MRN: [id number]  CC:  Chief Complaint  Patient presents with  . New Patient (Initial Visit)    new pt former dr Holly Bodily pt cant hear anything out of right ear needs to see ENT was referred but no one called her right hand is hurting in joint areas pt has been out of bp meds for the last 3 days     HPI Brenda Taylor is a 49 year old female with past medical history of hypertension, anxiety and colonic polyp presents for establishing care.  She is a former patient of Dr. Holly Bodily.  She states that she has not been able to take medications as she ran out of it.  She has been feeling headache and attributes it to not having been able to take the blood pressure medication.  Her headache is constant, dull, nonradiating with no exacerbating or alleviating factors.  She denies any dizziness, chest pain, dyspnea or palpitations.  She also has a history of anxiety and has been taking BuSpar for it.  She also mentions of right-sided hearing problem, which has been present intermittently for years.  She denies any tinnitus, balance problem ear pain or ear discharge.  Of note, patient had upper respiratory tract infection few weeks ago and she has tendency to blow nose strongly.  She had last colonoscopy about 7 years ago.  Patient currently smokes 1 pack/day and states that she has been trying to cut down.  She used to smoke 2 packs/day.  Patient received flu vaccine in the office today.  Past Medical History:  Diagnosis Date  . Anxiety   . Chronic pain   . DDD (degenerative disc disease)   . HBP (high blood pressure)   . Hernia   . Rotator cuff syndrome of left shoulder 10/09/2011  . Ulcer disease     Past Surgical History:  Procedure Laterality Date  . APPENDECTOMY    . BACK SURGERY     x 4  . CARPAL TUNNEL RELEASE     bilateral  . CHOLECYSTECTOMY    . COLONOSCOPY  10/20/2009    WJX:BJYNWGN anal canal/left-side diverticula and multiple polyps. poor prep compromised exam. Next TCS 09/2014.  Marland Kitchen ESOPHAGOGASTRODUODENOSCOPY  03/06/2010   FAO:ZHYQ quadrant distal esophageal/patent tubular esophagus/small HH, antral erosions and ulcerations. Bx negative for persistent H.pylori  . ESOPHAGOGASTRODUODENOSCOPY  10/20/2009   MVH:QIONG HH/prepyloric antral ulcer s/p bx (+h.pylori), ERE  . INTRAUTERINE DEVICE INSERTION    . MASTECTOMY, PARTIAL     x 2  . MOUTH SURGERY    . ROTATOR CUFF REPAIR     right  . TUBAL LIGATION      Family History  Problem Relation Age of Onset  . Heart disease Other   . Arthritis Other   . Cancer Other   . Asthma Other   . Diabetes Other     Social History   Socioeconomic History  . Marital status: Widowed    Spouse name: Not on file  . Number of children: Not on file  . Years of education: 92  . Highest education level: Not on file  Occupational History  . Occupation: Scientist, water quality  Tobacco Use  . Smoking status: Current Every Day Smoker    Packs/day: 1.00    Types: Cigarettes  . Smokeless tobacco: Never Used  Vaping Use  . Vaping Use: Never used  Substance and Sexual  Activity  . Alcohol use: No  . Drug use: Yes    Frequency: 7.0 times per week    Types: Marijuana  . Sexual activity: Yes    Birth control/protection: I.U.D.  Other Topics Concern  . Not on file  Social History Narrative  . Not on file   Social Determinants of Health   Financial Resource Strain:   . Difficulty of Paying Living Expenses: Not on file  Food Insecurity:   . Worried About Charity fundraiser in the Last Year: Not on file  . Ran Out of Food in the Last Year: Not on file  Transportation Needs:   . Lack of Transportation (Medical): Not on file  . Lack of Transportation (Non-Medical): Not on file  Physical Activity:   . Days of Exercise per Week: Not on file  . Minutes of Exercise per Session: Not on file  Stress:   . Feeling of Stress : Not on file    Social Connections:   . Frequency of Communication with Friends and Family: Not on file  . Frequency of Social Gatherings with Friends and Family: Not on file  . Attends Religious Services: Not on file  . Active Member of Clubs or Organizations: Not on file  . Attends Archivist Meetings: Not on file  . Marital Status: Not on file  Intimate Partner Violence:   . Fear of Current or Ex-Partner: Not on file  . Emotionally Abused: Not on file  . Physically Abused: Not on file  . Sexually Abused: Not on file    ROS Review of Systems  Constitutional: Negative for chills and fever.  HENT: Positive for hearing loss (Right sided). Negative for congestion, postnasal drip, sinus pressure, sinus pain and sore throat.   Eyes: Negative for pain and discharge.  Respiratory: Negative for cough and shortness of breath.   Cardiovascular: Negative for chest pain and palpitations.  Gastrointestinal: Negative for abdominal pain, constipation, diarrhea, nausea and vomiting.  Endocrine: Negative for polydipsia and polyuria.  Genitourinary: Negative for dysuria and hematuria.  Musculoskeletal: Negative for neck pain and neck stiffness.  Skin: Negative for rash.  Neurological: Positive for headaches. Negative for dizziness, seizures, syncope, speech difficulty, weakness and light-headedness.  Psychiatric/Behavioral: Negative for agitation and behavioral problems.    Objective:   Today's Vitals: BP 132/86 (BP Location: Right Arm, Patient Position: Sitting, Cuff Size: Normal)   Pulse 68   Temp (!) 97.3 F (36.3 C) (Temporal)   Resp 18   Ht 5\' 8"  (1.727 m)   Wt 259 lb (117.5 kg)   SpO2 98%   BMI 39.38 kg/m   Physical Exam Vitals reviewed.  Constitutional:      General: She is not in acute distress.    Appearance: She is not diaphoretic.  HENT:     Head: Normocephalic and atraumatic.     Nose: Nose normal.     Mouth/Throat:     Mouth: Mucous membranes are moist.  Eyes:      General: No scleral icterus.    Extraocular Movements: Extraocular movements intact.     Pupils: Pupils are equal, round, and reactive to light.  Cardiovascular:     Rate and Rhythm: Normal rate and regular rhythm.     Pulses: Normal pulses.     Heart sounds: No murmur heard.   Pulmonary:     Breath sounds: Normal breath sounds. No wheezing or rales.  Abdominal:     Palpations: Abdomen is soft.  Tenderness: There is no abdominal tenderness.  Musculoskeletal:     Cervical back: Neck supple. No tenderness.     Right lower leg: No edema.     Left lower leg: No edema.  Skin:    General: Skin is warm.     Findings: No rash.  Neurological:     General: No focal deficit present.     Mental Status: She is alert and oriented to person, place, and time.     Cranial Nerves: No cranial nerve deficit.     Sensory: No sensory deficit.  Psychiatric:        Mood and Affect: Mood normal.        Behavior: Behavior normal.     Assessment & Plan:   Problem List Items Addressed This Visit          Encounter to establish care Care established Previous chart reviewed History and medications reviewed with the patient  Hypertension BP: 132/86  Has not been taking medications recently. Refilled Lisinopril and HCTZ Advised to stay compliant with medications Advised DASH diet and moderate exercise as tolerated F/u CMP and lipid profile  Anxiety Well-controlled with Buspar Refill provided  Hearing problem of right ear Chronic, intermittent AC > BC No tinnitus, ear discharge, fever, chills, balance problem Referred to ENT for evaluation Advised to avoid excessive nose blowing or inserting objects into ear for cleaning  Obesity (BMI 30-39.9) Discussed diet modification and moderate exercise  Tobacco abuse Asked about quitting: confirms that she currently smokes cigarettes Advise to quit smoking: Educated about QUITTING to reduce the risk of cancer, cardio and cerebrovascular  disease. Assess willingness: Unwilling to quit at this time, but is working on cutting back. Assist with counseling and pharmacotherapy: Counseled for 5 minutes and literature provided. Arrange for follow up: follow up in 3 months and continue to offer help.  Prediabetes HbA1C: 5.7 today in office Advised to follow diabetic diet On ACEi F/u CMP and lipid panel      Other Visit Diagnoses    Conductive hearing loss of right ear, unspecified hearing status on contralateral side       Relevant Orders   Ambulatory referral to ENT   Screening for diabetes mellitus       Relevant Orders   Hemoglobin A1c   Need for immunization against influenza       Relevant Orders   Flu Vaccine QUAD 36+ mos IM (Completed)      Outpatient Encounter Medications as of 05/02/2020  Medication Sig  . [DISCONTINUED] gabapentin (NEURONTIN) 300 MG capsule Take by mouth.  . busPIRone (BUSPAR) 15 MG tablet Take 1 tablet (15 mg total) by mouth 3 (three) times daily.  . fluticasone (FLONASE) 50 MCG/ACT nasal spray Place 1 spray into both nostrils daily for 14 days.  . hydrochlorothiazide (HYDRODIURIL) 25 MG tablet Take 1 tablet (25 mg total) by mouth daily.  Marland Kitchen lisinopril (ZESTRIL) 40 MG tablet Take 1 tablet (40 mg total) by mouth daily.  . [DISCONTINUED] benzonatate (TESSALON) 100 MG capsule Take 1 capsule (100 mg total) by mouth every 8 (eight) hours. (Patient not taking: Reported on 05/02/2020)  . [DISCONTINUED] busPIRone (BUSPAR) 15 MG tablet Take 1 tablet (15 mg total) by mouth 3 (three) times daily. (Patient not taking: Reported on 05/02/2020)  . [DISCONTINUED] cetirizine (ZYRTEC ALLERGY) 10 MG tablet Take 1 tablet (10 mg total) by mouth daily. (Patient not taking: Reported on 05/02/2020)  . [DISCONTINUED] clotrimazole (LOTRIMIN) 1 % cream Apply 1 application topically 2 (  two) times daily. (Patient not taking: Reported on 05/02/2020)  . [DISCONTINUED] fluconazole (DIFLUCAN) 150 MG tablet Take one po today  then one po in 1 week (Patient not taking: Reported on 05/02/2020)  . [DISCONTINUED] hydrochlorothiazide (HYDRODIURIL) 25 MG tablet Take 1 tablet (25 mg total) by mouth daily. (Patient not taking: Reported on 05/02/2020)  . [DISCONTINUED] lisinopril (ZESTRIL) 40 MG tablet Take 1 tablet (40 mg total) by mouth daily. (Patient not taking: Reported on 05/02/2020)  . [DISCONTINUED] predniSONE (DELTASONE) 10 MG tablet Take 2 tablets (20 mg total) by mouth daily. (Patient not taking: Reported on 05/02/2020)   No facility-administered encounter medications on file as of 05/02/2020.    Follow-up: Return in about 3 weeks (around 05/23/2020).   Lindell Spar, MD

## 2020-05-05 LAB — LIPID PANEL
Chol/HDL Ratio: 4.8 ratio — ABNORMAL HIGH (ref 0.0–4.4)
Cholesterol, Total: 153 mg/dL (ref 100–199)
HDL: 32 mg/dL — ABNORMAL LOW (ref >39)
LDL Chol Calc (NIH): 103 mg/dL — ABNORMAL HIGH (ref 0–99)
Triglycerides: 97 mg/dL (ref 0–149)
VLDL Cholesterol Cal: 18 mg/dL (ref 5–40)

## 2020-05-05 LAB — CMP14+EGFR
ALT: 17 IU/L (ref 0–32)
AST: 17 IU/L (ref 0–40)
Albumin/Globulin Ratio: 1.3 (ref 1.2–2.2)
Albumin: 4.3 g/dL (ref 3.8–4.9)
Alkaline Phosphatase: 101 IU/L (ref 44–121)
BUN/Creatinine Ratio: 22 (ref 9–23)
BUN: 19 mg/dL (ref 6–24)
Bilirubin Total: <0.2 mg/dL (ref 0.0–1.2)
CO2: 24 mmol/L (ref 20–29)
Calcium: 9.5 mg/dL (ref 8.7–10.2)
Chloride: 101 mmol/L (ref 96–106)
Creatinine, Ser: 0.88 mg/dL (ref 0.57–1.00)
GFR calc Af Amer: 88 mL/min/{1.73_m2} (ref >59)
GFR calc non Af Amer: 76 mL/min/{1.73_m2} (ref >59)
Globulin, Total: 3.2 g/dL (ref 1.5–4.5)
Glucose: 102 mg/dL — ABNORMAL HIGH (ref 65–99)
Potassium: 4.4 mmol/L (ref 3.5–5.2)
Sodium: 139 mmol/L (ref 134–144)
Total Protein: 7.5 g/dL (ref 6.0–8.5)

## 2020-05-05 LAB — CBC
Hematocrit: 38 % (ref 34.0–46.6)
Hemoglobin: 12.9 g/dL (ref 11.1–15.9)
MCH: 30.1 pg (ref 26.6–33.0)
MCHC: 33.9 g/dL (ref 31.5–35.7)
MCV: 89 fL (ref 79–97)
Platelets: 301 10*3/uL (ref 150–450)
RBC: 4.29 x10E6/uL (ref 3.77–5.28)
RDW: 13 % (ref 11.7–15.4)
WBC: 13.3 10*3/uL — ABNORMAL HIGH (ref 3.4–10.8)

## 2020-05-05 LAB — HEMOGLOBIN A1C
Est. average glucose Bld gHb Est-mCnc: 120 mg/dL
Hgb A1c MFr Bld: 5.8 % — ABNORMAL HIGH (ref 4.8–5.6)

## 2020-05-23 ENCOUNTER — Encounter: Payer: Self-pay | Admitting: Internal Medicine

## 2020-05-23 ENCOUNTER — Other Ambulatory Visit: Payer: Self-pay

## 2020-05-23 ENCOUNTER — Ambulatory Visit (INDEPENDENT_AMBULATORY_CARE_PROVIDER_SITE_OTHER): Payer: 59 | Admitting: Internal Medicine

## 2020-05-23 VITALS — BP 142/84 | HR 97 | Ht 67.0 in | Wt 258.0 lb

## 2020-05-23 DIAGNOSIS — M1990 Unspecified osteoarthritis, unspecified site: Secondary | ICD-10-CM | POA: Diagnosis not present

## 2020-05-23 DIAGNOSIS — I10 Essential (primary) hypertension: Secondary | ICD-10-CM

## 2020-05-23 DIAGNOSIS — G609 Hereditary and idiopathic neuropathy, unspecified: Secondary | ICD-10-CM | POA: Diagnosis not present

## 2020-05-23 DIAGNOSIS — H9191 Unspecified hearing loss, right ear: Secondary | ICD-10-CM | POA: Diagnosis not present

## 2020-05-23 MED ORDER — GABAPENTIN 100 MG PO CAPS: 100.0000 mg | ORAL_CAPSULE | Freq: Two times a day (BID) | ORAL | 2 refills | Status: DC

## 2020-05-23 MED ORDER — BENZONATATE 100 MG PO CAPS: 100.0000 mg | ORAL_CAPSULE | Freq: Two times a day (BID) | ORAL | 1 refills | Status: DC | PRN

## 2020-05-23 NOTE — Progress Notes (Signed)
Established Patient Office Visit  Subjective:  Patient ID: Brenda Taylor, female    DOB: 12-06-1970  Age: 49 y.o. MRN: [id number]  CC:  Chief Complaint  Patient presents with   Follow-up    HPI Brenda Taylor is a 49 year old female with past medical history of hypertension, anxiety and colonic polyp presents for follow up of her blood pressure. Patient has been taking Lisinopril and HCTZ regularly. Patient's BP is 142/84 today. She attributes the high BP to recent stress and rushing to the office today. She denies headache, dizziness, chest pain, dyspnea or palpitations.  Patient also c/o burning pain on the lateral side of the right foot. She denies tingling or weakness of the feet.  Patient also c/o b/l hand joints pain and morning stiffness, which lasts about an hour. She shows involvement of MCP and proximal ITP joints, but denies distal ITP joint involvement. Patient is unable to make a fist on right hand. Patient denies rash, other joints involvement or weakness of the hands.  Patient denies any known family h/o autoimmune arthritis.  Past Medical History:  Diagnosis Date   Anxiety    Chronic pain    DDD (degenerative disc disease)    HBP (high blood pressure)    Hernia    Rotator cuff syndrome of left shoulder 10/09/2011   Ulcer disease     Past Surgical History:  Procedure Laterality Date   APPENDECTOMY     BACK SURGERY     x 4   CARPAL TUNNEL RELEASE     bilateral   CHOLECYSTECTOMY     COLONOSCOPY  10/20/2009   LOV:FIEPPIR anal canal/left-side diverticula and multiple polyps. poor prep compromised exam. Next TCS 09/2014.   ESOPHAGOGASTRODUODENOSCOPY  03/06/2010   JJO:ACZY quadrant distal esophageal/patent tubular esophagus/small HH, antral erosions and ulcerations. Bx negative for persistent H.pylori   ESOPHAGOGASTRODUODENOSCOPY  10/20/2009   SAY:TKZSW HH/prepyloric antral ulcer s/p bx (+h.pylori), ERE   INTRAUTERINE DEVICE INSERTION     MASTECTOMY,  PARTIAL     x 2   MOUTH SURGERY     ROTATOR CUFF REPAIR     right   TUBAL LIGATION      Family History  Problem Relation Age of Onset   Heart disease Other    Arthritis Other    Cancer Other    Asthma Other    Diabetes Other     Social History   Socioeconomic History   Marital status: Widowed    Spouse name: Not on file   Number of children: Not on file   Years of education: 9   Highest education level: Not on file  Occupational History   Occupation: Scientist, water quality  Tobacco Use   Smoking status: Current Every Day Smoker    Packs/day: 1.00    Types: Cigarettes   Smokeless tobacco: Never Used  Vaping Use   Vaping Use: Never used  Substance and Sexual Activity   Alcohol use: No   Drug use: Yes    Frequency: 7.0 times per week    Types: Marijuana   Sexual activity: Yes    Birth control/protection: I.U.D.  Other Topics Concern   Not on file  Social History Narrative   Not on file   Social Determinants of Health   Financial Resource Strain:    Difficulty of Paying Living Expenses: Not on file  Food Insecurity:    Worried About [place] in the Last Year: Not on file   [employer] of Food  in the Last Year: Not on file  Transportation Needs:    Lack of Transportation (Medical): Not on file   Lack of Transportation (Non-Medical): Not on file  Physical Activity:    Days of Exercise per Week: Not on file   Minutes of Exercise per Session: Not on file  Stress:    Feeling of Stress : Not on file  Social Connections:    Frequency of Communication with Friends and Family: Not on file   Frequency of Social Gatherings with Friends and Family: Not on file   Attends Religious Services: Not on file   Active Member of Clubs or Organizations: Not on file   Attends Archivist Meetings: Not on file   Marital Status: Not on file  Intimate Partner Violence:    Fear of Current or Ex-Partner: Not on file   Emotionally Abused: Not  on file   Physically Abused: Not on file   Sexually Abused: Not on file    Outpatient Medications Prior to Visit  Medication Sig Dispense Refill   busPIRone (BUSPAR) 15 MG tablet Take 1 tablet (15 mg total) by mouth 3 (three) times daily. 90 tablet 1   hydrochlorothiazide (HYDRODIURIL) 25 MG tablet Take 1 tablet (25 mg total) by mouth daily. 30 tablet 3   lisinopril (ZESTRIL) 40 MG tablet Take 1 tablet (40 mg total) by mouth daily. 90 tablet 3   fluticasone (FLONASE) 50 MCG/ACT nasal spray Place 1 spray into both nostrils daily for 14 days. 16 g 0   No facility-administered medications prior to visit.    Allergies  Allergen Reactions   Amoxicillin     Reaction is unknown   Aspirin     Cannot take due to ulcers    ROS Review of Systems  Constitutional: Negative for chills and fever.  HENT: Positive for hearing loss (Right sided). Negative for congestion, postnasal drip, sinus pressure, sinus pain and sore throat.   Eyes: Negative for pain and discharge.  Respiratory: Negative for cough and shortness of breath.   Cardiovascular: Negative for chest pain and palpitations.  Gastrointestinal: Negative for abdominal pain, constipation, diarrhea, nausea and vomiting.  Endocrine: Negative for polydipsia and polyuria.  Genitourinary: Negative for dysuria and hematuria.  Musculoskeletal: Positive for arthralgias (B/l MTP and ITP joints). Negative for neck pain and neck stiffness.  Skin: Negative for rash.  Neurological: Negative for dizziness, seizures, syncope, speech difficulty, weakness and light-headedness.  Psychiatric/Behavioral: Negative for agitation and behavioral problems.      Objective:    Physical Exam Vitals reviewed.  Constitutional:      General: She is not in acute distress.    Appearance: She is obese. She is not diaphoretic.  HENT:     Head: Normocephalic and atraumatic.     Nose: Nose normal. No congestion.     Mouth/Throat:     Mouth: Mucous  membranes are moist.     Pharynx: No posterior oropharyngeal erythema.  Eyes:     General: No scleral icterus.    Extraocular Movements: Extraocular movements intact.     Pupils: Pupils are equal, round, and reactive to light.  Cardiovascular:     Rate and Rhythm: Normal rate and regular rhythm.     Heart sounds: No murmur heard.   Pulmonary:     Breath sounds: Normal breath sounds. No wheezing or rales.  Abdominal:     Palpations: Abdomen is soft.     Tenderness: There is no abdominal tenderness.  Musculoskeletal:  General: Tenderness (B/l MTP and ITP joints, mild swelling) present.     Cervical back: Neck supple. No tenderness.     Right lower leg: No edema.     Left lower leg: No edema.  Skin:    General: Skin is warm.     Findings: No rash.  Neurological:     General: No focal deficit present.     Mental Status: She is alert and oriented to person, place, and time.  Psychiatric:        Mood and Affect: Mood normal.        Behavior: Behavior normal.     BP (!) 142/84 (BP Location: Right Arm, Patient Position: Sitting, Cuff Size: Large)    Pulse 97    Ht _0  (1.702 m)    Wt 258 lb (117 kg)    SpO2 97%    BMI 40.41 kg/m  Wt Readings from Last 3 Encounters:  05/23/20 258 lb (117 kg)  05/02/20 259 lb (117.5 kg)  03/20/20 251 lb (113.9 kg)     Health Maintenance Due  Topic Date Due   Hepatitis C Screening  Never done   HIV Screening  Never done   PAP SMEAR-Modifier  Never done    There are no preventive care reminders to display for this patient.  Lab Results  Component Value Date   TSH 2.42 04/24/2019   Lab Results  Component Value Date   WBC 13.3 (H) 05/04/2020   HGB 12.9 05/04/2020   HCT 38.0 05/04/2020   MCV 89 05/04/2020   PLT 301 05/04/2020   Lab Results  Component Value Date   NA 139 05/04/2020   K 4.4 05/04/2020   CO2 24 05/04/2020   GLUCOSE 102 (H) 05/04/2020   BUN 19 05/04/2020   CREATININE 0.88 05/04/2020   BILITOT <0.2  05/04/2020   ALKPHOS 101 05/04/2020   AST 17 05/04/2020   ALT 17 05/04/2020   PROT 7.5 05/04/2020   ALBUMIN 4.3 05/04/2020   CALCIUM 9.5 05/04/2020   ANIONGAP 7 01/17/2018   Lab Results  Component Value Date   CHOL 153 05/04/2020   Lab Results  Component Value Date   HDL 32 (L) 05/04/2020   Lab Results  Component Value Date   LDLCALC 103 (H) 05/04/2020   Lab Results  Component Value Date   TRIG 97 05/04/2020   Lab Results  Component Value Date   CHOLHDL 4.8 (H) 05/04/2020   Lab Results  Component Value Date   HGBA1C 5.8 (H) 05/04/2020      Assessment & Plan:   Problem List Items Addressed This Visit      Cardiovascular and Mediastinum   Hypertension - Primary BP Readings from Last 1 Encounters:  05/23/20 (!) 142/84   Likely elevated in the setting of pain and/or anxiety Counseled for compliance with the medications Advised DASH diet and moderate exercise/walking, at least 150 mins/week     Other Visit Diagnoses    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    Dispense:  60 capsule    Refill:  2   benzonatate (TESSALON) 100 MG capsule    Sig: Take 1 capsule (100 mg total) by mouth 2 (two) times daily as needed for cough.    Dispense:  30 capsule    Refill:  1    Follow-up: No follow-ups on file.   Lindell Spar, MD

## 2020-05-23 NOTE — Patient Instructions (Signed)
Please get blood tests done within a week.  Please continue to perform simple exercises of the hands to help with the stiffness. Okay to take Tylenol PRN for pain/swelling.  Please start taking Vitamin B12 1000 mcg once daily. Please take Gabapentin for neuropathic pain in the foot.  Please continue to take medications for blood pressure. Follow DASH diet and moderate exercise, at least 150 mins/week.  Please try to quit smoking as soon as possible.  We will schedule a phone visit after the blood tests.

## 2020-05-26 LAB — RHEUMATOID FACTOR: Rheumatoid fact SerPl-aCnc: <10 IU/mL (ref 0.0–13.9)

## 2020-05-26 LAB — CYCLIC CITRUL PEPTIDE ANTIBODY, IGG/IGA: Cyclic Citrullin Peptide Ab: 7 units (ref 0–19)

## 2020-05-26 LAB — SEDIMENTATION RATE: Sed Rate: 20 mm/hr (ref 0–40)

## 2020-05-26 LAB — ANA: ANA Titer 1: NEGATIVE

## 2020-06-01 ENCOUNTER — Other Ambulatory Visit: Payer: Self-pay

## 2020-06-01 ENCOUNTER — Encounter: Payer: Self-pay | Admitting: Internal Medicine

## 2020-06-01 ENCOUNTER — Ambulatory Visit (INDEPENDENT_AMBULATORY_CARE_PROVIDER_SITE_OTHER): Payer: 59 | Admitting: Internal Medicine

## 2020-06-01 VITALS — BP 102/74 | HR 115 | Temp 98.8°F | Resp 18 | Ht 68.0 in | Wt 258.0 lb

## 2020-06-01 DIAGNOSIS — E1140 Type 2 diabetes mellitus with diabetic neuropathy, unspecified: Secondary | ICD-10-CM | POA: Insufficient documentation

## 2020-06-01 DIAGNOSIS — G609 Hereditary and idiopathic neuropathy, unspecified: Secondary | ICD-10-CM

## 2020-06-01 DIAGNOSIS — M1990 Unspecified osteoarthritis, unspecified site: Secondary | ICD-10-CM

## 2020-06-01 NOTE — Patient Instructions (Addendum)
You are being referred to Rheumatologist for evaluation of autoimmune arthritis.  Please take Tylenol as needed for pain/swelling.  Okay to take Gabapentin up to 300 mg twice daily for burning pain in soles.  Please continue simple hand exercises as discussed in the previous visit.

## 2020-06-01 NOTE — Assessment & Plan Note (Addendum)
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

## 2020-06-01 NOTE — Assessment & Plan Note (Signed)
No symptom relief with Gabapentin 100 mg BID, advised to increase it up to 300 mg BID

## 2020-06-01 NOTE — Progress Notes (Signed)
Established Patient Office Visit  Subjective:  Patient ID: Brenda Taylor, female    DOB: 28-Jul-1970  Age: 49 y.o. MRN: [id number]  CC:  Chief Complaint  Patient presents with  . Hypertension  . Hyperlipidemia    HPI Brenda Taylor presents for follow-up of her bilateral hand pain and stiffness and for discussion of her recent blood tests.  She continues to have pain in the MTP and PIP joints of both the hands.  She has morning stiffness which lasts more than an hour.  ENA, ESR and anti-CCP have been unremarkable.  She also complains of burning sensation in the right foot, for which gabapentin was prescribed.  She states that it has helped minimally.  She denies any numbness, weakness or tingling sensation in the feet.  Past Medical History:  Diagnosis Date  . Anxiety   . Chronic pain   . DDD (degenerative disc disease)   . HBP (high blood pressure)   . Hernia   . Rotator cuff syndrome of left shoulder 10/09/2011  . Ulcer disease     Past Surgical History:  Procedure Laterality Date  . APPENDECTOMY    . BACK SURGERY     x 4  . CARPAL TUNNEL RELEASE     bilateral  . CHOLECYSTECTOMY    . COLONOSCOPY  10/20/2009   MOQ:HUTMLYY anal canal/left-side diverticula and multiple polyps. poor prep compromised exam. Next TCS 09/2014.  Marland Kitchen ESOPHAGOGASTRODUODENOSCOPY  03/06/2010   TKP:TWSF quadrant distal esophageal/patent tubular esophagus/small HH, antral erosions and ulcerations. Bx negative for persistent H.pylori  . ESOPHAGOGASTRODUODENOSCOPY  10/20/2009   KCL:EXNTZ HH/prepyloric antral ulcer s/p bx (+h.pylori), ERE  . INTRAUTERINE DEVICE INSERTION    . MASTECTOMY, PARTIAL     x 2  . MOUTH SURGERY    . ROTATOR CUFF REPAIR     right  . TUBAL LIGATION      Family History  Problem Relation Age of Onset  . Heart disease Other   . Arthritis Other   . Cancer Other   . Asthma Other   . Diabetes Other     Social History   Socioeconomic History  . Marital status: Widowed     Spouse name: Not on file  . Number of children: Not on file  . Years of education: 9  . Highest education level: Not on file  Occupational History  . Occupation: Scientist, water quality  Tobacco Use  . Smoking status: Current Every Day Smoker    Packs/day: 1.00    Types: Cigarettes  . Smokeless tobacco: Never Used  Vaping Use  . Vaping Use: Never used  Substance and Sexual Activity  . Alcohol use: No  . Drug use: Yes    Frequency: 7.0 times per week    Types: Marijuana  . Sexual activity: Yes    Birth control/protection: I.U.D.  Other Topics Concern  . Not on file  Social History Narrative  . Not on file   Social Determinants of Health   Financial Resource Strain:   . Difficulty of Paying Living Expenses: Not on file  Food Insecurity:   . Worried About Charity fundraiser in the Last Year: Not on file  . Ran Out of Food in the Last Year: Not on file  Transportation Needs:   . Lack of Transportation (Medical): Not on file  . Lack of Transportation (Non-Medical): Not on file  Physical Activity:   . Days of Exercise per Week: Not on file  . Minutes of Exercise per  Session: Not on file  Stress:   . Feeling of Stress : Not on file  Social Connections:   . Frequency of Communication with Friends and Family: Not on file  . Frequency of Social Gatherings with Friends and Family: Not on file  . Attends Religious Services: Not on file  . Active Member of Clubs or Organizations: Not on file  . Attends Archivist Meetings: Not on file  . Marital Status: Not on file  Intimate Partner Violence:   . Fear of Current or Ex-Partner: Not on file  . Emotionally Abused: Not on file  . Physically Abused: Not on file  . Sexually Abused: Not on file    Outpatient Medications Prior to Visit  Medication Sig Dispense Refill  . benzonatate (TESSALON) 100 MG capsule Take 1 capsule (100 mg total) by mouth 2 (two) times daily as needed for cough. 30 capsule 1  . busPIRone (BUSPAR) 15 MG tablet  Take 1 tablet (15 mg total) by mouth 3 (three) times daily. 90 tablet 1  . gabapentin (NEURONTIN) 100 MG capsule Take 1 capsule (100 mg total) by mouth 2 (two) times daily. 60 capsule 2  . hydrochlorothiazide (HYDRODIURIL) 25 MG tablet Take 1 tablet (25 mg total) by mouth daily. 30 tablet 3  . lisinopril (ZESTRIL) 40 MG tablet Take 1 tablet (40 mg total) by mouth daily. 90 tablet 3  . fluticasone (FLONASE) 50 MCG/ACT nasal spray Place 1 spray into both nostrils daily for 14 days. 16 g 0   No facility-administered medications prior to visit.    Allergies  Allergen Reactions  . Amoxicillin     Reaction is unknown  . Aspirin     Cannot take due to ulcers    ROS Review of Systems  Constitutional: Negative for chills and fever.  HENT: Positive for hearing loss (Right sided). Negative for congestion, postnasal drip, sinus pressure, sinus pain and sore throat.   Eyes: Negative for pain and discharge.  Respiratory: Negative for cough and shortness of breath.   Cardiovascular: Negative for chest pain and palpitations.  Gastrointestinal: Negative for abdominal pain, constipation, diarrhea, nausea and vomiting.  Endocrine: Negative for polydipsia and polyuria.  Genitourinary: Negative for dysuria and hematuria.  Musculoskeletal: Positive for arthralgias (B/l MTP and ITP joints). Negative for neck pain and neck stiffness.  Skin: Negative for rash.  Neurological: Negative for dizziness, seizures, syncope, speech difficulty, weakness and light-headedness.  Psychiatric/Behavioral: Negative for agitation and behavioral problems.      Objective:    Physical Exam Vitals reviewed.  Constitutional:      General: She is not in acute distress.    Appearance: She is obese. She is not diaphoretic.  HENT:     Head: Normocephalic and atraumatic.     Nose: Nose normal. No congestion.     Mouth/Throat:     Mouth: Mucous membranes are moist.     Pharynx: No posterior oropharyngeal erythema.  Eyes:      General: No scleral icterus.    Extraocular Movements: Extraocular movements intact.     Pupils: Pupils are equal, round, and reactive to light.  Cardiovascular:     Rate and Rhythm: Normal rate and regular rhythm.     Heart sounds: No murmur heard.   Pulmonary:     Breath sounds: Normal breath sounds. No wheezing or rales.  Abdominal:     Palpations: Abdomen is soft.     Tenderness: There is no abdominal tenderness.  Musculoskeletal:  General: Tenderness (B/l MTP and ITP joints, mild swelling) present.     Cervical back: Neck supple. No tenderness.     Right lower leg: No edema.     Left lower leg: No edema.  Skin:    General: Skin is warm.     Findings: No rash.  Neurological:     General: No focal deficit present.     Mental Status: She is alert and oriented to person, place, and time.  Psychiatric:        Mood and Affect: Mood normal.        Behavior: Behavior normal.     BP 102/74   Pulse (!) 115   Temp 98.8 F (37.1 C)   Resp 18   Ht _0  (1.727 m)   Wt 258 lb (117 kg)   SpO2 96%   BMI 39.23 kg/m  Wt Readings from Last 3 Encounters:  06/01/20 258 lb (117 kg)  05/23/20 258 lb (117 kg)  05/02/20 259 lb (117.5 kg)     Health Maintenance Due  Topic Date Due  . Hepatitis C Screening  Never done  . HIV Screening  Never done  . PAP SMEAR-Modifier  Never done    There are no preventive care reminders to display for this patient.  Lab Results  Component Value Date   TSH 2.42 04/24/2019   Lab Results  Component Value Date   WBC 13.3 (H) 05/04/2020   HGB 12.9 05/04/2020   HCT 38.0 05/04/2020   MCV 89 05/04/2020   PLT 301 05/04/2020   Lab Results  Component Value Date   NA 139 05/04/2020   K 4.4 05/04/2020   CO2 24 05/04/2020   GLUCOSE 102 (H) 05/04/2020   BUN 19 05/04/2020   CREATININE 0.88 05/04/2020   BILITOT <0.2 05/04/2020   ALKPHOS 101 05/04/2020   AST 17 05/04/2020   ALT 17 05/04/2020   PROT 7.5 05/04/2020   ALBUMIN 4.3  05/04/2020   CALCIUM 9.5 05/04/2020   ANIONGAP 7 01/17/2018   Lab Results  Component Value Date   CHOL 153 05/04/2020   Lab Results  Component Value Date   HDL 32 (L) 05/04/2020   Lab Results  Component Value Date   LDLCALC 103 (H) 05/04/2020   Lab Results  Component Value Date   TRIG 97 05/04/2020   Lab Results  Component Value Date   CHOLHDL 4.8 (H) 05/04/2020   Lab Results  Component Value Date   HGBA1C 5.8 (H) 05/04/2020      Assessment & Plan:   Problem List Items Addressed This Visit      Nervous and Auditory   Idiopathic peripheral neuropathy    No symptom relief with Gabapentin 100 mg BID, advised to increase it up to 300 mg BID        Musculoskeletal and Integument   Arthritis - Primary    ANA, ESR - wnl and Anti-CCP negative MTP and PIP involvement suggest autoimmune etiology Referred to Rheumatology for evaluation of seronegative RA Continue Tylenol/Ibuprofen and simple hand exercises      Relevant Orders   Ambulatory referral to Rheumatology      No orders of the defined types were placed in this encounter.   Follow-up: Return in about 3 months (around 09/01/2020).    Lindell Spar, MD

## 2020-06-28 ENCOUNTER — Ambulatory Visit: Payer: Self-pay

## 2020-06-28 ENCOUNTER — Ambulatory Visit (INDEPENDENT_AMBULATORY_CARE_PROVIDER_SITE_OTHER): Payer: 59 | Admitting: Internal Medicine

## 2020-06-28 ENCOUNTER — Encounter: Payer: Self-pay | Admitting: Internal Medicine

## 2020-06-28 ENCOUNTER — Other Ambulatory Visit: Payer: Self-pay

## 2020-06-28 VITALS — BP 144/86 | HR 87 | Ht 66.0 in | Wt 256.0 lb

## 2020-06-28 DIAGNOSIS — R29898 Other symptoms and signs involving the musculoskeletal system: Secondary | ICD-10-CM

## 2020-06-28 DIAGNOSIS — M79642 Pain in left hand: Secondary | ICD-10-CM

## 2020-06-28 DIAGNOSIS — M79671 Pain in right foot: Secondary | ICD-10-CM | POA: Diagnosis not present

## 2020-06-28 DIAGNOSIS — Z981 Arthrodesis status: Secondary | ICD-10-CM

## 2020-06-28 DIAGNOSIS — M1990 Unspecified osteoarthritis, unspecified site: Secondary | ICD-10-CM

## 2020-06-28 DIAGNOSIS — G609 Hereditary and idiopathic neuropathy, unspecified: Secondary | ICD-10-CM | POA: Diagnosis not present

## 2020-06-28 DIAGNOSIS — R200 Anesthesia of skin: Secondary | ICD-10-CM | POA: Diagnosis not present

## 2020-06-28 DIAGNOSIS — M79641 Pain in right hand: Secondary | ICD-10-CM | POA: Diagnosis not present

## 2020-06-28 DIAGNOSIS — R202 Paresthesia of skin: Secondary | ICD-10-CM | POA: Diagnosis not present

## 2020-06-28 DIAGNOSIS — M79672 Pain in left foot: Secondary | ICD-10-CM | POA: Diagnosis not present

## 2020-06-28 NOTE — Progress Notes (Signed)
Office Visit Note  Patient: Brenda Taylor             Date of Birth: 1970/11/20           MRN: [id number]             PCP: Lindell Spar, MD Referring: Lindell Spar, MD Visit Date: 06/28/2020 Occupation: [employer]  Subjective:  Pain and Edema of the Right Foot, Pain and Edema of the Right Hand, Edema and Pain of the Left Hand, New Patient (Initial Visit), and Joint Pain   History of Present Illness: Brenda Taylor is a 49 y.o. female with a history of hypertension, chronic back and neck pain, and peripheral neuropathy here for evaluation of worsening pain and swelling in the right arm and right foot over the past 6 months. She has had longstanding pain issues with cervical and lumbar degenerative disease with 5 prior surgeries but had been generally well controlled. However now she is having progressive worsening of pain but also weakness in the right arm and hand. 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 She also describes severe pain in the lateral half of the right foot that often worsens when standing on it. She has stiffness and pain throughout the day somewhat worse with prolonged use. She has no noticed much benefit with NSAIDs. She has poor mood, fatigue, and difficulty concentrating. Previous workup by her PCP included negative rheumatoid factor, CCP, sedimentation rate, and ANA.   Activities of Daily Living:  Patient reports morning stiffness for 24 hours.   Patient Reports nocturnal pain.  Difficulty dressing/grooming: Reports Difficulty climbing stairs: Reports Difficulty getting out of chair: Reports Difficulty using hands for taps, buttons, cutlery, and/or writing: Reports  Review of Systems  Constitutional: Positive for fatigue.  HENT: Negative for mouth sores, mouth dryness and nose dryness.   Eyes: Positive for dryness. Negative for pain, itching and visual disturbance.  Respiratory: Positive for  cough. Negative for hemoptysis, shortness of breath and difficulty breathing.   Cardiovascular: Negative for chest pain, palpitations and swelling in legs/feet.  Gastrointestinal: Negative for abdominal pain, blood in stool, constipation and diarrhea.  Endocrine: Negative for increased urination.  Genitourinary: Negative for painful urination.  Musculoskeletal: Positive for arthralgias, joint pain, joint swelling, myalgias, muscle weakness, morning stiffness, muscle tenderness and myalgias.  Skin: Positive for rash and redness. Negative for color change.  Allergic/Immunologic: Negative for susceptible to infections.  Neurological: Positive for dizziness and memory loss. Negative for numbness, headaches and weakness.  Hematological: Negative for swollen glands.  Psychiatric/Behavioral: Positive for confusion and sleep disturbance.    PMFS History:  Patient Active Problem List   Diagnosis Date Noted  . Right hand weakness 06/28/2020  . History of fusion of cervical spine 06/28/2020  . Arthritis 06/01/2020  . Idiopathic peripheral neuropathy 06/01/2020  . Hearing problem of right ear 05/02/2020  . Obesity (BMI 30-39.9) 05/02/2020  . Prediabetes 05/02/2020  . Numbness and tingling in both hands 08/06/2019  . Anxiety 05/07/2019  . Hypertension 04/23/2019  . HNP (herniated nucleus pulposus), lumbar 07/03/2013  . Unspecified constipation 10/31/2012    Past Medical History:  Diagnosis Date  . Anxiety   . Chronic pain   . DDD (degenerative disc disease)   . HBP (high blood pressure)   . Hernia   . Rotator cuff syndrome of left shoulder 10/09/2011  . Ulcer disease  Family History  Problem Relation Age of Onset  . Heart disease Other   . Arthritis Other   . Cancer Other   . Asthma Other   . Diabetes Other   . Heart disease Mother   . Diabetes Mother   . Cancer Mother   . Non-Hodgkin's lymphoma Mother   . Hypertension Mother   . Arthritis Mother   . Lung cancer Father   .  Neuropathy Father   . Hypertension Father    Past Surgical History:  Procedure Laterality Date  . APPENDECTOMY    . BACK SURGERY     x 4  . CARPAL TUNNEL RELEASE     bilateral  . CHOLECYSTECTOMY    . COLONOSCOPY  10/20/2009   DXI:PJASNKN anal canal/left-side diverticula and multiple polyps. poor prep compromised exam. Next TCS 09/2014.  Marland Kitchen ESOPHAGOGASTRODUODENOSCOPY  03/06/2010   LZJ:QBHA quadrant distal esophageal/patent tubular esophagus/small HH, antral erosions and ulcerations. Bx negative for persistent H.pylori  . ESOPHAGOGASTRODUODENOSCOPY  10/20/2009   LPF:XTKWI HH/prepyloric antral ulcer s/p bx (+h.pylori), ERE  . INTRAUTERINE DEVICE INSERTION    . MASTECTOMY, PARTIAL     x 2  . MOUTH SURGERY    . ROTATOR CUFF REPAIR     right  . TUBAL LIGATION     Social History   Social History Narrative  . Not on file   Immunization History  Administered Date(s) Administered  . Influenza,inj,Quad PF,6+ Mos 05/02/2020  . Tdap 03/19/2013     Objective: Vital Signs: BP (!) 144/86 (BP Location: Left Arm, Patient Position: Sitting, Cuff Size: Normal)   Pulse 87   Ht 5\' 6"  (1.676 m)   Wt 256 lb (116.1 kg)   BMI 41.32 kg/m    Physical Exam Constitutional:      Appearance: She is obese.  HENT:     Right Ear: External ear normal.     Left Ear: External ear normal.     Mouth/Throat:     Mouth: Mucous membranes are moist.     Pharynx: Oropharynx is clear.  Eyes:     Conjunctiva/sclera: Conjunctivae normal.  Cardiovascular:     Rate and Rhythm: Normal rate and regular rhythm.  Pulmonary:     Effort: Pulmonary effort is normal.     Breath sounds: Normal breath sounds.  Skin:    General: Skin is warm and dry.     Findings: No rash.  Neurological:     Mental Status: She is alert.  Psychiatric:     Comments: Tearful while discussing symptom impairment of work and activities     Musculoskeletal Exam:  Neck slightly reduced range of motion extension Shoulder, elbow, wrist,  fingers full range of motion tenderness over MCP joints of right hand no appreciable swelling warmth or erythema Knees full ROM Right 1st MTP severely reduced dorsiflexion, tenderness to percussion below the lateral malleolus, increased pronation with observed gait    Investigation: No additional findings.  Imaging: XR Foot 2 Views Left  Result Date: 06/28/2020 X-ray left foot 2 views Normal tibiotalar joint space and alignment.  No significant midfoot joint changes.  First MTP joint slightly increased subchondral sclerosis of proximal phalanx and lateral margin osteophytes but joint space preserved.  Cystic change in proximal phalanx.  MTP joints 2 through 5 appear normal.  Plantar calcaneal enthesophyte seen.  No soft tissue swelling seen. Impression Mild first MTP osteoarthritis no inflammatory abnormality seen  XR Foot 2 Views Right  Result Date: 06/28/2020 X-ray right foot 2 views  Normal tibiotalar joint space and alignment.  Marginal osteophytes seen at midfoot joints.  Diminished joint space with increased subchondral sclerosis and lateral osteophyte formation at first MTP joint with soft tissue increased opacity on medial aspect.  Other MTP joints appear normal.  Medial first digit interphalangeal joint OA.  Plantar and posterior calcaneal enthesophytes present.  No soft tissue swelling seen. 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  MCP, PIP, and DIP joint spaces are normal.  Few cystic changes in metacarpal heads 2 3 and 5.  Bone mineralization appears normal.  No soft tissue swelling seen. Impression No significant degenerative inflammatory abnormalities seen  XR Hand 2 View Right  Result Date: 06/28/2020 X-ray right hand 2 views Radiocarpal joint possible change in  head of scaphoid otherwise normal.  Carpal joints appear normal.  Normal MCP, PIP, DIP joint spaces are preserved but probable early osteophytes at margins of DIP joints.  Bone mineralization appears normal.  No soft tissue swelling is seen. Impression Early degenerative arthritis no inflammatory abnormality seen   Recent Labs: Lab Results  Component Value Date   WBC 13.3 (H) 05/04/2020   HGB 12.9 05/04/2020   PLT 301 05/04/2020   NA 139 05/04/2020   K 4.4 05/04/2020   CL 101 05/04/2020   CO2 24 05/04/2020   GLUCOSE 102 (H) 05/04/2020   BUN 19 05/04/2020   CREATININE 0.88 05/04/2020   BILITOT <0.2 05/04/2020   ALKPHOS 101 05/04/2020   AST 17 05/04/2020   ALT 17 05/04/2020   PROT 7.5 05/04/2020   ALBUMIN 4.3 05/04/2020   CALCIUM 9.5 05/04/2020   GFRAA 88 05/04/2020    Speciality Comments: No specialty comments available.  Procedures:  No procedures performed Allergies: Amoxicillin and Aspirin   Assessment / Plan:     Visit Diagnoses: Arthritis, Numbness and tingling in both hands, Idiopathic peripheral neuropathy, Right hand weakness, History of fusion of cervical spine - Plan: XR Hand 2 View Right, XR Hand 2 View Left, XR Foot 2 Views Right, XR Foot 2 Views Left, CK, Uric acid  She has pain more severe over proximal hand joints but no swelling observed on exam or with limited ultrasound exam today. Grip strength is decreased without visible thenar or forearm atrophy. Xrays show degenerative changes without focal osteopenia or erosion. Seronegative RA seems unlikely. Will evaluate for uric acid elevation or CK for alternate causes of inflammatory arthritis or weakness. IF normal would consider referral for nerve conduction testing in right arm since asymmetric weakness seems unusual for her otherwise seemingly mild peripheral neuropathy.  Right foot pain  Lateral foot pain is consistent with tarsal tunnel syndrome. Decreased 1st MTP mobility and overpronation plus obesity and  tight work shoes all discussed as probably contributors. Exercises provided to start trying today.  Orders: Orders Placed This Encounter  Procedures  . XR Hand 2 View Right  . XR Hand 2 View Left  . XR Foot 2 Views Right  . XR Foot 2 Views Left  . CK  . Uric acid   No orders of the defined types were placed in this encounter.   Follow-Up Instructions: Return in about 1 week (around 07/05/2020) for Hand and foot pain, weakness.   Collier Salina, MD  Note - This record has been created using [employer].  Chart creation errors have been sought, but may not always  have been located. Such creation errors do not reflect on  the standard of medical care.

## 2020-06-28 NOTE — Patient Instructions (Addendum)
Tarsal Tunnel Syndrome  Tarsal tunnel syndrome is a condition that happens when a nerve called the tibial nerve is irritated or squeezed (compressed) as it passes through an area on the inside of your ankle (tarsal tunnel). The tarsal tunnel is a narrow passage through the connective tissue and bones in your feet (tarsals). The tibial nerve passes behind the large bony bump at your inner ankle (medial malleolus) and sends branches to your foot and toes. This nerve enables feeling by passing signals to your heel, the bottom of your foot, and some of your toe muscles. Tarsal tunnel syndrome usually causes ankle and foot pain that gets worse with activity. What are the causes? Tarsal tunnel syndrome can be caused by any condition that narrows the space in the tarsal tunnel. Athletes may get tarsal tunnel syndrome from a fractured ankle or from an outward (eversion) ankle sprain that results in scarring or swelling. Other common causes include:  Overpronation. This is when your feet roll inward and flatten too much when you stand, walk, or run.  Extra pressure on the tarsal tunnel area from tight or stiff shoes or boots.  Decreased room in the tarsal tunnel due to small, fluid-filled sacs (cysts) or growths on the bones near the tunnel (exostosis). What increases the risk? This condition is more likely to develop in people who:  Play sports where they wear high, stiff boots, such as downhill skiing.  Play sports with repetitive motion, such as running.  Play sports on uneven surfaces that can lead to a sprained ankle, such as soccer or football.  Have had an inner ankle injury.  Have flat feet.  Have other conditions, such as diabetes, hypothyroidism, or rheumatoid arthritis. What are the signs or symptoms? Symptoms of this condition include:  Burning pain behind the ankle, in the heel, or in the foot. This pain gets worse if you are standing, walking, or running.  Numbness or a  prickling and tingling sensation in your heel, foot, or toes.  Swelling in your ankle or heel. At first, your symptoms may get worse with activity and be relieved with rest. Over time, your symptoms may become constant or come on sooner with less activity. How is this diagnosed? This condition is diagnosed based on:  Your symptoms.  Your medical history.  A physical exam. Your health care provider may tap on the area below your ankle to check for tingling in your foot or toes.  You may also have other tests, including: ? X-rays to check bone structure. ? An MRI or ultrasound to examine nerve and tendon structures and find where your nerve is getting compressed. ? A study of nerve function (electromyography or EMG). How is this treated? Treatment may include:  Wearing a removable splint or boot for ankle support.  Using a shoe insert (orthotic) to help support your arch.  Taking NSAIDs to reduce pain.  Using ice to reduce swelling.  Having medicine injected into your ankle joint to reduce pain and swelling.  Starting range-of-motion exercises and strengthening exercises.  Gradually returning to full activity. The timing will depend on the severity of your condition and your response to treatment.  Surgery. Follow these instructions at home: If you have a splint or boot:  Wear the splint or boot as told by your health care provider. Remove it only as told by your health care provider.  Loosen the splint or boot if your toes tingle, become numb, or turn cold and blue.  Keep the splint or boot clean.  If your splint or boot is not waterproof: ? Do not let it get wet. ? Cover it with a watertight covering when you take a bath or shower.  Ask your health care provider when it is safe to drive if your splint or boot is on a foot that you use for driving. Managing pain, stiffness, and swelling   If directed, put ice on the injured area. ? If you have a removable splint or  boot, remove it as told by your health care provider. ? Put ice in a plastic bag. ? Place a towel between your skin and the bag. ? Leave the ice on for 20 minutes, 2-3 times a day.  Move your toes often to reduce stiffness and swelling.  Raise (elevate) your foot above the level of your heart while you are sitting or lying down. Activity  Return to your normal activities as told by your health care provider. Ask your health care provider what activities are safe for you.  Do exercises as told by your health care provider. General instructions  Take over-the-counter and prescription medicines only as told by your health care provider.  Do not use any products that contain nicotine or tobacco, such as cigarettes, e-cigarettes, and chewing tobacco. These can delay healing. If you need help quitting, ask your health care provider.  Keep all follow-up visits as told by your health care provider. This is important. How is this prevented?  Give your body time to rest between periods of activity.  Make sure to wear supportive and comfortable shoes during athletic activity.  Do not overtighten ski boots or the laces on high-top shoes.  Be safe and responsible while being active to avoid falls. Contact a health care provider if:  Your ankle pain is not getting better.  You are unable to support (bear) body weight on your foot without pain. Summary  Tarsal tunnel syndrome is a condition that happens when a nerve called the tibial nerve is irritated or squeezed (compressed) as it passes through an area on the inside of your ankle (tarsal tunnel).  Tarsal tunnel syndrome usually causes ankle and foot pain that gets worse with activity.  This condition may be treated with a splint or boot, shoe inserts, ice, exercises, medicines, and surgery if needed.  Follow your health care provider's instructions for caring for your splint or boot.  Contact your health care provider if your ankle  pain is not getting better, or if you are unable to bear your body weight without pain.     Tarsal Tunnel Syndrome Rehab Ask your health care provider which exercises are safe for you. Do exercises exactly as told by your health care provider and adjust them as directed. It is normal to feel mild stretching, pulling, tightness, or discomfort as you do these exercises. Stop right away if you feel sudden pain or your pain gets worse. Do not begin these exercises until told by your health care provider. Stretching and range-of-motion exercises These exercises warm up your muscles and joints and improve the movement and flexibility of your foot. These exercises also help to relieve pain, numbness, and tingling. Gastrocnemius stretch, standing This exercise is also called a calf stretch. It stretches the muscles in the back of the lower leg (gastrocnemius). 1. Stand with your hands against a wall. 2. Extend your left / right leg behind you, and bend your front knee slightly. Your heels should be on the floor.  3. Keeping your heels on the floor and your back knee straight, shift your weight toward the wall. Do not arch your back. You should feel a gentle stretch in the back of your lower leg (calf). 4. Hold this position for 15 seconds. 5. Return to the starting position. Repeat 3 times. Complete this exercise daily. Tibial nerve glide 1. Sit on a stable chair with both feet on the floor. 2. Clasp your hands together behind your back. Gently round your back and tuck your chin toward your chest. 3. Slowly straighten your knee as far as you can without increasing your symptoms. 4. Turn your left / right foot so that your toes are pointing outward. 5. Slowly tip your toes toward your shin. 6. Hold this position for 15 seconds. 7. Slowly return to the starting position. Repeat 3 times. Complete this exercise daily

## 2020-06-29 LAB — CK: Total CK: 86 U/L (ref 29–143)

## 2020-06-29 LAB — URIC ACID: Uric Acid, Serum: 6 mg/dL (ref 2.5–7.0)

## 2020-07-05 ENCOUNTER — Encounter: Payer: Self-pay | Admitting: Internal Medicine

## 2020-07-05 ENCOUNTER — Other Ambulatory Visit: Payer: Self-pay

## 2020-07-05 ENCOUNTER — Ambulatory Visit (INDEPENDENT_AMBULATORY_CARE_PROVIDER_SITE_OTHER): Payer: 59 | Admitting: Internal Medicine

## 2020-07-05 VITALS — BP 114/64 | HR 98 | Ht 68.0 in | Wt 258.0 lb

## 2020-07-05 DIAGNOSIS — R29898 Other symptoms and signs involving the musculoskeletal system: Secondary | ICD-10-CM

## 2020-07-05 DIAGNOSIS — R200 Anesthesia of skin: Secondary | ICD-10-CM

## 2020-07-05 DIAGNOSIS — R202 Paresthesia of skin: Secondary | ICD-10-CM

## 2020-07-05 DIAGNOSIS — Z981 Arthrodesis status: Secondary | ICD-10-CM | POA: Diagnosis not present

## 2020-07-05 DIAGNOSIS — M19071 Primary osteoarthritis, right ankle and foot: Secondary | ICD-10-CM

## 2020-07-05 NOTE — Progress Notes (Signed)
Office Visit Note  Patient: Brenda Taylor             Date of Birth: 11/26/1970           MRN: [id number]             PCP: Lindell Spar, MD Referring: Lindell Spar, MD Visit Date: 07/05/2020 Occupation: [employer]  Subjective:   History of Present Illness: [name] is a 49 y.o. female here for follow up of pain in hands and feet and right arm weakness. Additional workup showed a normal CK and uric acid was 6.0. Since last visit she has noticed no significant change in symptoms. She continues to have pain in her feet worst on right. She is still having trouble gripping tightly with the right hand.   Review of Systems  Constitutional: Positive for fatigue.  HENT: Positive for mouth dryness. Negative for mouth sores and nose dryness.   Eyes: Positive for visual disturbance. Negative for pain, itching and dryness.  Respiratory: Positive for cough. Negative for hemoptysis, shortness of breath and difficulty breathing.   Cardiovascular: Negative for chest pain, palpitations and swelling in legs/feet.  Gastrointestinal: Negative for abdominal pain, blood in stool, constipation and diarrhea.  Endocrine: Negative for increased urination.  Genitourinary: Negative for painful urination.  Musculoskeletal: Positive for arthralgias, joint pain, joint swelling, myalgias, muscle weakness, morning stiffness, muscle tenderness and myalgias.  Skin: Positive for rash. Negative for color change and redness.  Allergic/Immunologic: Negative for susceptible to infections.  Neurological: Positive for dizziness. Negative for numbness, headaches, memory loss and weakness.  Hematological: Negative for swollen glands.  Psychiatric/Behavioral: Positive for sleep disturbance. Negative for confusion.    PMFS History:  Patient Active Problem List   Diagnosis Date Noted  . Osteoarthritis of right foot 07/05/2020  . Right hand weakness 06/28/2020  . History of fusion of cervical spine 06/28/2020  .  Arthritis 06/01/2020  . Idiopathic peripheral neuropathy 06/01/2020  . Hearing problem of right ear 05/02/2020  . Obesity (BMI 30-39.9) 05/02/2020  . Prediabetes 05/02/2020  . Numbness and tingling in both hands 08/06/2019  . Anxiety 05/07/2019  . Hypertension 04/23/2019  . HNP (herniated nucleus pulposus), lumbar 07/03/2013  . Unspecified constipation 10/31/2012    Past Medical History:  Diagnosis Date  . Anxiety   . Chronic pain   . DDD (degenerative disc disease)   . HBP (high blood pressure)   . Hernia   . Rotator cuff syndrome of left shoulder 10/09/2011  . Ulcer disease     Family History  Problem Relation Age of Onset  . Heart disease Other   . Arthritis Other   . Cancer Other   . Asthma Other   . Diabetes Other   . Heart disease Mother   . Diabetes Mother   . Cancer Mother   . Non-Hodgkin's lymphoma Mother   . Hypertension Mother   . Arthritis Mother   . Lung cancer Father   . Neuropathy Father   . Hypertension Father    Past Surgical History:  Procedure Laterality Date  . APPENDECTOMY    . BACK SURGERY     x 4  . CARPAL TUNNEL RELEASE     bilateral  . CHOLECYSTECTOMY    . COLONOSCOPY  10/20/2009   RXV:QMGQQPY anal canal/left-side diverticula and multiple polyps. poor prep compromised exam. Next TCS 09/2014.  Marland Kitchen ESOPHAGOGASTRODUODENOSCOPY  03/06/2010   PPJ:KDTO quadrant distal esophageal/patent tubular esophagus/small HH, antral erosions and ulcerations. Bx negative  for persistent H.pylori  . ESOPHAGOGASTRODUODENOSCOPY  10/20/2009   LZJ:QBHAL HH/prepyloric antral ulcer s/p bx (+h.pylori), ERE  . INTRAUTERINE DEVICE INSERTION    . MASTECTOMY, PARTIAL     x 2  . MOUTH SURGERY    . ROTATOR CUFF REPAIR     right  . TUBAL LIGATION     Social History   Social History Narrative  . Not on file   Immunization History  Administered Date(s) Administered  . Influenza,inj,Quad PF,6+ Mos 05/02/2020  . Tdap 03/19/2013     Objective: Vital Signs: BP 114/64  (BP Location: Right Arm, Patient Position: Sitting, Cuff Size: Normal)   Pulse 98   Ht 5\' 8"  (1.727 m)   Wt 258 lb (117 kg)   BMI 39.23 kg/m      Musculoskeletal Exam:  Right hand grip strength decreased especially apposition of thumb and small finger, negative tinels and phalens test Right 1st MTP severely reduced ROM no synovitis no MTP squeeze tenderness   Investigation: No additional findings.  Imaging: XR Foot 2 Views Left  Result Date: 06/28/2020 X-ray left foot 2 views Normal tibiotalar joint space and alignment.  No significant midfoot joint changes.  First MTP joint slightly increased subchondral sclerosis of proximal phalanx and lateral margin osteophytes but joint space preserved.  Cystic change in proximal phalanx.  MTP joints 2 through 5 appear normal.  Plantar calcaneal enthesophyte seen.  No soft tissue swelling seen. Impression Mild first MTP osteoarthritis no inflammatory abnormality seen  XR Foot 2 Views Right  Result Date: 06/28/2020 X-ray right foot 2 views Normal tibiotalar joint space and alignment.  Marginal osteophytes seen at midfoot joints.  Diminished joint space with increased subchondral sclerosis and lateral osteophyte formation at first MTP joint with soft tissue increased opacity on medial aspect.  Other MTP joints appear normal.  Medial first digit interphalangeal joint OA.  Plantar and posterior calcaneal enthesophytes present.  No soft tissue swelling seen. 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  MCP, PIP, and DIP joint spaces are normal.  Few cystic changes in metacarpal heads 2 3 and 5.  Bone mineralization appears normal.  No soft tissue swelling seen. Impression No significant degenerative inflammatory abnormalities seen  XR Hand 2  View Right  Result Date: 06/28/2020 X-ray right hand 2 views Radiocarpal joint possible change in head of scaphoid otherwise normal.  Carpal joints appear normal.  Normal MCP, PIP, DIP joint spaces are preserved but probable early osteophytes at margins of DIP joints.  Bone mineralization appears normal.  No soft tissue swelling is seen. Impression Early degenerative arthritis no inflammatory abnormality seen   Recent Labs: Lab Results  Component Value Date   WBC 13.3 (H) 05/04/2020   HGB 12.9 05/04/2020   PLT 301 05/04/2020   NA 139 05/04/2020   K 4.4 05/04/2020   CL 101 05/04/2020   CO2 24 05/04/2020   GLUCOSE 102 (H) 05/04/2020   BUN 19 05/04/2020   CREATININE 0.88 05/04/2020   BILITOT <0.2 05/04/2020   ALKPHOS 101 05/04/2020   AST 17 05/04/2020   ALT 17 05/04/2020   PROT 7.5 05/04/2020   ALBUMIN 4.3 05/04/2020   CALCIUM 9.5 05/04/2020   GFRAA 88 05/04/2020    Speciality Comments: No specialty comments available.  Procedures:  No procedures performed Allergies: Amoxicillin  and Aspirin   Assessment / Plan:     Visit Diagnoses: Right hand weakness - Plan: Ambulatory referral to Physical Medicine Rehab Numbness and tingling in both hands History of fusion of cervical spine  Weakness in the right arm does not appear to be inflammatory normal CK and asymmetric distribution. She has prior history of cervical radiculopathy and pervious fusion but also CTS with prior release surgery. I believe she will benefit with testing for impingement or other conduction defect. No further inflammatory workup recommended.  Primary osteoarthritis of right foot - Plan: Ambulatory referral to Podiatry  I believe lateral foot pain is being worsened by her severe 1st MTP OA limited ROM and gait. Referral to podiatry for evaluation for surgery or orthotics for this.  Orders: Orders Placed This Encounter  Procedures  . Ambulatory referral to Physical Medicine Rehab  . Ambulatory referral to  Podiatry   No orders of the defined types were placed in this encounter.   Follow-Up Instructions: No follow-ups on file.   Collier Salina, MD  Note - This record has been created using [employer].  Chart creation errors have been sought, but may not always  have been located. Such creation errors do not reflect on  the standard of medical care.

## 2020-07-05 NOTE — Patient Instructions (Signed)
We will be referring you for neurology evaluation of the hand weakness and podiatrist to take a look at the bunion and osteoarthritis in your right foot. If you do not hear from anyone in a timely,manner please let us know.

## 2020-07-06 ENCOUNTER — Telehealth: Payer: Self-pay

## 2020-07-06 NOTE — Telephone Encounter (Signed)
Patient called she is returning your phone call about scheduling a appointment with Dr.Newton.CB:[phone]

## 2020-07-06 NOTE — Telephone Encounter (Signed)
Called pt back and sch 08/08/20.

## 2020-07-12 ENCOUNTER — Ambulatory Visit (INDEPENDENT_AMBULATORY_CARE_PROVIDER_SITE_OTHER): Payer: 59 | Admitting: Podiatry

## 2020-07-12 ENCOUNTER — Other Ambulatory Visit: Payer: Self-pay | Admitting: Internal Medicine

## 2020-07-12 ENCOUNTER — Other Ambulatory Visit: Payer: Self-pay

## 2020-07-12 ENCOUNTER — Ambulatory Visit: Payer: 59

## 2020-07-12 DIAGNOSIS — M722 Plantar fascial fibromatosis: Secondary | ICD-10-CM | POA: Diagnosis not present

## 2020-07-12 DIAGNOSIS — I10 Essential (primary) hypertension: Secondary | ICD-10-CM

## 2020-07-12 DIAGNOSIS — M778 Other enthesopathies, not elsewhere classified: Secondary | ICD-10-CM

## 2020-07-12 MED ORDER — TRIAMCINOLONE ACETONIDE 40 MG/ML IJ SUSP: 40.0000 mg | Freq: Once | INTRAMUSCULAR | Status: DC

## 2020-07-12 MED ORDER — METHYLPREDNISOLONE 4 MG PO TBPK: ORAL_TABLET | ORAL | 0 refills | Status: DC

## 2020-07-12 NOTE — Patient Instructions (Signed)
Plantar Fasciitis Rehab Ask your health care provider which exercises are safe for you. Do exercises exactly as told by your health care provider and adjust them as directed. It is normal to feel mild stretching, pulling, tightness, or discomfort as you do these exercises. Stop right away if you feel sudden pain or your pain gets worse. Do not begin these exercises until told by your health care provider. Stretching and range-of-motion exercises These exercises warm up your muscles and joints and improve the movement and flexibility of your foot. These exercises also help to relieve pain. Plantar fascia stretch  1. Sit with your left / right leg crossed over your opposite knee. 2. Hold your heel with one hand with that thumb near your arch. With your other hand, hold your toes and gently pull them back toward the top of your foot. You should feel a stretch on the bottom of your toes or your foot (plantar fascia) or both. 3. Hold this stretch for__________ seconds. 4. Slowly release your toes and return to the starting position. Repeat __________ times. Complete this exercise __________ times a day. Gastrocnemius stretch, standing This exercise is also called a calf (gastroc) stretch. It stretches the muscles in the back of the upper calf. 1. Stand with your hands against a wall. 2. Extend your left / right leg behind you, and bend your front knee slightly. 3. Keeping your heels on the floor and your back knee straight, shift your weight toward the wall. Do not arch your back. You should feel a gentle stretch in your upper left / right calf. 4. Hold this position for __________ seconds. Repeat __________ times. Complete this exercise __________ times a day. Soleus stretch, standing This exercise is also called a calf (soleus) stretch. It stretches the muscles in the back of the lower calf. 1. Stand with your hands against a wall. 2. Extend your left / right leg behind you, and bend your front  knee slightly. 3. Keeping your heels on the floor, bend your back knee and shift your weight slightly over your back leg. You should feel a gentle stretch deep in your lower calf. 4. Hold this position for __________ seconds. Repeat __________ times. Complete this exercise __________ times a day. Gastroc and soleus stretch, standing step This exercise stretches the muscles in the back of the lower leg. These muscles are in the upper calf (gastrocnemius) and the lower calf (soleus). 1. Stand with the ball of your left / right foot on a step. The ball of your foot is on the walking surface, right under your toes. 2. Keep your other foot firmly on the same step. 3. Hold on to the wall or a railing for balance. 4. Slowly lift your other foot, allowing your body weight to press your left / right heel down over the edge of the step. You should feel a stretch in your left / right calf. 5. Hold this position for __________ seconds. 6. Return both feet to the step. 7. Repeat this exercise with a slight bend in your left / right knee. Repeat __________ times with your left / right knee straight and __________ times with your left / right knee bent. Complete this exercise __________ times a day. Balance exercise This exercise builds your balance and strength control of your arch to help take pressure off your plantar fascia. Single leg stand If this exercise is too easy, you can try it with your eyes closed or while standing on a pillow. 1.   Without shoes, stand near a railing or in a doorway. You may hold on to the railing or door frame as needed. 2. Stand on your left / right foot. Keep your big toe down on the floor and try to keep your arch lifted. Do not let your foot roll inward. 3. Hold this position for __________ seconds. Repeat __________ times. Complete this exercise __________ times a day. This information is not intended to replace advice given to you by your health care provider. Make sure  you discuss any questions you have with your health care provider. Document Revised: 10/30/2018 Document Reviewed: 05/07/2018 Elsevier Patient Education  [place].

## 2020-07-13 NOTE — Progress Notes (Signed)
Subjective:  Patient ID: Vonna Drafts, female    DOB: 1971/06/13,  MRN: [id number] HPI Chief Complaint  Patient presents with  . Pain    Rt hallux joint and lateral foot pain x 3 mo; 7/10 constant shapr pains- no injury w/ burning -wrose with pressure or standing Tx: heat, icing and soakcings    49 y.o. female presents with the above complaint.   ROS: Denies fever chills nausea vomiting muscle aches pains calf pain back pain chest pain shortness of breath.  Past Medical History:  Diagnosis Date  . Anxiety   . Chronic pain   . DDD (degenerative disc disease)   . HBP (high blood pressure)   . Hernia   . Rotator cuff syndrome of left shoulder 10/09/2011  . Ulcer disease    Past Surgical History:  Procedure Laterality Date  . APPENDECTOMY    . BACK SURGERY     x 4  . CARPAL TUNNEL RELEASE     bilateral  . CHOLECYSTECTOMY    . COLONOSCOPY  10/20/2009   RSW:NIOEVOJ anal canal/left-side diverticula and multiple polyps. poor prep compromised exam. Next TCS 09/2014.  Marland Kitchen ESOPHAGOGASTRODUODENOSCOPY  03/06/2010   JKK:XFGH quadrant distal esophageal/patent tubular esophagus/small HH, antral erosions and ulcerations. Bx negative for persistent H.pylori  . ESOPHAGOGASTRODUODENOSCOPY  10/20/2009   WEX:HBZJI HH/prepyloric antral ulcer s/p bx (+h.pylori), ERE  . INTRAUTERINE DEVICE INSERTION    . MASTECTOMY, PARTIAL     x 2  . MOUTH SURGERY    . ROTATOR CUFF REPAIR     right  . TUBAL LIGATION      Current Outpatient Medications:  .  benzonatate (TESSALON) 100 MG capsule, TAKE 1 CAPSULE BY MOUTH 2 TIMES DAILY AS NEEDED FOR COUGH., Disp: 30 capsule, Rfl: 0 .  busPIRone (BUSPAR) 15 MG tablet, Take 1 tablet (15 mg total) by mouth 3 (three) times daily., Disp: 90 tablet, Rfl: 1 .  fluticasone (FLONASE) 50 MCG/ACT nasal spray, Place 1 spray into both nostrils daily for 14 days., Disp: 16 g, Rfl: 0 .  gabapentin (NEURONTIN) 100 MG capsule, Take 1 capsule (100 mg total) by mouth 2 (two) times  daily., Disp: 60 capsule, Rfl: 2 .  hydrochlorothiazide (HYDRODIURIL) 25 MG tablet, TAKE 1 TABLET BY MOUTH ONCE A DAY., Disp: 30 tablet, Rfl: 3 .  lisinopril (ZESTRIL) 40 MG tablet, Take 1 tablet (40 mg total) by mouth daily., Disp: 90 tablet, Rfl: 3 .  methylPREDNISolone (MEDROL DOSEPAK) 4 MG TBPK tablet, 6 day dose pack - take as directed, Disp: 21 tablet, Rfl: 0  Current Facility-Administered Medications:  .  triamcinolone acetonide (KENALOG-40) injection 40 mg, 40 mg, Other, Once, Samit Sylve T, DPM  Allergies  Allergen Reactions  . Amoxicillin     Reaction is unknown  . Aspirin     Cannot take due to ulcers   Review of Systems Objective:  There were no vitals filed for this visit.  General: Well developed, nourished, in no acute distress, alert and oriented x3   Dermatological: Skin is warm, dry and supple bilateral. Nails x 10 are well maintained; remaining integument appears unremarkable at this time. There are no open sores, no preulcerative lesions, no rash or signs of infection present.  Vascular: Dorsalis Pedis artery and Posterior Tibial artery pedal pulses are 2/4 bilateral with immedate capillary fill time. Pedal hair growth present. No varicosities and no lower extremity edema present bilateral.   Neruologic: Grossly intact via light touch bilateral. Vibratory intact via tuning fork bilateral.  Protective threshold with Semmes Wienstein monofilament intact to all pedal sites bilateral. Patellar and Achilles deep tendon reflexes 2+ bilateral. No Babinski or clonus noted bilateral.   Musculoskeletal: No gross boney pedal deformities bilateral. No pain, crepitus, or limitation noted with foot and ankle range of motion bilateral. Muscular strength 5/5 in all groups tested bilateral.  She has pain on range of motion of the first metatarsophalangeal joint with limitation of dorsiflexion.  There is no crepitation noted.  She has pain on direct palpation medial calcaneal tubercle of  the right heel she has no pain on medial lateral compression of the calcaneus however.  She also has pain on palpation to the fourth fifth tarsometatarsal joint with some overlying edema.  Gait: Unassisted, Nonantalgic.    Radiographs:  Radiographs were taken previously but I reviewed those today which do demonstrate bone-on-bone contact complete joint space loss of the first metatarsophalangeal joint right.  She still has a plantar distal aortic calcaneal heel spur and soft tissue increase in density around that of the soft tissue consistent with plantar fasciitis.  There is no fracture and no osteoarthritic changes to the lateral aspect of the foot  Assessment & Plan:   Assessment: Plantar fasciitis with lateral compensatory syndrome resulting in TMT joint pain of the fourth and fifth.  Severe hallux limitus, severe osteoarthritis.  Plan: Discussed etiology pathology conservative versus surgical therapies.  At this point I feel that the best thing for her knowing her job and her general welfare is to go ahead and treat plantar fasciitis if we can get rid of this resolved as well as the lateral component of this foot pain.  We can then consider either injections to the first metatarsophalangeal joint surgical intervention which she states that she does not have time to take off for recovery and or see about getting her some orthotics made with a Morton's extension.     Chestine Belknap T. [place], [place]

## 2020-08-08 ENCOUNTER — Encounter: Payer: 59 | Admitting: Physical Medicine and Rehabilitation

## 2020-08-09 ENCOUNTER — Other Ambulatory Visit: Payer: Self-pay | Admitting: Internal Medicine

## 2020-08-10 ENCOUNTER — Encounter: Payer: 59 | Admitting: Physical Medicine and Rehabilitation

## 2020-08-16 ENCOUNTER — Ambulatory Visit: Payer: 59 | Admitting: Podiatry

## 2020-08-30 ENCOUNTER — Ambulatory Visit: Payer: 59 | Admitting: Podiatry

## 2020-08-31 ENCOUNTER — Encounter: Payer: Self-pay | Admitting: Internal Medicine

## 2020-08-31 ENCOUNTER — Other Ambulatory Visit: Payer: Self-pay

## 2020-08-31 ENCOUNTER — Telehealth (INDEPENDENT_AMBULATORY_CARE_PROVIDER_SITE_OTHER): Payer: Self-pay | Admitting: Internal Medicine

## 2020-08-31 VITALS — BP 112/67

## 2020-08-31 DIAGNOSIS — M1990 Unspecified osteoarthritis, unspecified site: Secondary | ICD-10-CM

## 2020-08-31 DIAGNOSIS — I10 Essential (primary) hypertension: Secondary | ICD-10-CM

## 2020-08-31 DIAGNOSIS — R7303 Prediabetes: Secondary | ICD-10-CM

## 2020-08-31 DIAGNOSIS — F419 Anxiety disorder, unspecified: Secondary | ICD-10-CM

## 2020-08-31 MED ORDER — HYDROCHLOROTHIAZIDE 25 MG PO TABS: 25.0000 mg | ORAL_TABLET | Freq: Every day | ORAL | 3 refills | Status: DC

## 2020-08-31 MED ORDER — ALPRAZOLAM 0.25 MG PO TABS
0.2500 mg | ORAL_TABLET | Freq: Every evening | ORAL | 2 refills | Status: DC | PRN
Start: 2020-08-31 — End: 2021-01-09

## 2020-08-31 NOTE — Assessment & Plan Note (Signed)
Uncontrolled with Buspar Having sleep difficulty Was tolerating Xanax well in the past Had discussion about potential side effects and addictive potential, patient understands and agrees to take it as prescribed. Started Xanax 0.25 mg qHS PRN after reviewing PDMP

## 2020-08-31 NOTE — Progress Notes (Signed)
Virtual Visit via Telephone Note   This visit type was conducted due to national recommendations for restrictions regarding the COVID-19 Pandemic (e.g. social distancing) in an effort to limit this patient's exposure and mitigate transmission in our community.  Due to her co-morbid illnesses, this patient is at least at moderate risk for complications without adequate follow up.  This format is felt to be most appropriate for this patient at this time.  The patient did not have access to video technology/had technical difficulties with video requiring transitioning to audio format only (telephone).  All issues noted in this document were discussed and addressed.  No physical exam could be performed with this format.  Evaluation Performed:  Follow-up visit  Date:  08/31/2020   ID:  Brenda Taylor, DOB 11-12-1970, MRN [id number]  Patient Location: Home Provider Location: Office/Clinic  Participants: Patient Location of Patient: Home Location of Provider: Telehealth Consent was obtain for visit to be over via telehealth. I verified that I am speaking with the correct person using two identifiers.  PCP:  Lindell Spar, MD   Chief Complaint:  Follow up of HTN and anxiety  History of Present Illness:    Brenda Taylor is a 50 y.o. female with  past medical history of hypertension, anxiety and colonic polyp who has a televisit for f/u of chronic medical conditions.  BP is well-controlled at home. Takes medications regularly. Patient denies headache, dizziness, chest pain, dyspnea or palpitations.  She c/o anxiety spells despite taking Buspar. She has insomnia and constant anxiety, worse at night. She denies anhedonia, suicidal or homicidal ideation. She had been on Xanax in the past.  She still has pain in her hands and follows up with Rheumatologist. She was prescribed steroid dosepak recently, which she has completed.  The patient does not have symptoms concerning for COVID-19 infection  (fever, chills, cough, or new shortness of breath).   Past Medical, Surgical, Social History, Allergies, and Medications have been Reviewed.  Past Medical History:  Diagnosis Date  . Anxiety   . Chronic pain   . DDD (degenerative disc disease)   . HBP (high blood pressure)   . Hernia   . Rotator cuff syndrome of left shoulder 10/09/2011  . Ulcer disease    Past Surgical History:  Procedure Laterality Date  . APPENDECTOMY    . BACK SURGERY     x 4  . CARPAL TUNNEL RELEASE     bilateral  . CHOLECYSTECTOMY    . COLONOSCOPY  10/20/2009   SHN:GITJLLV anal canal/left-side diverticula and multiple polyps. poor prep compromised exam. Next TCS 09/2014.  Marland Kitchen ESOPHAGOGASTRODUODENOSCOPY  03/06/2010   DIX:VEZB quadrant distal esophageal/patent tubular esophagus/small HH, antral erosions and ulcerations. Bx negative for persistent H.pylori  . ESOPHAGOGASTRODUODENOSCOPY  10/20/2009   MZT:AEWYB HH/prepyloric antral ulcer s/p bx (+h.pylori), ERE  . INTRAUTERINE DEVICE INSERTION    . MASTECTOMY, PARTIAL     x 2  . MOUTH SURGERY    . ROTATOR CUFF REPAIR     right  . TUBAL LIGATION       Current Meds  Medication Sig  . ALPRAZolam (XANAX) 0.25 MG tablet Take 1 tablet (0.25 mg total) by mouth at bedtime as needed for anxiety.  . benzonatate (TESSALON) 100 MG capsule TAKE 1 CAPSULE BY MOUTH 2 TIMES DAILY AS NEEDED FOR COUGH.  . busPIRone (BUSPAR) 15 MG tablet Take 1 tablet (15 mg total) by mouth 3 (three) times daily.  Marland Kitchen gabapentin (NEURONTIN) 100  MG capsule Take 1 capsule (100 mg total) by mouth 2 (two) times daily.  Marland Kitchen lisinopril (ZESTRIL) 40 MG tablet Take 1 tablet (40 mg total) by mouth daily.  . methylPREDNISolone (MEDROL DOSEPAK) 4 MG TBPK tablet 6 day dose pack - take as directed  . [DISCONTINUED] hydrochlorothiazide (HYDRODIURIL) 25 MG tablet TAKE 1 TABLET BY MOUTH ONCE A DAY.     Allergies:   Amoxicillin and Aspirin   ROS:   Please see the history of present illness.     All other  systems reviewed and are negative.   Labs/Other Tests and Data Reviewed:    Recent Labs: 05/04/2020: ALT 17; BUN 19; Creatinine, Ser 0.88; Hemoglobin 12.9; Platelets 301; Potassium 4.4; Sodium 139   Recent Lipid Panel Lab Results  Component Value Date/Time   CHOL 153 05/04/2020 03:07 PM   TRIG 97 05/04/2020 03:07 PM   HDL 32 (L) 05/04/2020 03:07 PM   CHOLHDL 4.8 (H) 05/04/2020 03:07 PM   LDLCALC 103 (H) 05/04/2020 03:07 PM    Wt Readings from Last 3 Encounters:  07/05/20 258 lb (117 kg)  06/28/20 256 lb (116.1 kg)  06/01/20 258 lb (117 kg)     Objective:    Vital Signs:  BP 112/67    VITAL SIGNS:  reviewed  ASSESSMENT & PLAN:    Hypertension BP: 112/67   Continue Lisinopril and HCTZ Advised to stay compliant with medications Advised DASH diet and moderate exercise as tolerated F/u CMP and lipid profile  Arthritis Had referred to Rheumatologist for severe hand pain - MTP and PIP involvement Recently completed steroids Tylenol/Ibuprofen PRN  Anxiety Uncontrolled with Buspar Having sleep difficulty Was tolerating Xanax well in the past Had discussion about potential side effects and addictive potential, patient understands and agrees to take it as prescribed. Started Xanax 0.25 mg qHS PRN after reviewing PDMP    Time:   Today, I have spent 16 minutes reviewing the chart, including problem list, medications, and with the patient with telehealth technology discussing the above problems.   Medication Adjustments/Labs and Tests Ordered: Current medicines are reviewed at length with the patient today.  Concerns regarding medicines are outlined above.   Tests Ordered: Orders Placed This Encounter  Procedures  . CBC with Differential  . CMP14+EGFR  . Lipid panel  . Hemoglobin A1c  . Vitamin D (25 hydroxy)  . TSH + free T4    Medication Changes: Meds ordered this encounter  Medications  . ALPRAZolam (XANAX) 0.25 MG tablet    Sig: Take 1 tablet (0.25 mg  total) by mouth at bedtime as needed for anxiety.    Dispense:  30 tablet    Refill:  2  . hydrochlorothiazide (HYDRODIURIL) 25 MG tablet    Sig: Take 1 tablet (25 mg total) by mouth daily.    Dispense:  30 tablet    Refill:  3     Note: This dictation was prepared with Dragon dictation along with smaller phrase technology. Similar sounding words can be transcribed inadequately or may not be corrected upon review. Any transcriptional errors that result from this process are unintentional.      Disposition:  Follow up  Signed, Lindell Spar, MD  08/31/2020 2:28 PM     [place]

## 2020-08-31 NOTE — Patient Instructions (Signed)
Please continue to take medications as prescribed.  Please continue to follow DASH diet and perform moderate exercise/walking as tolerated.  Please get fasting blood tests done before the next visit.

## 2020-08-31 NOTE — Assessment & Plan Note (Addendum)
BP: 112/67   Continue Lisinopril and HCTZ Advised to stay compliant with medications Advised DASH diet and moderate exercise as tolerated F/u CMP and lipid profile

## 2020-08-31 NOTE — Assessment & Plan Note (Signed)
Had referred to Rheumatologist for severe hand pain - MTP and PIP involvement Recently completed steroids Tylenol/Ibuprofen PRN

## 2020-09-08 ENCOUNTER — Other Ambulatory Visit: Payer: Self-pay | Admitting: Internal Medicine

## 2020-09-09 ENCOUNTER — Encounter: Payer: Self-pay | Admitting: Physical Medicine and Rehabilitation

## 2020-09-15 ENCOUNTER — Ambulatory Visit: Payer: 59 | Admitting: Podiatry

## 2020-09-15 ENCOUNTER — Other Ambulatory Visit: Payer: Self-pay | Admitting: Internal Medicine

## 2020-09-15 DIAGNOSIS — E559 Vitamin D deficiency, unspecified: Secondary | ICD-10-CM

## 2020-09-15 LAB — CMP14+EGFR
ALT: 18 IU/L (ref 0–32)
AST: 20 IU/L (ref 0–40)
Albumin/Globulin Ratio: 1.2 (ref 1.2–2.2)
Albumin: 4.1 g/dL (ref 3.8–4.9)
Alkaline Phosphatase: 95 IU/L (ref 44–121)
BUN/Creatinine Ratio: 27 — ABNORMAL HIGH (ref 9–23)
BUN: 26 mg/dL — ABNORMAL HIGH (ref 6–24)
Bilirubin Total: <0.2 mg/dL (ref 0.0–1.2)
CO2: 22 mmol/L (ref 20–29)
Calcium: 9.3 mg/dL (ref 8.7–10.2)
Chloride: 103 mmol/L (ref 96–106)
Creatinine, Ser: 0.96 mg/dL (ref 0.57–1.00)
GFR calc Af Amer: 79 mL/min/{1.73_m2} (ref >59)
GFR calc non Af Amer: 69 mL/min/{1.73_m2} (ref >59)
Globulin, Total: 3.5 g/dL (ref 1.5–4.5)
Glucose: 126 mg/dL — ABNORMAL HIGH (ref 65–99)
Potassium: 4.4 mmol/L (ref 3.5–5.2)
Sodium: 139 mmol/L (ref 134–144)
Total Protein: 7.6 g/dL (ref 6.0–8.5)

## 2020-09-15 LAB — CBC WITH DIFFERENTIAL/PLATELET
Basophils Absolute: 0.1 10*3/uL (ref 0.0–0.2)
Basos: 1 %
EOS (ABSOLUTE): 0.3 10*3/uL (ref 0.0–0.4)
Eos: 3 %
Hematocrit: 35.9 % (ref 34.0–46.6)
Hemoglobin: 12.1 g/dL (ref 11.1–15.9)
Immature Grans (Abs): 0 10*3/uL (ref 0.0–0.1)
Immature Granulocytes: 0 %
Lymphocytes Absolute: 3 10*3/uL (ref 0.7–3.1)
Lymphs: 24 %
MCH: 31 pg (ref 26.6–33.0)
MCHC: 33.7 g/dL (ref 31.5–35.7)
MCV: 92 fL (ref 79–97)
Monocytes Absolute: 0.7 10*3/uL (ref 0.1–0.9)
Monocytes: 5 %
Neutrophils Absolute: 8.4 10*3/uL — ABNORMAL HIGH (ref 1.4–7.0)
Neutrophils: 67 %
Platelets: 309 10*3/uL (ref 150–450)
RBC: 3.9 x10E6/uL (ref 3.77–5.28)
RDW: 13 % (ref 11.7–15.4)
WBC: 12.5 10*3/uL — ABNORMAL HIGH (ref 3.4–10.8)

## 2020-09-15 LAB — LIPID PANEL
Chol/HDL Ratio: 4.9 ratio — ABNORMAL HIGH (ref 0.0–4.4)
Cholesterol, Total: 163 mg/dL (ref 100–199)
HDL: 33 mg/dL — ABNORMAL LOW (ref >39)
LDL Chol Calc (NIH): 113 mg/dL — ABNORMAL HIGH (ref 0–99)
Triglycerides: 92 mg/dL (ref 0–149)
VLDL Cholesterol Cal: 17 mg/dL (ref 5–40)

## 2020-09-15 LAB — TSH+FREE T4
Free T4: 1.08 ng/dL (ref 0.82–1.77)
TSH: 1.73 u[IU]/mL (ref 0.450–4.500)

## 2020-09-15 LAB — HEMOGLOBIN A1C
Est. average glucose Bld gHb Est-mCnc: 117 mg/dL
Hgb A1c MFr Bld: 5.7 % — ABNORMAL HIGH (ref 4.8–5.6)

## 2020-09-15 LAB — VITAMIN D 25 HYDROXY (VIT D DEFICIENCY, FRACTURES): Vit D, 25-Hydroxy: 17.1 ng/mL — ABNORMAL LOW (ref 30.0–100.0)

## 2020-09-15 MED ORDER — VITAMIN D (ERGOCALCIFEROL) 1.25 MG (50000 UNIT) PO CAPS
50000.0000 [IU] | ORAL_CAPSULE | ORAL | 5 refills | Status: DC
Start: 2020-09-15 — End: 2020-12-26

## 2020-09-26 ENCOUNTER — Other Ambulatory Visit: Payer: Self-pay | Admitting: Internal Medicine

## 2020-09-26 DIAGNOSIS — F419 Anxiety disorder, unspecified: Secondary | ICD-10-CM

## 2020-10-31 ENCOUNTER — Other Ambulatory Visit: Payer: Self-pay | Admitting: Internal Medicine

## 2020-11-11 ENCOUNTER — Encounter: Payer: Self-pay | Admitting: Internal Medicine

## 2020-11-11 ENCOUNTER — Ambulatory Visit (INDEPENDENT_AMBULATORY_CARE_PROVIDER_SITE_OTHER): Payer: 59 | Admitting: Internal Medicine

## 2020-11-11 ENCOUNTER — Other Ambulatory Visit: Payer: Self-pay

## 2020-11-11 ENCOUNTER — Ambulatory Visit (HOSPITAL_COMMUNITY)
Admission: RE | Admit: 2020-11-11 | Discharge: 2020-11-11 | Disposition: A | Discharge status: Home / Self Care | Payer: 59 | Source: Ambulatory Visit | Attending: Internal Medicine | Admitting: Internal Medicine

## 2020-11-11 VITALS — BP 124/75 | HR 88 | Temp 97.9°F | Resp 18 | Ht 68.0 in | Wt 265.0 lb

## 2020-11-11 DIAGNOSIS — M62838 Other muscle spasm: Secondary | ICD-10-CM

## 2020-11-11 DIAGNOSIS — Z1211 Encounter for screening for malignant neoplasm of colon: Secondary | ICD-10-CM

## 2020-11-11 DIAGNOSIS — Z124 Encounter for screening for malignant neoplasm of cervix: Secondary | ICD-10-CM

## 2020-11-11 DIAGNOSIS — M25562 Pain in left knee: Secondary | ICD-10-CM | POA: Insufficient documentation

## 2020-11-11 MED ORDER — CYCLOBENZAPRINE HCL 5 MG PO TABS: 5.0000 mg | ORAL_TABLET | Freq: Three times a day (TID) | ORAL | 0 refills | Status: DC | PRN

## 2020-11-11 MED ORDER — TRAMADOL HCL 50 MG PO TABS: 50.0000 mg | ORAL_TABLET | Freq: Three times a day (TID) | ORAL | 0 refills | Status: AC | PRN

## 2020-11-11 NOTE — Progress Notes (Signed)
Acute Office Visit  Subjective:    Patient ID: Brenda Taylor, female    DOB: Apr 06, 1971, 50 y.o.   MRN: [id number]  Chief Complaint  Patient presents with  . Knee Pain    Leg knee pain had a slip off the porch on Sunday has tried ibuprofen and tylenol she feels like her balance has been off lately cant stand on her leg also had been getting cramp right under breast bone on the right side     HPI Patient is in today for evaluation of left knee pain for last 1 week. Pain is constant, worse with movement and radiates to her leg. She had a fall (slipped from [place]) 2 weeks ago. She did not have pain for first few days. She has tried taking Tylenol and Ibuprofen with minimal help.  She also mentions cramping over right upper abdominal region when she tries to bend. Denies any constipation, diarrhea, melena or hematochezia. Denies any injury over that region.  Past Medical History:  Diagnosis Date  . Anxiety   . Chronic pain   . DDD (degenerative disc disease)   . HBP (high blood pressure)   . Hernia   . Rotator cuff syndrome of left shoulder 10/09/2011  . Ulcer disease     Past Surgical History:  Procedure Laterality Date  . APPENDECTOMY    . BACK SURGERY     x 4  . CARPAL TUNNEL RELEASE     bilateral  . CHOLECYSTECTOMY    . COLONOSCOPY  10/20/2009   WUX:[id number] anal canal/left-side diverticula and multiple polyps. poor prep compromised exam. Next TCS 09/2014.  Marland Kitchen ESOPHAGOGASTRODUODENOSCOPY  03/06/2010   UVO:ZDGU quadrant distal esophageal/patent tubular esophagus/small HH, antral erosions and ulcerations. Bx negative for persistent H.pylori  . ESOPHAGOGASTRODUODENOSCOPY  10/20/2009   YQI:HKVQQ HH/prepyloric antral ulcer s/p bx (+h.pylori), ERE  . INTRAUTERINE DEVICE INSERTION    . MASTECTOMY, PARTIAL     x 2  . MOUTH SURGERY    . ROTATOR CUFF REPAIR     right  . TUBAL LIGATION      Family History  Problem Relation Age of Onset  . Heart disease Other   . Arthritis Other   .  Cancer Other   . Asthma Other   . Diabetes Other   . Heart disease Mother   . Diabetes Mother   . Cancer Mother   . Non-Hodgkin's lymphoma Mother   . Hypertension Mother   . Arthritis Mother   . Lung cancer Father   . Neuropathy Father   . Hypertension Father     Social History   Socioeconomic History  . Marital status: Widowed    Spouse name: Not on file  . Number of children: Not on file  . Years of education: 18  . Highest education level: Not on file  Occupational History  . Occupation: Scientist, water quality  Tobacco Use  . Smoking status: Current Every Day Smoker    Packs/day: 0.75    Types: Cigarettes  . Smokeless tobacco: Never Used  Vaping Use  . Vaping Use: Never used  Substance and Sexual Activity  . Alcohol use: No  . Drug use: Yes    Frequency: 7.0 times per week    Types: Marijuana  . Sexual activity: Yes    Birth control/protection: I.U.D.  Other Topics Concern  . Not on file  Social History Narrative  . Not on file   Social Determinants of Health   Financial Resource Strain: Not on file  Food Insecurity: Not on file  Transportation Needs: Not on file  Physical Activity: Not on file  Stress: Not on file  Social Connections: Not on file  Intimate Partner Violence: Not on file    Outpatient Medications Prior to Visit  Medication Sig Dispense Refill  . ALPRAZolam (XANAX) 0.25 MG tablet Take 1 tablet (0.25 mg total) by mouth at bedtime as needed for anxiety. 30 tablet 2  . benzonatate (TESSALON) 100 MG capsule TAKE 1 CAPSULE BY MOUTH 2 TIMES DAILY AS NEEDED FOR COUGH. 30 capsule 0  . busPIRone (BUSPAR) 15 MG tablet TAKE 1 TABLET BY MOUTH 3 TIMES DAILY. 90 tablet 2  . gabapentin (NEURONTIN) 100 MG capsule Take 1 capsule (100 mg total) by mouth 2 (two) times daily. 60 capsule 2  . hydrochlorothiazide (HYDRODIURIL) 25 MG tablet Take 1 tablet (25 mg total) by mouth daily. 30 tablet 3  . lisinopril (ZESTRIL) 40 MG tablet Take 1 tablet (40 mg total) by mouth daily.  90 tablet 3  . methylPREDNISolone (MEDROL DOSEPAK) 4 MG TBPK tablet 6 day dose pack - take as directed 21 tablet 0  . Vitamin D, Ergocalciferol, (DRISDOL) 1.25 MG (50000 UNIT) CAPS capsule Take 1 capsule (50,000 Units total) by mouth every 7 (seven) days. 5 capsule 5  . fluticasone (FLONASE) 50 MCG/ACT nasal spray Place 1 spray into both nostrils daily for 14 days. 16 g 0   No facility-administered medications prior to visit.    Allergies  Allergen Reactions  . Amoxicillin     Reaction is unknown  . Aspirin     Cannot take due to ulcers    Review of Systems  Constitutional: Negative for chills and fever.  HENT: Positive for hearing loss (Right sided). Negative for congestion, postnasal drip, sinus pressure, sinus pain and sore throat.   Eyes: Negative for pain and discharge.  Respiratory: Negative for cough and shortness of breath.   Cardiovascular: Negative for chest pain and palpitations.  Gastrointestinal: Negative for abdominal pain, constipation, diarrhea, nausea and vomiting.  Endocrine: Negative for polydipsia and polyuria.  Genitourinary: Negative for dysuria and hematuria.  Musculoskeletal: Positive for arthralgias. Negative for neck pain and neck stiffness.  Skin: Negative for rash.  Neurological: Negative for dizziness, seizures, syncope, speech difficulty, weakness and light-headedness.  Psychiatric/Behavioral: Negative for agitation and behavioral problems.       Objective:    Physical Exam Vitals reviewed.  Constitutional:      General: She is not in acute distress.    Appearance: She is obese. She is not diaphoretic.  HENT:     Head: Normocephalic and atraumatic.     Nose: Nose normal. No congestion.     Mouth/Throat:     Mouth: Mucous membranes are moist.     Pharynx: No posterior oropharyngeal erythema.  Eyes:     General: No scleral icterus.    Extraocular Movements: Extraocular movements intact.     Pupils: Pupils are equal, round, and reactive to  light.  Cardiovascular:     Rate and Rhythm: Normal rate and regular rhythm.     Heart sounds: No murmur heard.   Pulmonary:     Breath sounds: Normal breath sounds. No wheezing or rales.  Abdominal:     Palpations: Abdomen is soft.     Tenderness: There is no abdominal tenderness.  Musculoskeletal:        General: Tenderness (left knee with swelling, ROM limited due to pain) present.     Cervical back: Neck supple.  No tenderness.     Right lower leg: No edema.     Left lower leg: No edema.  Skin:    General: Skin is warm.     Findings: No rash.  Neurological:     General: No focal deficit present.     Mental Status: She is alert and oriented to person, place, and time.  Psychiatric:        Mood and Affect: Mood normal.        Behavior: Behavior normal.     BP 124/75 (BP Location: Right Arm, Patient Position: Sitting, Cuff Size: Normal)   Pulse 88   Temp 97.9 F (36.6 C) (Oral)   Resp 18   Ht 5\' 8"  (1.727 m)   Wt 265 lb (120.2 kg)   SpO2 99%   BMI 40.29 kg/m  Wt Readings from Last 3 Encounters:  11/11/20 265 lb (120.2 kg)  07/05/20 258 lb (117 kg)  06/28/20 256 lb (116.1 kg)    Health Maintenance Due  Topic Date Due  . Hepatitis C Screening  Never done  . COVID-19 Vaccine (1) Never done  . HIV Screening  Never done  . PAP SMEAR-Modifier  Never done    There are no preventive care reminders to display for this patient.   Lab Results  Component Value Date   TSH 1.730 09/14/2020   Lab Results  Component Value Date   WBC 12.5 (H) 09/14/2020   HGB 12.1 09/14/2020   HCT 35.9 09/14/2020   MCV 92 09/14/2020   PLT 309 09/14/2020   Lab Results  Component Value Date   NA 139 09/14/2020   K 4.4 09/14/2020   CO2 22 09/14/2020   GLUCOSE 126 (H) 09/14/2020   BUN 26 (H) 09/14/2020   CREATININE 0.96 09/14/2020   BILITOT <0.2 09/14/2020   ALKPHOS 95 09/14/2020   AST 20 09/14/2020   ALT 18 09/14/2020   PROT 7.6 09/14/2020   ALBUMIN 4.1 09/14/2020    CALCIUM 9.3 09/14/2020   ANIONGAP 7 01/17/2018   Lab Results  Component Value Date   CHOL 163 09/14/2020   Lab Results  Component Value Date   HDL 33 (L) 09/14/2020   Lab Results  Component Value Date   LDLCALC 113 (H) 09/14/2020   Lab Results  Component Value Date   TRIG 92 09/14/2020   Lab Results  Component Value Date   CHOLHDL 4.9 (H) 09/14/2020   Lab Results  Component Value Date   HGBA1C 5.7 (H) 09/14/2020       Assessment & Plan:   Problem List Items Addressed This Visit   None   Visit Diagnoses    Acute left knee pain S/p mechanical fall Will obtain X-ray of the knee If inconclusive, plan for MRI Tramadol PRN Referred to Orthopedic surgeon for further assessment   Spasm of abdominal muscles Abdominal pain - RUQ pain, unclear etiology Intermittent pain, unrelated to any injury Has had cholecystectomy and appendectomy Trial of muscle relaxant - Flexeril  Routine cervical smear   Relevant Orders   Ambulatory referral to Obstetrics / Gynecology   Special screening for malignant neoplasms, colon       Relevant Orders   Ambulatory referral to Gastroenterology     Meds ordered this encounter  Medications  . traMADol (ULTRAM) 50 MG tablet    Sig: Take 1 tablet (50 mg total) by mouth every 8 (eight) hours as needed for up to 10 days.    Dispense:  30 tablet  Refill:  0     Priest Lockridge Keith Rake, MD

## 2020-11-11 NOTE — Patient Instructions (Signed)
Acute Knee Pain, Adult Many things can cause knee pain. Sometimes, knee pain is sudden (acute) and may be caused by damage, swelling, or irritation of the muscles and tissues that support your knee. The pain often goes away on its own with time and rest. If the pain does not go away, tests may be done to find out what is causing the pain. Follow these instructions at home: If you have a knee sleeve or brace:  Wear the knee sleeve or brace as told by your doctor. Take it off only as told by your doctor.  Loosen it if your toes: ? Tingle. ? Become numb. ? Turn cold and blue.  Keep it clean.  If the knee sleeve or brace is not waterproof: ? Do not let it get wet. ? Cover it with a watertight covering when you take a bath or shower.   Activity  Rest your knee.  Do not do things that cause pain or make pain worse.  Avoid activities where both feet leave the ground at the same time (high-impact activities). Examples are running, jumping rope, and doing jumping jacks.  Work with a physical therapist to make a safe exercise program, as told by your doctor. Managing pain, stiffness, and swelling  If told, put ice on the knee. To do this: ? If you have a removable knee sleeve or brace, take it off as told by your doctor. ? Put ice in a plastic bag. ? Place a towel between your skin and the bag. ? Leave the ice on for 20 minutes, 2-3 times a day. ? Take off the ice if your skin turns bright red. This is very important. If you cannot feel pain, heat, or cold, you have a greater risk of damage to the area.  If told, use an elastic bandage to put pressure (compression) on your injured knee.  Raise your knee above the level of your heart while you are sitting or lying down.  Sleep with a pillow under your knee.   General instructions  Take over-the-counter and prescription medicines only as told by your doctor.  Do not smoke or use any products that contain nicotine or tobacco. If you  need help quitting, ask your doctor.  If you are overweight, work with your doctor and a food expert (dietitian) to set goals to lose weight. Being overweight can make your knee hurt more.  Watch for any changes in your symptoms.  Keep all follow-up visits. Contact a doctor if:  The knee pain does not stop.  The knee pain changes or gets worse.  You have a fever along with knee pain.  Your knee is red or feels warm when you touch it.  Your knee gives out or locks up. Get help right away if:  Your knee swells, and the swelling gets worse.  You cannot move your knee.  You have very bad knee pain that does not get better with pain medicine. Summary  Many things can cause knee pain. The pain often goes away on its own with time and rest.  Your doctor may do tests to find out the cause of the pain.  Watch for any changes in your symptoms. Relieve your pain with rest, medicines, light activity, and use of ice.  Get help right away if you cannot move your knee or your knee pain is very bad. This information is not intended to replace advice given to you by your health care provider. Make sure you discuss   any questions you have with your health care provider. Document Revised: 12/23/2019 Document Reviewed: 12/23/2019 Elsevier Patient Education  2021 Elsevier Inc.  

## 2020-11-22 ENCOUNTER — Other Ambulatory Visit: Payer: Self-pay | Admitting: Internal Medicine

## 2020-11-30 ENCOUNTER — Encounter: Payer: Self-pay | Admitting: Internal Medicine

## 2020-11-30 ENCOUNTER — Other Ambulatory Visit: Payer: Self-pay | Admitting: Internal Medicine

## 2020-12-07 ENCOUNTER — Telehealth: Payer: Self-pay | Admitting: Orthopedic Surgery

## 2020-12-07 ENCOUNTER — Ambulatory Visit: Payer: 59 | Admitting: Orthopedic Surgery

## 2020-12-07 ENCOUNTER — Other Ambulatory Visit: Payer: Self-pay

## 2020-12-07 ENCOUNTER — Encounter: Payer: Self-pay | Admitting: Orthopedic Surgery

## 2020-12-07 VITALS — BP 99/78 | HR 102 | Ht 68.0 in | Wt 260.0 lb

## 2020-12-07 DIAGNOSIS — S83242A Other tear of medial meniscus, current injury, left knee, initial encounter: Secondary | ICD-10-CM

## 2020-12-07 DIAGNOSIS — M5432 Sciatica, left side: Secondary | ICD-10-CM

## 2020-12-07 DIAGNOSIS — M25562 Pain in left knee: Secondary | ICD-10-CM | POA: Diagnosis not present

## 2020-12-07 MED ORDER — HYDROCODONE-ACETAMINOPHEN 5-325 MG PO TABS: 1.0000 | ORAL_TABLET | Freq: Three times a day (TID) | ORAL | 0 refills | Status: DC | PRN

## 2020-12-07 NOTE — Progress Notes (Signed)
Chief Complaint  Patient presents with  . Knee Pain    Couple months ago woke up with pain left knee increased pain since stepping wrong off porch on Easter (11/06/20)   Chief complaint acute onset of left knee pain  50 year old female status post 5 lumbar surgeries at [facility] comes in complaining of medial sided left knee pain which started around 17 April when she stepped off of a deck and injured the left knee  She was seen by primary care who put her on ibuprofen tramadol and Motrin and Flexeril.  However she got constipated  She now has increased sciatica pain along with her medial knee pain swelling and trouble weightbearing she has been continuing to work  This is 4 weeks since her injury and treatment and she has [employer]  System review there is no fever no rash related to the left leg  Past Medical History:  Diagnosis Date  . Anxiety   . Chronic pain   . DDD (degenerative disc disease)   . HBP (high blood pressure)   . Hernia   . Rotator cuff syndrome of left shoulder 10/09/2011  . Ulcer disease    Past Surgical History:  Procedure Laterality Date  . APPENDECTOMY    . BACK SURGERY     x 4  . CARPAL TUNNEL RELEASE     bilateral  . CHOLECYSTECTOMY    . COLONOSCOPY  10/20/2009   FKC:LEXNTZG anal canal/left-side diverticula and multiple polyps. poor prep compromised exam. Next TCS 09/2014.  Marland Kitchen ESOPHAGOGASTRODUODENOSCOPY  03/06/2010   YFV:CBSW quadrant distal esophageal/patent tubular esophagus/small HH, antral erosions and ulcerations. Bx negative for persistent H.pylori  . ESOPHAGOGASTRODUODENOSCOPY  10/20/2009   HQP:RFFMB HH/prepyloric antral ulcer s/p bx (+h.pylori), ERE  . INTRAUTERINE DEVICE INSERTION    . MASTECTOMY, PARTIAL     x 2  . MOUTH SURGERY    . ROTATOR CUFF REPAIR     right  . TUBAL LIGATION      BP 99/78   Pulse (!) 102   Ht _0  (1.727 m)   Wt 260 lb (117.9 kg)   BMI 39.53 kg/m   She is awake alert and oriented x3 mood  and affect is normal she is limping noticeably favoring the left lower extremity  She has tenderness over the medial joint line with effusion and painful range of motion without instability  X-ray was obtained at an outside facility and she has mild narrowing of the medial compartment otherwise normal knee  Report   FINDINGS: No evidence of fracture, dislocation, or joint effusion. Minimal tricompartment degenerative spurring. Vascular calcifications.  IMPRESSION: No acute osseous abnormality. Minimal tricompartment degenerative spurring   Electronically Signed   By: Dahlia Bailiff MD   On: 11/13/2020 15:49   Encounter Diagnoses  Name Primary?  . Acute pain of left knee Yes  . Sciatica of left side worsening   . Acute medial meniscus tear of left knee, initial encounter     Recommend ice Rest Ibuprofen Hydrocodone  Meds ordered this encounter  Medications  . HYDROcodone-acetaminophen (NORCO/VICODIN) 5-325 MG tablet    Sig: Take 1 tablet by mouth every 8 (eight) hours as needed for up to 5 days for moderate pain.    Dispense:  15 tablet    Refill:  0     Follow-up in about 2-1/2 weeks and if still symptomatic we will have met criteria for 6 weeks of treatment without improvement

## 2020-12-07 NOTE — Patient Instructions (Signed)
Rest  Ice  Ibuprofen   Change to neurontin to 300 mg 3 x a day   Start norco 5 mg q 8 x 5 days

## 2020-12-07 NOTE — Telephone Encounter (Signed)
Was sent to [facility].

## 2020-12-07 NOTE — Telephone Encounter (Signed)
When Brenda Taylor was here earlier she was under the impression that Dr. Aline Brochure was going to send in pain medication, Norco to [facility] for her.  She said this has not been done.  Can you check on this for her?  Thanks

## 2020-12-12 ENCOUNTER — Ambulatory Visit: Payer: 59 | Admitting: Orthopedic Surgery

## 2020-12-15 ENCOUNTER — Ambulatory Visit (INDEPENDENT_AMBULATORY_CARE_PROVIDER_SITE_OTHER): Payer: 59 | Admitting: Internal Medicine

## 2020-12-15 ENCOUNTER — Encounter: Payer: Self-pay | Admitting: Internal Medicine

## 2020-12-15 ENCOUNTER — Other Ambulatory Visit: Payer: Self-pay

## 2020-12-15 VITALS — BP 118/70 | HR 100 | Temp 98.4°F | Resp 18 | Ht 68.0 in | Wt 263.1 lb

## 2020-12-15 DIAGNOSIS — M1990 Unspecified osteoarthritis, unspecified site: Secondary | ICD-10-CM | POA: Diagnosis not present

## 2020-12-15 DIAGNOSIS — E782 Mixed hyperlipidemia: Secondary | ICD-10-CM

## 2020-12-15 DIAGNOSIS — E559 Vitamin D deficiency, unspecified: Secondary | ICD-10-CM

## 2020-12-15 DIAGNOSIS — F419 Anxiety disorder, unspecified: Secondary | ICD-10-CM

## 2020-12-15 DIAGNOSIS — Z114 Encounter for screening for human immunodeficiency virus [HIV]: Secondary | ICD-10-CM

## 2020-12-15 DIAGNOSIS — Z0000 Encounter for general adult medical examination without abnormal findings: Secondary | ICD-10-CM | POA: Diagnosis not present

## 2020-12-15 DIAGNOSIS — Z789 Other specified health status: Secondary | ICD-10-CM

## 2020-12-15 DIAGNOSIS — I10 Essential (primary) hypertension: Secondary | ICD-10-CM | POA: Diagnosis not present

## 2020-12-15 DIAGNOSIS — Z0001 Encounter for general adult medical examination with abnormal findings: Secondary | ICD-10-CM | POA: Insufficient documentation

## 2020-12-15 DIAGNOSIS — Z1159 Encounter for screening for other viral diseases: Secondary | ICD-10-CM

## 2020-12-15 NOTE — Patient Instructions (Signed)
Please use debrox ear drops for excess ear wax. Avoid using sharp objects for cleaning.  Continue to take medications as prescribed.  Please get fasting blood tests done before the next visit.  Please continue to follow low salt and low carb diet and ambulate as tolerated, goal of at least 150 mins/week walking.

## 2020-12-15 NOTE — Assessment & Plan Note (Signed)
Lipid profile reviewed Follow DASH diet If persistently elevated, will start statin

## 2020-12-15 NOTE — Assessment & Plan Note (Signed)
Well-controlled with BuSpar and Xanax as needed.

## 2020-12-15 NOTE — Assessment & Plan Note (Signed)
BP: 118/70   Continue Lisinopril and HCTZ Advised to stay compliant with medications Advised DASH diet and moderate exercise as tolerated F/u CMP and lipid profile

## 2020-12-15 NOTE — Progress Notes (Signed)
Established Patient Office Visit  Subjective:  Patient ID: Brenda Taylor, female    DOB: 1970/08/07  Age: 50 y.o. MRN: [id number]  CC:  Chief Complaint  Patient presents with  . Annual Exam    Annual exam pt would like ears checked feels like its stopped up     HPI Brenda Taylor is a 50 year old female with past medical history of hypertension, anxiety and colonic polypwho presents for annual physical.  She has been following up with orthopedic surgeon for evaluation of knee pain.  She is planned to get MRI of the knee for evaluation of possible ligament tear.  She has been feeling better with foot pain since her Podiatrist gave her steroid injection.  HTN: BP is well-controlled. Takes medications regularly. Patient denies headache, dizziness, chest pain, dyspnea or palpitations.  Anxiety: Now well-controlled with Buspar and Xanax. Denies any anhedonia, SI or HI.  Past Medical History:  Diagnosis Date  . Anxiety   . Chronic pain   . DDD (degenerative disc disease)   . HBP (high blood pressure)   . Hernia   . Rotator cuff syndrome of left shoulder 10/09/2011  . Ulcer disease     Past Surgical History:  Procedure Laterality Date  . APPENDECTOMY    . BACK SURGERY     x 4  . CARPAL TUNNEL RELEASE     bilateral  . CHOLECYSTECTOMY    . COLONOSCOPY  10/20/2009   VQM:GQQPYPP anal canal/left-side diverticula and multiple polyps. poor prep compromised exam. Next TCS 09/2014.  Marland Kitchen ESOPHAGOGASTRODUODENOSCOPY  03/06/2010   JKD:TOIZ quadrant distal esophageal/patent tubular esophagus/small HH, antral erosions and ulcerations. Bx negative for persistent H.pylori  . ESOPHAGOGASTRODUODENOSCOPY  10/20/2009   TIW:PYKDX HH/prepyloric antral ulcer s/p bx (+h.pylori), ERE  . INTRAUTERINE DEVICE INSERTION    . MASTECTOMY, PARTIAL     x 2  . MOUTH SURGERY    . ROTATOR CUFF REPAIR     right  . TUBAL LIGATION      Family History  Problem Relation Age of Onset  . Heart disease Other   .  Arthritis Other   . Cancer Other   . Asthma Other   . Diabetes Other   . Heart disease Mother   . Diabetes Mother   . Cancer Mother   . Non-Hodgkin's lymphoma Mother   . Hypertension Mother   . Arthritis Mother   . Lung cancer Father   . Neuropathy Father   . Hypertension Father     Social History   Socioeconomic History  . Marital status: Widowed    Spouse name: Not on file  . Number of children: Not on file  . Years of education: 46  . Highest education level: Not on file  Occupational History  . Occupation: Scientist, water quality  Tobacco Use  . Smoking status: Current Every Day Smoker    Packs/day: 0.75    Types: Cigarettes  . Smokeless tobacco: Never Used  Vaping Use  . Vaping Use: Never used  Substance and Sexual Activity  . Alcohol use: No  . Drug use: Yes    Frequency: 7.0 times per week    Types: Marijuana  . Sexual activity: Yes    Birth control/protection: I.U.D.  Other Topics Concern  . Not on file  Social History Narrative  . Not on file   Social Determinants of Health   Financial Resource Strain: Not on file  Food Insecurity: Not on file  Transportation Needs: Not on file  Physical  Activity: Not on file  Stress: Not on file  Social Connections: Not on file  Intimate Partner Violence: Not on file    Outpatient Medications Prior to Visit  Medication Sig Dispense Refill  . ALPRAZolam (XANAX) 0.25 MG tablet Take 1 tablet (0.25 mg total) by mouth at bedtime as needed for anxiety. 30 tablet 2  . benzonatate (TESSALON) 100 MG capsule TAKE 1 CAPSULE BY MOUTH 2 TIMES DAILY AS NEEDED FOR COUGH. 30 capsule 0  . busPIRone (BUSPAR) 15 MG tablet TAKE 1 TABLET BY MOUTH 3 TIMES DAILY. 90 tablet 2  . cyclobenzaprine (FLEXERIL) 5 MG tablet Take 1 tablet (5 mg total) by mouth 3 (three) times daily as needed for muscle spasms. 30 tablet 0  . gabapentin (NEURONTIN) 100 MG capsule TAKE 1 CAPSULE BY MOUTH 2 TIMES DAILY. 60 capsule 0  . hydrochlorothiazide (HYDRODIURIL) 25 MG  tablet Take 1 tablet (25 mg total) by mouth daily. 30 tablet 3  . lisinopril (ZESTRIL) 40 MG tablet Take 1 tablet (40 mg total) by mouth daily. 90 tablet 3  . Vitamin D, Ergocalciferol, (DRISDOL) 1.25 MG (50000 UNIT) CAPS capsule Take 1 capsule (50,000 Units total) by mouth every 7 (seven) days. 5 capsule 5  . fluticasone (FLONASE) 50 MCG/ACT nasal spray Place 1 spray into both nostrils daily for 14 days. 16 g 0   No facility-administered medications prior to visit.    Allergies  Allergen Reactions  . Amoxicillin     Reaction is unknown  . Aspirin     Cannot take due to ulcers    ROS Review of Systems  Constitutional: Negative for chills and fever.  HENT: Positive for hearing loss (Right sided). Negative for congestion, postnasal drip, sinus pressure, sinus pain and sore throat.   Eyes: Negative for pain and discharge.  Respiratory: Negative for cough and shortness of breath.   Cardiovascular: Negative for chest pain and palpitations.  Gastrointestinal: Negative for abdominal pain, constipation, diarrhea, nausea and vomiting.  Endocrine: Negative for polydipsia and polyuria.  Genitourinary: Negative for dysuria and hematuria.  Musculoskeletal: Positive for arthralgias. Negative for neck pain and neck stiffness.  Skin: Negative for rash.  Neurological: Negative for dizziness, seizures, syncope, speech difficulty, weakness and light-headedness.  Psychiatric/Behavioral: Negative for agitation and behavioral problems.      Objective:    Physical Exam Vitals reviewed.  Constitutional:      General: She is not in acute distress.    Appearance: She is obese. She is not diaphoretic.  HENT:     Head: Normocephalic and atraumatic.     Nose: Nose normal. No congestion.     Mouth/Throat:     Mouth: Mucous membranes are moist.     Pharynx: No posterior oropharyngeal erythema.  Eyes:     General: No scleral icterus.    Extraocular Movements: Extraocular movements intact.  Neck:      Vascular: No carotid bruit.  Cardiovascular:     Rate and Rhythm: Normal rate and regular rhythm.     Pulses: Normal pulses.     Heart sounds: Normal heart sounds. No murmur heard.   Pulmonary:     Breath sounds: Normal breath sounds. No wheezing or rales.  Abdominal:     Palpations: Abdomen is soft.     Tenderness: There is no abdominal tenderness.  Musculoskeletal:     Cervical back: Neck supple. No tenderness.     Right lower leg: No edema.     Left lower leg: No edema.  Skin:  General: Skin is warm.     Findings: No rash.  Neurological:     General: No focal deficit present.     Mental Status: She is alert and oriented to person, place, and time.     Sensory: No sensory deficit.     Motor: No weakness.  Psychiatric:        Mood and Affect: Mood normal.        Behavior: Behavior normal.     BP 118/70 (BP Location: Left Arm, Patient Position: Sitting, Cuff Size: Normal)   Pulse 100   Temp 98.4 F (36.9 C) (Oral)   Resp 18   Ht _0  (1.727 m)   Wt 263 lb 1.9 oz (119.4 kg)   SpO2 97%   BMI 40.01 kg/m  Wt Readings from Last 3 Encounters:  12/15/20 263 lb 1.9 oz (119.4 kg)  12/07/20 260 lb (117.9 kg)  11/11/20 265 lb (120.2 kg)     Health Maintenance Due  Topic Date Due  . COVID-19 Vaccine (1) Never done  . Hepatitis C Screening  Never done  . PAP SMEAR-Modifier  Never done  . Zoster Vaccines- Shingrix (1 of 2) Never done    There are no preventive care reminders to display for this patient.  Lab Results  Component Value Date   TSH 1.730 09/14/2020   Lab Results  Component Value Date   WBC 12.5 (H) 09/14/2020   HGB 12.1 09/14/2020   HCT 35.9 09/14/2020   MCV 92 09/14/2020   PLT 309 09/14/2020   Lab Results  Component Value Date   NA 139 09/14/2020   K 4.4 09/14/2020   CO2 22 09/14/2020   GLUCOSE 126 (H) 09/14/2020   BUN 26 (H) 09/14/2020   CREATININE 0.96 09/14/2020   BILITOT <0.2 09/14/2020   ALKPHOS 95 09/14/2020   AST 20 09/14/2020    ALT 18 09/14/2020   PROT 7.6 09/14/2020   ALBUMIN 4.1 09/14/2020   CALCIUM 9.3 09/14/2020   ANIONGAP 7 01/17/2018   Lab Results  Component Value Date   CHOL 163 09/14/2020   Lab Results  Component Value Date   HDL 33 (L) 09/14/2020   Lab Results  Component Value Date   LDLCALC 113 (H) 09/14/2020   Lab Results  Component Value Date   TRIG 92 09/14/2020   Lab Results  Component Value Date   CHOLHDL 4.9 (H) 09/14/2020   Lab Results  Component Value Date   HGBA1C 5.7 (H) 09/14/2020      Assessment & Plan:   Problem List Items Addressed This Visit      Annual physical exam - Primary   Annual exam as documented. Counseling done  re healthy lifestyle involving commitment to 150 minutes exercise per week, heart healthy diet, and attaining healthy weight.The importance of adequate sleep also discussed. Changes in health habits are decided on by the patient with goals and time frames  set for achieving them. Immunization and cancer screening needs are specifically addressed at this visit.     Relevant Orders  CBC with Differential/Platelet  CMP14+EGFR  Lipid panel  TSH    Cardiovascular and Mediastinum   Hypertension    BP: 118/70   Continue Lisinopril and HCTZ Advised to stay compliant with medications Advised DASH diet and moderate exercise as tolerated F/u CMP and lipid profile      Relevant Orders   CBC with Differential/Platelet   CMP14+EGFR     Musculoskeletal and Integument   Arthritis  Had referred to Rheumatology for evaluation Follows up with Orthopedic surgery for OA Planned to have MRI knee for evaluation of possible ligament tear Continue Tylenol/Ibuprofen and simple hand exercises        Other   Anxiety    Well-controlled with BuSpar and Xanax as needed.      Vitamin D deficiency    Last vitamin D Lab Results  Component Value Date   VD25OH 17.1 (L) 09/14/2020   On Vitamin D 50,000 IU every week      Relevant Orders    Vitamin D (25 hydroxy)      Mixed hyperlipidemia    Lipid profile reviewed Follow DASH diet If persistently elevated, will start statin       Other Visit Diagnoses    History of excessive cerumen       Need for hepatitis C screening test       Relevant Orders   Hepatitis C Antibody   Encounter for screening for HIV       Relevant Orders   HIV antibody (with reflex)      No orders of the defined types were placed in this encounter.   Follow-up: Return in about 6 months (around 06/17/2021) for HTN and blood tests review.    Lindell Spar, MD

## 2020-12-15 NOTE — Assessment & Plan Note (Signed)
Had referred to Rheumatology for evaluation Follows up with Orthopedic surgery for OA Planned to have MRI knee for evaluation of possible ligament tear Continue Tylenol/Ibuprofen and simple hand exercises

## 2020-12-15 NOTE — Assessment & Plan Note (Signed)
Annual exam as documented. Counseling done  re healthy lifestyle involving commitment to 150 minutes exercise per week, heart healthy diet, and attaining healthy weight.The importance of adequate sleep also discussed. Changes in health habits are decided on by the patient with goals and time frames  set for achieving them. Immunization and cancer screening needs are specifically addressed at this visit. 

## 2020-12-15 NOTE — Assessment & Plan Note (Signed)
Last vitamin D Lab Results  Component Value Date   VD25OH 17.1 (L) 09/14/2020   On Vitamin D 50,000 IU every week

## 2020-12-16 ENCOUNTER — Telehealth: Payer: Self-pay

## 2020-12-16 NOTE — Telephone Encounter (Signed)
Would like to see Dr Felipe Drone instead Laural Golden. Call back # [phone].

## 2020-12-20 ENCOUNTER — Other Ambulatory Visit: Payer: Self-pay | Admitting: Orthopedic Surgery

## 2020-12-20 NOTE — Telephone Encounter (Signed)
Patient requests refill if possible; requested also to reschedule her follow up appointment from 12/22/20 to 12/29/20, due to work:  Medication HYDROcodone-acetaminophen (NORCO/VICODIN) 5-325 MG tablet 15 tablet  [employer]

## 2020-12-22 ENCOUNTER — Ambulatory Visit: Payer: 59 | Admitting: Orthopedic Surgery

## 2020-12-22 ENCOUNTER — Other Ambulatory Visit: Payer: Self-pay | Admitting: Orthopedic Surgery

## 2020-12-22 NOTE — Telephone Encounter (Signed)
Dr Aline Brochure already denied this, have received again.

## 2020-12-26 ENCOUNTER — Other Ambulatory Visit: Payer: Self-pay

## 2020-12-26 ENCOUNTER — Other Ambulatory Visit (HOSPITAL_COMMUNITY)
Admission: RE | Admit: 2020-12-26 | Discharge: 2020-12-26 | Disposition: A | Discharge status: Home / Self Care | Payer: 59 | Source: Ambulatory Visit | Attending: Obstetrics & Gynecology | Admitting: Obstetrics & Gynecology

## 2020-12-26 ENCOUNTER — Ambulatory Visit (INDEPENDENT_AMBULATORY_CARE_PROVIDER_SITE_OTHER): Payer: 59 | Admitting: Obstetrics & Gynecology

## 2020-12-26 ENCOUNTER — Encounter: Payer: Self-pay | Admitting: Obstetrics & Gynecology

## 2020-12-26 VITALS — BP 102/65 | HR 92 | Ht 68.0 in | Wt 262.0 lb

## 2020-12-26 DIAGNOSIS — Z975 Presence of (intrauterine) contraceptive device: Secondary | ICD-10-CM

## 2020-12-26 DIAGNOSIS — Z1231 Encounter for screening mammogram for malignant neoplasm of breast: Secondary | ICD-10-CM | POA: Diagnosis not present

## 2020-12-26 DIAGNOSIS — Z01419 Encounter for gynecological examination (general) (routine) without abnormal findings: Secondary | ICD-10-CM

## 2020-12-26 NOTE — Patient Instructions (Signed)
Please schedule a mammogram at one of the following locations:  [place]: [phone]  [facility] in [place]:[phone] [address]  

## 2020-12-26 NOTE — Progress Notes (Signed)
WELL-WOMAN EXAMINATION Patient name: Brenda Taylor MRN [id number]  Date of birth: Nov 24, 1970 Chief Complaint:   New Patient (Initial Visit) and Gynecologic Exam  History of Present Illness:   Brenda Taylor is a 50 y.o. G61P2002 female being seen today for a routine well-woman exam.    Today she notes: She had Mirena placed about 8 yrs ago.  Previously had Mirena due to AUB.  In the past 8 mos on occasion will have spotting, but denies regular bleeding.  + hot flashes and mood changes  Denies pelvic or abdominal pain. Denies vaginal discharge, itching or irritation.  Previously lost ~200lbs, after her husband diet- due to depression gain a lot of the weight back. She is just now starting to get back to things. Works as a Marketing executive   No LMP recorded (lmp unknown). (Menstrual status: IUD). Denies issues with her menses The current method of family planning is postmenopausal  Last pap several years.  Last mammogram: not sure. Last colonoscopy: followed by GI  Depression screen [facility] 2/9 12/26/2020 12/15/2020 11/11/2020 08/31/2020 06/01/2020  Decreased Interest 1 0 0 0 1  Down, Depressed, Hopeless 1 0 0 0 1  PHQ - 2 Score 2 0 0 0 2  Altered sleeping 1 - - - 1  Tired, decreased energy 1 - - - 1  Change in appetite 1 - - - 0  Feeling bad or failure about yourself  0 - - - 0  Trouble concentrating 0 - - - 0  Moving slowly or fidgety/restless 0 - - - 0  Suicidal thoughts 0 - - - 0  PHQ-9 Score 5 - - - 4  Difficult doing work/chores - - - - Somewhat difficult      Review of Systems:   Pertinent items are noted in HPI Denies any headaches, blurred vision, fatigue, shortness of breath, chest pain, abdominal pain, bowel movements, urination, or intercourse unless otherwise stated above.  Pertinent History Reviewed:  Reviewed past medical,surgical, social and family history.  Reviewed problem list, medications and allergies. Physical Assessment:   Vitals:   12/26/20 1539  BP:  102/65  Pulse: 92  Weight: 262 lb (118.8 kg)  Height: 5\' 8"  (1.727 m)  Body mass index is 39.84 kg/m.        Physical Examination:   General appearance - well appearing, and in no distress  Mental status - alert, oriented to person, place, and time  Psych:  She has a normal mood and affect  Skin - warm and dry, normal color, no suspicious lesions noted  Chest - effort normal, all lung fields clear to auscultation bilaterally  Heart - normal rate and regular rhythm  Neck:  midline trachea, no thyromegaly or nodules  Breasts - breasts appear normal, no suspicious masses, no skin or nipple changes or  axillary nodes  Abdomen - obese, soft, nontender, nondistended, no masses or organomegaly  Pelvic - VULVA: normal appearing vulva with no masses, tenderness or lesions  VAGINA: normal appearing vagina with normal color and discharge, no lesions  CERVIX: normal appearing cervix without discharge or lesions, no CMT, strings visualized at os  Thin prep pap is done with HR HPV cotesting  UTERUS: uterus is felt to be normal size, shape, consistency and nontender   ADNEXA: No adnexal masses or tenderness noted, limited exam due to body habitus  Extremities:  No swelling or varicosities noted  Chaperone: Lendon Collar, LPN     Assessment & Plan:  1)  Well-Woman Exam -pap collected -mammogram ordered  2) AUB Mirena expired, plan for replacement since pt is still having occasional spotting Due to age and continue bleeding- may also consider EMB to r/o underlying etiology []  plan to replace next visit  Orders Placed This Encounter  Procedures  . MM 3D SCREEN BREAST BILATERAL    Meds: No orders of the defined types were placed in this encounter.   Follow-up: Return in about 2 weeks (around 01/09/2021) for IUD replacement, print AVS.   Janyth Pupa, DO Attending [place], [place] for [facility], [place]

## 2020-12-29 ENCOUNTER — Ambulatory Visit: Payer: 59 | Admitting: Orthopedic Surgery

## 2020-12-29 ENCOUNTER — Other Ambulatory Visit: Payer: Self-pay

## 2020-12-29 ENCOUNTER — Encounter: Payer: Self-pay | Admitting: Orthopedic Surgery

## 2020-12-29 VITALS — Ht 68.0 in | Wt 262.0 lb

## 2020-12-29 DIAGNOSIS — G8929 Other chronic pain: Secondary | ICD-10-CM

## 2020-12-29 DIAGNOSIS — M25562 Pain in left knee: Secondary | ICD-10-CM | POA: Diagnosis not present

## 2020-12-29 MED ORDER — HYDROCODONE-ACETAMINOPHEN 5-325 MG PO TABS: 1.0000 | ORAL_TABLET | Freq: Four times a day (QID) | ORAL | 0 refills | Status: AC | PRN

## 2020-12-29 NOTE — Patient Instructions (Addendum)
While we are working on your approval for MRI please go ahead and call to schedule your appointment with [facility] within at least one (1) week.   Scheduling [phone]

## 2020-12-29 NOTE — Progress Notes (Signed)
Chief Complaint  Patient presents with   Follow-up    Recheck on left knee   Alex presented to [place] a few weeks back with acute knee pain injuring her knee stepping off of a deck  Date of injury was November 06, 2020  We treated her with rest ice and ibuprofen hydrocodone gabapentin  She comes back now with no improvement  Pain seems to localize around the anterolateral knee joint line and medial and lateral patellar tendon  Recommend   MRI open unit left knee differential diagnosis patellar tendon partial tear lateral meniscal tear  Meds ordered this encounter  Medications   HYDROcodone-acetaminophen (NORCO/VICODIN) 5-325 MG tablet    Sig: Take 1 tablet by mouth every 6 (six) hours as needed for up to 5 days for moderate pain.    Dispense:  20 tablet    Refill:  0

## 2020-12-30 ENCOUNTER — Other Ambulatory Visit: Payer: Self-pay | Admitting: Internal Medicine

## 2020-12-30 DIAGNOSIS — I10 Essential (primary) hypertension: Secondary | ICD-10-CM

## 2020-12-30 LAB — CYTOLOGY - PAP
Comment: NEGATIVE
Comment: NEGATIVE
Diagnosis: HIGH — AB
HPV 16: NEGATIVE
HPV 18 / 45: NEGATIVE
High risk HPV: POSITIVE — AB

## 2021-01-03 ENCOUNTER — Telehealth: Payer: Self-pay | Admitting: Obstetrics & Gynecology

## 2021-01-03 NOTE — Telephone Encounter (Signed)
Patient called back, explained that Dr. Nelda Marseille tried to call & that she will be back in the office on Monday.  Pt understands it may be Monday before Dr. Nelda Marseille attempts to contact her again

## 2021-01-06 ENCOUNTER — Other Ambulatory Visit: Payer: Self-pay | Admitting: Internal Medicine

## 2021-01-06 DIAGNOSIS — F419 Anxiety disorder, unspecified: Secondary | ICD-10-CM

## 2021-01-09 NOTE — Progress Notes (Signed)
    Brenda Taylor is a 50 y.o. 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  In the past 8 mos on occasion will have spotting, but denies regular bleeding.  Today she notes that she had a few days of light spotting after her pap, but minimal.  Denies moderate to heavy bleeding.  + hot flashes and mood changes   Denies pelvic or abdominal pain. Denies vaginal discharge, itching or irritation. No acute complaints or changes since her prior visit.  She does note some anxiety regarding her recent pap as she takes care of 2 young children and she worries about being here for them.    Last time, she discussed weight gain due to depression after her husband's death- just now trying to get back to things Works as a Training and development officer @ [employer]  Pertinent History Reviewed:   Reviewed past medical,surgical, social, obstetrical and family history.  Reviewed problem list, medications and allergies. Objective Findings & Procedure:   Vitals:   01/11/21 1055  BP: 124/85  Pulse: 96  Weight: 260 lb 6.4 oz (118.1 kg)  Height: 5\' 8"  (1.727 m)  Body mass index is 39.59 kg/m.  Results for orders placed or performed in visit on 01/11/21 (from the past 24 hour(s))  POCT urine pregnancy   Collection Time: 01/11/21 10:58 AM  Result Value Ref Range   Preg Test, Ur Negative Negative    Colposcopy and IUD replacement Procedure Note  Smoker:  Yes.   New sexual partner:  No.  History of abnormal Pap: no  Procedure Details  The risks and benefits of the procedure and Written informed consent obtained.  Speculum placed in vagina and visualization of cervix achieved with difficulty due to patient's body habitus.  IUD grasped and removed without problems.  Cervix swabbed x 3 with acetic acid solution.  Findings: TMZ visualized.   Cervix: acetowhite lesion(s) noted at 12 o'clock;  endocervical curettage performed and cervical biopsies taken at 4,7 and 12 o'clock.  Specimens: ECC and cervical biopsies  Complications: none.   Following biopsies, cervix was prepped with betadine.  Cervix was sounded to 6cm, Mirena placed without difficulty.  Strings trimmed.  Monsel's placed.  Excellent hemostasis was noted.  All instruments were removed.  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  Colposcopy completed with biopsies as above- further management pending results of pathology -Discussed next step including potential for LEEP should CIN 2/3 be present  AUB -Mirena placed without difficulty, f/u in 6wks for string check

## 2021-01-11 ENCOUNTER — Other Ambulatory Visit: Payer: Self-pay

## 2021-01-11 ENCOUNTER — Encounter: Payer: Self-pay | Admitting: Obstetrics & Gynecology

## 2021-01-11 ENCOUNTER — Ambulatory Visit (INDEPENDENT_AMBULATORY_CARE_PROVIDER_SITE_OTHER): Payer: 59 | Admitting: Obstetrics & Gynecology

## 2021-01-11 ENCOUNTER — Other Ambulatory Visit (HOSPITAL_COMMUNITY)
Admission: RE | Admit: 2021-01-11 | Discharge: 2021-01-11 | Disposition: A | Discharge status: Home / Self Care | Payer: 59 | Source: Ambulatory Visit | Attending: Obstetrics & Gynecology | Admitting: Obstetrics & Gynecology

## 2021-01-11 VITALS — BP 124/85 | HR 96 | Ht 68.0 in | Wt 260.4 lb

## 2021-01-11 DIAGNOSIS — N939 Abnormal uterine and vaginal bleeding, unspecified: Secondary | ICD-10-CM

## 2021-01-11 DIAGNOSIS — R87613 High grade squamous intraepithelial lesion on cytologic smear of cervix (HGSIL): Secondary | ICD-10-CM | POA: Insufficient documentation

## 2021-01-11 DIAGNOSIS — Z30430 Encounter for insertion of intrauterine contraceptive device: Secondary | ICD-10-CM | POA: Diagnosis not present

## 2021-01-11 DIAGNOSIS — Z30433 Encounter for removal and reinsertion of intrauterine contraceptive device: Secondary | ICD-10-CM

## 2021-01-11 LAB — POCT URINE PREGNANCY: Preg Test, Ur: NEGATIVE

## 2021-01-11 MED ORDER — LEVONORGESTREL 20 MCG/DAY IU IUD
1.0000 | INTRAUTERINE_SYSTEM | Freq: Once | INTRAUTERINE | Status: AC
  Administered 2021-01-11: 1 via INTRAUTERINE

## 2021-01-11 NOTE — Addendum Note (Signed)
Addended by: Janece Canterbury on: 01/11/2021 12:03 PM   Modules accepted: Orders

## 2021-01-12 ENCOUNTER — Ambulatory Visit: Payer: 59 | Admitting: Orthopedic Surgery

## 2021-01-12 LAB — SURGICAL PATHOLOGY

## 2021-01-25 ENCOUNTER — Other Ambulatory Visit: Payer: Self-pay | Admitting: Internal Medicine

## 2021-01-25 ENCOUNTER — Other Ambulatory Visit: Payer: Self-pay | Admitting: Orthopedic Surgery

## 2021-01-30 ENCOUNTER — Other Ambulatory Visit: Payer: Self-pay | Admitting: Internal Medicine

## 2021-01-30 DIAGNOSIS — I10 Essential (primary) hypertension: Secondary | ICD-10-CM

## 2021-02-03 ENCOUNTER — Other Ambulatory Visit: Payer: Self-pay | Admitting: Internal Medicine

## 2021-02-03 DIAGNOSIS — I10 Essential (primary) hypertension: Secondary | ICD-10-CM

## 2021-02-03 DIAGNOSIS — F419 Anxiety disorder, unspecified: Secondary | ICD-10-CM

## 2021-02-07 ENCOUNTER — Other Ambulatory Visit: Payer: Self-pay | Admitting: Internal Medicine

## 2021-02-09 NOTE — Patient Instructions (Signed)
Brenda Taylor  02/09/2021     @PREFPERIOPPHARMACY @   Your procedure is scheduled on  02/14/2021.   Report to [facility] at  [phone]  A.M.   Call this number if you have problems the morning of surgery:  [phone]   Remember:  Do not eat or drink after midnight.      Take these medicines the morning of surgery with A SIP OF WATER    buspar, gabapentin.     Do not wear jewelry, make-up or nail polish.  Do not wear lotions, powders, or perfumes, or deodorant.  Do not shave 48 hours prior to surgery.  Men may shave face and neck.  Do not bring valuables to the hospital.  [facility] is not responsible for any belongings or valuables.  Contacts, dentures or bridgework may not be worn into surgery.  Leave your suitcase in the car.  After surgery it may be brought to your room.  For patients admitted to the hospital, discharge time will be determined by your treatment team.  Patients discharged the day of surgery will not be allowed to drive home and must have someone with them for 24 hours.    Special instructions:    DO NOT smoke tobacco or vape for 24 hours before your procedure.  Please read over the following fact sheets that you were given. Pain Booklet, Coughing and Deep Breathing, Surgical Site Infection Prevention, Anesthesia Post-op Instructions, and Care and Recovery After Surgery         LEEP POST-PROCEDURE INSTRUCTIONS  You may take Ibuprofen, Aleve or Tylenol for pain if needed.  Cramping is normal.  You will have black and/or bloody discharge at first.  This will lighten and then turn clear before completely resolving.  This will take 2 to 3 weeks.  Put nothing in your vagina until the bleeding or discharge stops (usually 2 or3 days).  You need to call if you have redness around the biopsy site, if there is any unusual draining, if the bleeding is heavy, or if you are concerned.  Shower or bathe as normal  We will call you within one week with  results or we will discuss the results at your follow-up appointment if needed.  You will need to return for a follow-up Pap smear as directed by your physician.General Anesthesia, Adult, Care After This sheet gives you information about how to care for yourself after your procedure. Your health care provider may also give you more specific instructions. If you have problems or questions, contact your health careprovider. What can I expect after the procedure? After the procedure, the following side effects are common: Pain or discomfort at the IV site. Nausea. Vomiting. Sore throat. Trouble concentrating. Feeling cold or chills. Feeling weak or tired. Sleepiness and fatigue. Soreness and body aches. These side effects can affect parts of the body that were not involved in surgery. Follow these instructions at home: For the time period you were told by your health care provider:  Rest. Do not participate in activities where you could fall or become injured. Do not drive or use machinery. Do not drink alcohol. Do not take sleeping pills or medicines that cause drowsiness. Do not make important decisions or sign legal documents. Do not take care of children on your own.  Eating and drinking Follow any instructions from your health care provider about eating or drinking restrictions. When you feel hungry, start by eating small amounts of foods that  are soft and easy to digest (bland), such as toast. Gradually return to your regular diet. Drink enough fluid to keep your urine pale yellow. If you vomit, rehydrate by drinking water, juice, or clear broth. General instructions If you have sleep apnea, surgery and certain medicines can increase your risk for breathing problems. Follow instructions from your health care provider about wearing your sleep device: Anytime you are sleeping, including during daytime naps. While taking prescription pain medicines, sleeping medicines, or medicines  that make you drowsy. Have a responsible adult stay with you for the time you are told. It is important to have someone help care for you until you are awake and alert. Return to your normal activities as told by your health care provider. Ask your health care provider what activities are safe for you. Take over-the-counter and prescription medicines only as told by your health care provider. If you smoke, do not smoke without supervision. Keep all follow-up visits as told by your health care provider. This is important. Contact a health care provider if: You have nausea or vomiting that does not get better with medicine. You cannot eat or drink without vomiting. You have pain that does not get better with medicine. You are unable to pass urine. You develop a skin rash. You have a fever. You have redness around your IV site that gets worse. Get help right away if: You have difficulty breathing. You have chest pain. You have blood in your urine or stool, or you vomit blood. Summary After the procedure, it is common to have a sore throat or nausea. It is also common to feel tired. Have a responsible adult stay with you for the time you are told. It is important to have someone help care for you until you are awake and alert. When you feel hungry, start by eating small amounts of foods that are soft and easy to digest (bland), such as toast. Gradually return to your regular diet. Drink enough fluid to keep your urine pale yellow. Return to your normal activities as told by your health care provider. Ask your health care provider what activities are safe for you. This information is not intended to replace advice given to you by your health care provider. Make sure you discuss any questions you have with your healthcare provider. Document Revised: 03/24/2020 Document Reviewed: 10/22/2019 Elsevier Patient Education  2022 [place]. How to Use Chlorhexidine for Bathing Chlorhexidine  gluconate (CHG) is a germ-killing (antiseptic) solution that is used to clean the skin. It can get rid of the bacteria that normally live on the skin and can keep them away for about 24 hours. To clean your skin with CHG, you may be given: A CHG solution to use in the shower or as part of a sponge bath. A prepackaged cloth that contains CHG. Cleaning your skin with CHG may help lower the risk for infection: While you are staying in the intensive care unit of the hospital. If you have a vascular access, such as a central line, to provide short-term or long-term access to your veins. If you have a catheter to drain urine from your bladder. If you are on a ventilator. A ventilator is a machine that helps you breathe by moving air in and out of your lungs. After surgery. What are the risks? Risks of using CHG include: A skin reaction. Hearing loss, if CHG gets in your ears. Eye injury, if CHG gets in your eyes and is not rinsed out.  The CHG product catching fire. Make sure that you avoid smoking and flames after applying CHG to your skin. Do not use CHG: If you have a chlorhexidine allergy or have previously reacted to chlorhexidine. On babies younger than 42 months of age. How to use CHG solution Use CHG only as told by your health care provider, and follow the instructions on the label. Use the full amount of CHG as directed. Usually, this is one bottle. During a shower Follow these steps when using CHG solution during a shower (unless your health care provider gives you different instructions): Start the shower. Use your normal soap and shampoo to wash your face and hair. Turn off the shower or move out of the shower stream. Pour the CHG onto a clean washcloth. Do not use any type of brush or rough-edged sponge. Starting at your neck, lather your body down to your toes. Make sure you follow these instructions: If you will be having surgery, pay special attention to the part of your body  where you will be having surgery. Scrub this area for at least 1 minute. Do not use CHG on your head or face. If the solution gets into your ears or eyes, rinse them well with water. Avoid your genital area. Avoid any areas of skin that have broken skin, cuts, or scrapes. Scrub your back and under your arms. Make sure to wash skin folds. Let the lather sit on your skin for 1-2 minutes or as long as told by your health care provider. Thoroughly rinse your entire body in the shower. Make sure that all body creases and crevices are rinsed well. Dry off with a clean towel. Do not put any substances on your body afterward--such as powder, lotion, or perfume--unless you are told to do so by your health care provider. Only use lotions that are recommended by the manufacturer. Put on clean clothes or pajamas. If it is the night before your surgery, sleep in clean sheets.  During a sponge bath Follow these steps when using CHG solution during a sponge bath (unless your health care provider gives you different instructions): Use your normal soap and shampoo to wash your face and hair. Pour the CHG onto a clean washcloth. Starting at your neck, lather your body down to your toes. Make sure you follow these instructions: If you will be having surgery, pay special attention to the part of your body where you will be having surgery. Scrub this area for at least 1 minute. Do not use CHG on your head or face. If the solution gets into your ears or eyes, rinse them well with water. Avoid your genital area. Avoid any areas of skin that have broken skin, cuts, or scrapes. Scrub your back and under your arms. Make sure to wash skin folds. Let the lather sit on your skin for 1-2 minutes or as long as told by your health care provider. Using a different clean, wet washcloth, thoroughly rinse your entire body. Make sure that all body creases and crevices are rinsed well. Dry off with a clean towel. Do not put any  substances on your body afterward--such as powder, lotion, or perfume--unless you are told to do so by your health care provider. Only use lotions that are recommended by the manufacturer. Put on clean clothes or pajamas. If it is the night before your surgery, sleep in clean sheets. How to use CHG prepackaged cloths Only use CHG cloths as told by your health care provider, and  follow the instructions on the label. Use the CHG cloth on clean, dry skin. Do not use the CHG cloth on your head or face unless your health care provider tells you to. When washing with the CHG cloth: Avoid your genital area. Avoid any areas of skin that have broken skin, cuts, or scrapes. Before surgery Follow these steps when using a CHG cloth to clean before surgery (unless your health care provider gives you different instructions): Using the CHG cloth, vigorously scrub the part of your body where you will be having surgery. Scrub using a back-and-forth motion for 3 minutes. The area on your body should be completely wet with CHG when you are done scrubbing. Do not rinse. Discard the cloth and let the area air-dry. Do not put any substances on the area afterward, such as powder, lotion, or perfume. Put on clean clothes or pajamas. If it is the night before your surgery, sleep in clean sheets.  For general bathing Follow these steps when using CHG cloths for general bathing (unless your health care provider gives you different instructions). Use a separate CHG cloth for each area of your body. Make sure you wash between any folds of skin and between your fingers and toes. Wash your body in the following order, switching to a new cloth after each step: The front of your neck, shoulders, and chest. Both of your arms, under your arms, and your hands. Your stomach and groin area, avoiding the genitals. Your right leg and foot. Your left leg and foot. The back of your neck, your back, and your buttocks. Do not rinse.  Discard the cloth and let the area air-dry. Do not put any substances on your body afterward--such as powder, lotion, or perfume--unless you are told to do so by your health care provider. Only use lotions that are recommended by the manufacturer. Put on clean clothes or pajamas. Contact a health care provider if: Your skin gets irritated after scrubbing. You have questions about using your solution or cloth. Get help right away if: Your eyes become very red or swollen. Your eyes itch badly. Your skin itches badly and is red or swollen. Your hearing changes. You have trouble seeing. You have swelling or tingling in your mouth or throat. You have trouble breathing. You swallow any chlorhexidine. Summary Chlorhexidine gluconate (CHG) is a germ-killing (antiseptic) solution that is used to clean the skin. Cleaning your skin with CHG may help to lower your risk for infection. You may be given CHG to use for bathing. It may be in a bottle or in a prepackaged cloth to use on your skin. Carefully follow your health care provider's instructions and the instructions on the product label. Do not use CHG if you have a chlorhexidine allergy. Contact your health care provider if your skin gets irritated after scrubbing. This information is not intended to replace advice given to you by your health care provider. Make sure you discuss any questions you have with your healthcare provider. Document Revised: 11/20/2019 Document Reviewed: 12/25/2019 Elsevier Patient Education  [place].

## 2021-02-10 ENCOUNTER — Encounter (HOSPITAL_COMMUNITY)
Admission: RE | Admit: 2021-02-10 | Discharge: 2021-02-10 | Disposition: A | Discharge status: Home / Self Care | Payer: 59 | Source: Ambulatory Visit | Attending: Obstetrics & Gynecology | Admitting: Obstetrics & Gynecology

## 2021-02-10 ENCOUNTER — Encounter (HOSPITAL_COMMUNITY): Payer: Self-pay

## 2021-02-10 ENCOUNTER — Other Ambulatory Visit: Payer: Self-pay

## 2021-02-10 DIAGNOSIS — Z01818 Encounter for other preprocedural examination: Secondary | ICD-10-CM | POA: Diagnosis not present

## 2021-02-10 HISTORY — DX: Gastric ulcer, unspecified as acute or chronic, without hemorrhage or perforation: K25.9

## 2021-02-10 LAB — CBC WITH DIFFERENTIAL/PLATELET
Abs Immature Granulocytes: 0.05 10*3/uL (ref 0.00–0.07)
Basophils Absolute: 0.1 10*3/uL (ref 0.0–0.1)
Basophils Relative: 1 %
Eosinophils Absolute: 0.4 10*3/uL (ref 0.0–0.5)
Eosinophils Relative: 3 %
HCT: 37.2 % (ref 36.0–46.0)
Hemoglobin: 12.2 g/dL (ref 12.0–15.0)
Immature Granulocytes: 0 %
Lymphocytes Relative: 29 %
Lymphs Abs: 3.3 10*3/uL (ref 0.7–4.0)
MCH: 31.4 pg (ref 26.0–34.0)
MCHC: 32.8 g/dL (ref 30.0–36.0)
MCV: 95.6 fL (ref 80.0–100.0)
Monocytes Absolute: 0.9 10*3/uL (ref 0.1–1.0)
Monocytes Relative: 8 %
Neutro Abs: 6.6 10*3/uL (ref 1.7–7.7)
Neutrophils Relative %: 59 %
Platelets: 308 10*3/uL (ref 150–400)
RBC: 3.89 MIL/uL (ref 3.87–5.11)
RDW: 14 % (ref 11.5–15.5)
WBC: 11.2 10*3/uL — ABNORMAL HIGH (ref 4.0–10.5)
nRBC: 0 % (ref 0.0–0.2)

## 2021-02-10 LAB — BASIC METABOLIC PANEL
Anion gap: 7 (ref 5–15)
BUN: 26 mg/dL — ABNORMAL HIGH (ref 6–20)
CO2: 26 mmol/L (ref 22–32)
Calcium: 8.8 mg/dL — ABNORMAL LOW (ref 8.9–10.3)
Chloride: 104 mmol/L (ref 98–111)
Creatinine, Ser: 1.06 mg/dL — ABNORMAL HIGH (ref 0.44–1.00)
GFR, Estimated: >60 mL/min (ref >60)
Glucose, Bld: 91 mg/dL (ref 70–99)
Potassium: 4.2 mmol/L (ref 3.5–5.1)
Sodium: 137 mmol/L (ref 135–145)

## 2021-02-10 LAB — HCG, SERUM, QUALITATIVE: Preg, Serum: NEGATIVE

## 2021-02-12 NOTE — H&P (Addendum)
[facility] Attending History and Physical  Brenda Taylor is a 50 y.o. [id number] who presents for LEEP due to cervical dysplasia.  Recent colposcopy- 6/22- CIN 2 noted at [facility] and 12 o'clock.  Other biopsies with CIN 1.  Additionally she reports AUB- Mirena replaced on 6/22.  She denies vaginal discharge, fevers, chills, sweats, dysuria, nausea, vomiting, other GI or GU symptoms or other general symptoms.  Past Medical History:  Diagnosis Date   Anxiety    Chronic pain    DDD (degenerative disc disease)    HBP (high blood pressure)    Hernia    Multiple gastric ulcers    Rotator cuff syndrome of left shoulder 10/09/2011   Ulcer disease    Past Surgical History:  Procedure Laterality Date   APPENDECTOMY     BACK SURGERY     x 4   CARPAL TUNNEL RELEASE     bilateral   CHOLECYSTECTOMY     COLONOSCOPY  10/20/2009   [id number] anal canal/left-side diverticula and multiple polyps. poor prep compromised exam. Next TCS 09/2014.   ESOPHAGOGASTRODUODENOSCOPY  03/06/2010   [id number] quadrant distal esophageal/patent tubular esophagus/small HH, antral erosions and ulcerations. Bx negative for persistent H.pylori   ESOPHAGOGASTRODUODENOSCOPY  10/20/2009   [id number] HH/prepyloric antral ulcer s/p bx (+h.pylori), ERE   INTRAUTERINE DEVICE INSERTION     MASTECTOMY, PARTIAL     x 2   MOUTH SURGERY     ROTATOR CUFF REPAIR     right   TUBAL LIGATION     OB History  Gravida Para Term Preterm AB Living  '2 2 2     2  '$ SAB IAB Ectopic Multiple Live Births          2    # Outcome Date GA Lbr Len/2nd Weight Sex Delivery Anes PTL Lv  2 Term           1 Term           Patient denies any other pertinent gynecologic issues.  No current facility-administered medications on file prior to encounter.   Current Outpatient Medications on File Prior to Encounter  Medication Sig Dispense Refill   acetaminophen (TYLENOL) 500 MG tablet Take 2 tablets (1,000 mg total) by mouth  every 6 (six) hours as needed. 90 tablet 0   Aspirin-Salicylamide-Caffeine (BC HEADACHE POWDER PO) Take 1 Package by mouth daily as needed (headache/pain).     busPIRone (BUSPAR) 15 MG tablet TAKE 1 TABLET BY MOUTH 3 TIMES DAILY. (Patient taking differently: Take 15 mg by mouth in the morning.) 90 tablet 2   Vitamin D, Ergocalciferol, (DRISDOL) 1.25 MG (50000 UNIT) CAPS capsule Take 50,000 Units by mouth every Tuesday.     Allergies  Allergen Reactions   Amoxicillin     Reaction is unknown   Aspirin     Cannot take due to ulcers    Social History:   reports that she has been smoking cigarettes. She has been smoking an average of .75 packs per day. She has never used smokeless tobacco. She reports current drug use. Frequency: 7.00 times per week. Drug: Marijuana. She reports that she does not drink alcohol. Family History  Problem Relation Age of Onset   Heart disease Other    Arthritis Other    Cancer Other    Asthma Other    Diabetes Other    Heart disease Mother    Diabetes Mother    Cancer Mother    Non-Hodgkin's lymphoma Mother  Hypertension Mother    Arthritis Mother    Lung cancer Father    Neuropathy Father    Hypertension Father     Review of Systems: Pertinent items noted in HPI and remainder of comprehensive ROS otherwise negative.  PHYSICAL EXAM: BP 126/74 (BP Location: Right Arm)   Pulse (!) 103   Temp 98.7 F (37.1 C)   Resp (!) 26   SpO2 99%  Gen: NAD CV: RRR Lungs: CTAB Abd: soft, non-tender, no rebound, no guarding Ext: no calf tenderness, SCDs in place Psych: mood appropriate  Labs: Results for orders placed or performed during the hospital encounter of 02/10/21 (from the past 336 hour(s))  CBC with Differential/Platelet   Collection Time: 02/10/21 11:31 AM  Result Value Ref Range   WBC 11.2 (H) 4.0 - 10.5 K/uL   RBC 3.89 3.87 - 5.11 MIL/uL   Hemoglobin 12.2 12.0 - 15.0 g/dL   HCT 37.2 36.0 - 46.0 %   MCV 95.6 80.0 - 100.0 fL   MCH 31.4  26.0 - 34.0 pg   MCHC 32.8 30.0 - 36.0 g/dL   RDW 14.0 11.5 - 15.5 %   Platelets 308 150 - 400 K/uL   nRBC 0.0 0.0 - 0.2 %   Neutrophils Relative % 59 %   Neutro Abs 6.6 1.7 - 7.7 K/uL   Lymphocytes Relative 29 %   Lymphs Abs 3.3 0.7 - 4.0 K/uL   Monocytes Relative 8 %   Monocytes Absolute 0.9 0.1 - 1.0 K/uL   Eosinophils Relative 3 %   Eosinophils Absolute 0.4 0.0 - 0.5 K/uL   Basophils Relative 1 %   Basophils Absolute 0.1 0.0 - 0.1 K/uL   Immature Granulocytes 0 %   Abs Immature Granulocytes 0.05 0.00 - 0.07 K/uL  Basic metabolic panel   Collection Time: 02/10/21 11:31 AM  Result Value Ref Range   Sodium 137 135 - 145 mmol/L   Potassium 4.2 3.5 - 5.1 mmol/L   Chloride 104 98 - 111 mmol/L   CO2 26 22 - 32 mmol/L   Glucose, Bld 91 70 - 99 mg/dL   BUN 26 (H) 6 - 20 mg/dL   Creatinine, Ser 1.06 (H) 0.44 - 1.00 mg/dL   Calcium 8.8 (L) 8.9 - 10.3 mg/dL   GFR, Estimated >60 >60 mL/min   Anion gap 7 5 - 15  hCG, serum, qualitative (Not at [facility])   Collection Time: 02/10/21 11:31 AM  Result Value Ref Range   Preg, Serum NEGATIVE NEGATIVE    Assessment: Cervical dysplasa- CIN 2  Plan: -NPO -LR @ 125cc/hr -plan to proceed with LEEP- reviewed risk/benefit and alternatives including but not limited to risk of bleeding, infection and potential for further intervention.  Questions and concerns were addressed and she desires to proceed   Janyth Pupa, DO Attending [place], [place] for [employer], [place]

## 2021-02-14 ENCOUNTER — Encounter (HOSPITAL_COMMUNITY): Payer: Self-pay | Admitting: Obstetrics & Gynecology

## 2021-02-14 ENCOUNTER — Ambulatory Visit (HOSPITAL_COMMUNITY): Payer: 59 | Admitting: Certified Registered Nurse Anesthetist

## 2021-02-14 ENCOUNTER — Ambulatory Visit (HOSPITAL_COMMUNITY)
Admission: RE | Admit: 2021-02-14 | Discharge: 2021-02-14 | Disposition: A | Discharge status: Home / Self Care | Payer: 59 | Attending: Obstetrics & Gynecology | Admitting: Obstetrics & Gynecology

## 2021-02-14 ENCOUNTER — Encounter (HOSPITAL_COMMUNITY)
Admission: RE | Disposition: A | Discharge status: Home / Self Care | Payer: Self-pay | Source: Home / Self Care | Attending: Obstetrics & Gynecology

## 2021-02-14 DIAGNOSIS — Z809 Family history of malignant neoplasm, unspecified: Secondary | ICD-10-CM | POA: Diagnosis not present

## 2021-02-14 DIAGNOSIS — Z79899 Other long term (current) drug therapy: Secondary | ICD-10-CM | POA: Diagnosis not present

## 2021-02-14 DIAGNOSIS — Z886 Allergy status to analgesic agent status: Secondary | ICD-10-CM | POA: Insufficient documentation

## 2021-02-14 DIAGNOSIS — N871 Moderate cervical dysplasia: Secondary | ICD-10-CM | POA: Diagnosis not present

## 2021-02-14 DIAGNOSIS — Z8711 Personal history of peptic ulcer disease: Secondary | ICD-10-CM | POA: Diagnosis not present

## 2021-02-14 DIAGNOSIS — Z801 Family history of malignant neoplasm of trachea, bronchus and lung: Secondary | ICD-10-CM | POA: Insufficient documentation

## 2021-02-14 DIAGNOSIS — F17210 Nicotine dependence, cigarettes, uncomplicated: Secondary | ICD-10-CM | POA: Diagnosis not present

## 2021-02-14 DIAGNOSIS — Z807 Family history of other malignant neoplasms of lymphoid, hematopoietic and related tissues: Secondary | ICD-10-CM | POA: Diagnosis not present

## 2021-02-14 DIAGNOSIS — Z9010 Acquired absence of unspecified breast and nipple: Secondary | ICD-10-CM | POA: Insufficient documentation

## 2021-02-14 DIAGNOSIS — Z9049 Acquired absence of other specified parts of digestive tract: Secondary | ICD-10-CM | POA: Insufficient documentation

## 2021-02-14 DIAGNOSIS — Z880 Allergy status to penicillin: Secondary | ICD-10-CM | POA: Diagnosis not present

## 2021-02-14 DIAGNOSIS — F1290 Cannabis use, unspecified, uncomplicated: Secondary | ICD-10-CM | POA: Insufficient documentation

## 2021-02-14 HISTORY — PX: LEEP: SHX91

## 2021-02-14 SURGERY — LEEP (LOOP ELECTROSURGICAL EXCISION PROCEDURE)
Anesthesia: General

## 2021-02-14 MED ORDER — ONDANSETRON HCL 4 MG/2ML IJ SOLN: 4.0000 mg | Freq: Once | INTRAMUSCULAR | Status: DC | PRN

## 2021-02-14 MED ORDER — FENTANYL CITRATE (PF) 100 MCG/2ML IJ SOLN
INTRAMUSCULAR | Status: DC | PRN
  Administered 2021-02-14 (×4): 25 ug via INTRAVENOUS

## 2021-02-14 MED ORDER — PROPOFOL 10 MG/ML IV BOLUS
INTRAVENOUS | Status: AC
  Filled 2021-02-14: qty 40

## 2021-02-14 MED ORDER — ACETIC ACID 4% SOLUTION
Status: DC | PRN
  Administered 2021-02-14: 1 via TOPICAL

## 2021-02-14 MED ORDER — ONDANSETRON HCL 4 MG/2ML IJ SOLN
INTRAMUSCULAR | Status: DC | PRN
  Administered 2021-02-14: 4 mg via INTRAVENOUS

## 2021-02-14 MED ORDER — LACTATED RINGERS IV SOLN: INTRAVENOUS | Status: DC

## 2021-02-14 MED ORDER — LACTATED RINGERS IV SOLN: INTRAVENOUS | Status: DC | PRN

## 2021-02-14 MED ORDER — FERRIC SUBSULFATE 259 MG/GM EX SOLN
CUTANEOUS | Status: AC
  Filled 2021-02-14: qty 8

## 2021-02-14 MED ORDER — PROPOFOL 10 MG/ML IV BOLUS
INTRAVENOUS | Status: AC
  Filled 2021-02-14: qty 20

## 2021-02-14 MED ORDER — MIDAZOLAM HCL 2 MG/2ML IJ SOLN
INTRAMUSCULAR | Status: AC
  Filled 2021-02-14: qty 2

## 2021-02-14 MED ORDER — PROPOFOL 10 MG/ML IV BOLUS
INTRAVENOUS | Status: DC | PRN
  Administered 2021-02-14 (×2): 20 mg via INTRAVENOUS

## 2021-02-14 MED ORDER — FENTANYL CITRATE (PF) 100 MCG/2ML IJ SOLN
INTRAMUSCULAR | Status: AC
  Filled 2021-02-14: qty 2

## 2021-02-14 MED ORDER — PROPOFOL 500 MG/50ML IV EMUL
INTRAVENOUS | Status: DC | PRN
  Administered 2021-02-14: 180 ug/kg/min via INTRAVENOUS

## 2021-02-14 MED ORDER — FENTANYL CITRATE (PF) 100 MCG/2ML IJ SOLN: 25.0000 ug | INTRAMUSCULAR | Status: DC | PRN

## 2021-02-14 MED ORDER — PHENYLEPHRINE HCL (PRESSORS) 10 MG/ML IV SOLN
INTRAVENOUS | Status: DC | PRN
  Administered 2021-02-14: 200 ug via INTRAVENOUS
  Administered 2021-02-14: 100 ug via INTRAVENOUS
  Administered 2021-02-14: 200 ug via INTRAVENOUS

## 2021-02-14 MED ORDER — DEXAMETHASONE SODIUM PHOSPHATE 10 MG/ML IJ SOLN
INTRAMUSCULAR | Status: DC | PRN
  Administered 2021-02-14: 10 mg via INTRAVENOUS

## 2021-02-14 MED ORDER — GLYCOPYRROLATE 0.2 MG/ML IJ SOLN
INTRAMUSCULAR | Status: DC | PRN
  Administered 2021-02-14: .2 mg via INTRAVENOUS

## 2021-02-14 MED ORDER — LIDOCAINE-EPINEPHRINE (PF) 1 %-1:200000 IJ SOLN
INTRAMUSCULAR | Status: DC | PRN
  Administered 2021-02-14: 10 mL

## 2021-02-14 MED ORDER — POVIDONE-IODINE 10 % EX SWAB: 2.0000 "application " | Freq: Once | CUTANEOUS | Status: DC

## 2021-02-14 MED ORDER — ORAL CARE MOUTH RINSE: 15.0000 mL | Freq: Once | OROMUCOSAL | Status: AC

## 2021-02-14 MED ORDER — MIDAZOLAM HCL 2 MG/2ML IJ SOLN
INTRAMUSCULAR | Status: DC | PRN
  Administered 2021-02-14: 2 mg via INTRAVENOUS

## 2021-02-14 MED ORDER — CHLORHEXIDINE GLUCONATE 0.12 % MT SOLN
15.0000 mL | Freq: Once | OROMUCOSAL | Status: AC
  Administered 2021-02-14: 15 mL via OROMUCOSAL

## 2021-02-14 SURGICAL SUPPLY — 15 items
COVER MAYO STAND XLG (MISCELLANEOUS) ×1 IMPLANT
ELECT BALL LEEP 5MM RED (ELECTRODE) ×1 IMPLANT
ELECT LOOP LEEP RND 20X12 WHT (CUTTING LOOP) ×2
ELECTRODE LOOP LP RND 20X12WHT (CUTTING LOOP) IMPLANT
GLOVE SURG ENC MOIS LTX SZ7 (GLOVE) ×2 IMPLANT
GLOVE SURG LTX SZ6.5 (GLOVE) ×2 IMPLANT
GLOVE SURG UNDER POLY LF SZ6.5 (GLOVE) ×2 IMPLANT
GLOVE SURG UNDER POLY LF SZ7 (GLOVE) ×2 IMPLANT
GOWN STRL REUS W/ TWL LRG LVL3 (GOWN DISPOSABLE) ×3 IMPLANT
GOWN STRL REUS W/TWL LRG LVL3 (GOWN DISPOSABLE) ×4
PACK PERI GYN (CUSTOM PROCEDURE TRAY) ×1 IMPLANT
PENCIL SMOKE EVACUATOR (MISCELLANEOUS) ×1 IMPLANT
SCOPETTES 8  STERILE (MISCELLANEOUS) ×2
SCOPETTES 8 STERILE (MISCELLANEOUS) ×2 IMPLANT
SET BASIN LINEN APH (SET/KITS/TRAYS/PACK) ×1 IMPLANT

## 2021-02-14 NOTE — Interval H&P Note (Signed)
History and Physical Interval Note:  02/14/2021 8:43 AM  Brenda Taylor  has presented today for surgery, with the diagnosis of CIN 2.  The various methods of treatment have been discussed with the patient and family. After consideration of risks, benefits and other options for treatment, the patient has consented to  Procedure(s): LOOP ELECTROSURGICAL EXCISION PROCEDURE (LEEP) (N/A) as a surgical intervention.  The patient's history has been reviewed, patient examined, no change in status, stable for surgery.  I have reviewed the patient's chart and labs.  Questions were answered to the patient's satisfaction.     Annalee Genta

## 2021-02-14 NOTE — Anesthesia Preprocedure Evaluation (Signed)
Anesthesia Evaluation  Patient identified by MRN, date of birth, ID band Patient awake    Reviewed: Allergy & Precautions, H&P , NPO status , Patient's Chart, lab work & pertinent test results, reviewed documented beta blocker date and time   Airway Mallampati: II  TM Distance: >3 FB Neck ROM: full    Dental no notable dental hx.    Pulmonary neg pulmonary ROS, Current Smoker,    Pulmonary exam normal breath sounds clear to auscultation       Cardiovascular Exercise Tolerance: Good hypertension, negative cardio ROS   Rhythm:regular Rate:Normal     Neuro/Psych PSYCHIATRIC DISORDERS Anxiety  Neuromuscular disease    GI/Hepatic Neg liver ROS, PUD,   Endo/Other  Morbid obesity  Renal/GU negative Renal ROS  negative genitourinary   Musculoskeletal   Abdominal   Peds  Hematology negative hematology ROS (+)   Anesthesia Other Findings   Reproductive/Obstetrics negative OB ROS                             Anesthesia Physical Anesthesia Plan  ASA: 3  Anesthesia Plan: General   Post-op Pain Management:    Induction:   PONV Risk Score and Plan: Propofol infusion  Airway Management Planned:   Additional Equipment:   Intra-op Plan:   Post-operative Plan:   Informed Consent: I have reviewed the patients History and Physical, chart, labs and discussed the procedure including the risks, benefits and alternatives for the proposed anesthesia with the patient or authorized representative who has indicated his/her understanding and acceptance.     Dental Advisory Given  Plan Discussed with: CRNA  Anesthesia Plan Comments:         Anesthesia Quick Evaluation

## 2021-02-14 NOTE — Op Note (Signed)
Operative Report  PreOp: CIN2 PostOp: same Procedure:  LEEP Surgeon: Dr. Janyth Pupa Anesthesia: MAC combined with regional for post-op pain Complications:none EBL: Minimal IVF:1000cc  Specimens: anterior and posterior cervix   Procedure:  The patient was placed in lithotomy position and the bivalved coated speculum was placed in the patient's vagina. A grounding pad placed on the patient. Local anesthesia was administered via an intracervical block using 10 ml of 1% Lidocaine with epinephrine. Acetic acid was placed. Strings of IUD were visualized.  The suction was turned on and the Medium 1X Fisher Cone Biopsy Excisor on 16 Watts of blended current was used to excise the entire transformation zone. Excellent hemostasis was achieved using roller ball coagulation set at 50 Watts coagulation current. Specimens were sent to pathology. Strings remained visualized following the procedure.  ?The patient tolerated the procedure well. Counts were correct.    Janyth Pupa, DO Attending [place], [facility] for [employer], [place]

## 2021-02-14 NOTE — Anesthesia Postprocedure Evaluation (Signed)
Anesthesia Post Note  Patient: Brenda Taylor  Procedure(s) Performed: LOOP ELECTROSURGICAL EXCISION PROCEDURE (LEEP)  Patient location during evaluation: Phase II Anesthesia Type: General Level of consciousness: awake Pain management: pain level controlled Vital Signs Assessment: post-procedure vital signs reviewed and stable Respiratory status: spontaneous breathing and respiratory function stable Cardiovascular status: blood pressure returned to baseline and stable Postop Assessment: no headache and no apparent nausea or vomiting Anesthetic complications: no Comments: Late entry   No notable events documented.   Last Vitals:  Vitals:   02/14/21 1130 02/14/21 1139  BP: 106/65 108/75  Pulse: 87 74  Resp: 13 16  Temp:  36.4 C  SpO2: 99% 100%    Last Pain:  Vitals:   02/14/21 1139  TempSrc: Axillary  PainSc: 0-No pain                 Louann Sjogren

## 2021-02-14 NOTE — Transfer of Care (Signed)
Immediate Anesthesia Transfer of Care Note  Patient: Brenda Taylor  Procedure(s) Performed: LOOP ELECTROSURGICAL EXCISION PROCEDURE (LEEP)  Patient Location: PACU  Anesthesia Type:General  Level of Consciousness: awake, alert , oriented and patient cooperative  Airway & Oxygen Therapy: Patient Spontanous Breathing and Patient connected to face mask oxygen  Post-op Assessment: Report given to RN  Post vital signs: Reviewed  Last Vitals:  Vitals Value Taken Time  BP 82/53 02/14/21 1049  Temp    Pulse 99 02/14/21 1051  Resp 21 02/14/21 1051  SpO2 99 % 02/14/21 1051  Vitals shown include unvalidated device data.  Last Pain:  Vitals:   02/14/21 0815  PainSc: 0-No pain         Complications: No notable events documented.

## 2021-02-16 ENCOUNTER — Ambulatory Visit: Payer: 59 | Admitting: Obstetrics & Gynecology

## 2021-02-16 ENCOUNTER — Encounter (HOSPITAL_COMMUNITY): Payer: Self-pay | Admitting: Obstetrics & Gynecology

## 2021-02-17 LAB — SURGICAL PATHOLOGY

## 2021-02-27 ENCOUNTER — Other Ambulatory Visit: Payer: Self-pay | Admitting: Internal Medicine

## 2021-02-27 ENCOUNTER — Telehealth: Payer: Self-pay | Admitting: Internal Medicine

## 2021-02-27 DIAGNOSIS — F419 Anxiety disorder, unspecified: Secondary | ICD-10-CM

## 2021-02-27 MED ORDER — ALPRAZOLAM 0.25 MG PO TABS: 0.2500 mg | ORAL_TABLET | Freq: Every evening | ORAL | 2 refills | Status: DC | PRN

## 2021-02-27 NOTE — Addendum Note (Signed)
Addended byIhor Dow on: 02/27/2021 05:11 PM   Modules accepted: Orders

## 2021-02-27 NOTE — Telephone Encounter (Signed)
Please call [facility]  pt is having a problem getting her xanax

## 2021-03-07 ENCOUNTER — Telehealth: Payer: Self-pay | Admitting: Obstetrics & Gynecology

## 2021-03-07 NOTE — Telephone Encounter (Signed)
Returned pt's call after Dr Nelda Marseille responded to earlier [facility]. Pt confirmed understanding.

## 2021-03-07 NOTE — Telephone Encounter (Signed)
Patient is calling asking when she can go back to her normal routines asking for a call back

## 2021-03-09 ENCOUNTER — Encounter: Payer: 59 | Admitting: Obstetrics & Gynecology

## 2021-03-14 ENCOUNTER — Other Ambulatory Visit: Payer: Self-pay | Admitting: Internal Medicine

## 2021-03-14 DIAGNOSIS — I10 Essential (primary) hypertension: Secondary | ICD-10-CM

## 2021-03-23 ENCOUNTER — Encounter: Payer: 59 | Admitting: Obstetrics & Gynecology

## 2021-04-10 ENCOUNTER — Encounter: Payer: Self-pay | Admitting: Gastroenterology

## 2021-04-10 ENCOUNTER — Other Ambulatory Visit: Payer: Self-pay

## 2021-04-10 ENCOUNTER — Ambulatory Visit (INDEPENDENT_AMBULATORY_CARE_PROVIDER_SITE_OTHER): Payer: 59 | Admitting: Gastroenterology

## 2021-04-10 ENCOUNTER — Telehealth: Payer: Self-pay

## 2021-04-10 DIAGNOSIS — Z1211 Encounter for screening for malignant neoplasm of colon: Secondary | ICD-10-CM

## 2021-04-10 DIAGNOSIS — R6881 Early satiety: Secondary | ICD-10-CM

## 2021-04-10 DIAGNOSIS — R1011 Right upper quadrant pain: Secondary | ICD-10-CM | POA: Diagnosis not present

## 2021-04-10 MED ORDER — PANTOPRAZOLE SODIUM 40 MG PO TBEC: 40.0000 mg | DELAYED_RELEASE_TABLET | Freq: Every day | ORAL | 5 refills | Status: DC

## 2021-04-10 NOTE — H&P (View-Only) (Signed)
Primary Care Physician:  Lindell Spar, MD  Primary Gastroenterologist:  Garfield Cornea, MD   Chief Complaint  Patient presents with   Colonoscopy    Hx polyps   Abdominal Pain    Right upper abd. Gets cramp if leans over    HPI:  Brenda Taylor is a 50 y.o. female here at the request of Dr. Posey Pronto to schedule screening colonoscopy.  Last colonoscopy was in March 2011.  She had friable anal canal, left-sided diverticula, multiple hyperplastic polyps removed.  Poor prep compromised exam, advised to come back in 5 years.  She also had an EGD at that time showing prepyloric antral ulcer, erosive reflux esophagitis.  Gastric biopsies positive for H. pylori.  Subsequent EGD in August 2011 with four-quadrant distal esophageal erosions, patent tubular esophagus, small hiatal hernia, antral erosions and ulcerations.  Negative for H. pylori.  Overdue for follow-up colonoscopy.  Bowel movements every day.  Only stools are soft.  No melena or rectal bleeding.  Complains of upper GI symptoms. Eats a little bit then feels full. Gaining weight and not sure why.  States she barely eats.  Drinks predominantly water.  Last week hamburger became stuck in the lower esophagus.  Was able to wash down with a couple of swallows.  Denies typical heartburn.  No vomiting.  Complains of right upper quadrant discomfort.  Gets a cramp in ruq if leans over. Rubbing down helps.   Continues BC powders 2-3 times per week for joint pain.   Current Outpatient Medications  Medication Sig Dispense Refill   ALPRAZolam (XANAX) 0.25 MG tablet Take 1 tablet (0.25 mg total) by mouth at bedtime as needed for anxiety. 30 tablet 2   Aspirin-Salicylamide-Caffeine (BC HEADACHE POWDER PO) Take 1 Package by mouth daily as needed (headache/pain).     benzonatate (TESSALON) 100 MG capsule TAKE 1 CAPSULE BY MOUTH 2 TIMES DAILY AS NEEDED FOR COUGH. 30 capsule 0   busPIRone (BUSPAR) 15 MG tablet TAKE 1 TABLET BY MOUTH 3 TIMES DAILY. (Patient  taking differently: Take 15 mg by mouth in the morning.) 90 tablet 2   gabapentin (NEURONTIN) 100 MG capsule TAKE 1 CAPSULE BY MOUTH 2 TIMES DAILY. 60 capsule 0   hydrochlorothiazide (HYDRODIURIL) 25 MG tablet Take 1 tablet (25 mg total) by mouth in the morning. 90 tablet 1   lisinopril (ZESTRIL) 40 MG tablet TAKE ONE TABLET BY MOUTH ONCE DAILY. (Patient taking differently: Take 40 mg by mouth daily.) 90 tablet 0   Vitamin D, Ergocalciferol, (DRISDOL) 1.25 MG (50000 UNIT) CAPS capsule Take 50,000 Units by mouth every Tuesday.     No current facility-administered medications for this visit.    Allergies as of 04/10/2021 - Review Complete 04/10/2021  Allergen Reaction Noted   Amoxicillin     Aspirin  08/20/2012    Past Medical History:  Diagnosis Date   Anxiety    Chronic pain    DDD (degenerative disc disease)    HBP (high blood pressure)    Hernia    Multiple gastric ulcers    Rotator cuff syndrome of left shoulder 10/09/2011   Ulcer disease     Past Surgical History:  Procedure Laterality Date   APPENDECTOMY     BACK SURGERY     x 4   CARPAL TUNNEL RELEASE     bilateral   CHOLECYSTECTOMY     COLONOSCOPY  10/20/2009   [id number] anal canal/left-side diverticula and multiple polyps (hyperplastic). poor prep compromised exam. Next TCS 09/2014.  ESOPHAGOGASTRODUODENOSCOPY  03/06/2010   [id number] quadrant distal esophageal/patent tubular esophagus/small HH, antral erosions and ulcerations. Bx negative for persistent H.pylori   ESOPHAGOGASTRODUODENOSCOPY  10/20/2009   [id number] HH/prepyloric antral ulcer s/p bx (+h.pylori), ERE   INTRAUTERINE DEVICE INSERTION     LEEP N/A 02/14/2021   Procedure: LOOP ELECTROSURGICAL EXCISION PROCEDURE (LEEP);  Surgeon: Janyth Pupa, DO;  Location: [facility];  Service: Gynecology;  Laterality: N/A;   MASTECTOMY, PARTIAL     x 2   MOUTH SURGERY     ROTATOR CUFF REPAIR     right   TUBAL LIGATION      Family History  Problem Relation Age of  Onset   Heart disease Other    Arthritis Other    Cancer Other    Asthma Other    Diabetes Other    Heart disease Mother    Diabetes Mother    Cancer Mother    Non-Hodgkin's lymphoma Mother    Hypertension Mother    Arthritis Mother    Lung cancer Father    Neuropathy Father    Hypertension Father     Social History   Socioeconomic History   Marital status: Widowed    Spouse name: Not on file   Number of children: Not on file   Years of education: 9   Highest education level: Not on file  Occupational History   Occupation: Scientist, water quality  Tobacco Use   Smoking status: Every Day    Packs/day: 0.75    Types: Cigarettes   Smokeless tobacco: Never  Vaping Use   Vaping Use: Never used  Substance and Sexual Activity   Alcohol use: No   Drug use: Yes    Frequency: 7.0 times per week    Types: Marijuana   Sexual activity: Yes    Birth control/protection: I.U.D.  Other Topics Concern   Not on file  Social History Narrative   Not on file   Social Determinants of Health   Financial Resource Strain: Medium Risk   Difficulty of Paying Living Expenses: Somewhat hard  Food Insecurity: No Food Insecurity   Worried About Running Out of Food in the Last Year: Never true   Ran Out of Food in the Last Year: Never true  Transportation Needs: No Transportation Needs   Lack of Transportation (Medical): No   Lack of Transportation (Non-Medical): No  Physical Activity: Inactive   Days of Exercise per Week: 0 days   Minutes of Exercise per Session: 0 min  Stress: Stress Concern Present   Feeling of Stress : To some extent  Social Connections: Socially Isolated   Frequency of Communication with Friends and Family: Three times a week   Frequency of Social Gatherings with Friends and Family: Once a week   Attends Religious Services: Never   Marine scientist or Organizations: Not on file   Attends Archivist Meetings: Never   Marital Status: Widowed  Human resources officer  Violence: Not At Risk   Fear of Current or Ex-Partner: No   Emotionally Abused: No   Physically Abused: No   Sexually Abused: No      ROS:  General: Negative for anorexia, weight loss, fever, chills, fatigue, weakness. Eyes: Negative for vision changes.  ENT: Negative for hoarseness,  nasal congestion. See hpi CV: Negative for chest pain, angina, palpitations, dyspnea on exertion, peripheral edema.  Respiratory: Negative for dyspnea at rest, dyspnea on exertion,   sputum, wheezing. Chronic cough GI: See history of present illness. GU:  Negative for dysuria, hematuria, urinary incontinence, urinary frequency, nocturnal urination.  MS: positive for joint pain, low back pain.  Derm: Negative for rash or itching.  Neuro: Negative for weakness, abnormal sensation, seizure, frequent headaches, memory loss, confusion.  Psych: Negative for anxiety, depression, suicidal ideation, hallucinations.  Endo: Negative for unusual weight change.  Heme: Negative for bruising or bleeding. Allergy: Negative for rash or hives.    Physical Examination:  BP 109/72   Pulse 94   Temp (!) 97.5 F (36.4 C) (Temporal)   Ht '5\' 6"'$  (1.676 m)   Wt 268 lb 6.4 oz (121.7 kg)   BMI 43.32 kg/m    General: Well-nourished, well-developed in no acute distress.  Head: Normocephalic, atraumatic.   Eyes: Conjunctiva pink, no icterus. Mouth: masked Neck: Supple without thyromegaly, masses, or lymphadenopathy.  Lungs: Clear to auscultation bilaterally.  Heart: Regular rate and rhythm, no murmurs rubs or gallops.  Abdomen: Bowel sounds are normal,   nondistended, no hepatosplenomegaly or masses, no abdominal bruits or    hernia , no rebound or guarding.  Mild epigastric/ruq tenderness Rectal: not performed Extremities: No lower extremity edema. No clubbing or deformities.  Neuro: Alert and oriented x 4 , grossly normal neurologically.  Skin: Warm and dry, no rash or jaundice.   Psych: Alert and cooperative,  normal mood and affect.  Labs: Lab Results  Component Value Date   WBC 11.2 (H) 02/10/2021   HGB 12.2 02/10/2021   HCT 37.2 02/10/2021   MCV 95.6 02/10/2021   PLT 308 02/10/2021   Lab Results  Component Value Date   CREATININE 1.06 (H) 02/10/2021   BUN 26 (H) 02/10/2021   NA 137 02/10/2021   K 4.2 02/10/2021   CL 104 02/10/2021   CO2 26 02/10/2021   Lab Results  Component Value Date   ALT 18 09/14/2020   AST 20 09/14/2020   ALKPHOS 95 09/14/2020   BILITOT <0.2 09/14/2020   Lab Results  Component Value Date   HGBA1C 5.7 (H) 09/14/2020   Lab Results  Component Value Date   TSH 1.730 09/14/2020     Imaging Studies: No results found.   Assessment:  Pleasant 50 year old female presenting to schedule screening colonoscopy.  Prior colonoscopy in 2011 with poor prep, advised to come back in 5 years at that time.  She is ready to pursue colonoscopy at this time.  Denies any lower GI symptoms.  She had suboptimal prep at that time.  She complains of early satiety, right upper quadrant discomfort, single episode of solid food dysphagia.  She has a history of H. pylori treated in 2011.  History of gastric ulcers noted on both endoscopies that year, persistent ulcers felt to be due to NSAID use at that time.  Patient presents off PPI.  Denies typical heartburn.  Uses aspirin powders 2-3 times per week for joint pain.  Would be concerned about possibility of recurrent ulcer disease.  Cannot exclude untreated reflux esophagitis which has been well-documented in the past as well.  She could have esophageal stricture but given isolated solid food dysphagia event, stricture less likely.  Plan: H. pylori stool antigen.   Start pantoprazole 40 mg daily before breakfast after stool specimen collected for H. pylori.   Colonoscopy/EGD plus or minus esophageal dilation with propofol (given polypharmacy).   ASA III.  I have discussed the risks, alternatives, benefits with regards to but not  limited to the risk of reaction to medication, bleeding, infection, perforation and the patient is  agreeable to proceed. Written consent to be obtained.  Patient okay with Dr. Abbey Chatters completing procedure if Dr. Gala Romney schedule does not allow in the near future.

## 2021-04-10 NOTE — Telephone Encounter (Signed)
Low volume prep not on pt's formulary.  Called pt, she will come by office to pick up Clenpiq sample and instructions.

## 2021-04-10 NOTE — Telephone Encounter (Signed)
Pre-op appt 04/25/21.  Pt came by office to pick up Clenpiq sample. Sample, pre-op appt, and instructions given. Work note for appt today also given per pt's request.

## 2021-04-10 NOTE — Telephone Encounter (Signed)
Called pt, TCS/EGD/-/+DIL w/Propofol ASA 3 scheduled with Dr. Abbey Chatters (Dr. Gala Romney doesn't currently have available case until November, pt ok with Dr. Abbey Chatters doing procedure). Requested small volume prep. Orders entered.

## 2021-04-10 NOTE — Progress Notes (Signed)
Primary Care Physician:  Lindell Spar, MD  Primary Gastroenterologist:  Garfield Cornea, MD   Chief Complaint  Patient presents with   Colonoscopy    Hx polyps   Abdominal Pain    Right upper abd. Gets cramp if leans over    HPI:  Brenda Taylor is a 50 y.o. female here at the request of Dr. Posey Pronto to schedule screening colonoscopy.  Last colonoscopy was in March 2011.  She had friable anal canal, left-sided diverticula, multiple hyperplastic polyps removed.  Poor prep compromised exam, advised to come back in 5 years.  She also had an EGD at that time showing prepyloric antral ulcer, erosive reflux esophagitis.  Gastric biopsies positive for H. pylori.  Subsequent EGD in August 2011 with four-quadrant distal esophageal erosions, patent tubular esophagus, small hiatal hernia, antral erosions and ulcerations.  Negative for H. pylori.  Overdue for follow-up colonoscopy.  Bowel movements every day.  Only stools are soft.  No melena or rectal bleeding.  Complains of upper GI symptoms. Eats a little bit then feels full. Gaining weight and not sure why.  States she barely eats.  Drinks predominantly water.  Last week hamburger became stuck in the lower esophagus.  Was able to wash down with a couple of swallows.  Denies typical heartburn.  No vomiting.  Complains of right upper quadrant discomfort.  Gets a cramp in ruq if leans over. Rubbing down helps.   Continues BC powders 2-3 times per week for joint pain.   Current Outpatient Medications  Medication Sig Dispense Refill   ALPRAZolam (XANAX) 0.25 MG tablet Take 1 tablet (0.25 mg total) by mouth at bedtime as needed for anxiety. 30 tablet 2   Aspirin-Salicylamide-Caffeine (BC HEADACHE POWDER PO) Take 1 Package by mouth daily as needed (headache/pain).     benzonatate (TESSALON) 100 MG capsule TAKE 1 CAPSULE BY MOUTH 2 TIMES DAILY AS NEEDED FOR COUGH. 30 capsule 0   busPIRone (BUSPAR) 15 MG tablet TAKE 1 TABLET BY MOUTH 3 TIMES DAILY. (Patient  taking differently: Take 15 mg by mouth in the morning.) 90 tablet 2   gabapentin (NEURONTIN) 100 MG capsule TAKE 1 CAPSULE BY MOUTH 2 TIMES DAILY. 60 capsule 0   hydrochlorothiazide (HYDRODIURIL) 25 MG tablet Take 1 tablet (25 mg total) by mouth in the morning. 90 tablet 1   lisinopril (ZESTRIL) 40 MG tablet TAKE ONE TABLET BY MOUTH ONCE DAILY. (Patient taking differently: Take 40 mg by mouth daily.) 90 tablet 0   Vitamin D, Ergocalciferol, (DRISDOL) 1.25 MG (50000 UNIT) CAPS capsule Take 50,000 Units by mouth every Tuesday.     No current facility-administered medications for this visit.    Allergies as of 04/10/2021 - Review Complete 04/10/2021  Allergen Reaction Noted   Amoxicillin     Aspirin  08/20/2012    Past Medical History:  Diagnosis Date   Anxiety    Chronic pain    DDD (degenerative disc disease)    HBP (high blood pressure)    Hernia    Multiple gastric ulcers    Rotator cuff syndrome of left shoulder 10/09/2011   Ulcer disease     Past Surgical History:  Procedure Laterality Date   APPENDECTOMY     BACK SURGERY     x 4   CARPAL TUNNEL RELEASE     bilateral   CHOLECYSTECTOMY     COLONOSCOPY  10/20/2009   [id number] anal canal/left-side diverticula and multiple polyps (hyperplastic). poor prep compromised exam. Next TCS 09/2014.  ESOPHAGOGASTRODUODENOSCOPY  03/06/2010   [id number] quadrant distal esophageal/patent tubular esophagus/small HH, antral erosions and ulcerations. Bx negative for persistent H.pylori   ESOPHAGOGASTRODUODENOSCOPY  10/20/2009   [id number] HH/prepyloric antral ulcer s/p bx (+h.pylori), ERE   INTRAUTERINE DEVICE INSERTION     LEEP N/A 02/14/2021   Procedure: LOOP ELECTROSURGICAL EXCISION PROCEDURE (LEEP);  Surgeon: Janyth Pupa, DO;  Location: [facility];  Service: Gynecology;  Laterality: N/A;   MASTECTOMY, PARTIAL     x 2   MOUTH SURGERY     ROTATOR CUFF REPAIR     right   TUBAL LIGATION      Family History  Problem Relation Age of  Onset   Heart disease Other    Arthritis Other    Cancer Other    Asthma Other    Diabetes Other    Heart disease Mother    Diabetes Mother    Cancer Mother    Non-Hodgkin's lymphoma Mother    Hypertension Mother    Arthritis Mother    Lung cancer Father    Neuropathy Father    Hypertension Father     Social History   Socioeconomic History   Marital status: Widowed    Spouse name: Not on file   Number of children: Not on file   Years of education: 9   Highest education level: Not on file  Occupational History   Occupation: Scientist, water quality  Tobacco Use   Smoking status: Every Day    Packs/day: 0.75    Types: Cigarettes   Smokeless tobacco: Never  Vaping Use   Vaping Use: Never used  Substance and Sexual Activity   Alcohol use: No   Drug use: Yes    Frequency: 7.0 times per week    Types: Marijuana   Sexual activity: Yes    Birth control/protection: I.U.D.  Other Topics Concern   Not on file  Social History Narrative   Not on file   Social Determinants of Health   Financial Resource Strain: Medium Risk   Difficulty of Paying Living Expenses: Somewhat hard  Food Insecurity: No Food Insecurity   Worried About Running Out of Food in the Last Year: Never true   Ran Out of Food in the Last Year: Never true  Transportation Needs: No Transportation Needs   Lack of Transportation (Medical): No   Lack of Transportation (Non-Medical): No  Physical Activity: Inactive   Days of Exercise per Week: 0 days   Minutes of Exercise per Session: 0 min  Stress: Stress Concern Present   Feeling of Stress : To some extent  Social Connections: Socially Isolated   Frequency of Communication with Friends and Family: Three times a week   Frequency of Social Gatherings with Friends and Family: Once a week   Attends Religious Services: Never   Marine scientist or Organizations: Not on file   Attends Archivist Meetings: Never   Marital Status: Widowed  Human resources officer  Violence: Not At Risk   Fear of Current or Ex-Partner: No   Emotionally Abused: No   Physically Abused: No   Sexually Abused: No      ROS:  General: Negative for anorexia, weight loss, fever, chills, fatigue, weakness. Eyes: Negative for vision changes.  ENT: Negative for hoarseness,  nasal congestion. See hpi CV: Negative for chest pain, angina, palpitations, dyspnea on exertion, peripheral edema.  Respiratory: Negative for dyspnea at rest, dyspnea on exertion,   sputum, wheezing. Chronic cough GI: See history of present illness. GU:  Negative for dysuria, hematuria, urinary incontinence, urinary frequency, nocturnal urination.  MS: positive for joint pain, low back pain.  Derm: Negative for rash or itching.  Neuro: Negative for weakness, abnormal sensation, seizure, frequent headaches, memory loss, confusion.  Psych: Negative for anxiety, depression, suicidal ideation, hallucinations.  Endo: Negative for unusual weight change.  Heme: Negative for bruising or bleeding. Allergy: Negative for rash or hives.    Physical Examination:  BP 109/72   Pulse 94   Temp (!) 97.5 F (36.4 C) (Temporal)   Ht '5\' 6"'$  (1.676 m)   Wt 268 lb 6.4 oz (121.7 kg)   BMI 43.32 kg/m    General: Well-nourished, well-developed in no acute distress.  Head: Normocephalic, atraumatic.   Eyes: Conjunctiva pink, no icterus. Mouth: masked Neck: Supple without thyromegaly, masses, or lymphadenopathy.  Lungs: Clear to auscultation bilaterally.  Heart: Regular rate and rhythm, no murmurs rubs or gallops.  Abdomen: Bowel sounds are normal,   nondistended, no hepatosplenomegaly or masses, no abdominal bruits or    hernia , no rebound or guarding.  Mild epigastric/ruq tenderness Rectal: not performed Extremities: No lower extremity edema. No clubbing or deformities.  Neuro: Alert and oriented x 4 , grossly normal neurologically.  Skin: Warm and dry, no rash or jaundice.   Psych: Alert and cooperative,  normal mood and affect.  Labs: Lab Results  Component Value Date   WBC 11.2 (H) 02/10/2021   HGB 12.2 02/10/2021   HCT 37.2 02/10/2021   MCV 95.6 02/10/2021   PLT 308 02/10/2021   Lab Results  Component Value Date   CREATININE 1.06 (H) 02/10/2021   BUN 26 (H) 02/10/2021   NA 137 02/10/2021   K 4.2 02/10/2021   CL 104 02/10/2021   CO2 26 02/10/2021   Lab Results  Component Value Date   ALT 18 09/14/2020   AST 20 09/14/2020   ALKPHOS 95 09/14/2020   BILITOT <0.2 09/14/2020   Lab Results  Component Value Date   HGBA1C 5.7 (H) 09/14/2020   Lab Results  Component Value Date   TSH 1.730 09/14/2020     Imaging Studies: No results found.   Assessment:  Pleasant 50 year old female presenting to schedule screening colonoscopy.  Prior colonoscopy in 2011 with poor prep, advised to come back in 5 years at that time.  She is ready to pursue colonoscopy at this time.  Denies any lower GI symptoms.  She had suboptimal prep at that time.  She complains of early satiety, right upper quadrant discomfort, single episode of solid food dysphagia.  She has a history of H. pylori treated in 2011.  History of gastric ulcers noted on both endoscopies that year, persistent ulcers felt to be due to NSAID use at that time.  Patient presents off PPI.  Denies typical heartburn.  Uses aspirin powders 2-3 times per week for joint pain.  Would be concerned about possibility of recurrent ulcer disease.  Cannot exclude untreated reflux esophagitis which has been well-documented in the past as well.  She could have esophageal stricture but given isolated solid food dysphagia event, stricture less likely.  Plan: H. pylori stool antigen.   Start pantoprazole 40 mg daily before breakfast after stool specimen collected for H. pylori.   Colonoscopy/EGD plus or minus esophageal dilation with propofol (given polypharmacy).   ASA III.  I have discussed the risks, alternatives, benefits with regards to but not  limited to the risk of reaction to medication, bleeding, infection, perforation and the patient is  agreeable to proceed. Written consent to be obtained.  Patient okay with Dr. Abbey Chatters completing procedure if Dr. Gala Romney schedule does not allow in the near future.

## 2021-04-10 NOTE — Patient Instructions (Addendum)
Collect stool for H. pylori repeat prior to starting pantprazole. After collecting stool, you can start pantoprazole 40 mg daily before breakfast. Colonoscopy and upper endoscopy as scheduled.  Please see separate instructions. You will use samples provided to get bowels moving prior to starting prep for colonoscopy. Take one daily starting 5 days before your procedure. If you have greater than 4 stools in a given day, you can skip Linzess the following day if needed. Follow prep instructions as provided.

## 2021-04-11 ENCOUNTER — Other Ambulatory Visit: Payer: Self-pay | Admitting: Gastroenterology

## 2021-04-12 LAB — HELICOBACTER PYLORI  SPECIAL ANTIGEN
MICRO NUMBER:: 12400274
SPECIMEN QUALITY: ADEQUATE

## 2021-04-14 ENCOUNTER — Other Ambulatory Visit: Payer: Self-pay | Admitting: Internal Medicine

## 2021-04-14 DIAGNOSIS — F419 Anxiety disorder, unspecified: Secondary | ICD-10-CM

## 2021-04-24 NOTE — Patient Instructions (Addendum)
Brenda Taylor  04/24/2021     @PREFPERIOPPHARMACY @   Your procedure is scheduled on  04/28/2021.   Report to [facility] at  0730 A.M.  Call this number if you have problems the morning of surgery:  [phone]   Remember:  Follow the diet and prep instructions given to you by the office.    Take these medicines the morning of surgery with A SIP OF WATER                  buspar, gabapentin, protonix.     Do not wear jewelry, make-up or nail polish.  Do not wear lotions, powders, or perfumes, or deodorant.  Do not shave 48 hours prior to surgery.  Men may shave face and neck.  Do not bring valuables to the hospital.  [facility] is not responsible for any belongings or valuables.  Contacts, dentures or bridgework may not be worn into surgery.  Leave your suitcase in the car.  After surgery it may be brought to your room.  For patients admitted to the hospital, discharge time will be determined by your treatment team.  Patients discharged the day of surgery will not be allowed to drive home and must have someone with them for 24 hours.    Special instructions:   DO NOT smoke tobacco or vape for 24 hors before your procedure.  Please read over the following fact sheets that you were given. Anesthesia Post-op Instructions and Care and Recovery After Surgery      Upper Endoscopy, Adult, Care After This sheet gives you information about how to care for yourself after your procedure. Your health care provider may also give you more specific instructions. If you have problems or questions, contact your health care provider. What can I expect after the procedure? After the procedure, it is common to have: A sore throat. Mild stomach pain or discomfort. Bloating. Nausea. Follow these instructions at home:  Follow instructions from your health care provider about what to eat or drink after your procedure. Return to your normal activities as told by your  health care provider. Ask your health care provider what activities are safe for you. Take over-the-counter and prescription medicines only as told by your health care provider. If you were given a sedative during the procedure, it can affect you for several hours. Do not drive or operate machinery until your health care provider says that it is safe. Keep all follow-up visits as told by your health care provider. This is important. Contact a health care provider if you have: A sore throat that lasts longer than one day. Trouble swallowing. Get help right away if: You vomit blood or your vomit looks like coffee grounds. You have: A fever. Bloody, black, or tarry stools. A severe sore throat or you cannot swallow. Difficulty breathing. Severe pain in your chest or abdomen. Summary After the procedure, it is common to have a sore throat, mild stomach discomfort, bloating, and nausea. If you were given a sedative during the procedure, it can affect you for several hours. Do not drive or operate machinery until your health care provider says that it is safe. Follow instructions from your health care provider about what to eat or drink after your procedure. Return to your normal activities as told by your health care provider. This information is not intended to replace advice given to you by your health care provider. Make  sure you discuss any questions you have with your health care provider. Document Revised: 07/07/2019 Document Reviewed: 12/09/2017 Elsevier Patient Education  2022 [place]. Esophageal Dilatation Esophageal dilatation, also called esophageal dilation, is a procedure to widen or open a blocked or narrowed part of the esophagus. The esophagus is the part of the body that moves food and liquid from the mouth to the stomach. You may need this procedure if: You have a buildup of scar tissue in your esophagus that makes it difficult, painful, or impossible to swallow. This can  be caused by gastroesophageal reflux disease (GERD). You have cancer of the esophagus. There is a problem with how food moves through your esophagus. In some cases, you may need this procedure repeated at a later time to dilate the esophagus gradually. Tell a health care provider about: Any allergies you have. All medicines you are taking, including vitamins, herbs, eye drops, creams, and over-the-counter medicines. Any problems you or family members have had with anesthetic medicines. Any blood disorders you have. Any surgeries you have had. Any medical conditions you have. Any antibiotic medicines you are required to take before dental procedures. Whether you are pregnant or may be pregnant. What are the risks? Generally, this is a safe procedure. However, problems may occur, including: Bleeding due to a tear in the lining of the esophagus. A hole, or perforation, in the esophagus. What happens before the procedure? Ask your health care provider about: Changing or stopping your regular medicines. This is especially important if you are taking diabetes medicines or blood thinners. Taking medicines such as aspirin and ibuprofen. These medicines can thin your blood. Do not take these medicines unless your health care provider tells you to take them. Taking over-the-counter medicines, vitamins, herbs, and supplements. Follow instructions from your health care provider about eating or drinking restrictions. Plan to have a responsible adult take you home from the hospital or clinic. Plan to have a responsible adult care for you for the time you are told after you leave the hospital or clinic. This is important. What happens during the procedure? You may be given a medicine to help you relax (sedative). A numbing medicine may be sprayed into the back of your throat, or you may gargle the medicine. Your health care provider may perform the dilatation using various surgical instruments, such  as: Simple dilators. This instrument is carefully placed in the esophagus to stretch it. Guided wire bougies. This involves using an endoscope to insert a wire into the esophagus. A dilator is passed over this wire to enlarge the esophagus. Then the wire is removed. Balloon dilators. An endoscope with a small balloon is inserted into the esophagus. The balloon is inflated to stretch the esophagus and open it up. The procedure may vary among health care providers and hospitals. What can I expect after the procedure? Your blood pressure, heart rate, breathing rate, and blood oxygen level will be monitored until you leave the hospital or clinic. Your throat may feel slightly sore and numb. This will get better over time. You will not be allowed to eat or drink until your throat is no longer numb. When you are able to drink, urinate, and sit on the edge of the bed without nausea or dizziness, you may be able to return home. Follow these instructions at home: Take over-the-counter and prescription medicines only as told by your health care provider. If you were given a sedative during the procedure, it can affect you for several  hours. Do not drive or operate machinery until your health care provider says that it is safe. Plan to have a responsible adult care for you for the time you are told. This is important. Follow instructions from your health care provider about any eating or drinking restrictions. Do not use any products that contain nicotine or tobacco, such as cigarettes, e-cigarettes, and chewing tobacco. If you need help quitting, ask your health care provider. Keep all follow-up visits. This is important. Contact a health care provider if: You have a fever. You have pain that is not relieved by medicine. Get help right away if: You have chest pain. You have trouble breathing. You have trouble swallowing. You vomit blood. You have black, tarry, or bloody stools. These symptoms may  represent a serious problem that is an emergency. Do not wait to see if the symptoms will go away. Get medical help right away. Call your local emergency services (911 in the [place] Do not drive yourself to the hospital. Summary Esophageal dilatation, also called esophageal dilation, is a procedure to widen or open a blocked or narrowed part of the esophagus. Plan to have a responsible adult take you home from the hospital or clinic. For this procedure, a numbing medicine may be sprayed into the back of your throat, or you may gargle the medicine. Do not drive or operate machinery until your health care provider says that it is safe. This information is not intended to replace advice given to you by your health care provider. Make sure you discuss any questions you have with your health care provider. Document Revised: 11/25/2019 Document Reviewed: 11/25/2019 Elsevier Patient Education  [place]. Colonoscopy, Adult, Care After This sheet gives you information about how to care for yourself after your procedure. Your health care provider may also give you more specific instructions. If you have problems or questions, contact your health care provider. What can I expect after the procedure? After the procedure, it is common to have: A small amount of blood in your stool for 24 hours after the procedure. Some gas. Mild cramping or bloating of your abdomen. Follow these instructions at home: Eating and drinking  Drink enough fluid to keep your urine pale yellow. Follow instructions from your health care provider about eating or drinking restrictions. Resume your normal diet as instructed by your health care provider. Avoid heavy or fried foods that are hard to digest. Activity Rest as told by your health care provider. Avoid sitting for a long time without moving. Get up to take short walks every 1-2 hours. This is important to improve blood flow and breathing. Ask for help if you feel  weak or unsteady. Return to your normal activities as told by your health care provider. Ask your health care provider what activities are safe for you. Managing cramping and bloating  Try walking around when you have cramps or feel bloated. Apply heat to your abdomen as told by your health care provider. Use the heat source that your health care provider recommends, such as a moist heat pack or a heating pad. Place a towel between your skin and the heat source. Leave the heat on for 20-30 minutes. Remove the heat if your skin turns bright red. This is especially important if you are unable to feel pain, heat, or cold. You may have a greater risk of getting burned. General instructions If you were given a sedative during the procedure, it can affect you for several hours. Do not  drive or operate machinery until your health care provider says that it is safe. For the first 24 hours after the procedure: Do not sign important documents. Do not drink alcohol. Do your regular daily activities at a slower pace than normal. Eat soft foods that are easy to digest. Take over-the-counter and prescription medicines only as told by your health care provider. Keep all follow-up visits as told by your health care provider. This is important. Contact a health care provider if: You have blood in your stool 2-3 days after the procedure. Get help right away if you have: More than a small spotting of blood in your stool. Large blood clots in your stool. Swelling of your abdomen. Nausea or vomiting. A fever. Increasing pain in your abdomen that is not relieved with medicine. Summary After the procedure, it is common to have a small amount of blood in your stool. You may also have mild cramping and bloating of your abdomen. If you were given a sedative during the procedure, it can affect you for several hours. Do not drive or operate machinery until your health care provider says that it is safe. Get help  right away if you have a lot of blood in your stool, nausea or vomiting, a fever, or increased pain in your abdomen. This information is not intended to replace advice given to you by your health care provider. Make sure you discuss any questions you have with your health care provider. Document Revised: 07/03/2019 Document Reviewed: 02/02/2019 Elsevier Patient Education  [place] After This sheet gives you information about how to care for yourself after your procedure. Your health care provider may also give you more specific instructions. If you have problems or questions, contact your health care provider. What can I expect after the procedure? After the procedure, it is common to have: Tiredness. Forgetfulness about what happened after the procedure. Impaired judgment for important decisions. Nausea or vomiting. Some difficulty with balance. Follow these instructions at home: For the time period you were told by your health care provider:   Rest as needed. Do not participate in activities where you could fall or become injured. Do not drive or use machinery. Do not drink alcohol. Do not take sleeping pills or medicines that cause drowsiness. Do not make important decisions or sign legal documents. Do not take care of children on your own. Eating and drinking Follow the diet that is recommended by your health care provider. Drink enough fluid to keep your urine pale yellow. If you vomit: Drink water, juice, or soup when you can drink without vomiting. Make sure you have little or no nausea before eating solid foods. General instructions Have a responsible adult stay with you for the time you are told. It is important to have someone help care for you until you are awake and alert. Take over-the-counter and prescription medicines only as told by your health care provider. If you have sleep apnea, surgery and certain medicines can increase  your risk for breathing problems. Follow instructions from your health care provider about wearing your sleep device: Anytime you are sleeping, including during daytime naps. While taking prescription pain medicines, sleeping medicines, or medicines that make you drowsy. Avoid smoking. Keep all follow-up visits as told by your health care provider. This is important. Contact a health care provider if: You keep feeling nauseous or you keep vomiting. You feel light-headed. You are still sleepy or having trouble with balance after 24  hours. You develop a rash. You have a fever. You have redness or swelling around the IV site. Get help right away if: You have trouble breathing. You have new-onset confusion at home. Summary For several hours after your procedure, you may feel tired. You may also be forgetful and have poor judgment. Have a responsible adult stay with you for the time you are told. It is important to have someone help care for you until you are awake and alert. Rest as told. Do not drive or operate machinery. Do not drink alcohol or take sleeping pills. Get help right away if you have trouble breathing, or if you suddenly become confused. This information is not intended to replace advice given to you by your health care provider. Make sure you discuss any questions you have with your health care provider. Document Revised: 03/24/2020 Document Reviewed: 06/11/2019 Elsevier Patient Education  2022 [employer].

## 2021-04-25 ENCOUNTER — Encounter (HOSPITAL_COMMUNITY)
Admission: RE | Admit: 2021-04-25 | Discharge: 2021-04-25 | Disposition: A | Discharge status: Home / Self Care | Payer: 59 | Source: Ambulatory Visit | Attending: Internal Medicine | Admitting: Internal Medicine

## 2021-04-25 ENCOUNTER — Other Ambulatory Visit: Payer: Self-pay

## 2021-04-25 ENCOUNTER — Encounter (HOSPITAL_COMMUNITY): Payer: Self-pay

## 2021-04-25 ENCOUNTER — Telehealth: Payer: Self-pay | Admitting: Internal Medicine

## 2021-04-25 DIAGNOSIS — Z01812 Encounter for preprocedural laboratory examination: Secondary | ICD-10-CM | POA: Diagnosis not present

## 2021-04-25 HISTORY — DX: Gastro-esophageal reflux disease without esophagitis: K21.9

## 2021-04-25 HISTORY — DX: Headache, unspecified: R51.9

## 2021-04-25 HISTORY — DX: Prediabetes: R73.03

## 2021-04-25 LAB — BASIC METABOLIC PANEL
Anion gap: 7 (ref 5–15)
BUN: 31 mg/dL — ABNORMAL HIGH (ref 6–20)
CO2: 25 mmol/L (ref 22–32)
Calcium: 8.9 mg/dL (ref 8.9–10.3)
Chloride: 105 mmol/L (ref 98–111)
Creatinine, Ser: 1.05 mg/dL — ABNORMAL HIGH (ref 0.44–1.00)
GFR, Estimated: >60 mL/min (ref >60)
Glucose, Bld: 116 mg/dL — ABNORMAL HIGH (ref 70–99)
Potassium: 4 mmol/L (ref 3.5–5.1)
Sodium: 137 mmol/L (ref 135–145)

## 2021-04-25 LAB — HCG, SERUM, QUALITATIVE: Preg, Serum: NEGATIVE

## 2021-04-25 NOTE — Telephone Encounter (Signed)
Pt called stating that she was not given the Linzess samples when she was in office and was asked about them today when she went for preop. Not sure why pt did not receive the samples. Please advise.

## 2021-04-25 NOTE — Telephone Encounter (Signed)
Please give her samples of Linzess 213mcg, one bottle. Take first dose 10/5 and dose 10/6. She will take remaining bowel prep per instructions provided previously.

## 2021-04-25 NOTE — Telephone Encounter (Signed)
Pt called to say she went to her pre op and was asking about her linzess. She said that she hasn't been taking it because it hasn't been called in. She said she was told that she needed to take it so many days prior to her procedure. Please advise and call her at [phone]

## 2021-04-26 NOTE — Telephone Encounter (Signed)
Lmom for pt to return my call.  

## 2021-04-26 NOTE — Telephone Encounter (Signed)
Pt was made aware and will pick Linzess up today as instructed.

## 2021-04-27 ENCOUNTER — Telehealth: Payer: Self-pay

## 2021-04-27 NOTE — Telephone Encounter (Signed)
Called pt, TCS/EGD/-/+DIL for tomorrow rescheduled to 05/04/21 at 8:15am d/t issue with city water. Advised her pre-op nurse wil inform her of arrival time. She has 1 Linzess capsule. Advised her to take Linzess 131mcg once daily starting tomorrow for 5 days. She will come by office to pick up 2 boxes of Linzess 172mcg samples and new instructions. Endo scheduler informed.

## 2021-04-28 ENCOUNTER — Encounter (HOSPITAL_COMMUNITY)
Admission: RE | Admit: 2021-04-28 | Discharge: 2021-04-28 | Disposition: A | Discharge status: Home / Self Care | Payer: 59 | Source: Ambulatory Visit | Attending: Internal Medicine | Admitting: Internal Medicine

## 2021-04-28 ENCOUNTER — Other Ambulatory Visit: Payer: Self-pay

## 2021-05-04 ENCOUNTER — Ambulatory Visit (HOSPITAL_COMMUNITY): Payer: 59 | Admitting: Anesthesiology

## 2021-05-04 ENCOUNTER — Ambulatory Visit (HOSPITAL_COMMUNITY)
Admission: RE | Admit: 2021-05-04 | Discharge: 2021-05-04 | Disposition: A | Discharge status: Home / Self Care | Payer: 59 | Attending: Internal Medicine | Admitting: Internal Medicine

## 2021-05-04 ENCOUNTER — Encounter (HOSPITAL_COMMUNITY)
Admission: RE | Disposition: A | Discharge status: Home / Self Care | Payer: Self-pay | Source: Home / Self Care | Attending: Internal Medicine

## 2021-05-04 ENCOUNTER — Encounter (HOSPITAL_COMMUNITY): Payer: Self-pay

## 2021-05-04 DIAGNOSIS — F17210 Nicotine dependence, cigarettes, uncomplicated: Secondary | ICD-10-CM | POA: Diagnosis not present

## 2021-05-04 DIAGNOSIS — K31A0 Gastric intestinal metaplasia, unspecified: Secondary | ICD-10-CM | POA: Insufficient documentation

## 2021-05-04 DIAGNOSIS — Z79899 Other long term (current) drug therapy: Secondary | ICD-10-CM | POA: Insufficient documentation

## 2021-05-04 DIAGNOSIS — K2970 Gastritis, unspecified, without bleeding: Secondary | ICD-10-CM | POA: Diagnosis not present

## 2021-05-04 DIAGNOSIS — K635 Polyp of colon: Secondary | ICD-10-CM | POA: Diagnosis not present

## 2021-05-04 DIAGNOSIS — D124 Benign neoplasm of descending colon: Secondary | ICD-10-CM | POA: Insufficient documentation

## 2021-05-04 DIAGNOSIS — Z9049 Acquired absence of other specified parts of digestive tract: Secondary | ICD-10-CM | POA: Insufficient documentation

## 2021-05-04 DIAGNOSIS — Z1211 Encounter for screening for malignant neoplasm of colon: Secondary | ICD-10-CM | POA: Insufficient documentation

## 2021-05-04 DIAGNOSIS — K2980 Duodenitis without bleeding: Secondary | ICD-10-CM | POA: Insufficient documentation

## 2021-05-04 DIAGNOSIS — Z880 Allergy status to penicillin: Secondary | ICD-10-CM | POA: Insufficient documentation

## 2021-05-04 DIAGNOSIS — K648 Other hemorrhoids: Secondary | ICD-10-CM | POA: Diagnosis not present

## 2021-05-04 DIAGNOSIS — R1310 Dysphagia, unspecified: Secondary | ICD-10-CM | POA: Diagnosis not present

## 2021-05-04 DIAGNOSIS — K621 Rectal polyp: Secondary | ICD-10-CM

## 2021-05-04 DIAGNOSIS — R1011 Right upper quadrant pain: Secondary | ICD-10-CM | POA: Diagnosis not present

## 2021-05-04 DIAGNOSIS — D123 Benign neoplasm of transverse colon: Secondary | ICD-10-CM | POA: Insufficient documentation

## 2021-05-04 DIAGNOSIS — Z8711 Personal history of peptic ulcer disease: Secondary | ICD-10-CM | POA: Diagnosis not present

## 2021-05-04 DIAGNOSIS — Z8619 Personal history of other infectious and parasitic diseases: Secondary | ICD-10-CM | POA: Insufficient documentation

## 2021-05-04 DIAGNOSIS — Z886 Allergy status to analgesic agent status: Secondary | ICD-10-CM | POA: Insufficient documentation

## 2021-05-04 DIAGNOSIS — K317 Polyp of stomach and duodenum: Secondary | ICD-10-CM | POA: Insufficient documentation

## 2021-05-04 DIAGNOSIS — D128 Benign neoplasm of rectum: Secondary | ICD-10-CM | POA: Insufficient documentation

## 2021-05-04 DIAGNOSIS — Z596 Low income: Secondary | ICD-10-CM | POA: Diagnosis not present

## 2021-05-04 HISTORY — PX: COLONOSCOPY WITH PROPOFOL: SHX5780

## 2021-05-04 HISTORY — PX: POLYPECTOMY: SHX5525

## 2021-05-04 HISTORY — PX: ESOPHAGOGASTRODUODENOSCOPY (EGD) WITH PROPOFOL: SHX5813

## 2021-05-04 HISTORY — PX: BIOPSY: SHX5522

## 2021-05-04 SURGERY — COLONOSCOPY WITH PROPOFOL
Anesthesia: General

## 2021-05-04 MED ORDER — LIDOCAINE HCL (CARDIAC) PF 50 MG/5ML IV SOSY
PREFILLED_SYRINGE | INTRAVENOUS | Status: DC | PRN
  Administered 2021-05-04: 50 mg via INTRAVENOUS

## 2021-05-04 MED ORDER — KETAMINE HCL 10 MG/ML IJ SOLN
INTRAMUSCULAR | Status: DC | PRN
  Administered 2021-05-04: 25 mg via INTRAVENOUS

## 2021-05-04 MED ORDER — PROPOFOL 10 MG/ML IV BOLUS
INTRAVENOUS | Status: DC | PRN
  Administered 2021-05-04: 100 mg via INTRAVENOUS

## 2021-05-04 MED ORDER — KETAMINE HCL 50 MG/5ML IJ SOSY
PREFILLED_SYRINGE | INTRAMUSCULAR | Status: AC
  Filled 2021-05-04: qty 5

## 2021-05-04 MED ORDER — PANTOPRAZOLE SODIUM 40 MG PO TBEC: 40.0000 mg | DELAYED_RELEASE_TABLET | Freq: Two times a day (BID) | ORAL | 11 refills | Status: DC

## 2021-05-04 MED ORDER — STERILE WATER FOR IRRIGATION IR SOLN
Status: DC | PRN
  Administered 2021-05-04: 100 mL

## 2021-05-04 MED ORDER — PROPOFOL 500 MG/50ML IV EMUL
INTRAVENOUS | Status: DC | PRN
  Administered 2021-05-04: 150 ug/kg/min via INTRAVENOUS

## 2021-05-04 MED ORDER — PROPOFOL 10 MG/ML IV BOLUS
INTRAVENOUS | Status: AC
  Filled 2021-05-04: qty 20

## 2021-05-04 MED ORDER — LACTATED RINGERS IV SOLN
INTRAVENOUS | Status: DC
  Administered 2021-05-04: 1000 mL via INTRAVENOUS

## 2021-05-04 NOTE — Anesthesia Postprocedure Evaluation (Signed)
Anesthesia Post Note  Patient: Brenda Taylor  Procedure(s) Performed: COLONOSCOPY WITH PROPOFOL ESOPHAGOGASTRODUODENOSCOPY (EGD) WITH PROPOFOL POLYPECTOMY BIOPSY  Patient location during evaluation: Phase II Anesthesia Type: General Level of consciousness: awake and alert and oriented Pain management: pain level controlled Vital Signs Assessment: post-procedure vital signs reviewed and stable Respiratory status: spontaneous breathing and respiratory function stable Cardiovascular status: blood pressure returned to baseline and stable Postop Assessment: no apparent nausea or vomiting Anesthetic complications: no   No notable events documented.   Last Vitals:  Vitals:   05/04/21 0718 05/04/21 0859  BP: 120/80 (!) 92/51  Pulse: 92 82  Resp: 14 16  Temp: 36.7 C (!) 27.8 C  SpO2: 95% 100%    Last Pain:  Vitals:   05/04/21 0859  TempSrc: Oral  PainSc: 4                  Patrcia Schnepp C Haruto Demaria

## 2021-05-04 NOTE — Anesthesia Procedure Notes (Signed)
Date/Time: 05/04/2021 8:28 AM Performed by: Orlie Dakin, CRNA Pre-anesthesia Checklist: Patient identified, Emergency Drugs available, Suction available and Patient being monitored Patient Re-evaluated:Patient Re-evaluated prior to induction Oxygen Delivery Method: Nasal cannula Induction Type: IV induction Placement Confirmation: positive ETCO2

## 2021-05-04 NOTE — Op Note (Signed)
[facility] Patient Name: Brenda Taylor Procedure Date: 05/04/2021 8:07 AM MRN: [id number] Date of Birth: 11-Dec-1970 Attending MD: Elon Alas. Abbey Chatters DO CSN: [id number] Age: 50 Admit Type: Outpatient Procedure:                Upper GI endoscopy Indications:              Abdominal pain in the right upper quadrant,                            Dysphagia, Heartburn Providers:                Elon Alas. Abbey Chatters, DO, Tammy Vaught, RN, Nelma Rothman,                            Technician Referring MD:              Medicines:                See the Anesthesia note for documentation of the                            administered medications Complications:            No immediate complications. Estimated Blood Loss:     Estimated blood loss was minimal. Procedure:                Pre-Anesthesia Assessment:                           - The anesthesia plan was to use monitored                            anesthesia care (MAC).                           After obtaining informed consent, the endoscope was                            passed under direct vision. Throughout the                            procedure, the patient's blood pressure, pulse, and                            oxygen saturations were monitored continuously. The                            [id number]-H190 ([id number]) scope was introduced through the                            mouth, and advanced to the second part of duodenum.                            The upper GI endoscopy was accomplished without                            difficulty. The patient tolerated the  procedure                            well. Scope In: 8:24:46 AM Scope Out: 8:33:33 AM Total Procedure Duration: 0 hours 8 minutes 47 seconds  Findings:      There is no endoscopic evidence of bleeding, areas of erosion,       esophagitis, stenosis, stricture, ulcerations or varices in the entire       esophagus.      Localized moderate inflammation characterized by erosions and  erythema       was found in the gastric antrum. Biopsies were taken with a cold forceps       for Helicobacter pylori testing.      One 5 mm sessile polyp was found in the first portion of the duodenum.       The polyp was removed with a cold snare. Resection and retrieval were       complete. Impression:               - Gastritis. Biopsied.                           - One duodenal polyp. Resected and retrieved. Moderate Sedation:      Per Anesthesia Care Recommendation:           - Patient has a contact number available for                            emergencies. The signs and symptoms of potential                            delayed complications were discussed with the                            patient. Return to normal activities tomorrow.                            Written discharge instructions were provided to the                            patient.                           - Resume previous diet.                           - Continue present medications.                           - Await pathology results.                           - Use a proton pump inhibitor PO BID.                           - Return to GI clinic in 4 months. Procedure Code(s):        --- Professional ---  43251, Esophagogastroduodenoscopy, flexible,                            transoral; with removal of tumor(s), polyp(s), or                            other lesion(s) by snare technique                           43239, 67, Esophagogastroduodenoscopy, flexible,                            transoral; with biopsy, single or multiple Diagnosis Code(s):        --- Professional ---                           K29.70, Gastritis, unspecified, without bleeding                           K31.7, Polyp of stomach and duodenum                           R10.11, Right upper quadrant pain                           R13.10, Dysphagia, unspecified                           R12, Heartburn CPT copyright  2019 American Medical Association. All rights reserved. The codes documented in this report are preliminary and upon coder review may  be revised to meet current compliance requirements. Elon Alas. Abbey Chatters, DO [place] Abbey Chatters, DO 05/04/2021 8:35:51 AM This report has been signed electronically. Number of Addenda: 0

## 2021-05-04 NOTE — Op Note (Signed)
[facility] Patient Name: Brenda Taylor Procedure Date: 05/04/2021 8:36 AM MRN: [id number] Date of Birth: 12-Feb-1971 Attending MD: Elon Alas. Abbey Chatters DO CSN: [id number] Age: 50 Admit Type: Outpatient Procedure:                Colonoscopy Indications:              Screening for colorectal malignant neoplasm Providers:                Elon Alas. Abbey Chatters, DO, Tammy Vaught, RN, Nelma Rothman,                            Technician Referring MD:              Medicines:                See the Anesthesia note for documentation of the                            administered medications Complications:            No immediate complications. Estimated Blood Loss:     Estimated blood loss was minimal. Procedure:                Pre-Anesthesia Assessment:                           - The anesthesia plan was to use monitored                            anesthesia care (MAC).                           After obtaining informed consent, the colonoscope                            was passed under direct vision. Throughout the                            procedure, the patient's blood pressure, pulse, and                            oxygen saturations were monitored continuously. The                            [id number]-HQ190L ([id number]) scope was introduced through                            the anus and advanced to the the cecum, identified                            by appendiceal orifice and ileocecal valve. The                            colonoscopy was performed without difficulty. The                            patient tolerated the procedure well. The  quality                            of the bowel preparation was evaluated using the                            BBPS [facility] Bowel Preparation Scale) with scores                            of: Right Colon = 2 (minor amount of residual                            staining, small fragments of stool and/or opaque                            liquid, but mucosa seen  well), Transverse Colon = 3                            (entire mucosa seen well with no residual staining,                            small fragments of stool or opaque liquid) and Left                            Colon = 3 (entire mucosa seen well with no residual                            staining, small fragments of stool or opaque                            liquid). The total BBPS score equals 8. The quality                            of the bowel preparation was good. Scope In: 8:38:18 AM Scope Out: 8:54:08 AM Scope Withdrawal Time: 0 hours 13 minutes 16 seconds  Total Procedure Duration: 0 hours 15 minutes 50 seconds  Findings:      The perianal and digital rectal examinations were normal.      Non-bleeding internal hemorrhoids were found during endoscopy.      Four sessile polyps were found in the descending colon and transverse       colon. The polyps were 4 to 7 mm in size. These polyps were removed with       a cold snare. Resection and retrieval were complete.      Two semi-pedunculated polyps were found in the rectum. The polyps were 3       to 5 mm in size. These polyps were removed with a cold snare. Resection       and retrieval were complete. Impression:               - Non-bleeding internal hemorrhoids.                           - Four 4 to 7 mm polyps in the descending colon and  in the transverse colon, removed with a cold snare.                            Resected and retrieved.                           - Two 3 to 5 mm polyps in the rectum, removed with                            a cold snare. Resected and retrieved. Moderate Sedation:      Per Anesthesia Care Recommendation:           - Patient has a contact number available for                            emergencies. The signs and symptoms of potential                            delayed complications were discussed with the                            patient. Return to normal activities  tomorrow.                            Written discharge instructions were provided to the                            patient.                           - Resume previous diet.                           - Continue present medications.                           - Await pathology results.                           - Repeat colonoscopy in 5 years for surveillance.                           - Return to GI clinic in 4 months. Procedure Code(s):        --- Professional ---                           [phone], Colonoscopy, flexible; with removal of                            tumor(s), polyp(s), or other lesion(s) by snare                            technique Diagnosis Code(s):        --- Professional ---  Z12.11, Encounter for screening for malignant                            neoplasm of colon                           K63.5, Polyp of colon                           K62.1, Rectal polyp                           K64.8, Other hemorrhoids CPT copyright 2019 American Medical Association. All rights reserved. The codes documented in this report are preliminary and upon coder review may  be revised to meet current compliance requirements. Elon Alas. Abbey Chatters, DO [place] Cadance Raus, DO 05/04/2021 8:57:11 AM This report has been signed electronically. Number of Addenda: 0

## 2021-05-04 NOTE — Transfer of Care (Signed)
Immediate Anesthesia Transfer of Care Note  Patient: Brenda Taylor  Procedure(s) Performed: COLONOSCOPY WITH PROPOFOL ESOPHAGOGASTRODUODENOSCOPY (EGD) WITH PROPOFOL POLYPECTOMY BIOPSY  Patient Location: Short Stay  Anesthesia Type:General  Level of Consciousness: awake and patient cooperative  Airway & Oxygen Therapy: Patient Spontanous Breathing  Post-op Assessment: Report given to RN and Post -op Vital signs reviewed and stable  Post vital signs: Reviewed and stable  Last Vitals:  Vitals Value Taken Time  BP    Temp    Pulse    Resp    SpO2      Last Pain:  Vitals:   05/04/21 0820  TempSrc:   PainSc: 4       Patients Stated Pain Goal: 7 (05/39/76 7341)  Complications: No notable events documented.

## 2021-05-04 NOTE — Discharge Instructions (Addendum)
EGD Discharge instructions Please read the instructions outlined below and refer to this sheet in the next few weeks. These discharge instructions provide you with general information on caring for yourself after you leave the hospital. Your doctor may also give you specific instructions. While your treatment has been planned according to the most current medical practices available, unavoidable complications occasionally occur. If you have any problems or questions after discharge, please call your doctor. ACTIVITY You may resume your regular activity but move at a slower pace for the next 24 hours.  Take frequent rest periods for the next 24 hours.  Walking will help expel (get rid of) the air and reduce the bloated feeling in your abdomen.  No driving for 24 hours (because of the anesthesia (medicine) used during the test).  You may shower.  Do not sign any important legal documents or operate any machinery for 24 hours (because of the anesthesia used during the test).  NUTRITION Drink plenty of fluids.  You may resume your normal diet.  Begin with a light meal and progress to your normal diet.  Avoid alcoholic beverages for 24 hours or as instructed by your caregiver.  MEDICATIONS You may resume your normal medications unless your caregiver tells you otherwise.  WHAT YOU CAN EXPECT TODAY You may experience abdominal discomfort such as a feeling of fullness or "gas" pains.  FOLLOW-UP Your doctor will discuss the results of your test with you.  SEEK IMMEDIATE MEDICAL ATTENTION IF ANY OF THE FOLLOWING OCCUR: Excessive nausea (feeling sick to your stomach) and/or vomiting.  Severe abdominal pain and distention (swelling).  Trouble swallowing.  Temperature over 101 F (37.8 C).  Rectal bleeding or vomiting of blood.     Colonoscopy Discharge Instructions  Read the instructions outlined below and refer to this sheet in the next few weeks. These discharge instructions provide you with  general information on caring for yourself after you leave the hospital. Your doctor may also give you specific instructions. While your treatment has been planned according to the most current medical practices available, unavoidable complications occasionally occur.   ACTIVITY You may resume your regular activity, but move at a slower pace for the next 24 hours.  Take frequent rest periods for the next 24 hours.  Walking will help get rid of the air and reduce the bloated feeling in your belly (abdomen).  No driving for 24 hours (because of the medicine (anesthesia) used during the test).   Do not sign any important legal documents or operate any machinery for 24 hours (because of the anesthesia used during the test).  NUTRITION Drink plenty of fluids.  You may resume your normal diet as instructed by your doctor.  Begin with a light meal and progress to your normal diet. Heavy or fried foods are harder to digest and may make you feel sick to your stomach (nauseated).  Avoid alcoholic beverages for 24 hours or as instructed.  MEDICATIONS You may resume your normal medications unless your doctor tells you otherwise.  WHAT YOU CAN EXPECT TODAY Some feelings of bloating in the abdomen.  Passage of more gas than usual.  Spotting of blood in your stool or on the toilet paper.  IF YOU HAD POLYPS REMOVED DURING THE COLONOSCOPY: No aspirin products for 7 days or as instructed.  No alcohol for 7 days or as instructed.  Eat a soft diet for the next 24 hours.  FINDING OUT THE RESULTS OF YOUR TEST Not all test results are  available during your visit. If your test results are not back during the visit, make an appointment with your caregiver to find out the results. Do not assume everything is normal if you have not heard from your caregiver or the medical facility. It is important for you to follow up on all of your test results.  SEEK IMMEDIATE MEDICAL ATTENTION IF: You have more than a spotting of  blood in your stool.  Your belly is swollen (abdominal distention).  You are nauseated or vomiting.  You have a temperature over 101.  You have abdominal pain or discomfort that is severe or gets worse throughout the day.   Your EGD revealed mild amount inflammation in your stomach and small bowel.  I took biopsies of this to rule out infection with a bacteria called H. pylori.  You also had one small polyp in your small bowel which I removed. Await pathology results, my office will contact you.  I am going to increase your pantoprazole to twice daily for the next 12 weeks. Avoid NSAIDs as best as you can.   Your colonoscopy revealed 6 polyp(s) which I removed successfully. Await pathology results, my office will contact you. I recommend repeating colonoscopy in 5 years for surveillance purposes.   Follow up with GI in 4 months.   I hope you have a great rest of your week!  Elon Alas. Abbey Chatters, D.O. Gastroenterology and Hepatology [facility]

## 2021-05-04 NOTE — Anesthesia Preprocedure Evaluation (Addendum)
Anesthesia Evaluation  Patient identified by MRN, date of birth, ID band Patient awake    Reviewed: Allergy & Precautions, NPO status , Patient's Chart, lab work & pertinent test results  History of Anesthesia Complications Negative for: history of anesthetic complications  Airway Mallampati: II  TM Distance: >3 FB Neck ROM: Limited   Comment: Cervical spine fusion Dental  (+) Dental Advisory Given, Edentulous Upper, Missing   Pulmonary Current SmokerPatient did not abstain from smoking.,    Pulmonary exam normal breath sounds clear to auscultation       Cardiovascular Exercise Tolerance: Good hypertension, Pt. on medications Normal cardiovascular exam Rhythm:Regular Rate:Normal     Neuro/Psych  Headaches, Anxiety  Neuromuscular disease (right hand weakness, peripheral neuropathy)    GI/Hepatic PUD, GERD  Medicated,(+)     substance abuse (last used 3 days ago)  marijuana use,   Endo/Other  Morbid obesity  Renal/GU negative Renal ROS     Musculoskeletal  (+) Arthritis , Osteoarthritis,    Abdominal   Peds  Hematology negative hematology ROS (+)   Anesthesia Other Findings   Reproductive/Obstetrics                           Anesthesia Physical Anesthesia Plan  ASA: 3  Anesthesia Plan: General   Post-op Pain Management:    Induction: Intravenous  PONV Risk Score and Plan: TIVA  Airway Management Planned: Nasal Cannula and Natural Airway  Additional Equipment:   Intra-op Plan:   Post-operative Plan:   Informed Consent: I have reviewed the patients History and Physical, chart, labs and discussed the procedure including the risks, benefits and alternatives for the proposed anesthesia with the patient or authorized representative who has indicated his/her understanding and acceptance.     Dental advisory given  Plan Discussed with: CRNA and Surgeon  Anesthesia Plan  Comments:         Anesthesia Quick Evaluation

## 2021-05-04 NOTE — Interval H&P Note (Signed)
History and Physical Interval Note:  05/04/2021 7:35 AM  Brenda Taylor  has presented today for surgery, with the diagnosis of screening colonoscopy, early satiety, right upper quadrant pain.  The various methods of treatment have been discussed with the patient and family. After consideration of risks, benefits and other options for treatment, the patient has consented to  Procedure(s) with comments: COLONOSCOPY WITH PROPOFOL (N/A) - 9:45am ESOPHAGOGASTRODUODENOSCOPY (EGD) WITH PROPOFOL (N/A) BALLOON DILATION (N/A) as a surgical intervention.  The patient's history has been reviewed, patient examined, no change in status, stable for surgery.  I have reviewed the patient's chart and labs.  Questions were answered to the patient's satisfaction.     [name]

## 2021-05-05 LAB — SURGICAL PATHOLOGY

## 2021-05-08 ENCOUNTER — Ambulatory Visit (INDEPENDENT_AMBULATORY_CARE_PROVIDER_SITE_OTHER): Payer: 59 | Admitting: Obstetrics & Gynecology

## 2021-05-08 ENCOUNTER — Encounter: Payer: Self-pay | Admitting: Obstetrics & Gynecology

## 2021-05-08 ENCOUNTER — Other Ambulatory Visit: Payer: Self-pay

## 2021-05-08 VITALS — BP 120/78 | HR 88 | Ht 67.0 in | Wt 273.0 lb

## 2021-05-08 DIAGNOSIS — R102 Pelvic and perineal pain: Secondary | ICD-10-CM

## 2021-05-08 DIAGNOSIS — Z30431 Encounter for routine checking of intrauterine contraceptive device: Secondary | ICD-10-CM

## 2021-05-08 DIAGNOSIS — N871 Moderate cervical dysplasia: Secondary | ICD-10-CM

## 2021-05-08 NOTE — Progress Notes (Signed)
   GYN VISIT Patient name: Brenda Taylor MRN [id number]  Date of birth: 07-21-1971 Chief Complaint:   check iud  History of Present Illness:   Brenda Taylor is a 50 y.o. [id number] perimenopausal female being seen today for follow upregarding:  AUB- IUD replaced on 01/11/21- Today she notes no vaginal bleeding.  Overall doing well with this device.  Pelvic pain: On occasion will note pain on her right side.  Notes "burning" sensation on her right, mostly when she is standing for a prolonged period. This occurs daily, no improvement with medication. Pain did start prior to IUD placement, will note improvement when she shifts her weight off her right hip.  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 (Menstrual status: IUD).  Depression screen [facility] 2/9 12/26/2020 12/15/2020 11/11/2020 08/31/2020 06/01/2020  Decreased Interest 1 0 0 0 1  Down, Depressed, Hopeless 1 0 0 0 1  PHQ - 2 Score 2 0 0 0 2  Altered sleeping 1 - - - 1  Tired, decreased energy 1 - - - 1  Change in appetite 1 - - - 0  Feeling bad or failure about yourself  0 - - - 0  Trouble concentrating 0 - - - 0  Moving slowly or fidgety/restless 0 - - - 0  Suicidal thoughts 0 - - - 0  PHQ-9 Score 5 - - - 4  Difficult doing work/chores - - - - Somewhat difficult     Review of Systems:   Pertinent items are noted in HPI Denies fever/chills, dizziness, headaches, visual disturbances, fatigue, shortness of breath, chest pain, abdominal pain, vomiting, bowel movements, urination, or intercourse unless otherwise stated above.  Pertinent History Reviewed:  Reviewed past medical,surgical, social, obstetrical and family history.  Reviewed problem list, medications and allergies. Physical Assessment:   Vitals:   05/08/21 1505  BP: 120/78  Pulse: 88  Weight: 273 lb (123.8 kg)  Height: 5\' 7"  (1.702 m)  Body mass index is 42.76 kg/m.       Physical Examination:   General appearance: alert, well  appearing, and in no distress  Psych: mood appropriate, normal affect  Skin: warm & dry   Cardiovascular: normal heart rate noted  Respiratory: normal respiratory effort, no distress  Abdomen: obese, soft, non-tender, +RLQ tenderness  Pelvic: VULVA: normal appearing vulva with no masses, tenderness or lesions, VAGINA: normal appearing vagina with normal color and discharge, no lesions, CERVIX: normal appearing cervix without discharge or lesions- strings visualized, UTERUS: uterus is normal size, shape, consistency and nontender, ADNEXA: no masses though exam limited due to body habitus  Extremities: no edema   Chaperone: Latisha Cresenzo    Assessment & Plan:  1) Pelvic pain -plan for pelvic US to r/o underlying etiologies -if negative advise f/u with PCP  2) IUD check -device in proper location  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   Return in about 9 months (around 02/14/2022) for Annual and pelvic US (next available)- pelvic pain.   Janyth Pupa, DO Attending [place], [facility] for [employer], [place]

## 2021-05-11 ENCOUNTER — Telehealth: Payer: Self-pay | Admitting: Internal Medicine

## 2021-05-11 ENCOUNTER — Other Ambulatory Visit: Payer: Self-pay | Admitting: Internal Medicine

## 2021-05-11 NOTE — Telephone Encounter (Signed)
Pt called to see if Dr Abbey Chatters would prescribe her Linzess and could she try the samples first. Please advise. [phone]

## 2021-05-11 NOTE — Telephone Encounter (Signed)
Pt called wanting to try some samples of Linzess. (Dr Abbey Chatters has not seen this pt before). I seen where she was given a sample box of Linzess 290 mcg. Please advise

## 2021-05-11 NOTE — Telephone Encounter (Signed)
Dr. Abbey Chatters did her colonoscopy last week. She was given Linzess samples by me to help with bowel prep.  We can given her some samples of 221mcg two boxes. Let me know if/when she wants RX.

## 2021-05-12 NOTE — Telephone Encounter (Signed)
Phoned the pt and LMOVM for the pt to return call 

## 2021-05-12 NOTE — Telephone Encounter (Signed)
FYI: pt returned call and was advised of the 2 sample boxes of Linzess 290 mcg left up front for her and she agrees to let us know after another week of taking meds to call and advised of Rx being sent in.

## 2021-05-15 ENCOUNTER — Other Ambulatory Visit: Payer: Self-pay

## 2021-05-15 ENCOUNTER — Encounter: Payer: Self-pay | Admitting: Internal Medicine

## 2021-05-15 ENCOUNTER — Ambulatory Visit (INDEPENDENT_AMBULATORY_CARE_PROVIDER_SITE_OTHER): Payer: 59 | Admitting: Internal Medicine

## 2021-05-15 VITALS — BP 112/73 | HR 79 | Temp 97.7°F | Ht 66.0 in | Wt 274.0 lb

## 2021-05-15 DIAGNOSIS — F419 Anxiety disorder, unspecified: Secondary | ICD-10-CM

## 2021-05-15 DIAGNOSIS — L304 Erythema intertrigo: Secondary | ICD-10-CM

## 2021-05-15 DIAGNOSIS — Z23 Encounter for immunization: Secondary | ICD-10-CM

## 2021-05-15 DIAGNOSIS — E782 Mixed hyperlipidemia: Secondary | ICD-10-CM

## 2021-05-15 DIAGNOSIS — G609 Hereditary and idiopathic neuropathy, unspecified: Secondary | ICD-10-CM | POA: Diagnosis not present

## 2021-05-15 DIAGNOSIS — R7303 Prediabetes: Secondary | ICD-10-CM

## 2021-05-15 MED ORDER — KETOCONAZOLE 2 % EX CREA: 1.0000 "application " | TOPICAL_CREAM | Freq: Every day | CUTANEOUS | 0 refills | Status: DC

## 2021-05-15 MED ORDER — ALPRAZOLAM 0.5 MG PO TABS: 0.5000 mg | ORAL_TABLET | Freq: Every evening | ORAL | 2 refills | Status: DC | PRN

## 2021-05-15 MED ORDER — GABAPENTIN 100 MG PO CAPS: 200.0000 mg | ORAL_CAPSULE | Freq: Two times a day (BID) | ORAL | 2 refills | Status: DC

## 2021-05-15 NOTE — Patient Instructions (Signed)
Please start taking gabapentin 200 mg twice daily instead of 100 mg.  Start taking Xanax 0.5 mg as needed for insomnia/anxiety at bedtime.  Please apply ketoconazole cream for rash.  Continue to follow-up DASH diet and perform moderate exercise/walking at least 150 minutes/week.

## 2021-05-16 LAB — CBC
Hematocrit: 34.3 % (ref 34.0–46.6)
Hemoglobin: 11.7 g/dL (ref 11.1–15.9)
MCH: 30.5 pg (ref 26.6–33.0)
MCHC: 34.1 g/dL (ref 31.5–35.7)
MCV: 89 fL (ref 79–97)
Platelets: 274 10*3/uL (ref 150–450)
RBC: 3.84 x10E6/uL (ref 3.77–5.28)
RDW: 12 % (ref 11.7–15.4)
WBC: 11.2 10*3/uL — ABNORMAL HIGH (ref 3.4–10.8)

## 2021-05-16 LAB — CMP14+EGFR
ALT: 14 IU/L (ref 0–32)
AST: 16 IU/L (ref 0–40)
Albumin/Globulin Ratio: 1.4 (ref 1.2–2.2)
Albumin: 4.3 g/dL (ref 3.8–4.9)
Alkaline Phosphatase: 95 IU/L (ref 44–121)
BUN/Creatinine Ratio: 26 — ABNORMAL HIGH (ref 9–23)
BUN: 26 mg/dL — ABNORMAL HIGH (ref 6–24)
Bilirubin Total: <0.2 mg/dL (ref 0.0–1.2)
CO2: 22 mmol/L (ref 20–29)
Calcium: 9.4 mg/dL (ref 8.7–10.2)
Chloride: 106 mmol/L (ref 96–106)
Creatinine, Ser: 1.01 mg/dL — ABNORMAL HIGH (ref 0.57–1.00)
Globulin, Total: 3.1 g/dL (ref 1.5–4.5)
Glucose: 109 mg/dL — ABNORMAL HIGH (ref 70–99)
Potassium: 4.2 mmol/L (ref 3.5–5.2)
Sodium: 143 mmol/L (ref 134–144)
Total Protein: 7.4 g/dL (ref 6.0–8.5)
eGFR: 67 mL/min/{1.73_m2} (ref >59)

## 2021-05-16 LAB — LIPID PANEL
Chol/HDL Ratio: 5 ratio — ABNORMAL HIGH (ref 0.0–4.4)
Cholesterol, Total: 156 mg/dL (ref 100–199)
HDL: 31 mg/dL — ABNORMAL LOW (ref >39)
LDL Chol Calc (NIH): 105 mg/dL — ABNORMAL HIGH (ref 0–99)
Triglycerides: 110 mg/dL (ref 0–149)
VLDL Cholesterol Cal: 20 mg/dL (ref 5–40)

## 2021-05-16 LAB — HEMOGLOBIN A1C
Est. average glucose Bld gHb Est-mCnc: 126 mg/dL
Hgb A1c MFr Bld: 6 % — ABNORMAL HIGH (ref 4.8–5.6)

## 2021-05-19 NOTE — Assessment & Plan Note (Signed)
HbA1C: 5.7 Advised to follow diabetic diet On ACEi F/u CMP and lipid panel 

## 2021-05-19 NOTE — Assessment & Plan Note (Signed)
Uncontrolled with BuSpar and Xanax Increased dose of Xanax to 0.5 mg qHS PRN

## 2021-05-19 NOTE — Progress Notes (Signed)
Acute Office Visit  Subjective:    Patient ID: Brenda Taylor, female    DOB: January 05, 1971, 50 y.o.   MRN: [id number]  Chief Complaint  Patient presents with   Acute Visit    Rash under     HPI Patient is in today for c/o rash underneath her breasts, which is erythematous with whitish patches.  She also c/o having chronic foot pain, worse at nighttime. She takes Gabapentin 100 mg BID currently. She also c/o b/l hand numbness and tingling, which have been worse recently. Denies any recent injury.  She has been having anxiety spells despite taking Xanax 0.25 mg, and at times, she has to take it BID in a day. She denies anhedonia, SI or HI. Denies any tremors or palpitations.  She received flu vaccine and Shingrix #1 vaccine today.   Past Medical History:  Diagnosis Date   Anxiety    Chronic pain    DDD (degenerative disc disease)    GERD (gastroesophageal reflux disease)    HBP (high blood pressure)    Headache    Hernia    Multiple gastric ulcers    Pre-diabetes    Rotator cuff syndrome of left shoulder 10/09/2011   Ulcer disease     Past Surgical History:  Procedure Laterality Date   APPENDECTOMY     BACK SURGERY     x5   BIOPSY  05/04/2021   Procedure: BIOPSY;  Surgeon: [name], DO;  [facility]: [facility];  Service: Endoscopy;;   BREAST SURGERY     CARPAL TUNNEL RELEASE     bilateral   CHOLECYSTECTOMY     COLONOSCOPY  10/20/2009   HUT:MLYYTKP anal canal/left-side diverticula and multiple polyps (hyperplastic). poor prep compromised exam. Next TCS 09/2014.   COLONOSCOPY WITH PROPOFOL N/A 05/04/2021   Procedure: COLONOSCOPY WITH PROPOFOL;  Surgeon: [name], DO;  Location: [facility];  Service: Endoscopy;  Laterality: N/A;  9:45am   ESOPHAGOGASTRODUODENOSCOPY  03/06/2010   TWS:FKCL quadrant distal esophageal/patent tubular esophagus/small HH, antral erosions and ulcerations. Bx negative for persistent H.pylori   ESOPHAGOGASTRODUODENOSCOPY   10/20/2009   EXN:TZGYF HH/prepyloric antral ulcer s/p bx (+h.pylori), ERE   ESOPHAGOGASTRODUODENOSCOPY (EGD) WITH PROPOFOL N/A 05/04/2021   Procedure: ESOPHAGOGASTRODUODENOSCOPY (EGD) WITH PROPOFOL;  Surgeon: [name], DO;  [facility]: [facility];  Service: Endoscopy;  Laterality: N/A;   INTRAUTERINE DEVICE INSERTION     LEEP N/A 02/14/2021   Procedure: LOOP ELECTROSURGICAL EXCISION PROCEDURE (LEEP);  Surgeon: Janyth Pupa, DO;  Location: [facility];  Service: Gynecology;  Laterality: N/A;   MASTECTOMY, PARTIAL Left    x 2   MOUTH SURGERY     POLYPECTOMY  05/04/2021   Procedure: POLYPECTOMY;  Surgeon: [name], DO;  Location: [facility];  Service: Endoscopy;;   ROTATOR CUFF REPAIR     right   TUBAL LIGATION      Family History  Problem Relation Age of Onset   Heart disease Other    Arthritis Other    Cancer Other    Asthma Other    Diabetes Other    Heart disease Mother    Diabetes Mother    Cancer Mother    Non-Hodgkin's lymphoma Mother    Hypertension Mother    Arthritis Mother    Lung cancer Father    Neuropathy Father    Hypertension Father     Social History   Socioeconomic History   Marital status: Widowed    Spouse name:  Not on file   Number of children: Not on file   Years of education: 9   Highest education level: Not on file  Occupational History   Occupation: Scientist, water quality  Tobacco Use   Smoking status: Every Day    Packs/day: 1.00    Types: Cigarettes   Smokeless tobacco: Never  Vaping Use   Vaping Use: Never used  Substance and Sexual Activity   Alcohol use: No   Drug use: Yes    Frequency: 7.0 times per week    Types: Marijuana    Comment: twice daily   Sexual activity: Yes    Birth control/protection: I.U.D.  Other Topics Concern   Not on file  Social History Narrative   Not on file   Social Determinants of Health   Financial Resource Strain: Medium Risk   Difficulty of Paying Living Expenses: Somewhat hard  Food  Insecurity: No Food Insecurity   Worried About Running Out of Food in the Last Year: Never true   Ran Out of Food in the Last Year: Never true  Transportation Needs: No Transportation Needs   Lack of Transportation (Medical): No   Lack of Transportation (Non-Medical): No  Physical Activity: Inactive   Days of Exercise per Week: 0 days   Minutes of Exercise per Session: 0 min  Stress: Stress Concern Present   Feeling of Stress : To some extent  Social Connections: Socially Isolated   Frequency of Communication with Friends and Family: Three times a week   Frequency of Social Gatherings with Friends and Family: Once a week   Attends Religious Services: Never   Marine scientist or Organizations: Not on file   Attends Archivist Meetings: Never   Marital Status: Widowed  Human resources officer Violence: Not At Risk   Fear of Current or Ex-Partner: No   Emotionally Abused: No   Physically Abused: No   Sexually Abused: No    Outpatient Medications Prior to Visit  Medication Sig Dispense Refill   Aspirin-Salicylamide-Caffeine (BC HEADACHE POWDER PO) Take 1 Package by mouth daily as needed (headache/pain).     benzonatate (TESSALON) 100 MG capsule TAKE 1 CAPSULE BY MOUTH TWICE A DAY AS NEEDED FOR COUGH. 30 capsule 0   busPIRone (BUSPAR) 15 MG tablet Take 1 tablet (15 mg total) by mouth in the morning. 90 tablet 0   hydrochlorothiazide (HYDRODIURIL) 25 MG tablet Take 1 tablet (25 mg total) by mouth in the morning. 90 tablet 1   lisinopril (ZESTRIL) 40 MG tablet TAKE ONE TABLET BY MOUTH ONCE DAILY. 90 tablet 0   pantoprazole (PROTONIX) 40 MG tablet Take 1 tablet (40 mg total) by mouth 2 (two) times daily before a meal. 60 tablet 11   Vitamin D, Ergocalciferol, (DRISDOL) 1.25 MG (50000 UNIT) CAPS capsule TAKE 1 CAPSULE THE SAME DAY EACH WEEK. 5 capsule 0   ALPRAZolam (XANAX) 0.25 MG tablet Take 1 tablet (0.25 mg total) by mouth at bedtime as needed for anxiety. (Patient taking  differently: Take 0.25 mg by mouth at bedtime.) 30 tablet 2   gabapentin (NEURONTIN) 100 MG capsule TAKE 1 CAPSULE BY MOUTH 2 TIMES DAILY. 60 capsule 0   No facility-administered medications prior to visit.    Allergies  Allergen Reactions   Amoxicillin Hives   Aspirin     Cannot take due to ulcers   Nsaids     Cannot take due to ulcers     Review of Systems  Constitutional:  Negative for chills and  fever.  HENT:  Positive for hearing loss (Right sided). Negative for congestion, postnasal drip, sinus pressure, sinus pain and sore throat.   Eyes:  Negative for pain and discharge.  Respiratory:  Negative for cough and shortness of breath.   Cardiovascular:  Negative for chest pain and palpitations.  Gastrointestinal:  Negative for abdominal pain, diarrhea, nausea and vomiting.  Endocrine: Negative for polydipsia and polyuria.  Genitourinary:  Negative for dysuria and hematuria.  Musculoskeletal:  Positive for arthralgias. Negative for neck pain and neck stiffness.  Skin:  Positive for rash.  Neurological:  Positive for numbness. Negative for dizziness, seizures, syncope, speech difficulty and light-headedness.  Psychiatric/Behavioral:  Positive for decreased concentration and sleep disturbance. Negative for agitation and behavioral problems. The patient is nervous/anxious.       Objective:    Physical Exam Vitals reviewed.  Constitutional:      General: She is not in acute distress.    Appearance: She is obese. She is not diaphoretic.  HENT:     Head: Normocephalic and atraumatic.     Nose: Nose normal. No congestion.     Mouth/Throat:     Mouth: Mucous membranes are moist.     Pharynx: No posterior oropharyngeal erythema.  Eyes:     General: No scleral icterus.    Extraocular Movements: Extraocular movements intact.  Neck:     Vascular: No carotid bruit.  Cardiovascular:     Rate and Rhythm: Normal rate and regular rhythm.     Pulses: Normal pulses.     Heart sounds:  Normal heart sounds. No murmur heard. Pulmonary:     Breath sounds: Normal breath sounds. No wheezing or rales.  Abdominal:     Palpations: Abdomen is soft.     Tenderness: There is no abdominal tenderness.  Musculoskeletal:     Cervical back: Neck supple. No tenderness.     Right lower leg: No edema.     Left lower leg: No edema.  Skin:    General: Skin is warm.     Findings: Rash (Whitish patches over erythematous base) present.  Neurological:     General: No focal deficit present.     Mental Status: She is alert and oriented to person, place, and time.     Sensory: No sensory deficit.     Motor: No weakness.  Psychiatric:        Mood and Affect: Mood normal.        Behavior: Behavior normal.    BP 112/73 (BP Location: Left Arm, Patient Position: Sitting, Cuff Size: Large)   Pulse 79   Temp 97.7 F (36.5 C) (Oral)   Ht _0  (1.676 m)   Wt 274 lb (124.3 kg)   SpO2 98%   BMI 44.22 kg/m  Wt Readings from Last 3 Encounters:  05/15/21 274 lb (124.3 kg)  05/08/21 273 lb (123.8 kg)  04/25/21 264 lb (119.7 kg)    Health Maintenance Due  Topic Date Due   Pneumococcal Vaccine 26-24 Years old (1 - PCV) Never done   Hepatitis C Screening  Never done   MAMMOGRAM  09/22/2018    There are no preventive care reminders to display for this patient.   Lab Results  Component Value Date   TSH 1.730 09/14/2020   Lab Results  Component Value Date   WBC 11.2 (H) 05/15/2021   HGB 11.7 05/15/2021   HCT 34.3 05/15/2021   MCV 89 05/15/2021   PLT 274 05/15/2021   Lab Results  Component Value Date   NA 143 05/15/2021   K 4.2 05/15/2021   CO2 22 05/15/2021   GLUCOSE 109 (H) 05/15/2021   BUN 26 (H) 05/15/2021   CREATININE 1.01 (H) 05/15/2021   BILITOT <0.2 05/15/2021   ALKPHOS 95 05/15/2021   AST 16 05/15/2021   ALT 14 05/15/2021   PROT 7.4 05/15/2021   ALBUMIN 4.3 05/15/2021   CALCIUM 9.4 05/15/2021   ANIONGAP 7 04/25/2021   EGFR 67 05/15/2021   Lab Results   Component Value Date   CHOL 156 05/15/2021   Lab Results  Component Value Date   HDL 31 (L) 05/15/2021   Lab Results  Component Value Date   LDLCALC 105 (H) 05/15/2021   Lab Results  Component Value Date   TRIG 110 05/15/2021   Lab Results  Component Value Date   CHOLHDL 5.0 (H) 05/15/2021   Lab Results  Component Value Date   HGBA1C 6.0 (H) 05/15/2021       Assessment & Plan:   Problem List Items Addressed This Visit       Intertrigo    -  Primary  Relevant Medications  ketoconazole (NIZORAL) 2 % cream    Nervous and Auditory   Idiopathic peripheral neuropathy    No symptom relief with Gabapentin 100 mg BID, increased to 200 mg BID.      Relevant Medications   ALPRAZolam (XANAX) 0.5 MG tablet   gabapentin (NEURONTIN) 100 MG capsule   Other Relevant Orders   CBC (Completed)     Other   Anxiety    Uncontrolled with BuSpar and Xanax Increased dose of Xanax to 0.5 mg qHS PRN      Relevant Medications   ALPRAZolam (XANAX) 0.5 MG tablet   Prediabetes    HbA1C: 5.7 Advised to follow diabetic diet On ACEi F/u CMP and lipid panel      Relevant Orders   CMP14+EGFR (Completed)   HgB A1c (Completed)   Mixed hyperlipidemia    Check lipid profile Follow DASH diet If persistently elevated, will start statin      Relevant Orders   Lipid panel (Completed)   Other Visit Diagnoses        Need for immunization against influenza       Relevant Orders   Flu Vaccine QUAD 62moIM (Fluarix, Fluzone & Alfiuria Quad PF) (Completed)   Need for viral immunization       Relevant Orders   Varicella-zoster vaccine IM (Shingrix) (Completed)        Meds ordered this encounter  Medications   ketoconazole (NIZORAL) 2 % cream    Sig: Apply 1 application topically daily.    Dispense:  15 g    Refill:  0   ALPRAZolam (XANAX) 0.5 MG tablet    Sig: Take 1 tablet (0.5 mg total) by mouth at bedtime as needed for anxiety.    Dispense:  30 tablet    Refill:  2     Dose change   gabapentin (NEURONTIN) 100 MG capsule    Sig: Take 2 capsules (200 mg total) by mouth 2 (two) times daily.    Dispense:  120 capsule    Refill:  2     Chaunda Vandergriff KKeith Rake MD

## 2021-05-19 NOTE — Assessment & Plan Note (Signed)
Check lipid profile Follow DASH diet If persistently elevated, will start statin 

## 2021-05-19 NOTE — Assessment & Plan Note (Signed)
No symptom relief with Gabapentin 100 mg BID, increased to 200 mg BID.

## 2021-05-22 ENCOUNTER — Telehealth: Payer: Self-pay | Admitting: Internal Medicine

## 2021-05-22 ENCOUNTER — Other Ambulatory Visit: Payer: Self-pay | Admitting: Internal Medicine

## 2021-05-22 DIAGNOSIS — L304 Erythema intertrigo: Secondary | ICD-10-CM

## 2021-05-22 MED ORDER — LINACLOTIDE 290 MCG PO CAPS: 290.0000 ug | ORAL_CAPSULE | Freq: Every day | ORAL | 3 refills | Status: DC

## 2021-05-22 NOTE — Telephone Encounter (Signed)
Linzess 290 mcg sent to pharmacy.

## 2021-05-30 ENCOUNTER — Encounter: Payer: Self-pay | Admitting: Internal Medicine

## 2021-05-30 ENCOUNTER — Ambulatory Visit (INDEPENDENT_AMBULATORY_CARE_PROVIDER_SITE_OTHER): Payer: 59 | Admitting: Internal Medicine

## 2021-05-30 ENCOUNTER — Telehealth: Payer: Self-pay | Admitting: Internal Medicine

## 2021-05-30 ENCOUNTER — Other Ambulatory Visit: Payer: Self-pay

## 2021-05-30 DIAGNOSIS — L304 Erythema intertrigo: Secondary | ICD-10-CM | POA: Diagnosis not present

## 2021-05-30 DIAGNOSIS — E559 Vitamin D deficiency, unspecified: Secondary | ICD-10-CM | POA: Diagnosis not present

## 2021-05-30 MED ORDER — NYSTATIN 100000 UNIT/GM EX POWD
1.0000 | Freq: Three times a day (TID) | CUTANEOUS | 0 refills | Status: DC
Start: 2021-05-30 — End: 2021-06-20

## 2021-05-30 MED ORDER — CLOTRIMAZOLE-BETAMETHASONE 1-0.05 % EX CREA: 1.0000 "application " | TOPICAL_CREAM | Freq: Every day | CUTANEOUS | 0 refills | Status: DC

## 2021-05-30 NOTE — Patient Instructions (Signed)
Please stop using ketoconazole cream and start using clotrimazole-betamethasone cream instead.  Please use nystatin powder in the afternoon and in the evening.

## 2021-05-30 NOTE — Telephone Encounter (Signed)
Please put pt in today at [phone] virtual to discuss with provider

## 2021-05-30 NOTE — Telephone Encounter (Signed)
Pt is stating that the rash is no better, and the Vit D makes her feel blah   Please call the pt

## 2021-05-30 NOTE — Assessment & Plan Note (Signed)
Did not like ketoconazole cream and has worsening of the rash now extending to the axilla as well Nystatin powder prescribed Clotrimazole- betamethasone cream started Advised to keep area clean and dry

## 2021-05-30 NOTE — Assessment & Plan Note (Signed)
Last vitamin D Lab Results  Component Value Date   VD25OH 17.1 (L) 09/14/2020   Has fatigue with Vitamin D 50,000 IU Advised to take vitamin D 5000 IU QD instead. 

## 2021-05-30 NOTE — Progress Notes (Signed)
Virtual Visit via Telephone Note   This visit type was conducted due to national recommendations for restrictions regarding the COVID-19 Pandemic (e.g. social distancing) in an effort to limit this patient's exposure and mitigate transmission in our community.  Due to her co-morbid illnesses, this patient is at least at moderate risk for complications without adequate follow up.  This format is felt to be most appropriate for this patient at this time.  The patient did not have access to video technology/had technical difficulties with video requiring transitioning to audio format only (telephone).  All issues noted in this document were discussed and addressed.  No physical exam could be performed with this format.  Evaluation Performed:  Follow-up visit  Date:  05/30/2021   ID:  RANDALL RAMPERSAD, DOB May 08, 1971, MRN [id number]  Patient Location: Home Provider Location: Office/Clinic  Participants: Patient Location of Patient: Home Location of Provider: Telehealth Consent was obtain for visit to be over via telehealth. I verified that I am speaking with the correct person using two identifiers.  PCP:  Lindell Spar, MD   Chief Complaint: Rash  History of Present Illness:    Brenda Taylor is a 50 y.o. female who has a televisit for complaint of rash under her breasts, for which she has tried using ketoconazole cream.  She states that her rash has gotten worse recently.  She gets intense irritation underneath the breasts and axilla when she has excessive sweating at work.  She also reports feeling tired when she takes vitamin D 50,000 IU.  Denies any fever, chills, cough, nausea or vomiting.  The patient does not have symptoms concerning for COVID-19 infection (fever, chills, cough, or new shortness of breath).   Past Medical, Surgical, Social History, Allergies, and Medications have been Reviewed.  Past Medical History:  Diagnosis Date   Anxiety    Chronic pain    DDD  (degenerative disc disease)    GERD (gastroesophageal reflux disease)    HBP (high blood pressure)    Headache    Hernia    Multiple gastric ulcers    Pre-diabetes    Rotator cuff syndrome of left shoulder 10/09/2011   Ulcer disease    Past Surgical History:  Procedure Laterality Date   APPENDECTOMY     BACK SURGERY     x5   BIOPSY  05/04/2021   Procedure: BIOPSY;  Surgeon: [name], DO;  [facility];  Service: Endoscopy;;   BREAST SURGERY     CARPAL TUNNEL RELEASE     bilateral   CHOLECYSTECTOMY     COLONOSCOPY  10/20/2009   ZSW:FUXNATF anal canal/left-side diverticula and multiple polyps (hyperplastic). poor prep compromised exam. Next TCS 09/2014.   COLONOSCOPY WITH PROPOFOL N/A 05/04/2021   Procedure: COLONOSCOPY WITH PROPOFOL;  Surgeon: [name], DO;  [facility];  Service: Endoscopy;  Laterality: N/A;  9:45am   ESOPHAGOGASTRODUODENOSCOPY  03/06/2010   TDD:UKGU quadrant distal esophageal/patent tubular esophagus/small HH, antral erosions and ulcerations. Bx negative for persistent H.pylori   ESOPHAGOGASTRODUODENOSCOPY  10/20/2009   RKY:HCWCB HH/prepyloric antral ulcer s/p bx (+h.pylori), ERE   ESOPHAGOGASTRODUODENOSCOPY (EGD) WITH PROPOFOL N/A 05/04/2021   Procedure: ESOPHAGOGASTRODUODENOSCOPY (EGD) WITH PROPOFOL;  Surgeon: [name], DO;  [facility];  Service: Endoscopy;  Laterality: N/A;   INTRAUTERINE DEVICE INSERTION     LEEP N/A 02/14/2021   Procedure: LOOP ELECTROSURGICAL EXCISION PROCEDURE (LEEP);  Surgeon: Janyth Pupa, DO;  Location: [facility];  Service: Gynecology;  Laterality: N/A;   MASTECTOMY, PARTIAL Left    x 2   MOUTH SURGERY     POLYPECTOMY  05/04/2021   Procedure: POLYPECTOMY;  Surgeon: [name], DO;  [facility];  Service: Endoscopy;;   ROTATOR CUFF REPAIR     right   TUBAL LIGATION       Current Meds  Medication Sig   ALPRAZolam (XANAX) 0.5 MG tablet Take 1 tablet  (0.5 mg total) by mouth at bedtime as needed for anxiety.   Aspirin-Salicylamide-Caffeine (BC HEADACHE POWDER PO) Take 1 Package by mouth daily as needed (headache/pain).   benzonatate (TESSALON) 100 MG capsule TAKE 1 CAPSULE BY MOUTH TWICE A DAY AS NEEDED FOR COUGH.   busPIRone (BUSPAR) 15 MG tablet Take 1 tablet (15 mg total) by mouth in the morning.   gabapentin (NEURONTIN) 100 MG capsule Take 2 capsules (200 mg total) by mouth 2 (two) times daily.   hydrochlorothiazide (HYDRODIURIL) 25 MG tablet Take 1 tablet (25 mg total) by mouth in the morning.   ketoconazole (NIZORAL) 2 % cream APPLY TO AFFECTED AREAS ONCE DAILY.   linaclotide (LINZESS) 290 MCG CAPS capsule Take 1 capsule (290 mcg total) by mouth daily before breakfast.   lisinopril (ZESTRIL) 40 MG tablet TAKE ONE TABLET BY MOUTH ONCE DAILY.   pantoprazole (PROTONIX) 40 MG tablet Take 1 tablet (40 mg total) by mouth 2 (two) times daily before a meal.   Vitamin D, Ergocalciferol, (DRISDOL) 1.25 MG (50000 UNIT) CAPS capsule TAKE 1 CAPSULE THE SAME DAY EACH WEEK.     Allergies:   Amoxicillin, Aspirin, and Nsaids   ROS:   Please see the history of present illness.     All other systems reviewed and are negative.   Labs/Other Tests and Data Reviewed:    Recent Labs: 09/14/2020: TSH 1.730 05/15/2021: ALT 14; BUN 26; Creatinine, Ser 1.01; Hemoglobin 11.7; Platelets 274; Potassium 4.2; Sodium 143   Recent Lipid Panel Lab Results  Component Value Date/Time   CHOL 156 05/15/2021 08:53 AM   TRIG 110 05/15/2021 08:53 AM   HDL 31 (L) 05/15/2021 08:53 AM   CHOLHDL 5.0 (H) 05/15/2021 08:53 AM   LDLCALC 105 (H) 05/15/2021 08:53 AM    Wt Readings from Last 3 Encounters:  05/15/21 274 lb (124.3 kg)  05/08/21 273 lb (123.8 kg)  04/25/21 264 lb (119.7 kg)     ASSESSMENT & PLAN:    Intertrigo Did not like ketoconazole cream and has worsening of the rash now extending to the axilla as well Nystatin powder prescribed Clotrimazole-  betamethasone cream started Advised to keep area clean and dry  Vitamin D deficiency Last vitamin D Lab Results  Component Value Date   VD25OH 17.1 (L) 09/14/2020   Has fatigue with Vitamin D 50,000 IU Advised to take vitamin D 5000 IU QD instead.   Time:   Today, I have spent 13 minutes reviewing the chart, including problem list, medications, and with the patient with telehealth technology discussing the above problems.   Medication Adjustments/Labs and Tests Ordered: Current medicines are reviewed at length with the patient today.  Concerns regarding medicines are outlined above.   Tests Ordered: No orders of the defined types were placed in this encounter.   Medication Changes: No orders of the defined types were placed in this encounter.    Note: This dictation was prepared with Dragon dictation along with smaller phrase technology. Similar sounding words can be transcribed inadequately or may not  be corrected upon review. Any transcriptional errors that result from this process are unintentional.      Disposition:  Follow up  Signed, Lindell Spar, MD  05/30/2021 2:55 PM     [place]

## 2021-06-01 ENCOUNTER — Other Ambulatory Visit: Payer: Self-pay | Admitting: Obstetrics & Gynecology

## 2021-06-01 DIAGNOSIS — Z30431 Encounter for routine checking of intrauterine contraceptive device: Secondary | ICD-10-CM

## 2021-06-02 ENCOUNTER — Other Ambulatory Visit: Payer: 59

## 2021-06-13 ENCOUNTER — Other Ambulatory Visit: Payer: Self-pay | Admitting: Internal Medicine

## 2021-06-19 ENCOUNTER — Other Ambulatory Visit: Payer: Self-pay | Admitting: Internal Medicine

## 2021-06-19 DIAGNOSIS — L304 Erythema intertrigo: Secondary | ICD-10-CM

## 2021-06-22 ENCOUNTER — Ambulatory Visit: Payer: 59 | Admitting: Internal Medicine

## 2021-07-11 ENCOUNTER — Other Ambulatory Visit: Payer: Self-pay | Admitting: Internal Medicine

## 2021-07-11 DIAGNOSIS — I10 Essential (primary) hypertension: Secondary | ICD-10-CM

## 2021-07-18 ENCOUNTER — Encounter: Payer: Self-pay | Admitting: Internal Medicine

## 2021-07-18 ENCOUNTER — Ambulatory Visit (INDEPENDENT_AMBULATORY_CARE_PROVIDER_SITE_OTHER): Payer: 59 | Admitting: Internal Medicine

## 2021-07-18 ENCOUNTER — Other Ambulatory Visit: Payer: Self-pay

## 2021-07-18 VITALS — BP 126/72 | HR 92 | Resp 18 | Ht 66.0 in

## 2021-07-18 DIAGNOSIS — R7303 Prediabetes: Secondary | ICD-10-CM

## 2021-07-18 DIAGNOSIS — E782 Mixed hyperlipidemia: Secondary | ICD-10-CM

## 2021-07-18 DIAGNOSIS — G2581 Restless legs syndrome: Secondary | ICD-10-CM | POA: Insufficient documentation

## 2021-07-18 DIAGNOSIS — F419 Anxiety disorder, unspecified: Secondary | ICD-10-CM

## 2021-07-18 DIAGNOSIS — G609 Hereditary and idiopathic neuropathy, unspecified: Secondary | ICD-10-CM

## 2021-07-18 DIAGNOSIS — I10 Essential (primary) hypertension: Secondary | ICD-10-CM

## 2021-07-18 DIAGNOSIS — L304 Erythema intertrigo: Secondary | ICD-10-CM

## 2021-07-18 MED ORDER — TERBINAFINE HCL 250 MG PO TABS: 250.0000 mg | ORAL_TABLET | Freq: Every day | ORAL | 0 refills | Status: DC

## 2021-07-18 MED ORDER — ROPINIROLE HCL ER 2 MG PO TB24: 2.0000 mg | ORAL_TABLET | Freq: Every day | ORAL | 2 refills | Status: DC

## 2021-07-18 NOTE — Patient Instructions (Signed)
Please start taking Terbinafine for fungal infection.  Please take Ropinirole for restless legs syndrome.  Please continue to take other medications as prescribed.

## 2021-07-19 MED ORDER — ROPINIROLE HCL 2 MG PO TABS: 2.0000 mg | ORAL_TABLET | Freq: Every day | ORAL | 5 refills | Status: DC

## 2021-07-21 NOTE — Assessment & Plan Note (Signed)
Check lipid profile Follow DASH diet If persistently elevated, will start statin 

## 2021-07-21 NOTE — Assessment & Plan Note (Signed)
Symptoms concerning for RLS Started Ropinirole 2 mg qHS for now

## 2021-07-21 NOTE — Assessment & Plan Note (Signed)
Persistent symptoms despite using topical Clotrimazole-betamethasone Started Terbinafine PO Advised to keep area clean and dry

## 2021-07-21 NOTE — Assessment & Plan Note (Signed)
BP: 126/72   Continue Lisinopril and HCTZ Advised to stay compliant with medications Advised DASH diet and moderate exercise as tolerated F/u CMP and lipid profile

## 2021-07-21 NOTE — Assessment & Plan Note (Signed)
Slightly better with Gabapentin 200 mg BID Also has leg cramps better with walking, concern for RLS - started Ropinirole, if persistent symptoms, may increase dose of Gabapentin and Ropinirole

## 2021-07-21 NOTE — Assessment & Plan Note (Signed)
Well-controlled with BuSpar and Xanax Increased dose of Xanax to 0.5 mg qHS PRN in the last visit

## 2021-07-21 NOTE — Assessment & Plan Note (Signed)
HbA1C: 5.7 Advised to follow diabetic diet On ACEi F/u CMP and lipid panel 

## 2021-07-21 NOTE — Progress Notes (Signed)
Established Patient Office Visit  Subjective:  Patient ID: Brenda Taylor, female    DOB: [birth year]  Age: 50 y.o. MRN: [id number]  CC:  Chief Complaint  Patient presents with   Follow-up    6 month follow up    HPI Brenda Taylor is a 50 y.o. female with past medical history of hypertension, anxiety and colonic polyp who presents for f/u of her chronic medical conditions.  HTN: BP is well-controlled. Takes medications regularly. Patient denies headache, dizziness, chest pain, dyspnea or palpitations.  She has been taking gabapentin 200 mg BID. She still c/o b/l leg pain, which is worse at nighttime. Her pain gets slightly better with walking. She also reports leg cramps at nighttime. Denies any fall or recent injury.  Anxiety: Now well-controlled with Buspar and Xanax. Denies any anhedonia, SI or HI.  She continues to have itching underneath her breast.  She has used Lotrisone cream for fungal infection, which improves her rash for few days.   Past Medical History:  Diagnosis Date   Anxiety    Chronic pain    DDD (degenerative disc disease)    GERD (gastroesophageal reflux disease)    HBP (high blood pressure)    Headache    Hernia    Multiple gastric ulcers    Pre-diabetes    Rotator cuff syndrome of left shoulder 10/09/2011   Ulcer disease     Past Surgical History:  Procedure Laterality Date   APPENDECTOMY     BACK SURGERY     x5   BIOPSY  05/04/2021   Procedure: BIOPSY;  Surgeon: [name], DO;  [facility];  Service: Endoscopy;;   BREAST SURGERY     CARPAL TUNNEL RELEASE     bilateral   CHOLECYSTECTOMY     COLONOSCOPY  10/20/2009   LTJ:QZESPQZ anal canal/left-side diverticula and multiple polyps (hyperplastic). poor prep compromised exam. Next TCS 09/2014.   COLONOSCOPY WITH PROPOFOL N/A 05/04/2021   Procedure: COLONOSCOPY WITH PROPOFOL;  Surgeon: [name], DO;  [facility]: [facility];  Service: Endoscopy;  Laterality: N/A;   9:45am   ESOPHAGOGASTRODUODENOSCOPY  03/06/2010   RAQ:TMAU quadrant distal esophageal/patent tubular esophagus/small HH, antral erosions and ulcerations. Bx negative for persistent H.pylori   ESOPHAGOGASTRODUODENOSCOPY  10/20/2009   QJF:HLKTG HH/prepyloric antral ulcer s/p bx (+h.pylori), ERE   ESOPHAGOGASTRODUODENOSCOPY (EGD) WITH PROPOFOL N/A 05/04/2021   Procedure: ESOPHAGOGASTRODUODENOSCOPY (EGD) WITH PROPOFOL;  Surgeon: [name], DO;  [facility];  Service: Endoscopy;  Laterality: N/A;   INTRAUTERINE DEVICE INSERTION     LEEP N/A 02/14/2021   Procedure: LOOP ELECTROSURGICAL EXCISION PROCEDURE (LEEP);  Surgeon: Janyth Pupa, DO;  Location: [facility];  Service: Gynecology;  Laterality: N/A;   MASTECTOMY, PARTIAL Left    x 2   MOUTH SURGERY     POLYPECTOMY  05/04/2021   Procedure: POLYPECTOMY;  Surgeon: [name], DO;  [facility];  Service: Endoscopy;;   ROTATOR CUFF REPAIR     right   TUBAL LIGATION      Family History  Problem Relation Age of Onset   Heart disease Other    Arthritis Other    Cancer Other    Asthma Other    Diabetes Other    Heart disease Mother    Diabetes Mother    Cancer Mother    Non-Hodgkin's lymphoma Mother    Hypertension Mother    Arthritis Mother    Lung cancer Father  Neuropathy Father    Hypertension Father     Social History   Socioeconomic History   Marital status: Widowed    Spouse name: Not on file   Number of children: Not on file   Years of education: 9   Highest education level: Not on file  Occupational History   Occupation: Scientist, water quality  Tobacco Use   Smoking status: Every Day    Packs/day: 1.00    Types: Cigarettes   Smokeless tobacco: Never  Vaping Use   Vaping Use: Never used  Substance and Sexual Activity   Alcohol use: No   Drug use: Yes    Frequency: 7.0 times per week    Types: Marijuana    Comment: twice daily   Sexual activity: Yes    Birth control/protection: I.U.D.   Other Topics Concern   Not on file  Social History Narrative   Not on file   Social Determinants of Health   Financial Resource Strain: Medium Risk   Difficulty of Paying Living Expenses: Somewhat hard  Food Insecurity: No Food Insecurity   Worried About Running Out of Food in the Last Year: Never true   Ran Out of Food in the Last Year: Never true  Transportation Needs: No Transportation Needs   Lack of Transportation (Medical): No   Lack of Transportation (Non-Medical): No  Physical Activity: Inactive   Days of Exercise per Week: 0 days   Minutes of Exercise per Session: 0 min  Stress: Stress Concern Present   Feeling of Stress : To some extent  Social Connections: Socially Isolated   Frequency of Communication with Friends and Family: Three times a week   Frequency of Social Gatherings with Friends and Family: Once a week   Attends Religious Services: Never   Marine scientist or Organizations: Not on file   Attends Archivist Meetings: Never   Marital Status: Widowed  Human resources officer Violence: Not At Risk   Fear of Current or Ex-Partner: No   Emotionally Abused: No   Physically Abused: No   Sexually Abused: No    Outpatient Medications Prior to Visit  Medication Sig Dispense Refill   ALPRAZolam (XANAX) 0.5 MG tablet Take 1 tablet (0.5 mg total) by mouth at bedtime as needed for anxiety. 30 tablet 2   Aspirin-Salicylamide-Caffeine (BC HEADACHE POWDER PO) Take 1 Package by mouth daily as needed (headache/pain).     benzonatate (TESSALON) 100 MG capsule TAKE 1 CAPSULE BY MOUTH TWICE A DAY AS NEEDED FOR COUGH. 30 capsule 0   busPIRone (BUSPAR) 15 MG tablet Take 1 tablet (15 mg total) by mouth in the morning. 90 tablet 0   clotrimazole-betamethasone (LOTRISONE) cream Apply 1 application topically daily. 30 g 0   gabapentin (NEURONTIN) 100 MG capsule Take 2 capsules (200 mg total) by mouth 2 (two) times daily. 120 capsule 2   hydrochlorothiazide (HYDRODIURIL)  25 MG tablet TAKE 1 TABLET BY MOUTH ONCE A DAY. 90 tablet 0   ketoconazole (NIZORAL) 2 % cream APPLY TO AFFECTED AREAS ONCE DAILY. 15 g 0   linaclotide (LINZESS) 290 MCG CAPS capsule Take 1 capsule (290 mcg total) by mouth daily before breakfast. 90 capsule 3   lisinopril (ZESTRIL) 40 MG tablet TAKE ONE TABLET BY MOUTH ONCE DAILY. 90 tablet 0   nystatin (MYCOSTATIN/NYSTOP) powder APPLY TO AFFECTED AREAS THREE TIMES DAILY. 15 g 0   pantoprazole (PROTONIX) 40 MG tablet Take 1 tablet (40 mg total) by mouth 2 (two) times daily before a  meal. 60 tablet 11   No facility-administered medications prior to visit.    Allergies  Allergen Reactions   Amoxicillin Hives   Aspirin     Cannot take due to ulcers   Nsaids     Cannot take due to ulcers     ROS Review of Systems  Constitutional:  Negative for chills and fever.  HENT:  Positive for hearing loss (Right sided). Negative for congestion, postnasal drip, sinus pressure, sinus pain and sore throat.   Eyes:  Negative for pain and discharge.  Respiratory:  Negative for cough and shortness of breath.   Cardiovascular:  Negative for chest pain and palpitations.  Gastrointestinal:  Negative for abdominal pain, diarrhea, nausea and vomiting.  Endocrine: Negative for polydipsia and polyuria.  Genitourinary:  Negative for dysuria and hematuria.  Musculoskeletal:  Positive for arthralgias. Negative for neck pain and neck stiffness.  Skin:  Positive for rash.  Neurological:  Positive for numbness. Negative for dizziness, seizures, syncope, speech difficulty and light-headedness.  Psychiatric/Behavioral:  Positive for decreased concentration and sleep disturbance. Negative for agitation and behavioral problems. The patient is nervous/anxious.      Objective:    Physical Exam Vitals reviewed.  Constitutional:      General: She is not in acute distress.    Appearance: She is obese. She is not diaphoretic.  HENT:     Head: Normocephalic and  atraumatic.     Nose: Nose normal. No congestion.     Mouth/Throat:     Mouth: Mucous membranes are moist.     Pharynx: No posterior oropharyngeal erythema.  Eyes:     General: No scleral icterus.    Extraocular Movements: Extraocular movements intact.  Neck:     Vascular: No carotid bruit.  Cardiovascular:     Rate and Rhythm: Normal rate and regular rhythm.     Pulses: Normal pulses.     Heart sounds: Normal heart sounds. No murmur heard. Pulmonary:     Breath sounds: Normal breath sounds. No wheezing or rales.  Abdominal:     Palpations: Abdomen is soft.     Tenderness: There is no abdominal tenderness.  Musculoskeletal:     Cervical back: Neck supple. No tenderness.     Right lower leg: No edema.     Left lower leg: No edema.  Skin:    General: Skin is warm.     Findings: Rash (Whitish patches over erythematous base) present.  Neurological:     General: No focal deficit present.     Mental Status: She is alert and oriented to person, place, and time.     Sensory: No sensory deficit.     Motor: No weakness.  Psychiatric:        Mood and Affect: Mood normal.        Behavior: Behavior normal.    BP 126/72 (BP Location: Left Arm, Patient Position: Sitting, Cuff Size: Normal)    Pulse 92    Resp 18    Ht _0  (1.676 m)    SpO2 97%    BMI 44.22 kg/m  Wt Readings from Last 3 Encounters:  05/15/21 274 lb (124.3 kg)  05/08/21 273 lb (123.8 kg)  04/25/21 264 lb (119.7 kg)    Lab Results  Component Value Date   TSH 1.730 09/14/2020   Lab Results  Component Value Date   WBC 11.2 (H) 05/15/2021   HGB 11.7 05/15/2021   HCT 34.3 05/15/2021   MCV 89 05/15/2021  PLT 274 05/15/2021   Lab Results  Component Value Date   NA 143 05/15/2021   K 4.2 05/15/2021   CO2 22 05/15/2021   GLUCOSE 109 (H) 05/15/2021   BUN 26 (H) 05/15/2021   CREATININE 1.01 (H) 05/15/2021   BILITOT <0.2 05/15/2021   ALKPHOS 95 05/15/2021   AST 16 05/15/2021   ALT 14 05/15/2021   PROT 7.4  05/15/2021   ALBUMIN 4.3 05/15/2021   CALCIUM 9.4 05/15/2021   ANIONGAP 7 04/25/2021   EGFR 67 05/15/2021   Lab Results  Component Value Date   CHOL 156 05/15/2021   Lab Results  Component Value Date   HDL 31 (L) 05/15/2021   Lab Results  Component Value Date   LDLCALC 105 (H) 05/15/2021   Lab Results  Component Value Date   TRIG 110 05/15/2021   Lab Results  Component Value Date   CHOLHDL 5.0 (H) 05/15/2021   Lab Results  Component Value Date   HGBA1C 6.0 (H) 05/15/2021      Assessment & Plan:   Problem List Items Addressed This Visit       Cardiovascular and Mediastinum   Hypertension    BP: 126/72   Continue Lisinopril and HCTZ Advised to stay compliant with medications Advised DASH diet and moderate exercise as tolerated F/u CMP and lipid profile        Nervous and Auditory   Idiopathic peripheral neuropathy    Slightly better with Gabapentin 200 mg BID Also has leg cramps better with walking, concern for RLS - started Ropinirole, if persistent symptoms, may increase dose of Gabapentin and Ropinirole      Relevant Medications   rOPINIRole (REQUIP) 2 MG tablet     Musculoskeletal and Integument   Intertrigo    Persistent symptoms despite using topical Clotrimazole-betamethasone Started Terbinafine PO Advised to keep area clean and dry      Relevant Medications   terbinafine (LAMISIL) 250 MG tablet     Other   Prediabetes    HbA1C: 5.7 Advised to follow diabetic diet On ACEi F/u CMP and lipid panel      Relevant Orders   CMP14+EGFR   Hemoglobin A1c   Mixed hyperlipidemia    Check lipid profile Follow DASH diet If persistently elevated, will start statin      Relevant Orders   Lipid panel   Restless legs syndrome (RLS) - Primary    Symptoms concerning for RLS Started Ropinirole 2 mg qHS for now      Relevant Medications   rOPINIRole (REQUIP) 2 MG tablet    Meds ordered this encounter  Medications   DISCONTD: rOPINIRole  (REQUIP XL) 2 MG 24 hr tablet    Sig: Take 1 tablet (2 mg total) by mouth at bedtime.    Dispense:  30 tablet    Refill:  2   terbinafine (LAMISIL) 250 MG tablet    Sig: Take 1 tablet (250 mg total) by mouth daily.    Dispense:  14 tablet    Refill:  0   rOPINIRole (REQUIP) 2 MG tablet    Sig: Take 1 tablet (2 mg total) by mouth at bedtime.    Dispense:  30 tablet    Refill:  5    Follow-up: Return in about 4 months (around 11/16/2021) for Restless legs syndrome and prediabetes.    Lindell Spar, MD

## 2021-08-01 ENCOUNTER — Telehealth: Payer: Self-pay | Admitting: Internal Medicine

## 2021-08-01 MED ORDER — TRULANCE 3 MG PO TABS: 3.0000 mg | ORAL_TABLET | Freq: Every day | ORAL | 3 refills | Status: DC

## 2021-08-01 NOTE — Telephone Encounter (Signed)
RX sent to pharmacy for Trulance.

## 2021-08-01 NOTE — Telephone Encounter (Signed)
Pt was made aware and verbalized understanding.  

## 2021-08-01 NOTE — Addendum Note (Signed)
Addended by: Mahala Menghini on: 08/01/2021 02:27 PM   Modules accepted: Orders

## 2021-08-01 NOTE — Telephone Encounter (Signed)
According to her health plans website Trulance is preferred.

## 2021-08-01 NOTE — Telephone Encounter (Signed)
Pt said her new Insurance (Friday) was making her Linzess go from $15 to $400. She is asking for something else that her insurance will cover. Please advise. [phone]

## 2021-08-02 ENCOUNTER — Telehealth: Payer: Self-pay

## 2021-08-02 NOTE — Telephone Encounter (Signed)
PA for Trulance 3 mg has been approved from 08/02/21 through 08/02/22. Approval letter to be scanned into pt's chart.

## 2021-08-15 ENCOUNTER — Other Ambulatory Visit: Payer: Self-pay | Admitting: Internal Medicine

## 2021-08-31 ENCOUNTER — Ambulatory Visit
Admission: EM | Admit: 2021-08-31 | Discharge: 2021-08-31 | Disposition: A | Discharge status: Home / Self Care | Payer: 59 | Attending: Urgent Care | Admitting: Urgent Care

## 2021-08-31 ENCOUNTER — Telehealth: Payer: 59 | Admitting: Internal Medicine

## 2021-08-31 ENCOUNTER — Other Ambulatory Visit: Payer: Self-pay

## 2021-08-31 ENCOUNTER — Encounter: Payer: Self-pay | Admitting: Emergency Medicine

## 2021-08-31 ENCOUNTER — Ambulatory Visit (INDEPENDENT_AMBULATORY_CARE_PROVIDER_SITE_OTHER): Payer: 59

## 2021-08-31 DIAGNOSIS — R059 Cough, unspecified: Secondary | ICD-10-CM

## 2021-08-31 DIAGNOSIS — F17200 Nicotine dependence, unspecified, uncomplicated: Secondary | ICD-10-CM

## 2021-08-31 DIAGNOSIS — J209 Acute bronchitis, unspecified: Secondary | ICD-10-CM

## 2021-08-31 DIAGNOSIS — R079 Chest pain, unspecified: Secondary | ICD-10-CM

## 2021-08-31 DIAGNOSIS — Z1152 Encounter for screening for COVID-19: Secondary | ICD-10-CM

## 2021-08-31 DIAGNOSIS — R0789 Other chest pain: Secondary | ICD-10-CM | POA: Diagnosis not present

## 2021-08-31 DIAGNOSIS — B349 Viral infection, unspecified: Secondary | ICD-10-CM

## 2021-08-31 DIAGNOSIS — R0602 Shortness of breath: Secondary | ICD-10-CM

## 2021-08-31 MED ORDER — BENZONATATE 100 MG PO CAPS: 100.0000 mg | ORAL_CAPSULE | Freq: Three times a day (TID) | ORAL | 0 refills | Status: DC | PRN

## 2021-08-31 MED ORDER — PREDNISONE 50 MG PO TABS: 50.0000 mg | ORAL_TABLET | Freq: Every day | ORAL | 0 refills | Status: DC

## 2021-08-31 MED ORDER — ALBUTEROL SULFATE HFA 108 (90 BASE) MCG/ACT IN AERS: 1.0000 | INHALATION_SPRAY | Freq: Four times a day (QID) | RESPIRATORY_TRACT | 0 refills | Status: DC | PRN

## 2021-08-31 MED ORDER — PROMETHAZINE-DM 6.25-15 MG/5ML PO SYRP: 5.0000 mL | ORAL_SOLUTION | Freq: Every evening | ORAL | 0 refills | Status: DC | PRN

## 2021-08-31 MED ORDER — LEVOCETIRIZINE DIHYDROCHLORIDE 5 MG PO TABS
5.0000 mg | ORAL_TABLET | Freq: Every evening | ORAL | 0 refills | Status: DC
Start: 2021-08-31 — End: 2022-02-28

## 2021-08-31 NOTE — ED Triage Notes (Signed)
Pt reports sore throat, sinus pressure, headache, productive cough with yellow sputum and intermittent fevers since Tuesday.

## 2021-08-31 NOTE — ED Provider Notes (Signed)
[place]   MRN: [id number] DOB: 1971-06-01  Subjective:   Brenda Taylor is a 51 y.o. female presenting for 3-day history of acute onset throat pain, sinus pressure, headache, productive cough, chest tightness, shortness of breath.  Patient is a heavy smoker, has done 2 packs/day and is down to 1 pack/day recently.  She is previously had to use an albuterol inhaler but needs a refill.  No chest pain, wheezing.  No sick contacts to her knowledge.  Has not had COVID-19.  No current facility-administered medications for this encounter.  Current Outpatient Medications:    ALPRAZolam (XANAX) 0.5 MG tablet, Take 1 tablet (0.5 mg total) by mouth at bedtime as needed for anxiety., Disp: 30 tablet, Rfl: 2   Aspirin-Salicylamide-Caffeine (BC HEADACHE POWDER PO), Take 1 Package by mouth daily as needed (headache/pain)., Disp: , Rfl:    benzonatate (TESSALON) 100 MG capsule, TAKE 1 CAPSULE BY MOUTH TWICE A DAY AS NEEDED FOR COUGH., Disp: 30 capsule, Rfl: 0   busPIRone (BUSPAR) 15 MG tablet, Take 1 tablet (15 mg total) by mouth in the morning., Disp: 90 tablet, Rfl: 0   clotrimazole-betamethasone (LOTRISONE) cream, Apply 1 application topically daily., Disp: 30 g, Rfl: 0   gabapentin (NEURONTIN) 100 MG capsule, Take 2 capsules (200 mg total) by mouth 2 (two) times daily., Disp: 120 capsule, Rfl: 2   hydrochlorothiazide (HYDRODIURIL) 25 MG tablet, TAKE 1 TABLET BY MOUTH ONCE A DAY., Disp: 90 tablet, Rfl: 0   ketoconazole (NIZORAL) 2 % cream, APPLY TO AFFECTED AREAS ONCE DAILY., Disp: 15 g, Rfl: 0   lisinopril (ZESTRIL) 40 MG tablet, TAKE ONE TABLET BY MOUTH ONCE DAILY., Disp: 90 tablet, Rfl: 0   nystatin (MYCOSTATIN/NYSTOP) powder, APPLY TO AFFECTED AREAS THREE TIMES DAILY., Disp: 15 g, Rfl: 0   pantoprazole (PROTONIX) 40 MG tablet, Take 1 tablet (40 mg total) by mouth 2 (two) times daily before a meal., Disp: 60 tablet, Rfl: 11   Plecanatide (TRULANCE) 3 MG TABS, Take 3 mg by mouth  daily., Disp: 90 tablet, Rfl: 3   rOPINIRole (REQUIP) 2 MG tablet, Take 1 tablet (2 mg total) by mouth at bedtime., Disp: 30 tablet, Rfl: 5   terbinafine (LAMISIL) 250 MG tablet, Take 1 tablet (250 mg total) by mouth daily., Disp: 14 tablet, Rfl: 0   Allergies  Allergen Reactions   Amoxicillin Hives   Aspirin     Cannot take due to ulcers   Nsaids     Cannot take due to ulcers     Past Medical History:  Diagnosis Date   Anxiety    Chronic pain    DDD (degenerative disc disease)    GERD (gastroesophageal reflux disease)    HBP (high blood pressure)    Headache    Hernia    Multiple gastric ulcers    Pre-diabetes    Rotator cuff syndrome of left shoulder 10/09/2011   Ulcer disease      Past Surgical History:  Procedure Laterality Date   APPENDECTOMY     BACK SURGERY     x5   BIOPSY  05/04/2021   Procedure: BIOPSY;  Surgeon: [name], DO;  [facility];  Service: Endoscopy;;   BREAST SURGERY     CARPAL TUNNEL RELEASE     bilateral   CHOLECYSTECTOMY     COLONOSCOPY  10/20/2009   HCW:CBJSEGB anal canal/left-side diverticula and multiple polyps (hyperplastic). poor prep compromised exam. Next TCS 09/2014.   COLONOSCOPY WITH PROPOFOL N/A 05/04/2021  Procedure: COLONOSCOPY WITH PROPOFOL;  Surgeon: [name], DO;  [facility];  Service: Endoscopy;  Laterality: N/A;  9:45am   ESOPHAGOGASTRODUODENOSCOPY  03/06/2010   CWC:BJSE quadrant distal esophageal/patent tubular esophagus/small HH, antral erosions and ulcerations. Bx negative for persistent H.pylori   ESOPHAGOGASTRODUODENOSCOPY  10/20/2009   GBT:DVVOH HH/prepyloric antral ulcer s/p bx (+h.pylori), ERE   ESOPHAGOGASTRODUODENOSCOPY (EGD) WITH PROPOFOL N/A 05/04/2021   Procedure: ESOPHAGOGASTRODUODENOSCOPY (EGD) WITH PROPOFOL;  Surgeon: [name], DO;  [facility];  Service: Endoscopy;  Laterality: N/A;   INTRAUTERINE DEVICE INSERTION     LEEP N/A 02/14/2021    Procedure: LOOP ELECTROSURGICAL EXCISION PROCEDURE (LEEP);  Surgeon: Janyth Pupa, DO;  Location: [facility];  Service: Gynecology;  Laterality: N/A;   MASTECTOMY, PARTIAL Left    x 2   MOUTH SURGERY     POLYPECTOMY  05/04/2021   Procedure: POLYPECTOMY;  Surgeon: [name], DO;  [facility];  Service: Endoscopy;;   ROTATOR CUFF REPAIR     right   TUBAL LIGATION      Family History  Problem Relation Age of Onset   Heart disease Other    Arthritis Other    Cancer Other    Asthma Other    Diabetes Other    Heart disease Mother    Diabetes Mother    Cancer Mother    Non-Hodgkin's lymphoma Mother    Hypertension Mother    Arthritis Mother    Lung cancer Father    Neuropathy Father    Hypertension Father     Social History   Tobacco Use   Smoking status: Every Day    Packs/day: 1.00    Types: Cigarettes   Smokeless tobacco: Never  Vaping Use   Vaping Use: Never used  Substance Use Topics   Alcohol use: No   Drug use: Yes    Frequency: 7.0 times per week    Types: Marijuana    Comment: twice daily    ROS   Objective:   Vitals: BP 122/79 (BP Location: Right Arm)    Pulse (!) 111    Temp 97.7 F (36.5 C) (Oral)    Resp 18    Ht 5\' 7"  (1.702 m)    Wt 261 lb (118.4 kg)    SpO2 94%    BMI 40.88 kg/m   Physical Exam Constitutional:      General: She is not in acute distress.    Appearance: Normal appearance. She is well-developed. She is obese. She is not ill-appearing, toxic-appearing or diaphoretic.  HENT:     Head: Normocephalic and atraumatic.     Right Ear: Tympanic membrane, ear canal and external ear normal. No drainage or tenderness. No middle ear effusion. There is no impacted cerumen. Tympanic membrane is not erythematous.     Left Ear: Tympanic membrane, ear canal and external ear normal. No drainage or tenderness.  No middle ear effusion. There is no impacted cerumen. Tympanic membrane is not erythematous.     Nose: Congestion and  rhinorrhea present.     Mouth/Throat:     Mouth: Mucous membranes are moist. No oral lesions.     Pharynx: No pharyngeal swelling, oropharyngeal exudate, posterior oropharyngeal erythema or uvula swelling.     Tonsils: No tonsillar exudate or tonsillar abscesses.  Eyes:     General: No scleral icterus.       Right eye: No discharge.        Left eye: No discharge.  Extraocular Movements: Extraocular movements intact.     Right eye: Normal extraocular motion.     Left eye: Normal extraocular motion.     Conjunctiva/sclera: Conjunctivae normal.  Cardiovascular:     Rate and Rhythm: Normal rate.     Heart sounds: No murmur heard.   No friction rub. No gallop.  Pulmonary:     Effort: Pulmonary effort is normal. No respiratory distress.     Breath sounds: No stridor. No wheezing, rhonchi or rales.     Comments: Decreased lung sounds throughout but no adventitious lung sounds.  Chest:     Chest wall: No tenderness.  Musculoskeletal:     Cervical back: Normal range of motion and neck supple.  Lymphadenopathy:     Cervical: No cervical adenopathy.  Skin:    General: Skin is warm and dry.  Neurological:     General: No focal deficit present.     Mental Status: She is alert and oriented to person, place, and time.  Psychiatric:        Mood and Affect: Mood normal.        Behavior: Behavior normal.        Thought Content: Thought content normal.        Judgment: Judgment normal.    DG Chest 2 View  Result Date: 08/31/2021 CLINICAL DATA:  Chest tightness cough for 5 days. EXAM: CHEST - 2 VIEW COMPARISON:  None. FINDINGS: The heart size and mediastinal contours are within normal limits. Bilateral bronchial thickening concerning for bronchitis or reactive airway disease. No focal consolidation or pleural effusion. Anterior cervical discectomy and fusion hardware. No acute osseous abnormality. IMPRESSION: 1. No focal consolidation or pleural effusion. 2. Bilateral bronchial thickening  concerning for bronchitis or reactive airway disease. Electronically Signed   By: Keane Police D.O.   On: 08/31/2021 09:07     Assessment and Plan :   PDMP not reviewed this encounter.  1. Acute bronchitis, unspecified organism   2. Encounter for screening for COVID-19   3. Acute viral syndrome   4. Chest tightness   5. Shortness of breath   6. Heavy smoker    Recommend an oral prednisone course and albuterol inhaler given her history of smoking, chest symptoms.  COVID-19 testing pending.  Patient would be an excellent candidate for COVID antiviral medication should she test positive.  You supportive care otherwise for viral bronchitis. Counseled patient on potential for adverse effects with medications prescribed/recommended today, ER and return-to-clinic precautions discussed, patient verbalized understanding.    Jaynee Eagles, [place] 08/31/21 [phone]

## 2021-09-01 LAB — BASIC METABOLIC PANEL
BUN/Creatinine Ratio: 25 — ABNORMAL HIGH (ref 9–23)
BUN: 24 mg/dL (ref 6–24)
CO2: 21 mmol/L (ref 20–29)
Calcium: 10 mg/dL (ref 8.7–10.2)
Chloride: 101 mmol/L (ref 96–106)
Creatinine, Ser: 0.96 mg/dL (ref 0.57–1.00)
Glucose: 123 mg/dL — ABNORMAL HIGH (ref 70–99)
Potassium: 3.8 mmol/L (ref 3.5–5.2)
Sodium: 139 mmol/L (ref 134–144)
eGFR: 71 mL/min/{1.73_m2} (ref >59)

## 2021-09-01 LAB — COVID-19, FLU A+B NAA
Influenza A, NAA: NOT DETECTED
Influenza B, NAA: NOT DETECTED
SARS-CoV-2, NAA: NOT DETECTED

## 2021-10-03 ENCOUNTER — Ambulatory Visit: Payer: 59 | Admitting: Gastroenterology

## 2021-10-03 ENCOUNTER — Encounter: Payer: Self-pay | Admitting: Internal Medicine

## 2021-10-03 NOTE — Progress Notes (Deleted)
? ? ? ? ?Primary Care Physician: Lindell Spar, MD ? ?Primary Gastroenterologist:  Garfield Cornea, MD ? ? ?No chief complaint on file. ? ? ?HPI: Brenda Taylor is a 51 y.o. female here for follow-up.  She was seen in the office back in September 2022 with complaints of right upper quadrant pain, due for colonoscopy.  Colonoscopy in 2011 with poor prep, was due to come back in 2016.  Prior history of prepyloric antral ulcer, positive H. pylori in 2011 with follow-up EGD documenting persistent ulcerations but negative for H. pylori.  Ulcers felt to be due to NSAID use at that time. ? ?At time of last visit she continued to use BC powders 2-3 times per week for joint pain. ? ? ?EGD October 2022: ?-Gastritis, nonspecific reactive gastropathy, no specific histopathologic changes, negative for H. 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 ?Colonoscopy October 2022: ?-Nonbleeding internal hemorrhoids ?-6 polyps removed, transverse/descending path with tubular adenoma, sessile serrated polyps without cytologic dysplasia.  Rectal polyp with tubular adenoma, hyperplastic polyp. ?-Repeat colonoscopy in 5 years ?Current Outpatient Medications  ?Medication Sig Dispense Refill  ? albuterol (VENTOLIN HFA) 108 (90 Base) MCG/ACT inhaler Inhale 1-2 puffs into the lungs every 6 (six) hours as needed for wheezing or shortness of breath. 18 g 0  ? ALPRAZolam (XANAX) 0.5 MG tablet Take 1 tablet (0.5 mg total) by mouth at bedtime as needed for anxiety. 30 tablet 2  ? Aspirin-Salicylamide-Caffeine (BC HEADACHE POWDER PO) Take 1 Package by mouth daily as needed (headache/pain).    ? benzonatate (TESSALON) 100 MG capsule Take 1-2 capsules (100-200 mg total) by mouth 3 (three) times daily as needed for cough. 60 capsule 0  ? busPIRone (BUSPAR) 15 MG tablet Take 1 tablet (15 mg total) by mouth in the morning.  90 tablet 0  ? clotrimazole-betamethasone (LOTRISONE) cream Apply 1 application topically daily. 30 g 0  ? gabapentin (NEURONTIN) 100 MG capsule Take 2 capsules (200 mg total) by mouth 2 (two) times daily. 120 capsule 2  ? hydrochlorothiazide (HYDRODIURIL) 25 MG tablet TAKE 1 TABLET BY MOUTH ONCE A DAY. 90 tablet 0  ? ketoconazole (NIZORAL) 2 % cream APPLY TO AFFECTED AREAS ONCE DAILY. 15 g 0  ? levocetirizine (XYZAL) 5 MG tablet Take 1 tablet (5 mg total) by mouth every evening. 90 tablet 0  ? lisinopril (ZESTRIL) 40 MG tablet TAKE ONE TABLET BY MOUTH ONCE DAILY. 90 tablet 0  ? nystatin (MYCOSTATIN/NYSTOP) powder APPLY TO AFFECTED AREAS THREE TIMES DAILY. 15 g 0  ? pantoprazole (PROTONIX) 40 MG tablet Take 1 tablet (40 mg total) by mouth 2 (two) times daily before a meal. 60 tablet 11  ? Plecanatide (TRULANCE) 3 MG TABS Take 3 mg by mouth daily. 90 tablet 3  ? predniSONE (DELTASONE) 50 MG tablet Take 1 tablet (50 mg total) by mouth daily with breakfast. 5 tablet 0  ? promethazine-dextromethorphan (PROMETHAZINE-DM) 6.25-15 MG/5ML syrup Take 5 mLs by mouth at bedtime as needed for cough. 100 mL 0  ? rOPINIRole (REQUIP) 2 MG tablet Take 1 tablet (2 mg total) by mouth at bedtime. 30 tablet 5  ? terbinafine (LAMISIL) 250 MG tablet Take 1 tablet (250 mg total) by mouth daily. 14 tablet 0  ? ?No current facility-administered medications for this visit.  ? ? ?Allergies as of 10/03/2021 - Review Complete 08/31/2021  ?Allergen Reaction Noted  ? Amoxicillin Hives   ?  Aspirin  08/20/2012  ? Nsaids  04/20/2021  ? ? ?ROS: ? ?General: Negative for anorexia, weight loss, fever, chills, fatigue, weakness. ?ENT: Negative for hoarseness, difficulty swallowing , nasal congestion. ?CV: Negative for chest pain, angina, palpitations, dyspnea on exertion, peripheral edema.  ?Respiratory: Negative for dyspnea at rest, dyspnea on exertion, cough, sputum, wheezing.  ?GI: See history of present illness. ?GU:  Negative for dysuria, hematuria,  urinary incontinence, urinary frequency, nocturnal urination.  ?Endo: Negative for unusual weight change.  ?  ?Physical Examination: ? ? There were no vitals taken for this visit. ? ?General: Well-nourished, well-developed in no acute distress.  ?Eyes: No icterus. ?Mouth: Oropharyngeal mucosa moist and pink , no lesions erythema or exudate. ?Lungs: Clear to auscultation bilaterally.  ?Heart: Regular rate and rhythm, no murmurs rubs or gallops.  ?Abdomen: Bowel sounds are normal, nontender, nondistended, no hepatosplenomegaly or masses, no abdominal bruits or hernia , no rebound or guarding.   ?Extremities: No lower extremity edema. No clubbing or deformities. ?Neuro: Alert and oriented x 4   ?Skin: Warm and dry, no jaundice.   ?Psych: Alert and cooperative, normal mood and affect. ? ?Labs:  ?Lab Results  ?Component Value Date  ? CREATININE 0.96 08/31/2021  ? BUN 24 08/31/2021  ? NA 139 08/31/2021  ? K 3.8 08/31/2021  ? CL 101 08/31/2021  ? CO2 21 08/31/2021  ? ?Lab Results  ?Component Value Date  ? ALT 14 05/15/2021  ? AST 16 05/15/2021  ? ALKPHOS 95 05/15/2021  ? BILITOT <0.2 05/15/2021  ? ?Lab Results  ?Component Value Date  ? WBC 11.2 (H) 05/15/2021  ? HGB 11.7 05/15/2021  ? HCT 34.3 05/15/2021  ? MCV 89 05/15/2021  ? PLT 274 05/15/2021  ? ?Lab Results  ?Component Value Date  ? HGBA1C 6.0 (H) 05/15/2021  ? ?  ?Imaging Studies: ?No results found. ? ? ?Assessment: ? ? ? ? ?Plan: ? ?

## 2021-10-10 ENCOUNTER — Other Ambulatory Visit: Payer: Self-pay | Admitting: Internal Medicine

## 2021-10-10 ENCOUNTER — Encounter: Payer: Self-pay | Admitting: Internal Medicine

## 2021-10-10 ENCOUNTER — Ambulatory Visit (INDEPENDENT_AMBULATORY_CARE_PROVIDER_SITE_OTHER): Payer: 59 | Admitting: Internal Medicine

## 2021-10-10 ENCOUNTER — Other Ambulatory Visit: Payer: Self-pay

## 2021-10-10 VITALS — BP 116/76 | HR 99 | Temp 97.8°F | Ht 67.0 in | Wt 273.6 lb

## 2021-10-10 DIAGNOSIS — K219 Gastro-esophageal reflux disease without esophagitis: Secondary | ICD-10-CM

## 2021-10-10 DIAGNOSIS — F419 Anxiety disorder, unspecified: Secondary | ICD-10-CM

## 2021-10-10 DIAGNOSIS — R1011 Right upper quadrant pain: Secondary | ICD-10-CM

## 2021-10-10 DIAGNOSIS — I10 Essential (primary) hypertension: Secondary | ICD-10-CM

## 2021-10-10 MED ORDER — ALPRAZOLAM 0.5 MG PO TABS: 0.5000 mg | ORAL_TABLET | Freq: Every evening | ORAL | 3 refills | Status: DC | PRN

## 2021-10-10 NOTE — Progress Notes (Signed)
? ? ?Primary Care Physician:  Lindell Spar, MD ?Primary Gastroenterologist:  Dr. Gala Romney ? ?Pre-Procedure History & Physical: ?HPI:  Brenda Taylor is a 51 y.o. female here for follow-up of constipation and GERD.  GERD well-controlled on pantoprazole 40 mg twice daily. ? ?She is disappointed that Trulance, although it is working, does not do as well as Linzess.  Insurance company will not provide her with a reasonable benefit for Linzess.  May go to 3 days without a good bowel movement.  Feels bloated uncomfortable right-sided discomfort.  Gallbladder and appendix out.  Recent EGD and colonoscopy as chronicled in the medical record; due for surveillance colonoscopy 2027 (history of colonic adenoma.  Has never tried MiraLAX. ? ?Past Medical History:  ?Diagnosis Date  ? Anxiety   ? Chronic pain   ? DDD (degenerative disc disease)   ? GERD (gastroesophageal reflux disease)   ? HBP (high blood pressure)   ? Headache   ? Hernia   ? Multiple gastric ulcers   ? Pre-diabetes   ? Rotator cuff syndrome of left shoulder 10/09/2011  ? Ulcer disease   ? ? ?Past Surgical History:  ?Procedure Laterality Date  ? APPENDECTOMY    ? BACK SURGERY    ? x5  ? BIOPSY  05/04/2021  ? Procedure: BIOPSY;  Surgeon: [name], DO;  [facility]: [facility];  Service: Endoscopy;;  ? BREAST SURGERY    ? CARPAL TUNNEL RELEASE    ? bilateral  ? CHOLECYSTECTOMY    ? COLONOSCOPY  10/20/2009  ? [id number] anal canal/left-side diverticula and multiple polyps (hyperplastic). poor prep compromised exam. Next TCS 09/2014.  ? COLONOSCOPY WITH PROPOFOL N/A 05/04/2021  ? Procedure: COLONOSCOPY WITH PROPOFOL;  Surgeon: [name], DO;  Location: [facility];  Service: Endoscopy;  Laterality: N/A;  9:45am  ? ESOPHAGOGASTRODUODENOSCOPY  03/06/2010  ? DXI:[id number] quadrant distal esophageal/patent tubular esophagus/small HH, antral erosions and ulcerations. Bx negative for persistent H.pylori  ? ESOPHAGOGASTRODUODENOSCOPY  10/20/2009  ? [id number]  HH/prepyloric antral ulcer s/p bx (+h.pylori), ERE  ? ESOPHAGOGASTRODUODENOSCOPY (EGD) WITH PROPOFOL N/A 05/04/2021  ? Procedure: ESOPHAGOGASTRODUODENOSCOPY (EGD) WITH PROPOFOL;  Surgeon: [name], DO;  [facility]: [facility];  Service: Endoscopy;  Laterality: N/A;  ? INTRAUTERINE DEVICE INSERTION    ? LEEP N/A 02/14/2021  ? Procedure: LOOP ELECTROSURGICAL EXCISION PROCEDURE (LEEP);  Surgeon: Janyth Pupa, DO;  Location: [facility];  Service: Gynecology;  Laterality: N/A;  ? MASTECTOMY, PARTIAL Left   ? x 2  ? MOUTH SURGERY    ? POLYPECTOMY  05/04/2021  ? Procedure: POLYPECTOMY;  Surgeon: [name], DO;  Location: [facility];  Service: Endoscopy;;  ? ROTATOR CUFF REPAIR    ? right  ? TUBAL LIGATION    ? ? ?Prior to Admission medications   ?Medication Sig Start Date End Date Taking? Authorizing Provider  ?albuterol (VENTOLIN HFA) 108 (90 Base) MCG/ACT inhaler Inhale 1-2 puffs into the lungs every 6 (six) hours as needed for wheezing or shortness of breath. 08/31/21  Yes Jaynee Eagles, PA-C  ?benzonatate (TESSALON) 100 MG capsule Take 1-2 capsules (100-200 mg total) by mouth 3 (three) times daily as needed for cough. 08/31/21  Yes Jaynee Eagles, PA-C  ?clotrimazole-betamethasone (LOTRISONE) cream Apply 1 application topically daily. 05/30/21  Yes Lindell Spar, MD  ?gabapentin (NEURONTIN) 100 MG capsule Take 2 capsules (200 mg total) by mouth 2 (two) times daily. 05/15/21  Yes Lindell Spar, MD  ?hydrochlorothiazide (HYDRODIURIL) 25 MG  tablet TAKE 1 TABLET BY MOUTH ONCE A DAY. 07/11/21  Yes Lindell Spar, MD  ?levocetirizine (XYZAL) 5 MG tablet Take 1 tablet (5 mg total) by mouth every evening. 08/31/21  Yes Jaynee Eagles, PA-C  ?pantoprazole (PROTONIX) 40 MG tablet Take 1 tablet (40 mg total) by mouth 2 (two) times daily before a meal. 05/04/21 05/04/22 Yes [name], DO  ?Plecanatide (TRULANCE) 3 MG TABS Take 3 mg by mouth daily. 08/01/21  Yes Mahala Menghini, PA-C  ?promethazine-dextromethorphan  (PROMETHAZINE-DM) 6.25-15 MG/5ML syrup Take 5 mLs by mouth at bedtime as needed for cough. 08/31/21  Yes Jaynee Eagles, PA-C  ?terbinafine (LAMISIL) 250 MG tablet Take 1 tablet (250 mg total) by mouth daily. 07/18/21  Yes Lindell Spar, MD  ?ALPRAZolam Duanne Moron) 0.5 MG tablet TAKE ONETABLET BY MOUTH AT BEDTIME AS NEEDED FOR ANXIETY. 10/10/21   Lindell Spar, MD  ?Aspirin-Salicylamide-Caffeine [facility] HEADACHE POWDER PO) Take 1 Package by mouth daily as needed (headache/pain). ?Patient not taking: Reported on 10/10/2021    [provider]  ?busPIRone (BUSPAR) 15 MG tablet TAKE 1 TABLET BY MOUTH ONCE DAILY. 10/10/21   Lindell Spar, MD  ?ketoconazole (NIZORAL) 2 % cream APPLY TO AFFECTED AREAS ONCE DAILY. ?Patient not taking: Reported on 10/10/2021 06/19/21   Lindell Spar, MD  ?lisinopril (ZESTRIL) 40 MG tablet TAKE ONE TABLET BY MOUTH ONCE DAILY. 10/10/21   Lindell Spar, MD  ?nystatin (MYCOSTATIN/NYSTOP) powder APPLY TO AFFECTED AREAS THREE TIMES DAILY. ?Patient not taking: Reported on 10/10/2021 06/20/21   Lindell Spar, MD  ?predniSONE (DELTASONE) 50 MG tablet Take 1 tablet (50 mg total) by mouth daily with breakfast. ?Patient not taking: Reported on 10/10/2021 08/31/21   Jaynee Eagles, PA-C  ?rOPINIRole (REQUIP) 2 MG tablet Take 1 tablet (2 mg total) by mouth at bedtime. ?Patient not taking: Reported on 10/10/2021 07/19/21   Lindell Spar, MD  ? ? ?Allergies as of 10/10/2021 - Review Complete 10/10/2021  ?Allergen Reaction Noted  ? Amoxicillin Hives   ? Aspirin  08/20/2012  ? Nsaids  04/20/2021  ? ? ?Family History  ?Problem Relation Age of Onset  ? Heart disease Other   ? Arthritis Other   ? Cancer Other   ? Asthma Other   ? Diabetes Other   ? Heart disease Mother   ? Diabetes Mother   ? Cancer Mother   ? Non-Hodgkin's lymphoma Mother   ? Hypertension Mother   ? Arthritis Mother   ? Lung cancer Father   ? Neuropathy Father   ? Hypertension Father   ? ? ?Social History  ? ?Socioeconomic History  ? Marital  status: Widowed  ?  Spouse name: Not on file  ? Number of children: Not on file  ? Years of education: 41  ? Highest education level: Not on file  ?Occupational History  ? Occupation: Scientist, water quality  ?Tobacco Use  ? Smoking status: Every Day  ?  Packs/day: 0.50  ?  Types: Cigarettes  ? Smokeless tobacco: Never  ?Vaping Use  ? Vaping Use: Never used  ?Substance and Sexual Activity  ? Alcohol use: No  ? Drug use: Yes  ?  Frequency: 7.0 times per week  ?  Types: Marijuana  ?  Comment: twice daily  ? Sexual activity: Yes  ?  Birth control/protection: I.U.D.  ?Other Topics Concern  ? Not on file  ?Social History Narrative  ? Not on file  ? ?Social Determinants of Health  ? ?Financial  Resource Strain: Medium Risk  ? Difficulty of Paying Living Expenses: Somewhat hard  ?Food Insecurity: No Food Insecurity  ? Worried About Charity fundraiser in the Last Year: Never true  ? Ran Out of Food in the Last Year: Never true  ?Transportation Needs: No Transportation Needs  ? Lack of Transportation (Medical): No  ? Lack of Transportation (Non-Medical): No  ?Physical Activity: Inactive  ? Days of Exercise per Week: 0 days  ? Minutes of Exercise per Session: 0 min  ?Stress: Stress Concern Present  ? Feeling of Stress : To some extent  ?Social Connections: Socially Isolated  ? Frequency of Communication with Friends and Family: Three times a week  ? Frequency of Social Gatherings with Friends and Family: Once a week  ? Attends Religious Services: Never  ? Active Member of Clubs or Organizations: Not on file  ? Attends Archivist Meetings: Never  ? Marital Status: Widowed  ?Intimate Partner Violence: Not At Risk  ? Fear of Current or Ex-Partner: No  ? Emotionally Abused: No  ? Physically Abused: No  ? Sexually Abused: No  ? ? ?Review of Systems: ?See HPI, otherwise negative ROS ? ?Physical Exam: ?BP 116/76 (BP Location: Left Arm, Patient Position: Sitting, Cuff Size: Large)   Pulse 99   Temp 97.8 ?F (36.6 ?C) (Temporal)   Ht '5\' 7"'$   (1.702 m)   Wt 273 lb 9.6 oz (124.1 kg)   SpO2 96%   BMI 42.85 kg/m?  ?General:   Alert,  Well-developed, well-nourished, pleasant and cooperative in NAD ?Mouth:  No deformity or lesions. ?Neck:  Supple; no ma

## 2021-10-23 ENCOUNTER — Other Ambulatory Visit: Payer: Self-pay | Admitting: Internal Medicine

## 2021-11-13 ENCOUNTER — Other Ambulatory Visit: Payer: Self-pay | Admitting: Internal Medicine

## 2021-11-13 DIAGNOSIS — G609 Hereditary and idiopathic neuropathy, unspecified: Secondary | ICD-10-CM

## 2021-11-14 ENCOUNTER — Ambulatory Visit: Payer: 59 | Admitting: Internal Medicine

## 2021-11-15 ENCOUNTER — Ambulatory Visit: Payer: 59 | Admitting: Internal Medicine

## 2021-11-28 ENCOUNTER — Encounter: Payer: Self-pay | Admitting: Internal Medicine

## 2021-11-28 ENCOUNTER — Ambulatory Visit (INDEPENDENT_AMBULATORY_CARE_PROVIDER_SITE_OTHER): Payer: 59 | Admitting: Internal Medicine

## 2021-11-28 VITALS — BP 96/70 | HR 98 | Temp 97.3°F | Ht 66.0 in | Wt 275.8 lb

## 2021-11-28 DIAGNOSIS — K5909 Other constipation: Secondary | ICD-10-CM | POA: Diagnosis not present

## 2021-11-28 DIAGNOSIS — K219 Gastro-esophageal reflux disease without esophagitis: Secondary | ICD-10-CM | POA: Diagnosis not present

## 2021-11-28 NOTE — Patient Instructions (Signed)
It was good to see you again today!  May decrease Protonix to 40 mg once daily 30 minutes before a meal ? ?Try senna or similar over-the-counter laxative to see how this works for you without Trulance. ? ?May take Trulance and an over-the-counter laxative together every day in combination if that works best ? ?Daily, weight loss would help many of your health problems ? ?Follow-up with Dr. Posey Pronto as instructed ? ?Office visit here in 3 months ?

## 2021-11-28 NOTE — Progress Notes (Signed)
done

## 2021-11-28 NOTE — Progress Notes (Signed)
? ? ?Primary Care Physician:  Lindell Spar, MD ?Primary Gastroenterologist:  Dr. Gala Romney ? ?Pre-Procedure History & Physical: ?HPI:  Brenda Taylor is a 51 y.o. female here for GERD and constipation.  Linzess worked best.  Mellody Memos 3 mg daily does not always give for the sensation she has had a good bowel movement.   ?She is not bleeding.  No dysphagia.  Did not think MiraLAX did anything.  Concerned about having diabetes.  She is significantly obese. ?Past Medical History:  ?Diagnosis Date  ? Anxiety   ? Chronic pain   ? DDD (degenerative disc disease)   ? GERD (gastroesophageal reflux disease)   ? HBP (high blood pressure)   ? Headache   ? Hernia   ? Multiple gastric ulcers   ? Pre-diabetes   ? Rotator cuff syndrome of left shoulder 10/09/2011  ? Ulcer disease   ? ? ?Past Surgical History:  ?Procedure Laterality Date  ? APPENDECTOMY    ? BACK SURGERY    ? x5  ? BIOPSY  05/04/2021  ? Procedure: BIOPSY;  Surgeon: [name], DO;  [facility]: [facility];  Service: Endoscopy;;  ? BREAST SURGERY    ? CARPAL TUNNEL RELEASE    ? bilateral  ? CHOLECYSTECTOMY    ? COLONOSCOPY  10/20/2009  ? [id number] anal canal/left-side diverticula and multiple polyps (hyperplastic). poor prep compromised exam. Next TCS 09/2014.  ? COLONOSCOPY WITH PROPOFOL N/A 05/04/2021  ? Procedure: COLONOSCOPY WITH PROPOFOL;  Surgeon: [name], DO;  [facility];  Service: Endoscopy;  Laterality: N/A;  9:45am  ? ESOPHAGOGASTRODUODENOSCOPY  03/06/2010  ? FWY:OVZC quadrant distal esophageal/patent tubular esophagus/small HH, antral erosions and ulcerations. Bx negative for persistent H.pylori  ? ESOPHAGOGASTRODUODENOSCOPY  10/20/2009  ? HYI:[id number] HH/prepyloric antral ulcer s/p bx (+h.pylori), ERE  ? ESOPHAGOGASTRODUODENOSCOPY (EGD) WITH PROPOFOL N/A 05/04/2021  ? Procedure: ESOPHAGOGASTRODUODENOSCOPY (EGD) WITH PROPOFOL;  Surgeon: [name], DO;  [facility];  Service: Endoscopy;  Laterality: N/A;  ?  INTRAUTERINE DEVICE INSERTION    ? LEEP N/A 02/14/2021  ? Procedure: LOOP ELECTROSURGICAL EXCISION PROCEDURE (LEEP);  Surgeon: Janyth Pupa, DO;  Location: [facility];  Service: Gynecology;  Laterality: N/A;  ? MASTECTOMY, PARTIAL Left   ? x 2  ? MOUTH SURGERY    ? POLYPECTOMY  05/04/2021  ? Procedure: POLYPECTOMY;  Surgeon: [name], DO;  [facility];  Service: Endoscopy;;  ? ROTATOR CUFF REPAIR    ? right  ? TUBAL LIGATION    ? ? ?Prior to Admission medications   ?Medication Sig Start Date End Date Taking? Authorizing Provider  ?albuterol (VENTOLIN HFA) 108 (90 Base) MCG/ACT inhaler Inhale 1-2 puffs into the lungs every 6 (six) hours as needed for wheezing or shortness of breath. 08/31/21  Yes Jaynee Eagles, PA-C  ?ALPRAZolam (XANAX) 0.5 MG tablet Take 1 tablet (0.5 mg total) by mouth at bedtime as needed for anxiety. 10/10/21  Yes Lindell Spar, MD  ?benzonatate (TESSALON) 100 MG capsule TAKE 1-2 CAPSULES BY MOUTH 3 TIMES DAILY AS NEEDED FOR COUGH. 10/23/21  Yes Lindell Spar, MD  ?busPIRone (BUSPAR) 15 MG tablet TAKE 1 TABLET BY MOUTH ONCE DAILY. 10/10/21  Yes Lindell Spar, MD  ?clotrimazole-betamethasone (LOTRISONE) cream Apply 1 application topically daily. 05/30/21  Yes Lindell Spar, MD  ?gabapentin (NEURONTIN) 100 MG capsule TAKE 2 CAPSULE BY MOUTH 2 TIMES DAILY. 11/13/21  Yes Lindell Spar, MD  ?hydrochlorothiazide (HYDRODIURIL) 25 MG  tablet TAKE 1 TABLET BY MOUTH ONCE A DAY. 07/11/21  Yes Lindell Spar, MD  ?ketoconazole (NIZORAL) 2 % cream APPLY TO AFFECTED AREAS ONCE DAILY. 06/19/21  Yes Lindell Spar, MD  ?levocetirizine (XYZAL) 5 MG tablet Take 1 tablet (5 mg total) by mouth every evening. 08/31/21  Yes Jaynee Eagles, PA-C  ?lisinopril (ZESTRIL) 40 MG tablet TAKE ONE TABLET BY MOUTH ONCE DAILY. 10/10/21  Yes Lindell Spar, MD  ?nystatin (MYCOSTATIN/NYSTOP) powder APPLY TO AFFECTED AREAS THREE TIMES DAILY. 06/20/21  Yes Lindell Spar, MD  ?pantoprazole (PROTONIX) 40 MG tablet  Take 1 tablet (40 mg total) by mouth 2 (two) times daily before a meal. 05/04/21 05/04/22 Yes [name], DO  ?Plecanatide (TRULANCE) 3 MG TABS Take 3 mg by mouth daily. 08/01/21  Yes Mahala Menghini, PA-C  ?promethazine-dextromethorphan (PROMETHAZINE-DM) 6.25-15 MG/5ML syrup Take 5 mLs by mouth at bedtime as needed for cough. 08/31/21  Yes Jaynee Eagles, PA-C  ? ? ?Allergies as of 11/28/2021 - Review Complete 11/28/2021  ?Allergen Reaction Noted  ? Amoxicillin Hives   ? Aspirin  08/20/2012  ? Nsaids  04/20/2021  ? ? ?Family History  ?Problem Relation Age of Onset  ? Heart disease Other   ? Arthritis Other   ? Cancer Other   ? Asthma Other   ? Diabetes Other   ? Heart disease Mother   ? Diabetes Mother   ? Cancer Mother   ? Non-Hodgkin's lymphoma Mother   ? Hypertension Mother   ? Arthritis Mother   ? Lung cancer Father   ? Neuropathy Father   ? Hypertension Father   ? ? ?Social History  ? ?Socioeconomic History  ? Marital status: Widowed  ?  Spouse name: Not on file  ? Number of children: Not on file  ? Years of education: 74  ? Highest education level: Not on file  ?Occupational History  ? Occupation: Scientist, water quality  ?Tobacco Use  ? Smoking status: Every Day  ?  Packs/day: 0.50  ?  Types: Cigarettes  ? Smokeless tobacco: Never  ?Vaping Use  ? Vaping Use: Never used  ?Substance and Sexual Activity  ? Alcohol use: No  ? Drug use: Yes  ?  Frequency: 7.0 times per week  ?  Types: Marijuana  ?  Comment: twice daily  ? Sexual activity: Yes  ?  Birth control/protection: I.U.D.  ?Other Topics Concern  ? Not on file  ?Social History Narrative  ? Not on file  ? ?Social Determinants of Health  ? ?Financial Resource Strain: Medium Risk  ? Difficulty of Paying Living Expenses: Somewhat hard  ?Food Insecurity: No Food Insecurity  ? Worried About Charity fundraiser in the Last Year: Never true  ? Ran Out of Food in the Last Year: Never true  ?Transportation Needs: No Transportation Needs  ? Lack of Transportation (Medical): No  ?  Lack of Transportation (Non-Medical): No  ?Physical Activity: Inactive  ? Days of Exercise per Week: 0 days  ? Minutes of Exercise per Session: 0 min  ?Stress: Stress Concern Present  ? Feeling of Stress : To some extent  ?Social Connections: Socially Isolated  ? Frequency of Communication with Friends and Family: Three times a week  ? Frequency of Social Gatherings with Friends and Family: Once a week  ? Attends Religious Services: Never  ? Active Member of Clubs or Organizations: Not on file  ? Attends Archivist Meetings: Never  ? Marital Status: Widowed  ?  Intimate Partner Violence: Not At Risk  ? Fear of Current or Ex-Partner: No  ? Emotionally Abused: No  ? Physically Abused: No  ? Sexually Abused: No  ? ? ?Review of Systems: ?See HPI, otherwise negative ROS ? ?Physical Exam: ?BP 96/70 (BP Location: Left Arm, Patient Position: Sitting, Cuff Size: Large)   Pulse 98   Temp (!) 97.3 ?F (36.3 ?C) (Temporal)   Ht '5\' 6"'$  (1.676 m)   Wt 275 lb 12.8 oz (125.1 kg)   SpO2 97%   BMI 44.52 kg/m?  ?General:   Alert,  Well-developed, well-nourished, pleasant and cooperative in NAD ?Neck:  Supple; no masses or thyromegaly. No significant cervical adenopathy. ?Lungs:  Clear throughout to auscultation.   No wheezes, crackles, or rhonchi. No acute distress. ?Heart:  Regular rate and rhythm; no murmurs, clicks, rubs,  or gallops. ?Abdomen: Obese.  Positive bowel sounds soft nontender without appreciable mass organomegaly  ?pulses:  Normal pulses noted. ?Extremities:  Without clubbing or edema. ? ?Impression/Plan: Morbidly obese 51 year old lady with GERD and constipation.  GERD well controlled on twice daily PPI-we can likely de-escalate therapy ? ?Constipation suboptimally managed with Trulance 3 mg daily. ? ?Recommendations: ? ?Try senna or similar over-the-counter laxative to see how this works for you without Trulance. ? ?May take Trulance and an over-the-counter laxative together every day in combination if  that works best ? ?Daily, weight loss would help many of your health problems ? ?Follow-up with Dr. Posey Pronto as instructed ? ?Office visit here in 3 months ? ?Notice: This dictation was prepared with Dragon dictation alo

## 2021-12-19 ENCOUNTER — Other Ambulatory Visit: Payer: Self-pay | Admitting: Internal Medicine

## 2021-12-19 DIAGNOSIS — I10 Essential (primary) hypertension: Secondary | ICD-10-CM

## 2022-01-17 ENCOUNTER — Other Ambulatory Visit: Payer: Self-pay | Admitting: Internal Medicine

## 2022-01-17 DIAGNOSIS — I10 Essential (primary) hypertension: Secondary | ICD-10-CM

## 2022-01-17 DIAGNOSIS — F419 Anxiety disorder, unspecified: Secondary | ICD-10-CM

## 2022-02-13 ENCOUNTER — Ambulatory Visit: Payer: 59 | Admitting: Internal Medicine

## 2022-02-13 ENCOUNTER — Encounter: Payer: Self-pay | Admitting: Internal Medicine

## 2022-02-24 ENCOUNTER — Other Ambulatory Visit: Payer: Self-pay | Admitting: Internal Medicine

## 2022-02-24 DIAGNOSIS — F419 Anxiety disorder, unspecified: Secondary | ICD-10-CM

## 2022-02-24 DIAGNOSIS — I10 Essential (primary) hypertension: Secondary | ICD-10-CM

## 2022-02-24 DIAGNOSIS — G609 Hereditary and idiopathic neuropathy, unspecified: Secondary | ICD-10-CM

## 2022-02-28 ENCOUNTER — Encounter: Payer: Self-pay | Admitting: Internal Medicine

## 2022-02-28 ENCOUNTER — Ambulatory Visit: Payer: 59 | Admitting: Gastroenterology

## 2022-02-28 ENCOUNTER — Ambulatory Visit (INDEPENDENT_AMBULATORY_CARE_PROVIDER_SITE_OTHER): Payer: 59 | Admitting: Internal Medicine

## 2022-02-28 VITALS — BP 104/72 | HR 112 | Temp 98.3°F | Ht 66.0 in | Wt 268.0 lb

## 2022-02-28 DIAGNOSIS — K581 Irritable bowel syndrome with constipation: Secondary | ICD-10-CM | POA: Diagnosis not present

## 2022-02-28 DIAGNOSIS — D123 Benign neoplasm of transverse colon: Secondary | ICD-10-CM

## 2022-02-28 DIAGNOSIS — K219 Gastro-esophageal reflux disease without esophagitis: Secondary | ICD-10-CM

## 2022-02-28 NOTE — Patient Instructions (Signed)
Happy to hear that you are doing well from a GI standpoint.  Continue on pantoprazole for your chronic reflux.  Continue on Trulance for your chronic constipation.  Let us know if you need refills.  Otherwise follow-up in 6 months.  It was very nice seeing you again today.  Dr. Abbey Chatters

## 2022-02-28 NOTE — Progress Notes (Signed)
Referring Provider: Lindell Spar, MD Primary Care Physician:  Lindell Spar, MD Primary GI:  Dr. Abbey Chatters  Chief Complaint  Patient presents with   Constipation    Follow up on constipation and reflux. Taking trulance daily and constipation is better. Has two stools per day. Takes protonix '40mg'$  bid and reports reflux is better.     HPI:   Brenda Taylor is a 51 y.o. female who presents   Past Medical History:  Diagnosis Date   Anxiety    Chronic pain    DDD (degenerative disc disease)    GERD (gastroesophageal reflux disease)    HBP (high blood pressure)    Headache    Hernia    Multiple gastric ulcers    Pre-diabetes    Rotator cuff syndrome of left shoulder 10/09/2011   Ulcer disease     Past Surgical History:  Procedure Laterality Date   APPENDECTOMY     BACK SURGERY     x5   BIOPSY  05/04/2021   Procedure: BIOPSY;  Surgeon: [name], DO;  Location: [facility];  Service: Endoscopy;;   BREAST SURGERY     CARPAL TUNNEL RELEASE     bilateral   CHOLECYSTECTOMY     COLONOSCOPY  10/20/2009   TMH:[id number] anal canal/left-side diverticula and multiple polyps (hyperplastic). poor prep compromised exam. Next TCS 09/2014.   COLONOSCOPY WITH PROPOFOL N/A 05/04/2021   Procedure: COLONOSCOPY WITH PROPOFOL;  Surgeon: [name], DO;  Location: [facility];  Service: Endoscopy;  Laterality: N/A;  9:45am   ESOPHAGOGASTRODUODENOSCOPY  03/06/2010   LGX:QJJH quadrant distal esophageal/patent tubular esophagus/small HH, antral erosions and ulcerations. Bx negative for persistent H.pylori   ESOPHAGOGASTRODUODENOSCOPY  10/20/2009   ERD:EYCXK HH/prepyloric antral ulcer s/p bx (+h.pylori), ERE   ESOPHAGOGASTRODUODENOSCOPY (EGD) WITH PROPOFOL N/A 05/04/2021   Procedure: ESOPHAGOGASTRODUODENOSCOPY (EGD) WITH PROPOFOL;  Surgeon: [name], DO;  Location: [facility];  Service: Endoscopy;  Laterality: N/A;   INTRAUTERINE DEVICE INSERTION     LEEP N/A  02/14/2021   Procedure: LOOP ELECTROSURGICAL EXCISION PROCEDURE (LEEP);  Surgeon: Janyth Pupa, DO;  Location: [facility];  Service: Gynecology;  Laterality: N/A;   MASTECTOMY, PARTIAL Left    x 2   MOUTH SURGERY     POLYPECTOMY  05/04/2021   Procedure: POLYPECTOMY;  Surgeon: [name], DO;  Location: [facility];  Service: Endoscopy;;   ROTATOR CUFF REPAIR     right   TUBAL LIGATION      Current Outpatient Medications  Medication Sig Dispense Refill   ALPRAZolam (XANAX) 0.5 MG tablet TAKE ONE TABLET BY MOUTH AT BEDTIME AS NEEDED FOR ANXIETY. 30 tablet 0   benzonatate (TESSALON) 100 MG capsule Take 1 capsule (100 mg total) by mouth 2 (two) times daily as needed for cough. 30 capsule 0   busPIRone (BUSPAR) 15 MG tablet TAKE 1 TABLET BY MOUTH ONCE DAILY. 30 tablet 0   gabapentin (NEURONTIN) 100 MG capsule TAKE 2 CAPSULE BY MOUTH 2 TIMES DAILY. 120 capsule 0   hydrochlorothiazide (HYDRODIURIL) 25 MG tablet TAKE 1 TABLET BY MOUTH ONCE A DAY. 90 tablet 0   lisinopril (ZESTRIL) 40 MG tablet TAKE ONE TABLET BY MOUTH ONCE DAILY. 30 tablet 0   pantoprazole (PROTONIX) 40 MG tablet Take 1 tablet (40 mg total) by mouth 2 (two) times daily before a meal. 60 tablet 11   Plecanatide (TRULANCE) 3 MG TABS Take 3 mg by mouth daily. 90 tablet 3  No current facility-administered medications for this visit.    Allergies as of 02/28/2022 - Review Complete 02/28/2022  Allergen Reaction Noted   Amoxicillin Hives    Aspirin  08/20/2012   Nsaids  04/20/2021    Family History  Problem Relation Age of Onset   Heart disease Other    Arthritis Other    Cancer Other    Asthma Other    Diabetes Other    Heart disease Mother    Diabetes Mother    Cancer Mother    Non-Hodgkin's lymphoma Mother    Hypertension Mother    Arthritis Mother    Lung cancer Father    Neuropathy Father    Hypertension Father     Social History   Socioeconomic History   Marital status: Widowed    Spouse name:  Not on file   Number of children: Not on file   Years of education: 9   Highest education level: Not on file  Occupational History   Occupation: Scientist, water quality  Tobacco Use   Smoking status: Every Day    Packs/day: 0.50    Types: Cigarettes    Passive exposure: Current   Smokeless tobacco: Never  Vaping Use   Vaping Use: Never used  Substance and Sexual Activity   Alcohol use: No   Drug use: Yes    Frequency: 7.0 times per week    Types: Marijuana    Comment: twice daily   Sexual activity: Yes    Birth control/protection: I.U.D.  Other Topics Concern   Not on file  Social History Narrative   Not on file   Social Determinants of Health   Financial Resource Strain: Medium Risk (12/26/2020)   Overall Financial Resource Strain (CARDIA)    Difficulty of Paying Living Expenses: Somewhat hard  Food Insecurity: No Food Insecurity (12/26/2020)   Hunger Vital Sign    Worried About Running Out of Food in the Last Year: Never true    Ran Out of Food in the Last Year: Never true  Transportation Needs: No Transportation Needs (12/26/2020)   PRAPARE - Hydrologist (Medical): No    Lack of Transportation (Non-Medical): No  Physical Activity: Inactive (12/26/2020)   Exercise Vital Sign    Days of Exercise per Week: 0 days    Minutes of Exercise per Session: 0 min  Stress: Stress Concern Present (12/26/2020)   [place]    Feeling of Stress : To some extent  Social Connections: Socially Isolated (12/26/2020)   Social Connection and Isolation Panel [NHANES]    Frequency of Communication with Friends and Family: Three times a week    Frequency of Social Gatherings with Friends and Family: Once a week    Attends Religious Services: Never    Marine scientist or Organizations: Not on file    Attends Archivist Meetings: Never    Marital Status: Widowed    Subjective: Review of Systems   Constitutional:  Negative for chills and fever.  HENT:  Negative for congestion and hearing loss.   Eyes:  Negative for blurred vision and double vision.  Respiratory:  Negative for cough and shortness of breath.   Cardiovascular:  Negative for chest pain and palpitations.  Gastrointestinal:  Positive for constipation and heartburn. Negative for abdominal pain, blood in stool, diarrhea, melena and vomiting.  Genitourinary:  Negative for dysuria and urgency.  Musculoskeletal:  Negative for joint pain and myalgias.  Skin:  Negative for itching and rash.  Neurological:  Negative for dizziness and headaches.  Psychiatric/Behavioral:  Negative for depression. The patient is not nervous/anxious.      Objective: BP 104/72 (BP Location: Left Arm, Patient Position: Sitting, Cuff Size: Large)   Pulse (!) 112   Temp 98.3 F (36.8 C) (Oral)   Ht '5\' 6"'$  (1.676 m)   Wt 268 lb (121.6 kg)   BMI 43.26 kg/m  Physical Exam Constitutional:      Appearance: Normal appearance.  HENT:     Head: Normocephalic and atraumatic.  Eyes:     Extraocular Movements: Extraocular movements intact.     Conjunctiva/sclera: Conjunctivae normal.  Cardiovascular:     Rate and Rhythm: Normal rate and regular rhythm.  Pulmonary:     Effort: Pulmonary effort is normal.     Breath sounds: Normal breath sounds.  Abdominal:     General: Bowel sounds are normal.     Palpations: Abdomen is soft.  Musculoskeletal:        General: No swelling. Normal range of motion.     Cervical back: Normal range of motion and neck supple.  Skin:    General: Skin is warm and dry.     Coloration: Skin is not jaundiced.  Neurological:     General: No focal deficit present.     Mental Status: She is alert and oriented to person, place, and time.  Psychiatric:        Mood and Affect: Mood normal.        Behavior: Behavior normal.      Assessment: *  Plan:   02/28/2022 3:28 PM   Disclaimer: This note was dictated with  voice recognition software. Similar sounding words can inadvertently be transcribed and may not be corrected upon review.

## 2022-02-28 NOTE — Progress Notes (Signed)
Referring Provider: Lindell Spar, MD Primary Care Physician:  Lindell Spar, MD Primary GI:  Dr. Abbey Chatters  Chief Complaint  Patient presents with   Constipation    Follow up on constipation and reflux. Taking trulance daily and constipation is better. Has two stools per day. Takes protonix '40mg'$  bid and reports reflux is better.     HPI:   Brenda Taylor is a 51 y.o. female who presents to the clinic today for follow-up visit.  Has history of IBS constipation predominant.  Currently maintained on Trulance 3 mg daily.  States she will have 1-2 bowel movements a day.  States she preferred the [place] though insurance would not cover it.  Overall doing well.  No melena hematochezia.  Also with chronic GERD maintained on pantoprazole daily.  States her symptoms are well-controlled.  No breakthrough symptoms of heartburn or acid reflux.  No dysphagia odynophagia.  No epigastric or chest pain.  EGD 05/04/2021 showed gastritis, biopsies negative for H. pylori.  Duodenitis.  Colonoscopy 05/04/2021 with multiple tubular adenomas, 1 sessile serrated polyp, 1 hyperplastic polyps.  Recommended 5-year recall.  Past Medical History:  Diagnosis Date   Anxiety    Chronic pain    DDD (degenerative disc disease)    GERD (gastroesophageal reflux disease)    HBP (high blood pressure)    Headache    Hernia    Multiple gastric ulcers    Pre-diabetes    Rotator cuff syndrome of left shoulder 10/09/2011   Ulcer disease     Past Surgical History:  Procedure Laterality Date   APPENDECTOMY     BACK SURGERY     x5   BIOPSY  05/04/2021   Procedure: BIOPSY;  Surgeon: [name], DO;  [facility]: [facility];  Service: Endoscopy;;   BREAST SURGERY     CARPAL TUNNEL RELEASE     bilateral   CHOLECYSTECTOMY     COLONOSCOPY  10/20/2009   IRJ:JOACZYS anal canal/left-side diverticula and multiple polyps (hyperplastic). poor prep compromised exam. Next TCS 09/2014.   COLONOSCOPY WITH PROPOFOL  N/A 05/04/2021   Procedure: COLONOSCOPY WITH PROPOFOL;  Surgeon: [name], DO;  Location: [facility];  Service: Endoscopy;  Laterality: N/A;  9:45am   ESOPHAGOGASTRODUODENOSCOPY  03/06/2010   AYT:KZSW quadrant distal esophageal/patent tubular esophagus/small HH, antral erosions and ulcerations. Bx negative for persistent H.pylori   ESOPHAGOGASTRODUODENOSCOPY  10/20/2009   FUX:NATFT HH/prepyloric antral ulcer s/p bx (+h.pylori), ERE   ESOPHAGOGASTRODUODENOSCOPY (EGD) WITH PROPOFOL N/A 05/04/2021   Procedure: ESOPHAGOGASTRODUODENOSCOPY (EGD) WITH PROPOFOL;  Surgeon: [name], DO;  [facility]: [facility];  Service: Endoscopy;  Laterality: N/A;   INTRAUTERINE DEVICE INSERTION     LEEP N/A 02/14/2021   Procedure: LOOP ELECTROSURGICAL EXCISION PROCEDURE (LEEP);  Surgeon: Janyth Pupa, DO;  Location: [facility];  Service: Gynecology;  Laterality: N/A;   MASTECTOMY, PARTIAL Left    x 2   MOUTH SURGERY     POLYPECTOMY  05/04/2021   Procedure: POLYPECTOMY;  Surgeon: [name], DO;  Location: [facility];  Service: Endoscopy;;   ROTATOR CUFF REPAIR     right   TUBAL LIGATION      Current Outpatient Medications  Medication Sig Dispense Refill   ALPRAZolam (XANAX) 0.5 MG tablet TAKE ONE TABLET BY MOUTH AT BEDTIME AS NEEDED FOR ANXIETY. 30 tablet 0   benzonatate (TESSALON) 100 MG capsule Take 1 capsule (100 mg total) by mouth 2 (two) times daily as needed for  cough. 30 capsule 0   busPIRone (BUSPAR) 15 MG tablet TAKE 1 TABLET BY MOUTH ONCE DAILY. 30 tablet 0   gabapentin (NEURONTIN) 100 MG capsule TAKE 2 CAPSULE BY MOUTH 2 TIMES DAILY. 120 capsule 0   hydrochlorothiazide (HYDRODIURIL) 25 MG tablet TAKE 1 TABLET BY MOUTH ONCE A DAY. 90 tablet 0   lisinopril (ZESTRIL) 40 MG tablet TAKE ONE TABLET BY MOUTH ONCE DAILY. 30 tablet 0   pantoprazole (PROTONIX) 40 MG tablet Take 1 tablet (40 mg total) by mouth 2 (two) times daily before a meal. 60 tablet 11   Plecanatide  (TRULANCE) 3 MG TABS Take 3 mg by mouth daily. 90 tablet 3   No current facility-administered medications for this visit.    Allergies as of 02/28/2022 - Review Complete 02/28/2022  Allergen Reaction Noted   Amoxicillin Hives    Aspirin  08/20/2012   Nsaids  04/20/2021    Family History  Problem Relation Age of Onset   Heart disease Other    Arthritis Other    Cancer Other    Asthma Other    Diabetes Other    Heart disease Mother    Diabetes Mother    Cancer Mother    Non-Hodgkin's lymphoma Mother    Hypertension Mother    Arthritis Mother    Lung cancer Father    Neuropathy Father    Hypertension Father     Social History   Socioeconomic History   Marital status: Widowed    Spouse name: Not on file   Number of children: Not on file   Years of education: 9   Highest education level: Not on file  Occupational History   Occupation: Scientist, water quality  Tobacco Use   Smoking status: Every Day    Packs/day: 0.50    Types: Cigarettes    Passive exposure: Current   Smokeless tobacco: Never  Vaping Use   Vaping Use: Never used  Substance and Sexual Activity   Alcohol use: No   Drug use: Yes    Frequency: 7.0 times per week    Types: Marijuana    Comment: twice daily   Sexual activity: Yes    Birth control/protection: I.U.D.  Other Topics Concern   Not on file  Social History Narrative   Not on file   Social Determinants of Health   Financial Resource Strain: Medium Risk (12/26/2020)   Overall Financial Resource Strain (CARDIA)    Difficulty of Paying Living Expenses: Somewhat hard  Food Insecurity: No Food Insecurity (12/26/2020)   Hunger Vital Sign    Worried About Running Out of Food in the Last Year: Never true    Ran Out of Food in the Last Year: Never true  Transportation Needs: No Transportation Needs (12/26/2020)   PRAPARE - Hydrologist (Medical): No    Lack of Transportation (Non-Medical): No  Physical Activity: Inactive (12/26/2020)    Exercise Vital Sign    Days of Exercise per Week: 0 days    Minutes of Exercise per Session: 0 min  Stress: Stress Concern Present (12/26/2020)   [place]    Feeling of Stress : To some extent  Social Connections: Socially Isolated (12/26/2020)   Social Connection and Isolation Panel [NHANES]    Frequency of Communication with Friends and Family: Three times a week    Frequency of Social Gatherings with Friends and Family: Once a week    Attends Religious Services: Never  Active Member of Clubs or Organizations: Not on file    Attends Club or Organization Meetings: Never    Marital Status: Widowed    Subjective: Review of Systems  Constitutional:  Negative for chills and fever.  HENT:  Negative for congestion and hearing loss.   Eyes:  Negative for blurred vision and double vision.  Respiratory:  Negative for cough and shortness of breath.   Cardiovascular:  Negative for chest pain and palpitations.  Gastrointestinal:  Negative for abdominal pain, blood in stool, constipation, diarrhea, heartburn, melena and vomiting.  Genitourinary:  Negative for dysuria and urgency.  Musculoskeletal:  Negative for joint pain and myalgias.  Skin:  Negative for itching and rash.  Neurological:  Negative for dizziness and headaches.  Psychiatric/Behavioral:  Negative for depression. The patient is not nervous/anxious.      Objective: BP 104/72 (BP Location: Left Arm, Patient Position: Sitting, Cuff Size: Large)   Pulse (!) 112   Temp 98.3 F (36.8 C) (Oral)   Ht '5\' 6"'$  (1.676 m)   Wt 268 lb (121.6 kg)   BMI 43.26 kg/m  Physical Exam Constitutional:      Appearance: Normal appearance. She is obese.  HENT:     Head: Normocephalic and atraumatic.  Eyes:     Extraocular Movements: Extraocular movements intact.     Conjunctiva/sclera: Conjunctivae normal.  Cardiovascular:     Rate and Rhythm: Normal rate and regular  rhythm.  Pulmonary:     Effort: Pulmonary effort is normal.     Breath sounds: Normal breath sounds.  Abdominal:     General: Bowel sounds are normal.     Palpations: Abdomen is soft.  Musculoskeletal:        General: No swelling. Normal range of motion.     Cervical back: Normal range of motion and neck supple.  Skin:    General: Skin is warm and dry.     Coloration: Skin is not jaundiced.  Neurological:     General: No focal deficit present.     Mental Status: She is alert and oriented to person, place, and time.  Psychiatric:        Mood and Affect: Mood normal.        Behavior: Behavior normal.      Assessment: *Irritable bowel syndrome-constipation predominant *Chronic GERD *Adenomatous colon polyps  Plan: IBS constipation predominant improved on Trulance.  Linzess works slightly better though unable to afford given insurance issues.  Symptoms relatively well-controlled on Trulance.  Will continue.  Chronic GERD well-controlled on pantoprazole.  Will continue.  Colonoscopy recall 2027.  Follow-up in 6 months.  02/28/2022 3:37 PM   Disclaimer: This note was dictated with voice recognition software. Similar sounding words can inadvertently be transcribed and may not be corrected upon review.

## 2022-03-06 ENCOUNTER — Ambulatory Visit (INDEPENDENT_AMBULATORY_CARE_PROVIDER_SITE_OTHER): Payer: 59 | Admitting: Internal Medicine

## 2022-03-06 ENCOUNTER — Encounter: Payer: Self-pay | Admitting: Internal Medicine

## 2022-03-06 VITALS — BP 118/76 | HR 102 | Resp 18 | Ht 65.0 in | Wt 270.4 lb

## 2022-03-06 DIAGNOSIS — K5904 Chronic idiopathic constipation: Secondary | ICD-10-CM

## 2022-03-06 DIAGNOSIS — Z1231 Encounter for screening mammogram for malignant neoplasm of breast: Secondary | ICD-10-CM

## 2022-03-06 DIAGNOSIS — G609 Hereditary and idiopathic neuropathy, unspecified: Secondary | ICD-10-CM | POA: Diagnosis not present

## 2022-03-06 DIAGNOSIS — M19042 Primary osteoarthritis, left hand: Secondary | ICD-10-CM

## 2022-03-06 DIAGNOSIS — M19041 Primary osteoarthritis, right hand: Secondary | ICD-10-CM | POA: Insufficient documentation

## 2022-03-06 DIAGNOSIS — F411 Generalized anxiety disorder: Secondary | ICD-10-CM | POA: Diagnosis not present

## 2022-03-06 DIAGNOSIS — Z79899 Other long term (current) drug therapy: Secondary | ICD-10-CM

## 2022-03-06 DIAGNOSIS — I10 Essential (primary) hypertension: Secondary | ICD-10-CM | POA: Diagnosis not present

## 2022-03-06 DIAGNOSIS — Z1159 Encounter for screening for other viral diseases: Secondary | ICD-10-CM

## 2022-03-06 DIAGNOSIS — R7303 Prediabetes: Secondary | ICD-10-CM

## 2022-03-06 DIAGNOSIS — K219 Gastro-esophageal reflux disease without esophagitis: Secondary | ICD-10-CM

## 2022-03-06 DIAGNOSIS — Z23 Encounter for immunization: Secondary | ICD-10-CM

## 2022-03-06 DIAGNOSIS — E782 Mixed hyperlipidemia: Secondary | ICD-10-CM

## 2022-03-06 DIAGNOSIS — E559 Vitamin D deficiency, unspecified: Secondary | ICD-10-CM

## 2022-03-06 MED ORDER — TRAMADOL HCL 50 MG PO TABS: 50.0000 mg | ORAL_TABLET | Freq: Two times a day (BID) | ORAL | 0 refills | Status: DC | PRN

## 2022-03-06 MED ORDER — GABAPENTIN 300 MG PO CAPS: 300.0000 mg | ORAL_CAPSULE | Freq: Two times a day (BID) | ORAL | 1 refills | Status: DC

## 2022-03-06 NOTE — Progress Notes (Signed)
Established Patient Office Visit  Subjective:  Patient ID: Brenda Taylor, female    DOB: 1971/03/23  Age: 51 y.o. MRN: [id number]  CC:  Chief Complaint  Patient presents with   Follow-up    Follow up left hand/fingers are hurting has been hurting since 02-20-22 also has been having right side cramps comes and goes     HPI THEDORA Taylor is a 51 y.o. female with past medical history of hypertension, anxiety and colonic polyp who presents for f/u of her chronic medical conditions.  HTN: BP is well-controlled. Takes medications regularly. Patient denies headache, dizziness, chest pain, dyspnea or palpitations.  Left hand pain: She complains of acute on chronic left hand pain, worse in the MCP joints of index and middle fingers.  She has tried Tylenol, ibuprofen, Goody powder and other OTC NSAIDs with minimal relief.  She denies any recent injury.  She was told of OA of hand by rheumatologist.  Of note, she has history of GERD and was told to avoid oral NSAIDs by her GI.  She has been taking gabapentin 200 mg BID. She still c/o b/l leg pain, which is worse at nighttime. Her pain gets slightly better with walking. She also reports leg cramps at nighttime. Denies any fall or recent injury.  Anxiety: Now well-controlled with Xanax. Denies any anhedonia, SI or HI.   Past Medical History:  Diagnosis Date   Anxiety    Chronic pain    DDD (degenerative disc disease)    GERD (gastroesophageal reflux disease)    HBP (high blood pressure)    Headache    Hernia    Multiple gastric ulcers    Pre-diabetes    Rotator cuff syndrome of left shoulder 10/09/2011   Ulcer disease     Past Surgical History:  Procedure Laterality Date   APPENDECTOMY     BACK SURGERY     x5   BIOPSY  05/04/2021   Procedure: BIOPSY;  Surgeon: Brenda Harman, DO;  [facility]: [facility];  Service: Endoscopy;;   BREAST SURGERY     CARPAL TUNNEL RELEASE     bilateral   CHOLECYSTECTOMY     COLONOSCOPY  10/20/2009    CMK:LKJZPHX anal canal/left-side diverticula and multiple polyps (hyperplastic). poor prep compromised exam. Next TCS 09/2014.   COLONOSCOPY WITH PROPOFOL N/A 05/04/2021   Procedure: COLONOSCOPY WITH PROPOFOL;  Surgeon: Brenda Harman, DO;  [facility]: [facility];  Service: Endoscopy;  Laterality: N/A;  9:45am   ESOPHAGOGASTRODUODENOSCOPY  03/06/2010   TAV:WPVX quadrant distal esophageal/patent tubular esophagus/small HH, antral erosions and ulcerations. Bx negative for persistent H.pylori   ESOPHAGOGASTRODUODENOSCOPY  10/20/2009   YIA:XKPVV HH/prepyloric antral ulcer s/p bx (+h.pylori), ERE   ESOPHAGOGASTRODUODENOSCOPY (EGD) WITH PROPOFOL N/A 05/04/2021   Procedure: ESOPHAGOGASTRODUODENOSCOPY (EGD) WITH PROPOFOL;  Surgeon: Brenda Harman, DO;  [facility]: [facility];  Service: Endoscopy;  Laterality: N/A;   INTRAUTERINE DEVICE INSERTION     LEEP N/A 02/14/2021   Procedure: LOOP ELECTROSURGICAL EXCISION PROCEDURE (LEEP);  Surgeon: Brenda Pupa, DO;  Location: [facility];  Service: Gynecology;  Laterality: N/A;   MASTECTOMY, PARTIAL Left    x 2   MOUTH SURGERY     POLYPECTOMY  05/04/2021   Procedure: POLYPECTOMY;  Surgeon: Brenda Harman, DO;  [facility]: [facility];  Service: Endoscopy;;   ROTATOR CUFF REPAIR     right   TUBAL LIGATION      Family History  Problem Relation Age of Onset  Heart disease Other    Arthritis Other    Cancer Other    Asthma Other    Diabetes Other    Heart disease Mother    Diabetes Mother    Cancer Mother    Non-Hodgkin's lymphoma Mother    Hypertension Mother    Arthritis Mother    Lung cancer Father    Neuropathy Father    Hypertension Father     Social History   Socioeconomic History   Marital status: Widowed    Spouse name: Not on file   Number of children: Not on file   Years of education: 9   Highest education level: Not on file  Occupational History   Occupation: Scientist, water quality  Tobacco Use   Smoking status: Every Day     Packs/day: 0.50    Types: Cigarettes    Passive exposure: Current   Smokeless tobacco: Never  Vaping Use   Vaping Use: Never used  Substance and Sexual Activity   Alcohol use: No   Drug use: Yes    Frequency: 7.0 times per week    Types: Marijuana    Comment: twice daily   Sexual activity: Yes    Birth control/protection: I.U.D.  Other Topics Concern   Not on file  Social History Narrative   Not on file   Social Determinants of Health   Financial Resource Strain: Medium Risk (12/26/2020)   Overall Financial Resource Strain (CARDIA)    Difficulty of Paying Living Expenses: Somewhat hard  Food Insecurity: No Food Insecurity (12/26/2020)   Hunger Vital Sign    Worried About Running Out of Food in the Last Year: Never true    Ran Out of Food in the Last Year: Never true  Transportation Needs: No Transportation Needs (12/26/2020)   PRAPARE - Hydrologist (Medical): No    Lack of Transportation (Non-Medical): No  Physical Activity: Inactive (12/26/2020)   Exercise Vital Sign    Days of Exercise per Week: 0 days    Minutes of Exercise per Session: 0 min  Stress: Stress Concern Present (12/26/2020)   [place]    Feeling of Stress : To some extent  Social Connections: Socially Isolated (12/26/2020)   Social Connection and Isolation Panel [NHANES]    Frequency of Communication with Friends and Family: Three times a week    Frequency of Social Gatherings with Friends and Family: Once a week    Attends Religious Services: Never    Marine scientist or Organizations: Not on file    Attends Archivist Meetings: Never    Marital Status: Widowed  Intimate Partner Violence: Not At Risk (12/26/2020)   Humiliation, Afraid, Rape, and Kick questionnaire    Fear of Current or Ex-Partner: No    Emotionally Abused: No    Physically Abused: No    Sexually Abused: No    Outpatient Medications  Prior to Visit  Medication Sig Dispense Refill   ALPRAZolam (XANAX) 0.5 MG tablet TAKE ONE TABLET BY MOUTH AT BEDTIME AS NEEDED FOR ANXIETY. 30 tablet 0   benzonatate (TESSALON) 100 MG capsule Take 1 capsule (100 mg total) by mouth 2 (two) times daily as needed for cough. 30 capsule 0   busPIRone (BUSPAR) 15 MG tablet TAKE 1 TABLET BY MOUTH ONCE DAILY. 30 tablet 0   hydrochlorothiazide (HYDRODIURIL) 25 MG tablet TAKE 1 TABLET BY MOUTH ONCE A DAY. 90 tablet 0   lisinopril (  ZESTRIL) 40 MG tablet TAKE ONE TABLET BY MOUTH ONCE DAILY. 30 tablet 0   pantoprazole (PROTONIX) 40 MG tablet Take 1 tablet (40 mg total) by mouth 2 (two) times daily before a meal. 60 tablet 11   Plecanatide (TRULANCE) 3 MG TABS Take 3 mg by mouth daily. 90 tablet 3   gabapentin (NEURONTIN) 100 MG capsule TAKE 2 CAPSULE BY MOUTH 2 TIMES DAILY. 120 capsule 0   No facility-administered medications prior to visit.    Allergies  Allergen Reactions   Amoxicillin Hives   Aspirin     Cannot take due to ulcers   Nsaids     Cannot take due to ulcers     ROS Review of Systems  Constitutional:  Negative for chills and fever.  HENT:  Positive for hearing loss (Right sided). Negative for congestion, postnasal drip, sinus pressure, sinus pain and sore throat.   Eyes:  Negative for pain and discharge.  Respiratory:  Negative for cough and shortness of breath.   Cardiovascular:  Negative for chest pain and palpitations.  Gastrointestinal:  Negative for abdominal pain, diarrhea, nausea and vomiting.  Endocrine: Negative for polydipsia and polyuria.  Genitourinary:  Negative for dysuria and hematuria.  Musculoskeletal:  Positive for arthralgias. Negative for neck pain and neck stiffness.  Skin:  Negative for rash.  Neurological:  Positive for numbness. Negative for dizziness, seizures, syncope, speech difficulty and light-headedness.  Psychiatric/Behavioral:  Positive for sleep disturbance. Negative for agitation and behavioral  problems. The patient is nervous/anxious.       Objective:    Physical Exam Vitals reviewed.  Constitutional:      General: She is not in acute distress.    Appearance: She is obese. She is not diaphoretic.  HENT:     Head: Normocephalic and atraumatic.     Nose: Nose normal. No congestion.     Mouth/Throat:     Mouth: Mucous membranes are moist.     Pharynx: No posterior oropharyngeal erythema.  Eyes:     General: No scleral icterus.    Extraocular Movements: Extraocular movements intact.  Neck:     Vascular: No carotid bruit.  Cardiovascular:     Rate and Rhythm: Normal rate and regular rhythm.     Pulses: Normal pulses.     Heart sounds: Normal heart sounds. No murmur heard. Pulmonary:     Breath sounds: Normal breath sounds. No wheezing or rales.  Abdominal:     Palpations: Abdomen is soft.     Tenderness: There is no abdominal tenderness.  Musculoskeletal:        General: Tenderness (MCP joints of index and middle finger of left hand) present.     Cervical back: Neck supple. No tenderness.     Right lower leg: No edema.     Left lower leg: No edema.  Skin:    General: Skin is warm.     Findings: No rash.  Neurological:     General: No focal deficit present.     Mental Status: She is alert and oriented to person, place, and time.     Sensory: No sensory deficit.     Motor: No weakness.  Psychiatric:        Mood and Affect: Mood normal.        Behavior: Behavior normal.     BP 118/76 (BP Location: Right Arm, Patient Position: Sitting, Cuff Size: Normal)   Pulse (!) 102   Resp 18   Ht 5' 5"  (1.651 m)  Wt 270 lb 6.4 oz (122.7 kg)   SpO2 96%   BMI 45.00 kg/m  Wt Readings from Last 3 Encounters:  03/06/22 270 lb 6.4 oz (122.7 kg)  02/28/22 268 lb (121.6 kg)  11/28/21 275 lb 12.8 oz (125.1 kg)    Lab Results  Component Value Date   TSH 1.730 09/14/2020   Lab Results  Component Value Date   WBC 11.2 (H) 05/15/2021   HGB 11.7 05/15/2021   HCT 34.3  05/15/2021   MCV 89 05/15/2021   PLT 274 05/15/2021   Lab Results  Component Value Date   NA 139 08/31/2021   K 3.8 08/31/2021   CO2 21 08/31/2021   GLUCOSE 123 (H) 08/31/2021   BUN 24 08/31/2021   CREATININE 0.96 08/31/2021   BILITOT <0.2 05/15/2021   ALKPHOS 95 05/15/2021   AST 16 05/15/2021   ALT 14 05/15/2021   PROT 7.4 05/15/2021   ALBUMIN 4.3 05/15/2021   CALCIUM 10.0 08/31/2021   ANIONGAP 7 04/25/2021   EGFR 71 08/31/2021   Lab Results  Component Value Date   CHOL 156 05/15/2021   Lab Results  Component Value Date   HDL 31 (L) 05/15/2021   Lab Results  Component Value Date   LDLCALC 105 (H) 05/15/2021   Lab Results  Component Value Date   TRIG 110 05/15/2021   Lab Results  Component Value Date   CHOLHDL 5.0 (H) 05/15/2021   Lab Results  Component Value Date   HGBA1C 6.0 (H) 05/15/2021      Assessment & Plan:   Problem List Items Addressed This Visit       Cardiovascular and Mediastinum   Hypertension    BP: 118/76   Well-controlled with lisinopril and HCTZ Advised to stay compliant with medications Advised DASH diet and moderate exercise as tolerated F/u CMP and lipid profile      Relevant Orders   TSH   CMP14+EGFR     Digestive   Chronic idiopathic constipation    On Trulance      Gastroesophageal reflux disease without esophagitis    On Protonix 40 mg BID, followed by GI        Nervous and Auditory   Idiopathic peripheral neuropathy    Slightly better with Gabapentin 200 mg BID, but still has numbness of legs Also has leg cramps better with walking, concern for RLS - increase dose of Gabapentin to 300 mg BID      Relevant Medications   gabapentin (NEURONTIN) 300 MG capsule   Other Relevant Orders   TSH   CBC with Differential/Platelet     Musculoskeletal and Integument   Primary osteoarthritis of left hand    L > R hand OA Unable to take oral NSAIDs due to GERD Tylenol arthritis as needed for mild-to-moderate  pain Tramadol as needed for severe pain      Relevant Medications   traMADol (ULTRAM) 50 MG tablet     Other   GAD (generalized anxiety disorder) - Primary    Well-controlled with Xanax Check Toxassure      Relevant Orders   TSH   ToxASSURE Select 13 (MW), Urine   Prediabetes   Relevant Orders   Hemoglobin A1c   CMP14+EGFR   Vitamin D deficiency   Relevant Orders   VITAMIN D 25 Hydroxy (Vit-D Deficiency, Fractures)   Mixed hyperlipidemia    Check lipid profile Follow DASH diet If persistently elevated, will start statin      Relevant Orders  Lipid panel   Other Visit Diagnoses     Screening mammogram for breast cancer       Relevant Orders   MM 3D SCREEN BREAST BILATERAL   Chronic prescription benzodiazepine use       Relevant Orders   ToxASSURE Select 13 (MW), Urine   Need for hepatitis C screening test       Relevant Orders   Hepatitis C Antibody   Need for varicella vaccine       Relevant Orders   Zoster Recombinant (Shingrix ) (Completed)       Meds ordered this encounter  Medications   traMADol (ULTRAM) 50 MG tablet    Sig: Take 1 tablet (50 mg total) by mouth every 12 (twelve) hours as needed.    Dispense:  15 tablet    Refill:  0   gabapentin (NEURONTIN) 300 MG capsule    Sig: Take 1 capsule (300 mg total) by mouth 2 (two) times daily.    Dispense:  180 capsule    Refill:  1    Dose change    Follow-up: Return in about 4 months (around 07/06/2022) for Annual physical.    Lindell Spar, MD

## 2022-03-06 NOTE — Assessment & Plan Note (Signed)
Check lipid profile Follow DASH diet If persistently elevated, will start statin 

## 2022-03-06 NOTE — Assessment & Plan Note (Signed)
Slightly better with Gabapentin 200 mg BID, but still has numbness of legs Also has leg cramps better with walking, concern for RLS - increase dose of Gabapentin to 300 mg BID

## 2022-03-06 NOTE — Patient Instructions (Signed)
Please take Tramadol for severe hand pain.  Please continue taking other medications as prescribed.  Please continue to follow low carb diet and ambulate as tolerated.  Please get fasting blood tests done before the next visit.

## 2022-03-06 NOTE — Assessment & Plan Note (Signed)
L > R hand OA Unable to take oral NSAIDs due to GERD Tylenol arthritis as needed for mild-to-moderate pain Tramadol as needed for severe pain

## 2022-03-06 NOTE — Assessment & Plan Note (Signed)
BP: 118/76   Well-controlled with lisinopril and HCTZ Advised to stay compliant with medications Advised DASH diet and moderate exercise as tolerated F/u CMP and lipid profile

## 2022-03-06 NOTE — Assessment & Plan Note (Signed)
On Protonix 40 mg BID, followed by GI

## 2022-03-06 NOTE — Assessment & Plan Note (Signed)
On Trulance

## 2022-03-06 NOTE — Assessment & Plan Note (Signed)
Well-controlled with Xanax Check Toxassure

## 2022-03-10 LAB — TOXASSURE SELECT 13 (MW), URINE

## 2022-03-12 ENCOUNTER — Ambulatory Visit (HOSPITAL_COMMUNITY): Payer: 59

## 2022-03-28 ENCOUNTER — Other Ambulatory Visit: Payer: Self-pay | Admitting: Internal Medicine

## 2022-03-28 DIAGNOSIS — I10 Essential (primary) hypertension: Secondary | ICD-10-CM

## 2022-04-30 ENCOUNTER — Encounter: Payer: Self-pay | Admitting: Internal Medicine

## 2022-04-30 ENCOUNTER — Ambulatory Visit (INDEPENDENT_AMBULATORY_CARE_PROVIDER_SITE_OTHER): Payer: 59 | Admitting: Internal Medicine

## 2022-04-30 VITALS — BP 136/84 | HR 107 | Resp 18

## 2022-04-30 DIAGNOSIS — M19042 Primary osteoarthritis, left hand: Secondary | ICD-10-CM

## 2022-04-30 DIAGNOSIS — M5431 Sciatica, right side: Secondary | ICD-10-CM | POA: Diagnosis not present

## 2022-04-30 DIAGNOSIS — M5430 Sciatica, unspecified side: Secondary | ICD-10-CM | POA: Insufficient documentation

## 2022-04-30 MED ORDER — KETOROLAC TROMETHAMINE 60 MG/2ML IM SOLN
60.0000 mg | Freq: Once | INTRAMUSCULAR | Status: AC
  Administered 2022-04-30: 60 mg via INTRAMUSCULAR

## 2022-04-30 MED ORDER — TRAMADOL HCL 50 MG PO TABS: 50.0000 mg | ORAL_TABLET | Freq: Two times a day (BID) | ORAL | 0 refills | Status: DC | PRN

## 2022-04-30 MED ORDER — PREDNISONE 10 MG (21) PO TBPK
ORAL_TABLET | ORAL | 0 refills | Status: DC
Start: 2022-04-30 — End: 2022-05-14

## 2022-04-30 MED ORDER — CYCLOBENZAPRINE HCL 5 MG PO TABS: 5.0000 mg | ORAL_TABLET | Freq: Two times a day (BID) | ORAL | 0 refills | Status: DC | PRN

## 2022-04-30 NOTE — Assessment & Plan Note (Signed)
Acute on chronic low back pain radiating to RLE Toradol IM today Sterapred taper Tramadol as needed for severe pain Flexeril as needed for muscle spasms Avoid heavy lifting and frequent bending Simple back exercises

## 2022-04-30 NOTE — Progress Notes (Signed)
Acute Office Visit  Subjective:    Patient ID: Brenda Taylor, female    DOB: 31-Aug-1970, 51 y.o.   MRN: [id number]  Chief Complaint  Patient presents with   Knee Pain    Pt having right knee pain since 04-27-22 [facility] on left side has not been able to walk since feels like its pulling in back up to hip     HPI Patient is in today for complaint of right buttock area pain, posterior part of right thigh and right leg pain for the last 4 days.  Pain is constant, spasmodic, worse with movement and slightly better with rest and [place].  She denies any recent injury or fall.  She has had chronic low back pain, but denies any new numbness or tingling of the LE.  Denies any heavy lifting or frequent bending recently.  Denies any saddle anesthesia, urinary or stool incontinence.  Past Medical History:  Diagnosis Date   Anxiety    Chronic pain    DDD (degenerative disc disease)    GERD (gastroesophageal reflux disease)    HBP (high blood pressure)    Headache    Hernia    Multiple gastric ulcers    Pre-diabetes    Rotator cuff syndrome of left shoulder 10/09/2011   Ulcer disease     Past Surgical History:  Procedure Laterality Date   APPENDECTOMY     BACK SURGERY     x5   BIOPSY  05/04/2021   Procedure: BIOPSY;  Surgeon: [name], DO;  [facility];  Service: Endoscopy;;   BREAST SURGERY     CARPAL TUNNEL RELEASE     bilateral   CHOLECYSTECTOMY     COLONOSCOPY  10/20/2009   UVO:[id number] anal canal/left-side diverticula and multiple polyps (hyperplastic). poor prep compromised exam. Next TCS 09/2014.   COLONOSCOPY WITH PROPOFOL N/A 05/04/2021   Procedure: COLONOSCOPY WITH PROPOFOL;  Surgeon: [name], DO;  [facility];  Service: Endoscopy;  Laterality: N/A;  9:45am   ESOPHAGOGASTRODUODENOSCOPY  03/06/2010   HKV:QQVZ quadrant distal esophageal/patent tubular esophagus/small HH, antral erosions and ulcerations. Bx  negative for persistent H.pylori   ESOPHAGOGASTRODUODENOSCOPY  10/20/2009   DGL:OVFIE HH/prepyloric antral ulcer s/p bx (+h.pylori), ERE   ESOPHAGOGASTRODUODENOSCOPY (EGD) WITH PROPOFOL N/A 05/04/2021   Procedure: ESOPHAGOGASTRODUODENOSCOPY (EGD) WITH PROPOFOL;  Surgeon: [name], DO;  [facility];  Service: Endoscopy;  Laterality: N/A;   INTRAUTERINE DEVICE INSERTION     LEEP N/A 02/14/2021   Procedure: LOOP ELECTROSURGICAL EXCISION PROCEDURE (LEEP);  Surgeon: Janyth Pupa, DO;  Location: [facility];  Service: Gynecology;  Laterality: N/A;   MASTECTOMY, PARTIAL Left    x 2   MOUTH SURGERY     POLYPECTOMY  05/04/2021   Procedure: POLYPECTOMY;  Surgeon: [name], DO;  [facility];  Service: Endoscopy;;   ROTATOR CUFF REPAIR     right   TUBAL LIGATION      Family History  Problem Relation Age of Onset   Heart disease Other    Arthritis Other    Cancer Other    Asthma Other    Diabetes Other    Heart disease Mother    Diabetes Mother    Cancer Mother    Non-Hodgkin's lymphoma Mother    Hypertension Mother    Arthritis Mother    Lung cancer Father    Neuropathy Father    Hypertension Father  Social History   Socioeconomic History   Marital status: Widowed    Spouse name: Not on file   Number of children: Not on file   Years of education: 9   Highest education level: Not on file  Occupational History   Occupation: Scientist, water quality  Tobacco Use   Smoking status: Every Day    Packs/day: 0.50    Types: Cigarettes    Passive exposure: Current   Smokeless tobacco: Never  Vaping Use   Vaping Use: Never used  Substance and Sexual Activity   Alcohol use: No   Drug use: Yes    Frequency: 7.0 times per week    Types: Marijuana    Comment: twice daily   Sexual activity: Yes    Birth control/protection: I.U.D.  Other Topics Concern   Not on file  Social History Narrative   Not on file   Social Determinants of Health   Financial  Resource Strain: Medium Risk (12/26/2020)   Overall Financial Resource Strain (CARDIA)    Difficulty of Paying Living Expenses: Somewhat hard  Food Insecurity: No Food Insecurity (12/26/2020)   Hunger Vital Sign    Worried About Running Out of Food in the Last Year: Never true    Ran Out of Food in the Last Year: Never true  Transportation Needs: No Transportation Needs (12/26/2020)   PRAPARE - Hydrologist (Medical): No    Lack of Transportation (Non-Medical): No  Physical Activity: Inactive (12/26/2020)   Exercise Vital Sign    Days of Exercise per Week: 0 days    Minutes of Exercise per Session: 0 min  Stress: Stress Concern Present (12/26/2020)   [place]    Feeling of Stress : To some extent  Social Connections: Socially Isolated (12/26/2020)   Social Connection and Isolation Panel [NHANES]    Frequency of Communication with Friends and Family: Three times a week    Frequency of Social Gatherings with Friends and Family: Once a week    Attends Religious Services: Never    Marine scientist or Organizations: Not on file    Attends Archivist Meetings: Never    Marital Status: Widowed  Intimate Partner Violence: Not At Risk (12/26/2020)   Humiliation, Afraid, Rape, and Kick questionnaire    Fear of Current or Ex-Partner: No    Emotionally Abused: No    Physically Abused: No    Sexually Abused: No    Outpatient Medications Prior to Visit  Medication Sig Dispense Refill   ALPRAZolam (XANAX) 0.5 MG tablet TAKE ONE TABLET BY MOUTH AT BEDTIME AS NEEDED FOR ANXIETY. 30 tablet 0   benzonatate (TESSALON) 100 MG capsule TAKE 1 CAPSULE BY MOUTH 2 TIMES DAILY AS NEEDED FOR COUGH. 30 capsule 0   busPIRone (BUSPAR) 15 MG tablet TAKE 1 TABLET BY MOUTH ONCE DAILY. 30 tablet 0   gabapentin (NEURONTIN) 300 MG capsule Take 1 capsule (300 mg total) by mouth 2 (two) times daily. 180 capsule 1    hydrochlorothiazide (HYDRODIURIL) 25 MG tablet TAKE 1 TABLET BY MOUTH ONCE A DAY. 90 tablet 0   lisinopril (ZESTRIL) 40 MG tablet TAKE ONE TABLET BY MOUTH ONCE DAILY. 30 tablet 0   pantoprazole (PROTONIX) 40 MG tablet Take 1 tablet (40 mg total) by mouth 2 (two) times daily before a meal. 60 tablet 11   Plecanatide (TRULANCE) 3 MG TABS Take 3 mg by mouth daily. 90 tablet 3  traMADol (ULTRAM) 50 MG tablet Take 1 tablet (50 mg total) by mouth every 12 (twelve) hours as needed. 15 tablet 0   brompheniramine-pseudoephedrine-DM 30-2-10 MG/5ML syrup Take by mouth.     No facility-administered medications prior to visit.    Allergies  Allergen Reactions   Amoxicillin Hives   Aspirin     Cannot take due to ulcers   Nsaids     Cannot take due to ulcers     Review of Systems  Constitutional:  Negative for chills and fever.  Respiratory:  Negative for cough and shortness of breath.   Cardiovascular:  Negative for chest pain and palpitations.  Musculoskeletal:  Positive for arthralgias, back pain and gait problem. Negative for neck pain.  Skin:  Negative for rash.  Neurological:  Positive for weakness (RLE). Negative for dizziness.  Psychiatric/Behavioral:  Negative for agitation and behavioral problems.        Objective:    Physical Exam Vitals reviewed.  Constitutional:      General: She is not in acute distress.    Appearance: She is obese. She is not diaphoretic.  HENT:     Head: Normocephalic and atraumatic.     Mouth/Throat:     Mouth: Mucous membranes are moist.     Pharynx: No posterior oropharyngeal erythema.  Eyes:     General: No scleral icterus.    Extraocular Movements: Extraocular movements intact.  Neck:     Vascular: No carotid bruit.  Cardiovascular:     Rate and Rhythm: Normal rate and regular rhythm.     Pulses: Normal pulses.     Heart sounds: Normal heart sounds. No murmur heard. Pulmonary:     Breath sounds: Normal breath sounds. No wheezing or rales.   Musculoskeletal:        General: Tenderness (MCP joints of index and middle finger of left hand) present.     Cervical back: Neck supple. No tenderness.     Right lower leg: No edema.     Left lower leg: No edema.     Comments: SLR positive on right side  Skin:    General: Skin is warm.     Findings: No rash.  Neurological:     General: No focal deficit present.     Mental Status: She is alert and oriented to person, place, and time.     Sensory: No sensory deficit.     Motor: No weakness.  Psychiatric:        Mood and Affect: Mood normal.        Behavior: Behavior normal.     BP 136/84 (BP Location: Right Arm, Patient Position: Sitting, Cuff Size: Normal)   Pulse (!) 107   Resp 18   SpO2 98%  Wt Readings from Last 3 Encounters:  03/06/22 270 lb 6.4 oz (122.7 kg)  02/28/22 268 lb (121.6 kg)  11/28/21 275 lb 12.8 oz (125.1 kg)        Assessment & Plan:   Problem List Items Addressed This Visit       Nervous and Auditory   Sciatica of right side - Primary    Acute on chronic low back pain radiating to RLE Toradol IM today Sterapred taper Tramadol as needed for severe pain Flexeril as needed for muscle spasms Avoid heavy lifting and frequent bending Simple back exercises      Relevant Medications   predniSONE (STERAPRED UNI-PAK 21 TAB) 10 MG (21) TBPK tablet   traMADol (ULTRAM) 50 MG tablet  cyclobenzaprine (FLEXERIL) 5 MG tablet     Musculoskeletal and Integument   Primary osteoarthritis of left hand   Relevant Medications   predniSONE (STERAPRED UNI-PAK 21 TAB) 10 MG (21) TBPK tablet   traMADol (ULTRAM) 50 MG tablet   cyclobenzaprine (FLEXERIL) 5 MG tablet     Meds ordered this encounter  Medications   predniSONE (STERAPRED UNI-PAK 21 TAB) 10 MG (21) TBPK tablet    Sig: Take as package instructions.    Dispense:  1 each    Refill:  0   traMADol (ULTRAM) 50 MG tablet    Sig: Take 1 tablet (50 mg total) by mouth every 12 (twelve) hours as needed.     Dispense:  15 tablet    Refill:  0   cyclobenzaprine (FLEXERIL) 5 MG tablet    Sig: Take 1 tablet (5 mg total) by mouth 2 (two) times daily as needed for muscle spasms.    Dispense:  30 tablet    Refill:  0   ketorolac (TORADOL) injection 60 mg     Lindell Spar, MD

## 2022-04-30 NOTE — Patient Instructions (Signed)
Please take Prednisone as prescribed.  Please take Flexeril as needed for muscle spasms.  Please take Tramadol for severe pain. Okay to take Tylenol as needed for mild-moderate pain.

## 2022-05-01 ENCOUNTER — Other Ambulatory Visit: Payer: Self-pay | Admitting: Internal Medicine

## 2022-05-01 DIAGNOSIS — I10 Essential (primary) hypertension: Secondary | ICD-10-CM

## 2022-05-01 DIAGNOSIS — F419 Anxiety disorder, unspecified: Secondary | ICD-10-CM

## 2022-05-02 ENCOUNTER — Telehealth: Payer: Self-pay | Admitting: Internal Medicine

## 2022-05-02 ENCOUNTER — Other Ambulatory Visit: Payer: Self-pay | Admitting: Internal Medicine

## 2022-05-02 DIAGNOSIS — M5431 Sciatica, right side: Secondary | ICD-10-CM

## 2022-05-02 NOTE — Telephone Encounter (Signed)
Pt called wanting to know if she can please get a referral to see Dr. Aline Brochure at [facility]?

## 2022-05-09 ENCOUNTER — Other Ambulatory Visit: Payer: Self-pay | Admitting: Internal Medicine

## 2022-05-14 ENCOUNTER — Ambulatory Visit: Payer: 59 | Admitting: Orthopedic Surgery

## 2022-05-14 ENCOUNTER — Ambulatory Visit (INDEPENDENT_AMBULATORY_CARE_PROVIDER_SITE_OTHER): Payer: 59

## 2022-05-14 ENCOUNTER — Encounter: Payer: Self-pay | Admitting: Orthopedic Surgery

## 2022-05-14 VITALS — BP 123/75 | HR 98 | Ht 65.0 in | Wt 270.0 lb

## 2022-05-14 DIAGNOSIS — M5410 Radiculopathy, site unspecified: Secondary | ICD-10-CM | POA: Diagnosis not present

## 2022-05-14 DIAGNOSIS — M1711 Unilateral primary osteoarthritis, right knee: Secondary | ICD-10-CM | POA: Diagnosis not present

## 2022-05-14 DIAGNOSIS — M545 Low back pain, unspecified: Secondary | ICD-10-CM

## 2022-05-14 DIAGNOSIS — M25561 Pain in right knee: Secondary | ICD-10-CM

## 2022-05-14 MED ORDER — METHYLPREDNISOLONE ACETATE 40 MG/ML IJ SUSP
40.0000 mg | Freq: Once | INTRAMUSCULAR | Status: AC
Start: 2022-05-14 — End: 2022-05-14
  Administered 2022-05-14: 40 mg via INTRA_ARTICULAR

## 2022-05-14 NOTE — Patient Instructions (Addendum)
You have received an injection of steroids into the joint. 15% of patients will have increased pain within the 24 hours postinjection.   This is transient and will go away.   We recommend that you use ice packs on the injection site for 20 minutes every 2 hours and extra strength Tylenol 2 tablets every 8 as needed until the pain resolves.  If you continue to have pain after taking the Tylenol and using the ice please call the office for further instructions.  Please call and schedule appointment with neurosurgeon to evaluate your leg pain  [facility] on [address] in [place], [place] [address]

## 2022-05-14 NOTE — Progress Notes (Signed)
Chief Complaint  Patient presents with   Leg Pain    Right leg from knee down     HPI: Brenda Taylor is a 51 years old she works at [employer] she comes in with 2-week history of pain in her right leg starting at her thigh radiating down to her right foot  She has had for lumbar surgeries by [facility] she denies back pain  She has taken some tramadol gabapentin and steroid Dosepak with no improvement.  She says the leg feels weird and unstable she is wearing a knee brace that has not helped  The last x-ray we have was from 2019 she had to space narrowing at L4-5 and L5-S1  SHE'S HAVING A LOT OF PAIN AT NIGHT !   Past Medical History:  Diagnosis Date   Anxiety    Chronic pain    DDD (degenerative disc disease)    GERD (gastroesophageal reflux disease)    HBP (high blood pressure)    Headache    Hernia    Multiple gastric ulcers    Pre-diabetes    Rotator cuff syndrome of left shoulder 10/09/2011   Ulcer disease     BP 123/75   Pulse 98   Ht '5\' 5"'$  (1.651 m)   Wt 270 lb (122.5 kg)   BMI 44.93 kg/m    General appearance: Well-developed well-nourished no gross deformities   Musculoskeletal: Focused exam includes examination of the lumbar spine where she is tender L4-S1 right SI joint and gluteal region with palpation sending shocklike sensation down the right leg  Right knee  Imaging x-ray lumbar spine and right knee the knee x-ray shows grade 2 arthritis the spinal imaging shows L4-5 and L5-S1 disc space narrowing consistent with her prior surgeries  A/P  Mild arthritis right knee does not explain the patient's symptoms as the pain radiates down to the foot she has tenderness in her lower back history of multiple back surgeries  We will go ahead and inject the right knee to help with any pain that may be occurring from the knee but the patient needs to see [facility] for further evaluation  MRI imaging not indicated for the knee

## 2022-06-02 IMAGING — DX DG CHEST 2V
2 series · 2 of 2 positions shown · non-contrast
Comparison: None.

CLINICAL DATA: Chest tightness cough for 5 days.

EXAM:
CHEST - 2 VIEW

[chest pa]
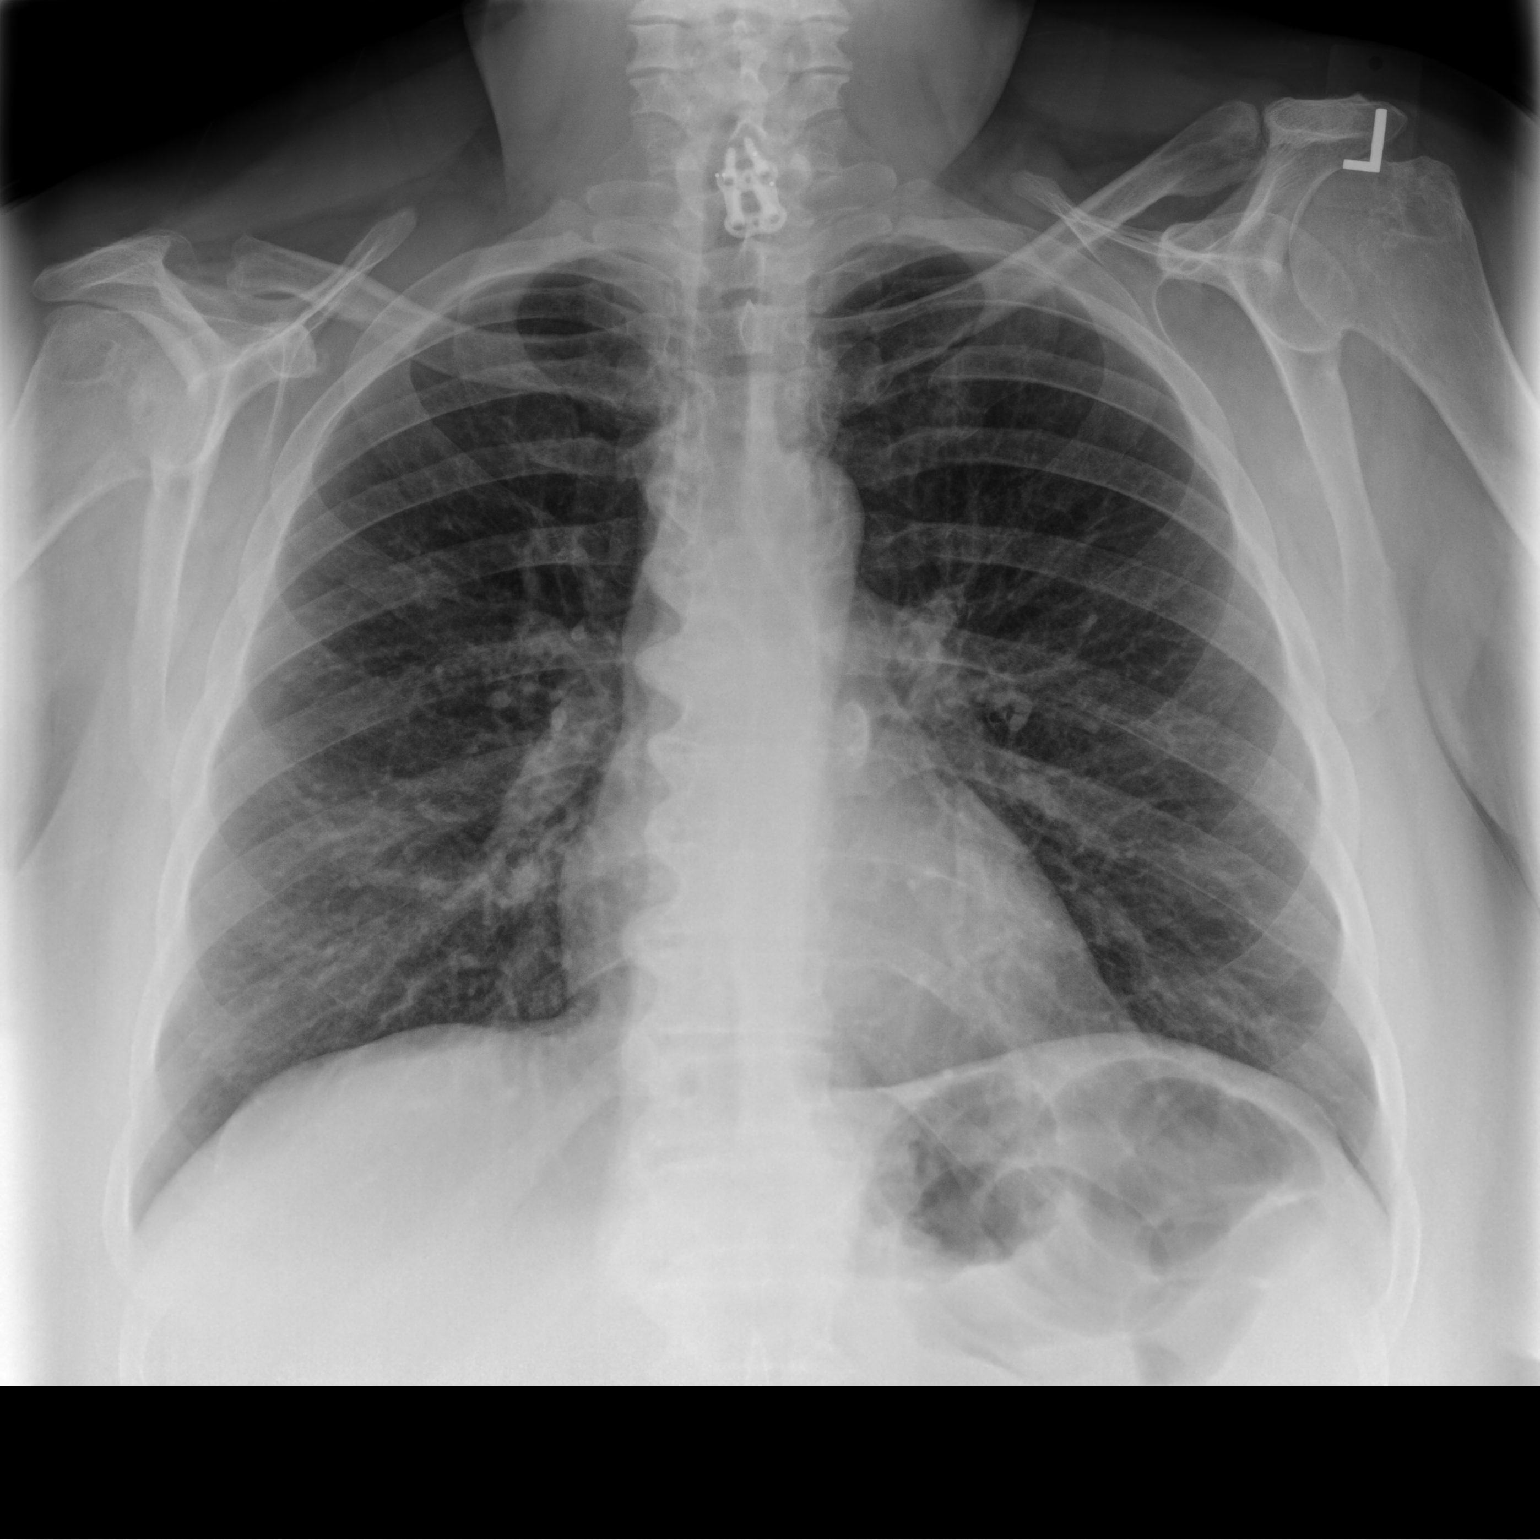

[chest lat]
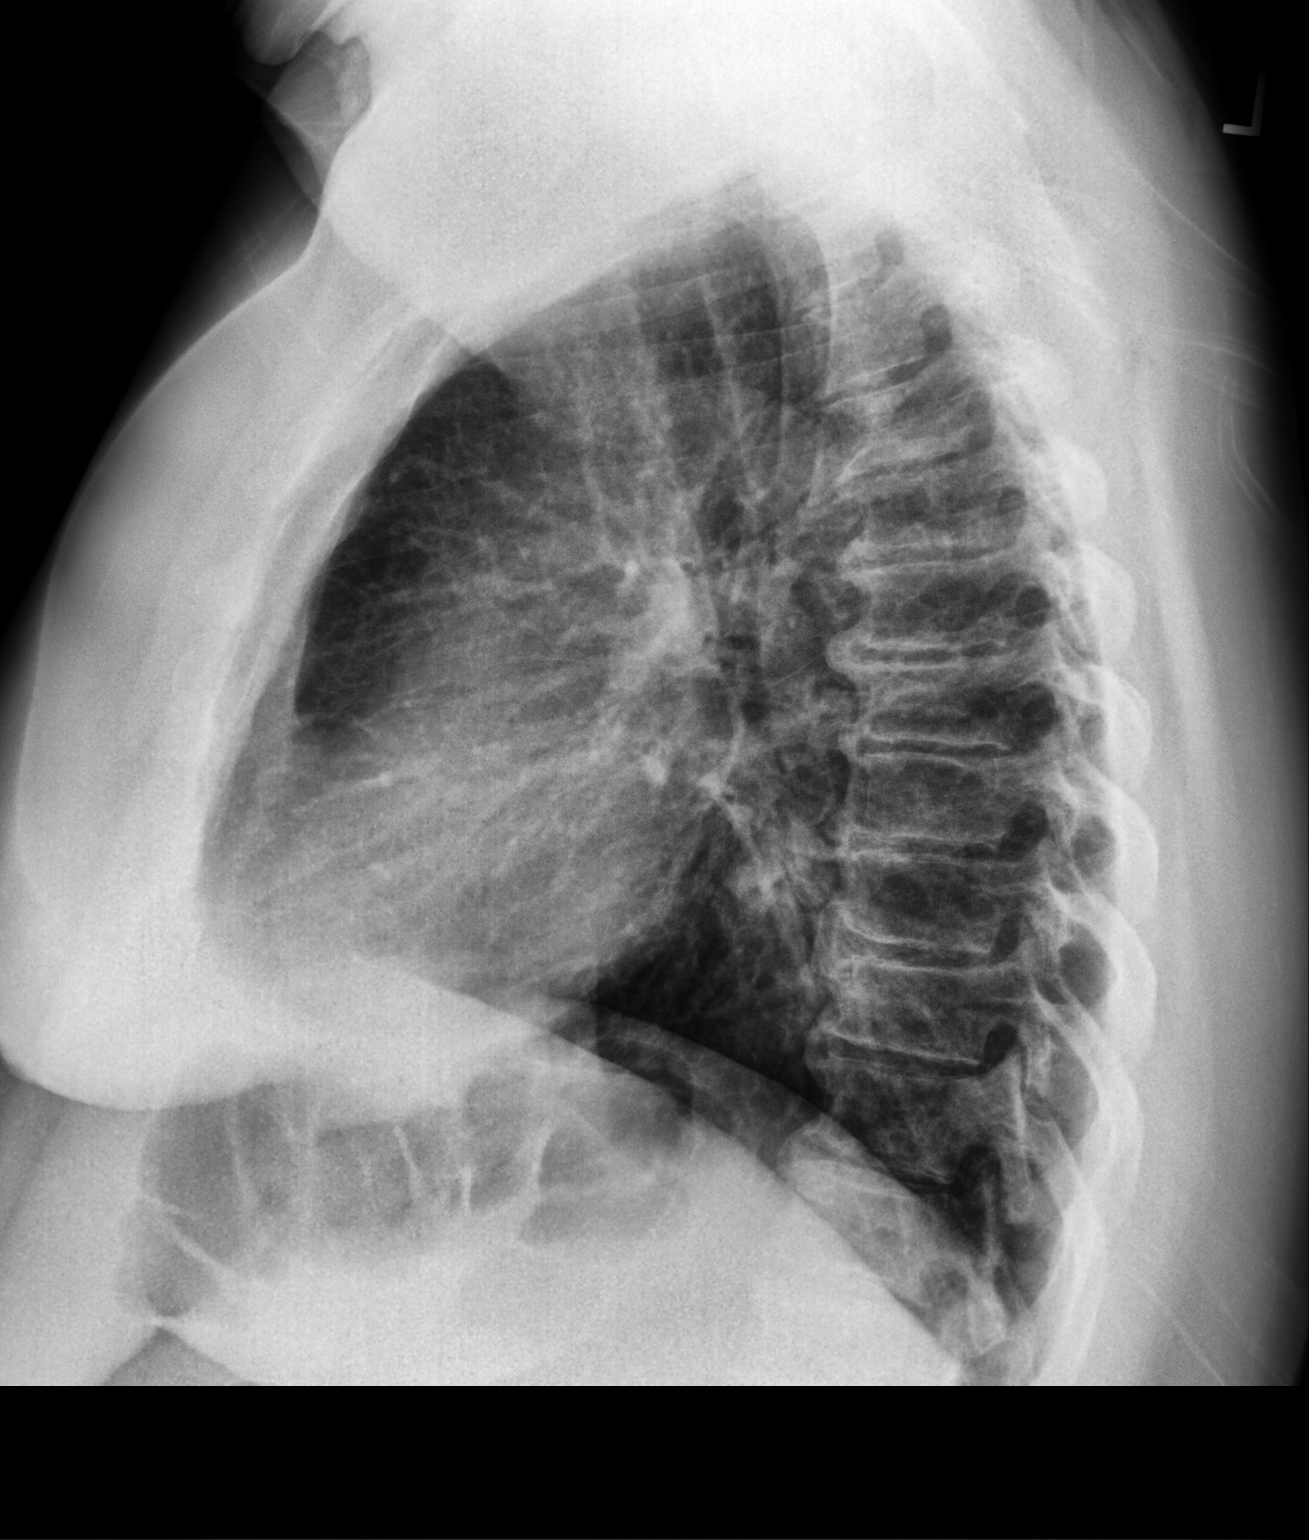

[2 of 2 positions shown; findings below may reference images not displayed]

FINDINGS: The heart size and mediastinal contours are within normal limits.
Bilateral bronchial thickening concerning for bronchitis or reactive
airway disease. No focal consolidation or pleural effusion. Anterior
cervical discectomy and fusion hardware. No acute osseous
abnormality.
IMPRESSION: 1. No focal consolidation or pleural effusion.
2. Bilateral bronchial thickening concerning for bronchitis or
reactive airway disease.

## 2022-06-04 ENCOUNTER — Other Ambulatory Visit: Payer: Self-pay | Admitting: Internal Medicine

## 2022-06-04 DIAGNOSIS — F419 Anxiety disorder, unspecified: Secondary | ICD-10-CM

## 2022-06-04 DIAGNOSIS — I10 Essential (primary) hypertension: Secondary | ICD-10-CM

## 2022-06-06 ENCOUNTER — Other Ambulatory Visit: Payer: Self-pay | Admitting: Internal Medicine

## 2022-06-06 MED ORDER — PANTOPRAZOLE SODIUM 40 MG PO TBEC: 40.0000 mg | DELAYED_RELEASE_TABLET | Freq: Two times a day (BID) | ORAL | 11 refills | Status: DC

## 2022-07-05 ENCOUNTER — Other Ambulatory Visit: Payer: Self-pay | Admitting: Internal Medicine

## 2022-07-05 DIAGNOSIS — F419 Anxiety disorder, unspecified: Secondary | ICD-10-CM

## 2022-07-05 DIAGNOSIS — I10 Essential (primary) hypertension: Secondary | ICD-10-CM

## 2022-07-10 ENCOUNTER — Ambulatory Visit (INDEPENDENT_AMBULATORY_CARE_PROVIDER_SITE_OTHER): Payer: 59 | Admitting: Internal Medicine

## 2022-07-10 ENCOUNTER — Encounter: Payer: Self-pay | Admitting: Internal Medicine

## 2022-07-10 VITALS — BP 127/82 | HR 107 | Ht 65.0 in | Wt 275.2 lb

## 2022-07-10 DIAGNOSIS — E782 Mixed hyperlipidemia: Secondary | ICD-10-CM

## 2022-07-10 DIAGNOSIS — Z23 Encounter for immunization: Secondary | ICD-10-CM

## 2022-07-10 DIAGNOSIS — F411 Generalized anxiety disorder: Secondary | ICD-10-CM | POA: Diagnosis not present

## 2022-07-10 DIAGNOSIS — Z0001 Encounter for general adult medical examination with abnormal findings: Secondary | ICD-10-CM

## 2022-07-10 DIAGNOSIS — Z1159 Encounter for screening for other viral diseases: Secondary | ICD-10-CM

## 2022-07-10 DIAGNOSIS — E559 Vitamin D deficiency, unspecified: Secondary | ICD-10-CM

## 2022-07-10 DIAGNOSIS — M19041 Primary osteoarthritis, right hand: Secondary | ICD-10-CM | POA: Diagnosis not present

## 2022-07-10 DIAGNOSIS — G609 Hereditary and idiopathic neuropathy, unspecified: Secondary | ICD-10-CM

## 2022-07-10 DIAGNOSIS — R69 Illness, unspecified: Secondary | ICD-10-CM | POA: Diagnosis not present

## 2022-07-10 DIAGNOSIS — M19042 Primary osteoarthritis, left hand: Secondary | ICD-10-CM

## 2022-07-10 DIAGNOSIS — R7303 Prediabetes: Secondary | ICD-10-CM | POA: Diagnosis not present

## 2022-07-10 DIAGNOSIS — I10 Essential (primary) hypertension: Secondary | ICD-10-CM

## 2022-07-10 MED ORDER — GABAPENTIN 300 MG PO CAPS: 300.0000 mg | ORAL_CAPSULE | Freq: Three times a day (TID) | ORAL | 1 refills | Status: DC

## 2022-07-10 NOTE — Assessment & Plan Note (Signed)
Check lipid profile Follow DASH diet If persistently elevated, will start statin

## 2022-07-10 NOTE — Assessment & Plan Note (Addendum)
Well-controlled with BuSpar and Xanax Check Toxassure

## 2022-07-10 NOTE — Progress Notes (Signed)
Established Patient Office Visit  Subjective:  Patient ID: Brenda Taylor, female    DOB: 16-Nov-1970  Age: 51 y.o. MRN: [id number]  CC:  Chief Complaint  Patient presents with   Annual Exam    Patient is having issues with both hands and feet. Also fatigued and overly tired    HPI Brenda Taylor is a 51 y.o. female with past medical history of hypertension, anxiety, OA and colonic polyp who presents for annual physical.  HTN: BP is well-controlled. Takes medications regularly. Patient denies headache, dizziness, chest pain, dyspnea or palpitations.   Left hand pain: She complains of chronic left hand pain, worse in the [facility] joint of the thumb and the MCP joints of index and middle fingers.  She has tried Tylenol, ibuprofen, Goody powder and other OTC NSAIDs with minimal relief.  She denies any recent injury.  She was told of OA of hand by rheumatologist.  Of note, she has history of GERD and was told to avoid oral NSAIDs by her GI.  She has been taking gabapentin 300 mg BID. She still c/o b/l leg pain and burning pain in her feet, which is worse at nighttime. Her pain gets slightly better with walking. She also reports leg cramps at nighttime. Denies any fall or recent injury.  Anxiety: Now well-controlled with BuSpar and Xanax. Denies any anhedonia, SI or HI.  She still complains of severe fatigue, which is chronic.  She has noticed weight gain.  Continues to smoke 0.5 pack/day.   Past Medical History:  Diagnosis Date   Anxiety    Chronic pain    DDD (degenerative disc disease)    GERD (gastroesophageal reflux disease)    HBP (high blood pressure)    Headache    Hernia    Multiple gastric ulcers    Pre-diabetes    Rotator cuff syndrome of left shoulder 10/09/2011   Ulcer disease     Past Surgical History:  Procedure Laterality Date   APPENDECTOMY     BACK SURGERY     x5   BIOPSY  05/04/2021   Procedure: BIOPSY;  Surgeon: [name], DO;  [facility];   Service: Endoscopy;;   BREAST SURGERY     CARPAL TUNNEL RELEASE     bilateral   CHOLECYSTECTOMY     COLONOSCOPY  10/20/2009   QQI:WLNLGXQ anal canal/left-side diverticula and multiple polyps (hyperplastic). poor prep compromised exam. Next TCS 09/2014.   COLONOSCOPY WITH PROPOFOL N/A 05/04/2021   Procedure: COLONOSCOPY WITH PROPOFOL;  Surgeon: [name], DO;  Location: [facility];  Service: Endoscopy;  Laterality: N/A;  9:45am   ESOPHAGOGASTRODUODENOSCOPY  03/06/2010   JJH:ERDE quadrant distal esophageal/patent tubular esophagus/small HH, antral erosions and ulcerations. Bx negative for persistent H.pylori   ESOPHAGOGASTRODUODENOSCOPY  10/20/2009   YCX:KGYJE HH/prepyloric antral ulcer s/p bx (+h.pylori), ERE   ESOPHAGOGASTRODUODENOSCOPY (EGD) WITH PROPOFOL N/A 05/04/2021   Procedure: ESOPHAGOGASTRODUODENOSCOPY (EGD) WITH PROPOFOL;  Surgeon: [name], DO;  [facility]: [facility];  Service: Endoscopy;  Laterality: N/A;   INTRAUTERINE DEVICE INSERTION     LEEP N/A 02/14/2021   Procedure: LOOP ELECTROSURGICAL EXCISION PROCEDURE (LEEP);  Surgeon: Janyth Pupa, DO;  Location: [facility];  Service: Gynecology;  Laterality: N/A;   MASTECTOMY, PARTIAL Left    x 2   MOUTH SURGERY     POLYPECTOMY  05/04/2021   Procedure: POLYPECTOMY;  Surgeon: [name], DO;  Location: [facility];  Service: Endoscopy;;   ROTATOR  CUFF REPAIR     right   TUBAL LIGATION      Family History  Problem Relation Age of Onset   Heart disease Other    Arthritis Other    Cancer Other    Asthma Other    Diabetes Other    Heart disease Mother    Diabetes Mother    Cancer Mother    Non-Hodgkin's lymphoma Mother    Hypertension Mother    Arthritis Mother    Lung cancer Father    Neuropathy Father    Hypertension Father     Social History   Socioeconomic History   Marital status: Widowed    Spouse name: Not on file   Number of children: Not on file   Years of education: 9    Highest education level: Not on file  Occupational History   Occupation: Scientist, water quality  Tobacco Use   Smoking status: Every Day    Packs/day: 0.50    Types: Cigarettes    Passive exposure: Current   Smokeless tobacco: Never  Vaping Use   Vaping Use: Never used  Substance and Sexual Activity   Alcohol use: No   Drug use: Yes    Frequency: 7.0 times per week    Types: Marijuana    Comment: twice daily   Sexual activity: Yes    Birth control/protection: I.U.D.  Other Topics Concern   Not on file  Social History Narrative   Not on file   Social Determinants of Health   Financial Resource Strain: Medium Risk (12/26/2020)   Overall Financial Resource Strain (CARDIA)    Difficulty of Paying Living Expenses: Somewhat hard  Food Insecurity: No Food Insecurity (12/26/2020)   Hunger Vital Sign    Worried About Running Out of Food in the Last Year: Never true    Ran Out of Food in the Last Year: Never true  Transportation Needs: No Transportation Needs (12/26/2020)   PRAPARE - Hydrologist (Medical): No    Lack of Transportation (Non-Medical): No  Physical Activity: Inactive (12/26/2020)   Exercise Vital Sign    Days of Exercise per Week: 0 days    Minutes of Exercise per Session: 0 min  Stress: Stress Concern Present (12/26/2020)   [place]    Feeling of Stress : To some extent  Social Connections: Socially Isolated (12/26/2020)   Social Connection and Isolation Panel [NHANES]    Frequency of Communication with Friends and Family: Three times a week    Frequency of Social Gatherings with Friends and Family: Once a week    Attends Religious Services: Never    Marine scientist or Organizations: Not on file    Attends Archivist Meetings: Never    Marital Status: Widowed  Intimate Partner Violence: Not At Risk (12/26/2020)   Humiliation, Afraid, Rape, and Kick questionnaire    Fear of  Current or Ex-Partner: No    Emotionally Abused: No    Physically Abused: No    Sexually Abused: No    Outpatient Medications Prior to Visit  Medication Sig Dispense Refill   ALPRAZolam (XANAX) 0.5 MG tablet TAKE ONE TABLET BY MOUTH AT BEDTIME AS NEEDED FOR ANXIETY. 30 tablet 0   benzonatate (TESSALON) 100 MG capsule TAKE 1 CAPSULE BY MOUTH 2 TIMES DAILY AS NEEDED FOR COUGH. 30 capsule 0   brompheniramine-pseudoephedrine-DM 30-2-10 MG/5ML syrup Take by mouth.     busPIRone (BUSPAR) 15  MG tablet TAKE 1 TABLET BY MOUTH ONCE DAILY. 30 tablet 3   cyclobenzaprine (FLEXERIL) 5 MG tablet Take 1 tablet (5 mg total) by mouth 2 (two) times daily as needed for muscle spasms. 30 tablet 0   hydrochlorothiazide (HYDRODIURIL) 25 MG tablet TAKE 1 TABLET BY MOUTH ONCE A DAY. 90 tablet 0   lisinopril (ZESTRIL) 40 MG tablet TAKE ONE TABLET BY MOUTH ONCE DAILY. 30 tablet 3   pantoprazole (PROTONIX) 40 MG tablet Take 1 tablet (40 mg total) by mouth 2 (two) times daily before a meal. 60 tablet 11   Plecanatide (TRULANCE) 3 MG TABS Take 3 mg by mouth daily. 90 tablet 3   gabapentin (NEURONTIN) 300 MG capsule Take 1 capsule (300 mg total) by mouth 2 (two) times daily. 180 capsule 1   No facility-administered medications prior to visit.    Allergies  Allergen Reactions   Amoxicillin Hives   Aspirin     Cannot take due to ulcers   Nsaids     Cannot take due to ulcers     ROS Review of Systems  Constitutional:  Positive for fatigue. Negative for chills and fever.  HENT:  Positive for hearing loss (Right sided). Negative for congestion, postnasal drip, sinus pressure, sinus pain and sore throat.   Eyes:  Negative for pain and discharge.  Respiratory:  Negative for cough and shortness of breath.   Cardiovascular:  Negative for chest pain and palpitations.  Gastrointestinal:  Negative for abdominal pain, diarrhea, nausea and vomiting.  Endocrine: Negative for polydipsia and polyuria.  Genitourinary:   Negative for dysuria and hematuria.  Musculoskeletal:  Positive for arthralgias. Negative for neck pain and neck stiffness.  Skin:  Negative for rash.  Neurological:  Positive for numbness. Negative for dizziness, seizures, syncope, speech difficulty and light-headedness.  Psychiatric/Behavioral:  Positive for sleep disturbance. Negative for agitation and behavioral problems. The patient is nervous/anxious.       Objective:    Physical Exam Vitals reviewed.  Constitutional:      General: She is not in acute distress.    Appearance: She is obese. She is not diaphoretic.  HENT:     Head: Normocephalic and atraumatic.     Nose: Nose normal. No congestion.     Mouth/Throat:     Mouth: Mucous membranes are moist.     Pharynx: No posterior oropharyngeal erythema.  Eyes:     General: No scleral icterus.    Extraocular Movements: Extraocular movements intact.  Neck:     Vascular: No carotid bruit.  Cardiovascular:     Rate and Rhythm: Normal rate and regular rhythm.     Pulses: Normal pulses.     Heart sounds: Normal heart sounds. No murmur heard. Pulmonary:     Breath sounds: Normal breath sounds. No wheezing or rales.  Abdominal:     Palpations: Abdomen is soft.     Tenderness: There is no abdominal tenderness.  Musculoskeletal:        General: Tenderness (MCP joints of index and middle finger of left hand) present.     Cervical back: Neck supple. No tenderness.     Right lower leg: No edema.     Left lower leg: No edema.  Skin:    General: Skin is warm.     Findings: No rash.  Neurological:     General: No focal deficit present.     Mental Status: She is alert and oriented to person, place, and time.  Sensory: No sensory deficit.     Motor: No weakness.  Psychiatric:        Mood and Affect: Mood normal.        Behavior: Behavior normal.     BP 127/82 (BP Location: Right Arm, Patient Position: Sitting, Cuff Size: Large)   Pulse (!) 107   Ht _0  (1.651 m)   Wt  275 lb 3.2 oz (124.8 kg)   SpO2 98%   BMI 45.80 kg/m  Wt Readings from Last 3 Encounters:  07/10/22 275 lb 3.2 oz (124.8 kg)  05/14/22 270 lb (122.5 kg)  03/06/22 270 lb 6.4 oz (122.7 kg)    Lab Results  Component Value Date   TSH 1.730 09/14/2020   Lab Results  Component Value Date   WBC 11.2 (H) 05/15/2021   HGB 11.7 05/15/2021   HCT 34.3 05/15/2021   MCV 89 05/15/2021   PLT 274 05/15/2021   Lab Results  Component Value Date   NA 139 08/31/2021   K 3.8 08/31/2021   CO2 21 08/31/2021   GLUCOSE 123 (H) 08/31/2021   BUN 24 08/31/2021   CREATININE 0.96 08/31/2021   BILITOT <0.2 05/15/2021   ALKPHOS 95 05/15/2021   AST 16 05/15/2021   ALT 14 05/15/2021   PROT 7.4 05/15/2021   ALBUMIN 4.3 05/15/2021   CALCIUM 10.0 08/31/2021   ANIONGAP 7 04/25/2021   EGFR 71 08/31/2021   Lab Results  Component Value Date   CHOL 156 05/15/2021   Lab Results  Component Value Date   HDL 31 (L) 05/15/2021   Lab Results  Component Value Date   LDLCALC 105 (H) 05/15/2021   Lab Results  Component Value Date   TRIG 110 05/15/2021   Lab Results  Component Value Date   CHOLHDL 5.0 (H) 05/15/2021   Lab Results  Component Value Date   HGBA1C 6.0 (H) 05/15/2021      Assessment & Plan:   Problem List Items Addressed This Visit       Cardiovascular and Mediastinum   Hypertension    BP: 127/82   Well-controlled with lisinopril and HCTZ Advised to stay compliant with medications Advised DASH diet and moderate exercise as tolerated F/u CMP and lipid profile      Relevant Orders   TSH   CBC with Differential/Platelet     Nervous and Auditory   Idiopathic peripheral neuropathy    Slightly better with Gabapentin 300 mg BID, but still has numbness of legs Also has leg cramps better with walking, concern for RLS - increase dose of Gabapentin to 300 mg TID      Relevant Medications   gabapentin (NEURONTIN) 300 MG capsule   Other Relevant Orders   TSH   CBC with  Differential/Platelet     Musculoskeletal and Integument   Primary osteoarthritis of hands, bilateral    L > R hand OA Unable to take oral NSAIDs due to GERD Tylenol arthritis as needed for mild-to-moderate pain Tramadol as needed for severe pain Referred to hand surgery      Relevant Orders   Ambulatory referral to Orthopedic Surgery     Other   GAD (generalized anxiety disorder)    Well-controlled with BuSpar and Xanax Check Toxassure      Relevant Orders   TSH   CBC with Differential/Platelet   Prediabetes    HbA1C: 5.7 Advised to follow diabetic diet On ACEi F/u CMP and lipid panel      Relevant Orders  Hemoglobin A1c   CMP14+EGFR   Vitamin D deficiency    Last vitamin D Lab Results  Component Value Date   VD25OH 17.1 (L) 09/14/2020  Has fatigue with Vitamin D 50,000 IU Advised to take vitamin D 5000 IU QD instead.      Relevant Orders   VITAMIN D 25 Hydroxy (Vit-D Deficiency, Fractures)   Encounter for general adult medical examination with abnormal findings - Primary    Physical exam as documented. Fasting blood tests ordered. Flu vaccine today.      Mixed hyperlipidemia    Check lipid profile Follow DASH diet If persistently elevated, will start statin      Relevant Orders   Lipid panel   Other Visit Diagnoses     Need for hepatitis C screening test       Relevant Orders   Hepatitis C Antibody   Need for immunization against influenza       Relevant Orders   Flu Vaccine QUAD 61moIM (Fluarix, Fluzone & Alfiuria Quad PF) (Completed)       Meds ordered this encounter  Medications   gabapentin (NEURONTIN) 300 MG capsule    Sig: Take 1 capsule (300 mg total) by mouth 3 (three) times daily.    Dispense:  270 capsule    Refill:  1    Dose change    Follow-up: Return in about 4 months (around 11/09/2022) for HTN and GAD.    RLindell Spar MD

## 2022-07-10 NOTE — Patient Instructions (Signed)
Please take Gabapentin 300 mg 3 times daily.  Please take Tylenol arthritis as needed for joint pain.  Please continue to follow low carb diet and ambulate as tolerated.

## 2022-07-10 NOTE — Assessment & Plan Note (Signed)
Slightly better with Gabapentin 300 mg BID, but still has numbness of legs Also has leg cramps better with walking, concern for RLS - increase dose of Gabapentin to 300 mg TID

## 2022-07-10 NOTE — Assessment & Plan Note (Signed)
Last vitamin D Lab Results  Component Value Date   VD25OH 17.1 (L) 09/14/2020   Has fatigue with Vitamin D 50,000 IU Advised to take vitamin D 5000 IU QD instead.

## 2022-07-10 NOTE — Assessment & Plan Note (Signed)
HbA1C: 5.7 Advised to follow diabetic diet On ACEi F/u CMP and lipid panel

## 2022-07-10 NOTE — Assessment & Plan Note (Addendum)
Physical exam as documented. Fasting blood tests ordered. Flu vaccine today.

## 2022-07-10 NOTE — Assessment & Plan Note (Signed)
L > R hand OA Unable to take oral NSAIDs due to GERD Tylenol arthritis as needed for mild-to-moderate pain Tramadol as needed for severe pain Referred to hand surgery

## 2022-07-10 NOTE — Assessment & Plan Note (Signed)
BP: 127/82   Well-controlled with lisinopril and HCTZ Advised to stay compliant with medications Advised DASH diet and moderate exercise as tolerated F/u CMP and lipid profile

## 2022-08-13 ENCOUNTER — Other Ambulatory Visit: Payer: Self-pay | Admitting: Internal Medicine

## 2022-08-13 DIAGNOSIS — F419 Anxiety disorder, unspecified: Secondary | ICD-10-CM

## 2022-08-30 NOTE — Progress Notes (Deleted)
Referring Provider: Lindell Spar, MD Primary Care Physician:  Lindell Spar, MD Primary GI Physician: Dr. Abbey Chatters  No chief complaint on file.   HPI:   Brenda Taylor is a 52 y.o. female with history of IBS-C, GERD, adenomatous colon polyps due for surveillance in 2027, presenting today for follow-up.  Last seen in our office 02/28/2022.  IBS will controlled with Trulance 3 mg daily.  GERD doing well on pantoprazole.  Recommended continuing current medications and follow-up in 6 months.    Past Medical History:  Diagnosis Date   Anxiety    Chronic pain    DDD (degenerative disc disease)    GERD (gastroesophageal reflux disease)    HBP (high blood pressure)    Headache    Hernia    Multiple gastric ulcers    Pre-diabetes    Rotator cuff syndrome of left shoulder 10/09/2011   Ulcer disease     Past Surgical History:  Procedure Laterality Date   APPENDECTOMY     BACK SURGERY     x5   BIOPSY  05/04/2021   Procedure: BIOPSY;  Surgeon: [name], DO;  Location: [facility];  Service: Endoscopy;;   BREAST SURGERY     CARPAL TUNNEL RELEASE     bilateral   CHOLECYSTECTOMY     COLONOSCOPY  10/20/2009   UDJ:SHFWYOV anal canal/left-side diverticula and multiple polyps (hyperplastic). poor prep compromised exam. Next TCS 09/2014.   COLONOSCOPY WITH PROPOFOL N/A 05/04/2021   Procedure: COLONOSCOPY WITH PROPOFOL;  Surgeon: [name], DO;  Location: [facility];  Service: Endoscopy;  Laterality: N/A;  9:45am   ESOPHAGOGASTRODUODENOSCOPY  03/06/2010   ZCH:YIFO quadrant distal esophageal/patent tubular esophagus/small HH, antral erosions and ulcerations. Bx negative for persistent H.pylori   ESOPHAGOGASTRODUODENOSCOPY  10/20/2009   YDX:AJOIN HH/prepyloric antral ulcer s/p bx (+h.pylori), ERE   ESOPHAGOGASTRODUODENOSCOPY (EGD) WITH PROPOFOL N/A 05/04/2021   Procedure: ESOPHAGOGASTRODUODENOSCOPY (EGD) WITH PROPOFOL;  Surgeon: [name], DO;  Location: [facility];  Service: Endoscopy;  Laterality: N/A;   INTRAUTERINE DEVICE INSERTION     LEEP N/A 02/14/2021   Procedure: LOOP ELECTROSURGICAL EXCISION PROCEDURE (LEEP);  Surgeon: Janyth Pupa, DO;  Location: [facility];  Service: Gynecology;  Laterality: N/A;   MASTECTOMY, PARTIAL Left    x 2   MOUTH SURGERY     POLYPECTOMY  05/04/2021   Procedure: POLYPECTOMY;  Surgeon: [name], DO;  Location: [facility];  Service: Endoscopy;;   ROTATOR CUFF REPAIR     right   TUBAL LIGATION      Current Outpatient Medications  Medication Sig Dispense Refill   ALPRAZolam (XANAX) 0.5 MG tablet TAKE ONE TABLET BY MOUTH AT BEDTIME AS NEEDED FOR ANXIETY. 30 tablet 2   benzonatate (TESSALON) 100 MG capsule TAKE 1 CAPSULE BY MOUTH 2 TIMES DAILY AS NEEDED FOR COUGH. 30 capsule 0   brompheniramine-pseudoephedrine-DM 30-2-10 MG/5ML syrup Take by mouth.     busPIRone (BUSPAR) 15 MG tablet TAKE 1 TABLET BY MOUTH ONCE DAILY. 30 tablet 3   cyclobenzaprine (FLEXERIL) 5 MG tablet Take 1 tablet (5 mg total) by mouth 2 (two) times daily as needed for muscle spasms. 30 tablet 0   gabapentin (NEURONTIN) 300 MG capsule Take 1 capsule (300 mg total) by mouth 3 (three) times daily. 270 capsule 1   hydrochlorothiazide (HYDRODIURIL) 25 MG tablet TAKE 1 TABLET BY MOUTH ONCE A DAY. 90 tablet 0   lisinopril (ZESTRIL) 40 MG tablet TAKE  ONE TABLET BY MOUTH ONCE DAILY. 30 tablet 3   pantoprazole (PROTONIX) 40 MG tablet Take 1 tablet (40 mg total) by mouth 2 (two) times daily before a meal. 60 tablet 11   Plecanatide (TRULANCE) 3 MG TABS Take 3 mg by mouth daily. 90 tablet 3   No current facility-administered medications for this visit.    Allergies as of 08/31/2022 - Review Complete 07/10/2022  Allergen Reaction Noted   Amoxicillin Hives    Aspirin  08/20/2012   Nsaids  04/20/2021    Family History  Problem Relation Age of Onset   Heart disease Other    Arthritis Other    Cancer Other    Asthma Other     Diabetes Other    Heart disease Mother    Diabetes Mother    Cancer Mother    Non-Hodgkin's lymphoma Mother    Hypertension Mother    Arthritis Mother    Lung cancer Father    Neuropathy Father    Hypertension Father     Social History   Socioeconomic History   Marital status: Widowed    Spouse name: Not on file   Number of children: Not on file   Years of education: 9   Highest education level: Not on file  Occupational History   Occupation: Scientist, water quality  Tobacco Use   Smoking status: Every Day    Packs/day: 0.50    Types: Cigarettes    Passive exposure: Current   Smokeless tobacco: Never  Vaping Use   Vaping Use: Never used  Substance and Sexual Activity   Alcohol use: No   Drug use: Yes    Frequency: 7.0 times per week    Types: Marijuana    Comment: twice daily   Sexual activity: Yes    Birth control/protection: I.U.D.  Other Topics Concern   Not on file  Social History Narrative   Not on file   Social Determinants of Health   Financial Resource Strain: Medium Risk (12/26/2020)   Overall Financial Resource Strain (CARDIA)    Difficulty of Paying Living Expenses: Somewhat hard  Food Insecurity: No Food Insecurity (12/26/2020)   Hunger Vital Sign    Worried About Running Out of Food in the Last Year: Never true    Ran Out of Food in the Last Year: Never true  Transportation Needs: No Transportation Needs (12/26/2020)   PRAPARE - Hydrologist (Medical): No    Lack of Transportation (Non-Medical): No  Physical Activity: Inactive (12/26/2020)   Exercise Vital Sign    Days of Exercise per Week: 0 days    Minutes of Exercise per Session: 0 min  Stress: Stress Concern Present (12/26/2020)   [place]    Feeling of Stress : To some extent  Social Connections: Socially Isolated (12/26/2020)   Social Connection and Isolation Panel [NHANES]    Frequency of Communication with Friends  and Family: Three times a week    Frequency of Social Gatherings with Friends and Family: Once a week    Attends Religious Services: Never    Marine scientist or Organizations: Not on file    Attends Archivist Meetings: Never    Marital Status: Widowed    Review of Systems: Gen: Denies fever, chills, anorexia. Denies fatigue, weakness, weight loss.  CV: Denies chest pain, palpitations, syncope, peripheral edema, and claudication. Resp: Denies dyspnea at rest, cough, wheezing, coughing up blood, and pleurisy.  GI: Denies vomiting blood, jaundice, and fecal incontinence.   Denies dysphagia or odynophagia. Derm: Denies rash, itching, dry skin Psych: Denies depression, anxiety, memory loss, confusion. No homicidal or suicidal ideation.  Heme: Denies bruising, bleeding, and enlarged lymph nodes.  Physical Exam: There were no vitals taken for this visit. General:   Alert and oriented. No distress noted. Pleasant and cooperative.  Head:  Normocephalic and atraumatic. Eyes:  Conjuctiva clear without scleral icterus. Heart:  S1, S2 present without murmurs appreciated. Lungs:  Clear to auscultation bilaterally. No wheezes, rales, or rhonchi. No distress.  Abdomen:  +BS, soft, non-tender and non-distended. No rebound or guarding. No HSM or masses noted. Msk:  Symmetrical without gross deformities. Normal posture. Extremities:  Without edema. Neurologic:  Alert and  oriented x4 Psych:  Normal mood and affect.    Assessment:     Plan:  ***   Aliene Altes, PA-C [facility] 08/31/2022

## 2022-08-31 ENCOUNTER — Ambulatory Visit: Payer: Self-pay | Admitting: Gastroenterology

## 2022-09-03 DIAGNOSIS — M79641 Pain in right hand: Secondary | ICD-10-CM | POA: Diagnosis not present

## 2022-09-03 DIAGNOSIS — M79642 Pain in left hand: Secondary | ICD-10-CM | POA: Diagnosis not present

## 2022-09-05 ENCOUNTER — Other Ambulatory Visit: Payer: Self-pay | Admitting: Internal Medicine

## 2022-09-13 ENCOUNTER — Encounter: Payer: Self-pay | Admitting: Family Medicine

## 2022-09-13 ENCOUNTER — Ambulatory Visit (HOSPITAL_COMMUNITY)
Admission: RE | Admit: 2022-09-13 | Discharge: 2022-09-13 | Disposition: A | Discharge status: Home / Self Care | Payer: 59 | Source: Ambulatory Visit | Attending: Family Medicine | Admitting: Family Medicine

## 2022-09-13 ENCOUNTER — Ambulatory Visit (INDEPENDENT_AMBULATORY_CARE_PROVIDER_SITE_OTHER): Payer: 59 | Admitting: Family Medicine

## 2022-09-13 VITALS — BP 101/66 | HR 95 | Temp 98.1°F | Ht 65.0 in | Wt 279.0 lb

## 2022-09-13 DIAGNOSIS — I10 Essential (primary) hypertension: Secondary | ICD-10-CM | POA: Diagnosis not present

## 2022-09-13 DIAGNOSIS — R051 Acute cough: Secondary | ICD-10-CM | POA: Diagnosis not present

## 2022-09-13 DIAGNOSIS — J019 Acute sinusitis, unspecified: Secondary | ICD-10-CM | POA: Insufficient documentation

## 2022-09-13 DIAGNOSIS — J0140 Acute pansinusitis, unspecified: Secondary | ICD-10-CM

## 2022-09-13 DIAGNOSIS — J209 Acute bronchitis, unspecified: Secondary | ICD-10-CM | POA: Diagnosis not present

## 2022-09-13 MED ORDER — AZITHROMYCIN 250 MG PO TABS: ORAL_TABLET | ORAL | 0 refills | Status: AC

## 2022-09-13 MED ORDER — PREDNISONE 10 MG PO TABS: ORAL_TABLET | ORAL | 0 refills | Status: DC

## 2022-09-13 MED ORDER — PROMETHAZINE-DM 6.25-15 MG/5ML PO SYRP: ORAL_SOLUTION | ORAL | 0 refills | Status: DC

## 2022-09-13 MED ORDER — BENZONATATE 200 MG PO CAPS: 200.0000 mg | ORAL_CAPSULE | Freq: Two times a day (BID) | ORAL | 0 refills | Status: DC | PRN

## 2022-09-13 NOTE — Assessment & Plan Note (Signed)
Prednisone x 5 days and Z pack

## 2022-09-13 NOTE — Patient Instructions (Signed)
Follow-up with Dr. Posey Pronto as before, call if you need to be seen sooner.  You are treated today for acute bronchitis and sinusitis.  Swab to be sent for COVID RSV and influenza.  An antibiotic is azithromycin, Tessalon Perles, prednisone, and a cough suppressant syrup are all prescribed and are at your pharmacy.  Please get your chest x-ray at the hospital today.  Work excuse from today to return on Monday, February 26  Thanks for choosing [facility], we consider it a privelige to serve you.'

## 2022-09-13 NOTE — Progress Notes (Signed)
    Brenda Taylor     MRN: [id number]      DOB: [birth year]   HPI Ms. Woolen c/o 2 day history of acute onset of frontal headache excessive nasal congestion excessive cough which is nonproductive chills body aches and fatigue.  States her legs feel like jelly  as if she can barely stand.  Positive sick exposure with her granddaughter 4 days ago. She has had  the flu vaccine and she is a chronic smoker.    ROS Denies chest pains, palpitations and leg swelling Denies abdominal pain, nausea, vomiting,diarrhea or constipation.   Denies dysuria, frequency, hesitancy or incontinence. Denies joint pain, swelling and limitation in mobility. Denies headaches, seizures, numbness, or tingling. Denies depression, anxiety or insomnia. Denies skin break down or rash.   PE  BP 101/66 (BP Location: Right Arm, Patient Position: Sitting, Cuff Size: Large)   Pulse 95   Temp 98.1 F (36.7 C)   Ht 5' 5"$  (1.651 m)   Wt 279 lb (126.6 kg)   SpO2 95%   BMI 46.43 kg/m   Patient ill appearing Nasal and sinmus congestion , and cough HEENT: No facial asymmetry, EOMI,     Neck supple .Bilateral anterior cervical adenitis, frontal and maxillary sinus tenderness, TM clear ilaterally  Chest: Decreased air enry, scattered crackles , no wheezes CVS: S1, S2 no murmurs, no S3.Regular rate.  ABD: Soft non tender.   Ext: No edema  MS: Adequate ROM spine, shoulders, hips and knees.  Skin: Intact, no ulcerations or rash noted.  Psych: Good eye contact, normal affect. Memory intact not anxious or depressed appearing.  CNS: CN 2-12 intact, power,  normal throughout.no focal deficits noted.   Assessment & Plan  Acute cough cXR, treat with azithromycin, tessalon perles , phenergan DM and short course of prednisone Test for covid, RSv and flu Work excuse to return 09/18/2022  Acute sinusitis Prednisone x 5 days and Z pack  Hypertension Controlled, no change in medication

## 2022-09-13 NOTE — Assessment & Plan Note (Signed)
cXR, treat with azithromycin, tessalon perles , phenergan DM and short course of prednisone Test for covid, RSv and flu Work excuse to return 09/18/2022

## 2022-09-13 NOTE — Assessment & Plan Note (Signed)
Controlled, no change in medication  

## 2022-09-17 ENCOUNTER — Telehealth: Payer: Self-pay | Admitting: Internal Medicine

## 2022-09-18 ENCOUNTER — Telehealth: Payer: Self-pay | Admitting: Family Medicine

## 2022-09-18 NOTE — Telephone Encounter (Signed)
Patient called for her covid results, seen Dr Moshe Cipro 02.22.2024 has not heard back yet.

## 2022-09-19 ENCOUNTER — Other Ambulatory Visit: Payer: Self-pay | Admitting: Internal Medicine

## 2022-09-19 DIAGNOSIS — J0140 Acute pansinusitis, unspecified: Secondary | ICD-10-CM

## 2022-09-19 LAB — COVID-19, FLU A+B AND RSV
Influenza A, NAA: NOT DETECTED
Influenza B, NAA: NOT DETECTED
RSV, NAA: NOT DETECTED
SARS-CoV-2, NAA: NOT DETECTED

## 2022-09-19 MED ORDER — AZITHROMYCIN 250 MG PO TABS: ORAL_TABLET | ORAL | 0 refills | Status: AC

## 2022-09-19 NOTE — Telephone Encounter (Signed)
Patient aware.

## 2022-09-19 NOTE — Telephone Encounter (Signed)
Error

## 2022-09-20 ENCOUNTER — Encounter: Payer: Self-pay | Admitting: Radiology

## 2022-10-08 ENCOUNTER — Other Ambulatory Visit: Payer: Self-pay | Admitting: Internal Medicine

## 2022-10-08 DIAGNOSIS — I10 Essential (primary) hypertension: Secondary | ICD-10-CM

## 2022-10-31 ENCOUNTER — Other Ambulatory Visit: Payer: Self-pay | Admitting: Internal Medicine

## 2022-11-07 ENCOUNTER — Other Ambulatory Visit: Payer: Self-pay | Admitting: Internal Medicine

## 2022-11-07 DIAGNOSIS — F419 Anxiety disorder, unspecified: Secondary | ICD-10-CM

## 2022-11-07 DIAGNOSIS — I10 Essential (primary) hypertension: Secondary | ICD-10-CM

## 2022-11-07 DIAGNOSIS — M5431 Sciatica, right side: Secondary | ICD-10-CM

## 2022-11-12 ENCOUNTER — Ambulatory Visit: Payer: 59 | Admitting: Internal Medicine

## 2022-11-13 ENCOUNTER — Ambulatory Visit: Payer: Medicaid Other | Admitting: Internal Medicine

## 2022-11-13 ENCOUNTER — Encounter: Payer: Self-pay | Admitting: Internal Medicine

## 2022-11-13 VITALS — BP 111/72 | HR 89 | Ht 65.0 in | Wt 283.0 lb

## 2022-11-13 DIAGNOSIS — I10 Essential (primary) hypertension: Secondary | ICD-10-CM

## 2022-11-13 DIAGNOSIS — M5431 Sciatica, right side: Secondary | ICD-10-CM | POA: Diagnosis not present

## 2022-11-13 DIAGNOSIS — F411 Generalized anxiety disorder: Secondary | ICD-10-CM

## 2022-11-13 DIAGNOSIS — K5904 Chronic idiopathic constipation: Secondary | ICD-10-CM | POA: Diagnosis not present

## 2022-11-13 DIAGNOSIS — K219 Gastro-esophageal reflux disease without esophagitis: Secondary | ICD-10-CM

## 2022-11-13 DIAGNOSIS — M5432 Sciatica, left side: Secondary | ICD-10-CM | POA: Diagnosis not present

## 2022-11-13 DIAGNOSIS — B070 Plantar wart: Secondary | ICD-10-CM

## 2022-11-13 DIAGNOSIS — G609 Hereditary and idiopathic neuropathy, unspecified: Secondary | ICD-10-CM | POA: Diagnosis not present

## 2022-11-13 DIAGNOSIS — J410 Simple chronic bronchitis: Secondary | ICD-10-CM

## 2022-11-13 DIAGNOSIS — Z114 Encounter for screening for human immunodeficiency virus [HIV]: Secondary | ICD-10-CM

## 2022-11-13 DIAGNOSIS — Z720 Tobacco use: Secondary | ICD-10-CM

## 2022-11-13 MED ORDER — BUSPIRONE HCL 15 MG PO TABS
15.0000 mg | ORAL_TABLET | Freq: Every day | ORAL | 3 refills | Status: DC
Start: 2022-11-13 — End: 2023-05-02

## 2022-11-13 MED ORDER — HYDROCHLOROTHIAZIDE 25 MG PO TABS: 25.0000 mg | ORAL_TABLET | Freq: Every day | ORAL | 5 refills | Status: DC

## 2022-11-13 MED ORDER — KETOROLAC TROMETHAMINE 60 MG/2ML IM SOLN
60.0000 mg | Freq: Once | INTRAMUSCULAR | Status: AC
Start: 2022-11-13 — End: 2022-11-13
  Administered 2022-11-13: 60 mg via INTRAMUSCULAR

## 2022-11-13 MED ORDER — BENZONATATE 200 MG PO CAPS
200.0000 mg | ORAL_CAPSULE | Freq: Two times a day (BID) | ORAL | 1 refills | Status: DC | PRN
Start: 2022-11-13 — End: 2022-12-12

## 2022-11-13 MED ORDER — LISINOPRIL 40 MG PO TABS: 40.0000 mg | ORAL_TABLET | Freq: Every day | ORAL | 5 refills | Status: DC

## 2022-11-13 MED ORDER — CYCLOBENZAPRINE HCL 10 MG PO TABS
10.0000 mg | ORAL_TABLET | Freq: Every day | ORAL | 0 refills | Status: DC
Start: 2022-11-13 — End: 2023-01-01

## 2022-11-13 NOTE — Assessment & Plan Note (Signed)
BP: 111/72   Well-controlled with lisinopril and HCTZ Advised to stay compliant with medications Advised DASH diet and moderate exercise as tolerated F/u CMP and lipid profile

## 2022-11-13 NOTE — Assessment & Plan Note (Signed)
Needs to quit smoking Has chronic cough, takes Tessalon as needed Denies dyspnea or wheezing currently

## 2022-11-13 NOTE — Patient Instructions (Signed)
Please take Flexeril 10 mg instead of 5 mg.  Please continue to take medications as prescribed.  Please continue to follow low carb diet and perform moderate exercise/walking at least 150 mins/week.

## 2022-11-13 NOTE — Assessment & Plan Note (Signed)
Smokes about 0.5 pack/day  Asked about quitting: confirms that she currently smokes cigarettes. Advise to quit smoking: Educated about QUITTING to reduce the risk of cancer, cardio and cerebrovascular disease. Assess willingness: Unwilling to quit at this time, but is working on cutting back. Assist with counseling and pharmacotherapy: Counseled for 5 minutes and literature provided. Arrange for follow up: follow up in 3 months and continue to offer help. 

## 2022-11-13 NOTE — Assessment & Plan Note (Signed)
On Trulance 

## 2022-11-13 NOTE — Assessment & Plan Note (Signed)
Advised to use Compound W

## 2022-11-13 NOTE — Assessment & Plan Note (Addendum)
Slightly better with Gabapentin 300 mg TID, but still has numbness of legs Also has leg cramps better with walking, concern for RLS - increase dose of Gabapentin to 300 mg TID in the last visit, now better

## 2022-11-13 NOTE — Assessment & Plan Note (Addendum)
Acute on chronic low back pain radiating to LLE, has had right sided sciatica in the past as well Toradol IM today Flexeril as needed for muscle spasms, increased dose to 10 mg qHS Avoid heavy lifting and frequent bending Simple back exercises

## 2022-11-13 NOTE — Progress Notes (Signed)
Established Patient Office Visit  Subjective:  Patient ID: Brenda Taylor, female    DOB: 02-28-1971  Age: 52 y.o. MRN: [id number]  CC:  Chief Complaint  Patient presents with   Peripheral Neuropathy    Patient feels she has neuropathy and has spots on her feet. Patient states her leg burns, she can not stand on it for long periods.     HPI Brenda Taylor is a 52 y.o. female with past medical history of hypertension, anxiety and colonic polyp who presents for f/u of her chronic medical conditions.  HTN: BP is well-controlled. Takes medications regularly. Patient denies headache, dizziness, chest pain, dyspnea or palpitations.  She complains of left hip, buttock area pain, constant, sharp, radiating to the posterior and lateral thigh area for the last 2 weeks.  Pain is worse upon prolonged standing at her workplace.  She also reports burning pain in her feet.  She has been taking gabapentin 300 mg TID. She still c/o b/l leg pain, which is worse at nighttime. Her pain gets slightly better with walking. She also reports leg cramps at nighttime, but him being better since increasing dose of gabapentin to 300 mg. Denies any fall or recent injury.  Anxiety: Now well-controlled with Xanax and Buspar. Denies any anhedonia, SI or HI.   Past Medical History:  Diagnosis Date   Anxiety    Chronic pain    DDD (degenerative disc disease)    GERD (gastroesophageal reflux disease)    HBP (high blood pressure)    Headache    Hernia    Multiple gastric ulcers    Pre-diabetes    Rotator cuff syndrome of left shoulder 10/09/2011   Ulcer disease     Past Surgical History:  Procedure Laterality Date   APPENDECTOMY     BACK SURGERY     x5   BIOPSY  05/04/2021   Procedure: BIOPSY;  Surgeon: Lanelle Bal, DO;  [facility]: [facility];  Service: Endoscopy;;   BREAST SURGERY     CARPAL TUNNEL RELEASE     bilateral   CHOLECYSTECTOMY     COLONOSCOPY  10/20/2009   WUJ:WJXBJYN anal  canal/left-side diverticula and multiple polyps (hyperplastic). poor prep compromised exam. Next TCS 09/2014.   COLONOSCOPY WITH PROPOFOL N/A 05/04/2021   Procedure: COLONOSCOPY WITH PROPOFOL;  Surgeon: Lanelle Bal, DO;  [facility]: [facility];  Service: Endoscopy;  Laterality: N/A;  9:45am   ESOPHAGOGASTRODUODENOSCOPY  03/06/2010   WGN:FAOZ quadrant distal esophageal/patent tubular esophagus/small HH, antral erosions and ulcerations. Bx negative for persistent H.pylori   ESOPHAGOGASTRODUODENOSCOPY  10/20/2009   HYQ:MVHQI HH/prepyloric antral ulcer s/p bx (+h.pylori), ERE   ESOPHAGOGASTRODUODENOSCOPY (EGD) WITH PROPOFOL N/A 05/04/2021   Procedure: ESOPHAGOGASTRODUODENOSCOPY (EGD) WITH PROPOFOL;  Surgeon: Lanelle Bal, DO;  [facility]: [facility];  Service: Endoscopy;  Laterality: N/A;   INTRAUTERINE DEVICE INSERTION     LEEP N/A 02/14/2021   Procedure: LOOP ELECTROSURGICAL EXCISION PROCEDURE (LEEP);  Surgeon: Myna Hidalgo, DO;  Location: [facility];  Service: Gynecology;  Laterality: N/A;   MASTECTOMY, PARTIAL Left    x 2   MOUTH SURGERY     POLYPECTOMY  05/04/2021   Procedure: POLYPECTOMY;  Surgeon: Lanelle Bal, DO;  [facility]: [facility];  Service: Endoscopy;;   ROTATOR CUFF REPAIR     right   TUBAL LIGATION      Family History  Problem Relation Age of Onset   Heart disease Other    Arthritis Other  Cancer Other    Asthma Other    Diabetes Other    Heart disease Mother    Diabetes Mother    Cancer Mother    Non-Hodgkin's lymphoma Mother    Hypertension Mother    Arthritis Mother    Lung cancer Father    Neuropathy Father    Hypertension Father     Social History   Socioeconomic History   Marital status: Widowed    Spouse name: Not on file   Number of children: Not on file   Years of education: 9   Highest education level: Not on file  Occupational History   Occupation: Conservation officer, nature  Tobacco Use   Smoking status: Every Day    Packs/day: .5     Types: Cigarettes    Passive exposure: Current   Smokeless tobacco: Never  Vaping Use   Vaping Use: Never used  Substance and Sexual Activity   Alcohol use: No   Drug use: Yes    Frequency: 7.0 times per week    Types: Marijuana    Comment: twice daily   Sexual activity: Yes    Birth control/protection: I.U.D.  Other Topics Concern   Not on file  Social History Narrative   Not on file   Social Determinants of Health   Financial Resource Strain: Medium Risk (12/26/2020)   Overall Financial Resource Strain (CARDIA)    Difficulty of Paying Living Expenses: Somewhat hard  Food Insecurity: No Food Insecurity (12/26/2020)   Hunger Vital Sign    Worried About Running Out of Food in the Last Year: Never true    Ran Out of Food in the Last Year: Never true  Transportation Needs: No Transportation Needs (12/26/2020)   PRAPARE - Administrator, Civil Service (Medical): No    Lack of Transportation (Non-Medical): No  Physical Activity: Inactive (12/26/2020)   Exercise Vital Sign    Days of Exercise per Week: 0 days    Minutes of Exercise per Session: 0 min  Stress: Stress Concern Present (12/26/2020)   [employer] of Occupational Health - Occupational Stress Questionnaire    Feeling of Stress : To some extent  Social Connections: Socially Isolated (12/26/2020)   Social Connection and Isolation Panel [NHANES]    Frequency of Communication with Friends and Family: Three times a week    Frequency of Social Gatherings with Friends and Family: Once a week    Attends Religious Services: Never    Database administrator or Organizations: Not on file    Attends Banker Meetings: Never    Marital Status: Widowed  Intimate Partner Violence: Not At Risk (12/26/2020)   Humiliation, Afraid, Rape, and Kick questionnaire    Fear of Current or Ex-Partner: No    Emotionally Abused: No    Physically Abused: No    Sexually Abused: No    Outpatient Medications Prior to Visit   Medication Sig Dispense Refill   ALPRAZolam (XANAX) 0.5 MG tablet TAKE ONE TABLET BY MOUTH AT BEDTIME AS NEEDED FOR ANXIETY. 30 tablet 2   gabapentin (NEURONTIN) 300 MG capsule Take 1 capsule (300 mg total) by mouth 3 (three) times daily. 270 capsule 1   pantoprazole (PROTONIX) 40 MG tablet Take 1 tablet (40 mg total) by mouth 2 (two) times daily before a meal. 60 tablet 11   Plecanatide (TRULANCE) 3 MG TABS Take 3 mg by mouth daily. 90 tablet 3   benzonatate (TESSALON) 200 MG capsule Take 1 capsule (200 mg  total) by mouth 2 (two) times daily as needed for cough. 30 capsule 0   busPIRone (BUSPAR) 15 MG tablet TAKE 1 TABLET BY MOUTH ONCE DAILY. 30 tablet 0   cyclobenzaprine (FLEXERIL) 5 MG tablet TAKE (1) TABLET BY MOUTH TWICE A DAY AS NEEDED. 30 tablet 0   hydrochlorothiazide (HYDRODIURIL) 25 MG tablet TAKE 1 TABLET BY MOUTH ONCE A DAY. 30 tablet 0   lisinopril (ZESTRIL) 40 MG tablet TAKE ONE TABLET BY MOUTH ONCE DAILY. 30 tablet 0   predniSONE (DELTASONE) 10 MG tablet Take 1 tablet by mouth 3 times daily for 2 days, then take 1 tablet twice daily for 2 days, then take 1 tablet once daily for 3 days then stop. 13 tablet 0   promethazine-dextromethorphan (PROMETHAZINE-DM) 6.25-15 MG/5ML syrup Take 1 teaspoon by mouth twice daily as needed for excessive cough. 180 mL 0   No facility-administered medications prior to visit.    Allergies  Allergen Reactions   Amoxicillin Hives   Aspirin     Cannot take due to ulcers   Nsaids     Cannot take due to ulcers     ROS Review of Systems  Constitutional:  Negative for chills and fever.  HENT:  Positive for hearing loss (Right sided). Negative for congestion, postnasal drip, sinus pressure, sinus pain and sore throat.   Eyes:  Negative for pain and discharge.  Respiratory:  Negative for cough and shortness of breath.   Cardiovascular:  Negative for chest pain and palpitations.  Gastrointestinal:  Negative for abdominal pain, diarrhea, nausea and  vomiting.  Endocrine: Negative for polydipsia and polyuria.  Genitourinary:  Negative for dysuria and hematuria.  Musculoskeletal:  Positive for arthralgias. Negative for neck pain and neck stiffness.  Skin:  Negative for rash.  Neurological:  Positive for numbness. Negative for dizziness, seizures, syncope, speech difficulty and light-headedness.  Psychiatric/Behavioral:  Positive for sleep disturbance. Negative for agitation and behavioral problems. The patient is nervous/anxious.       Objective:    Physical Exam Vitals reviewed.  Constitutional:      General: She is not in acute distress.    Appearance: She is obese. She is not diaphoretic.  HENT:     Head: Normocephalic and atraumatic.     Nose: Nose normal. No congestion.     Mouth/Throat:     Mouth: Mucous membranes are moist.     Pharynx: No posterior oropharyngeal erythema.  Eyes:     General: No scleral icterus.    Extraocular Movements: Extraocular movements intact.  Neck:     Vascular: No carotid bruit.  Cardiovascular:     Rate and Rhythm: Normal rate and regular rhythm.     Pulses: Normal pulses.     Heart sounds: Normal heart sounds. No murmur heard. Pulmonary:     Breath sounds: Normal breath sounds. No wheezing or rales.  Abdominal:     Palpations: Abdomen is soft.     Tenderness: There is no abdominal tenderness.  Musculoskeletal:        General: Tenderness (MCP joints of index and middle finger of left hand) present.     Cervical back: Neck supple. No tenderness.     Right lower leg: No edema.     Left lower leg: No edema.  Skin:    General: Skin is warm.     Findings: No rash.  Neurological:     General: No focal deficit present.     Mental Status: She is alert and oriented  to person, place, and time.     Sensory: No sensory deficit.     Motor: No weakness.  Psychiatric:        Mood and Affect: Mood normal.        Behavior: Behavior normal.     BP 111/72 (BP Location: Right Arm, Patient  Position: Sitting, Cuff Size: Large)   Pulse 89   Ht 5\' 5"  (1.651 m)   Wt 283 lb (128.4 kg)   SpO2 94%   BMI 47.09 kg/m  Wt Readings from Last 3 Encounters:  11/13/22 283 lb (128.4 kg)  09/13/22 279 lb (126.6 kg)  07/10/22 275 lb 3.2 oz (124.8 kg)    Lab Results  Component Value Date   TSH 1.730 09/14/2020   Lab Results  Component Value Date   WBC 11.2 (H) 05/15/2021   HGB 11.7 05/15/2021   HCT 34.3 05/15/2021   MCV 89 05/15/2021   PLT 274 05/15/2021   Lab Results  Component Value Date   NA 139 08/31/2021   K 3.8 08/31/2021   CO2 21 08/31/2021   GLUCOSE 123 (H) 08/31/2021   BUN 24 08/31/2021   CREATININE 0.96 08/31/2021   BILITOT <0.2 05/15/2021   ALKPHOS 95 05/15/2021   AST 16 05/15/2021   ALT 14 05/15/2021   PROT 7.4 05/15/2021   ALBUMIN 4.3 05/15/2021   CALCIUM 10.0 08/31/2021   ANIONGAP 7 04/25/2021   EGFR 71 08/31/2021   Lab Results  Component Value Date   CHOL 156 05/15/2021   Lab Results  Component Value Date   HDL 31 (L) 05/15/2021   Lab Results  Component Value Date   LDLCALC 105 (H) 05/15/2021   Lab Results  Component Value Date   TRIG 110 05/15/2021   Lab Results  Component Value Date   CHOLHDL 5.0 (H) 05/15/2021   Lab Results  Component Value Date   HGBA1C 6.0 (H) 05/15/2021      Assessment & Plan:   Problem List Items Addressed This Visit       Cardiovascular and Mediastinum   Hypertension - Primary    BP: 111/72   Well-controlled with lisinopril and HCTZ Advised to stay compliant with medications Advised DASH diet and moderate exercise as tolerated F/u CMP and lipid profile      Relevant Medications   hydrochlorothiazide (HYDRODIURIL) 25 MG tablet   lisinopril (ZESTRIL) 40 MG tablet     Respiratory   Simple chronic bronchitis    Needs to quit smoking Has chronic cough, takes Tessalon as needed Denies dyspnea or wheezing currently      Relevant Medications   benzonatate (TESSALON) 200 MG capsule      Digestive   Chronic idiopathic constipation    On Trulance      Gastroesophageal reflux disease without esophagitis    On Protonix 40 mg BID, followed by GI        Nervous and Auditory   Idiopathic peripheral neuropathy    Slightly better with Gabapentin 300 mg TID, but still has numbness of legs Also has leg cramps better with walking, concern for RLS - increase dose of Gabapentin to 300 mg TID in the last visit, now better      Relevant Medications   cyclobenzaprine (FLEXERIL) 10 MG tablet   busPIRone (BUSPAR) 15 MG tablet   Sciatica    Acute on chronic low back pain radiating to LLE, has had right sided sciatica in the past as well Toradol IM today Flexeril as needed for  muscle spasms, increased dose to 10 mg qHS Avoid heavy lifting and frequent bending Simple back exercises      Relevant Medications   cyclobenzaprine (FLEXERIL) 10 MG tablet   busPIRone (BUSPAR) 15 MG tablet     Musculoskeletal and Integument   Plantar wart of both feet    Advised to use Compound W        Other   GAD (generalized anxiety disorder)    Well-controlled with BuSpar and Xanax Advised to avoid marijuana products      Relevant Medications   busPIRone (BUSPAR) 15 MG tablet   Tobacco abuse    Smokes about 0.5 pack/day  Asked about quitting: confirms that she currently smokes cigarettes Advise to quit smoking: Educated about QUITTING to reduce the risk of cancer, cardio and cerebrovascular disease. Assess willingness: Unwilling to quit at this time, but is working on cutting back. Assist with counseling and pharmacotherapy: Counseled for 5 minutes and literature provided. Arrange for follow up: follow up in 3 months and continue to offer help.      Other Visit Diagnoses     Screening for HIV (human immunodeficiency virus)       Relevant Orders   HIV antibody (with reflex)      Meds ordered this encounter  Medications   cyclobenzaprine (FLEXERIL) 10 MG tablet    Sig: Take 1  tablet (10 mg total) by mouth at bedtime.    Dispense:  30 tablet    Refill:  0   ketorolac (TORADOL) injection 60 mg   benzonatate (TESSALON) 200 MG capsule    Sig: Take 1 capsule (200 mg total) by mouth 2 (two) times daily as needed for cough.    Dispense:  30 capsule    Refill:  1   busPIRone (BUSPAR) 15 MG tablet    Sig: Take 1 tablet (15 mg total) by mouth daily.    Dispense:  30 tablet    Refill:  3   hydrochlorothiazide (HYDRODIURIL) 25 MG tablet    Sig: Take 1 tablet (25 mg total) by mouth daily.    Dispense:  30 tablet    Refill:  5   lisinopril (ZESTRIL) 40 MG tablet    Sig: Take 1 tablet (40 mg total) by mouth daily.    Dispense:  30 tablet    Refill:  5    Follow-up: Return in about 3 months (around 02/12/2023) for GAD and HTN.    Anabel Halon, MD

## 2022-11-13 NOTE — Assessment & Plan Note (Signed)
Well-controlled with BuSpar and Xanax Advised to avoid marijuana products

## 2022-11-13 NOTE — Assessment & Plan Note (Signed)
On Protonix 40 mg BID, followed by GI 

## 2022-11-14 ENCOUNTER — Other Ambulatory Visit: Payer: Self-pay | Admitting: Internal Medicine

## 2022-11-14 DIAGNOSIS — E119 Type 2 diabetes mellitus without complications: Secondary | ICD-10-CM | POA: Insufficient documentation

## 2022-11-14 DIAGNOSIS — E782 Mixed hyperlipidemia: Secondary | ICD-10-CM

## 2022-11-14 DIAGNOSIS — E1169 Type 2 diabetes mellitus with other specified complication: Secondary | ICD-10-CM | POA: Insufficient documentation

## 2022-11-14 LAB — HEPATITIS C ANTIBODY: Hep C Virus Ab: NONREACTIVE

## 2022-11-14 LAB — HIV ANTIBODY (ROUTINE TESTING W REFLEX): HIV Screen 4th Generation wRfx: NONREACTIVE

## 2022-11-14 MED ORDER — ROSUVASTATIN CALCIUM 5 MG PO TABS
5.0000 mg | ORAL_TABLET | Freq: Every day | ORAL | 3 refills | Status: DC
Start: 2022-11-14 — End: 2023-11-11

## 2022-11-14 MED ORDER — METFORMIN HCL ER 500 MG PO TB24
500.0000 mg | ORAL_TABLET | Freq: Every day | ORAL | 3 refills | Status: DC
Start: 2022-11-14 — End: 2023-03-19

## 2022-11-15 LAB — CMP14+EGFR
ALT: 21 IU/L (ref 0–32)
AST: 18 IU/L (ref 0–40)
Albumin/Globulin Ratio: 1.2 (ref 1.2–2.2)
Albumin: 4 g/dL (ref 3.8–4.9)
Alkaline Phosphatase: 99 IU/L (ref 44–121)
BUN/Creatinine Ratio: 17 (ref 9–23)
BUN: 19 mg/dL (ref 6–24)
Bilirubin Total: <0.2 mg/dL (ref 0.0–1.2)
CO2: 20 mmol/L (ref 20–29)
Calcium: 9.3 mg/dL (ref 8.7–10.2)
Chloride: 103 mmol/L (ref 96–106)
Creatinine, Ser: 1.1 mg/dL — ABNORMAL HIGH (ref 0.57–1.00)
Globulin, Total: 3.3 g/dL (ref 1.5–4.5)
Glucose: 88 mg/dL (ref 70–99)
Potassium: 4 mmol/L (ref 3.5–5.2)
Sodium: 139 mmol/L (ref 134–144)
Total Protein: 7.3 g/dL (ref 6.0–8.5)
eGFR: 60 mL/min/{1.73_m2} (ref >59)

## 2022-11-15 LAB — TSH: TSH: 2.51 u[IU]/mL (ref 0.450–4.500)

## 2022-11-15 LAB — CBC WITH DIFFERENTIAL/PLATELET
Basophils Absolute: 0.1 10*3/uL (ref 0.0–0.2)
Basos: 1 %
EOS (ABSOLUTE): 0.3 10*3/uL (ref 0.0–0.4)
Eos: 3 %
Hematocrit: 35.5 % (ref 34.0–46.6)
Hemoglobin: 12 g/dL (ref 11.1–15.9)
Immature Grans (Abs): 0.1 10*3/uL (ref 0.0–0.1)
Immature Granulocytes: 1 %
Lymphocytes Absolute: 4.1 10*3/uL — ABNORMAL HIGH (ref 0.7–3.1)
Lymphs: 35 %
MCH: 29.8 pg (ref 26.6–33.0)
MCHC: 33.8 g/dL (ref 31.5–35.7)
MCV: 88 fL (ref 79–97)
Monocytes Absolute: 0.9 10*3/uL (ref 0.1–0.9)
Monocytes: 8 %
Neutrophils Absolute: 6.3 10*3/uL (ref 1.4–7.0)
Neutrophils: 52 %
Platelets: 317 10*3/uL (ref 150–450)
RBC: 4.03 x10E6/uL (ref 3.77–5.28)
RDW: 13.3 % (ref 11.7–15.4)
WBC: 11.7 10*3/uL — ABNORMAL HIGH (ref 3.4–10.8)

## 2022-11-15 LAB — LIPID PANEL
Chol/HDL Ratio: 5.3 ratio — ABNORMAL HIGH (ref 0.0–4.4)
Cholesterol, Total: 155 mg/dL (ref 100–199)
HDL: 29 mg/dL — ABNORMAL LOW (ref >39)
LDL Chol Calc (NIH): 103 mg/dL — ABNORMAL HIGH (ref 0–99)
Triglycerides: 125 mg/dL (ref 0–149)
VLDL Cholesterol Cal: 23 mg/dL (ref 5–40)

## 2022-11-15 LAB — VITAMIN D 25 HYDROXY (VIT D DEFICIENCY, FRACTURES): Vit D, 25-Hydroxy: 18.5 ng/mL — ABNORMAL LOW (ref 30.0–100.0)

## 2022-11-15 LAB — HEMOGLOBIN A1C
Est. average glucose Bld gHb Est-mCnc: 140 mg/dL
Hgb A1c MFr Bld: 6.5 % — ABNORMAL HIGH (ref 4.8–5.6)

## 2022-12-12 ENCOUNTER — Other Ambulatory Visit: Payer: Self-pay | Admitting: Family Medicine

## 2022-12-12 DIAGNOSIS — J410 Simple chronic bronchitis: Secondary | ICD-10-CM

## 2023-01-01 ENCOUNTER — Ambulatory Visit: Payer: Medicaid Other | Admitting: Internal Medicine

## 2023-01-01 ENCOUNTER — Encounter: Payer: Self-pay | Admitting: Internal Medicine

## 2023-01-01 VITALS — BP 112/74 | HR 103 | Ht 65.0 in | Wt 274.6 lb

## 2023-01-01 DIAGNOSIS — H1013 Acute atopic conjunctivitis, bilateral: Secondary | ICD-10-CM | POA: Diagnosis not present

## 2023-01-01 DIAGNOSIS — E1142 Type 2 diabetes mellitus with diabetic polyneuropathy: Secondary | ICD-10-CM

## 2023-01-01 DIAGNOSIS — G609 Hereditary and idiopathic neuropathy, unspecified: Secondary | ICD-10-CM

## 2023-01-01 DIAGNOSIS — G5712 Meralgia paresthetica, left lower limb: Secondary | ICD-10-CM | POA: Insufficient documentation

## 2023-01-01 MED ORDER — PREDNISONE 20 MG PO TABS
20.0000 mg | ORAL_TABLET | Freq: Every day | ORAL | 0 refills | Status: DC
Start: 2023-01-01 — End: 2023-01-31

## 2023-01-01 MED ORDER — OLOPATADINE HCL 0.1 % OP SOLN
1.0000 [drp] | Freq: Two times a day (BID) | OPHTHALMIC | 1 refills | Status: DC
Start: 2023-01-01 — End: 2023-12-31

## 2023-01-01 MED ORDER — GABAPENTIN 400 MG PO CAPS: 400.0000 mg | ORAL_CAPSULE | Freq: Three times a day (TID) | ORAL | 1 refills | Status: DC

## 2023-01-01 NOTE — Patient Instructions (Signed)
Please start taking Gabapentin 400 mg 3 times daily.  Please avoid heavy lifting and frequent bending.

## 2023-01-02 ENCOUNTER — Other Ambulatory Visit (INDEPENDENT_AMBULATORY_CARE_PROVIDER_SITE_OTHER): Payer: Medicaid Other

## 2023-01-02 ENCOUNTER — Encounter: Payer: Self-pay | Admitting: Orthopedic Surgery

## 2023-01-02 ENCOUNTER — Ambulatory Visit: Payer: Medicaid Other | Admitting: Orthopedic Surgery

## 2023-01-02 VITALS — BP 120/70 | HR 86 | Ht 65.0 in | Wt 274.0 lb

## 2023-01-02 DIAGNOSIS — M545 Low back pain, unspecified: Secondary | ICD-10-CM | POA: Diagnosis not present

## 2023-01-02 DIAGNOSIS — M79652 Pain in left thigh: Secondary | ICD-10-CM

## 2023-01-02 DIAGNOSIS — H1013 Acute atopic conjunctivitis, bilateral: Secondary | ICD-10-CM | POA: Insufficient documentation

## 2023-01-02 NOTE — Assessment & Plan Note (Signed)
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

## 2023-01-02 NOTE — Assessment & Plan Note (Signed)
Her left thigh area burning pain is suggestive of meralgia paresthetica Unable to take NSAIDs due to GERD Increased dose of gabapentin to 400 mg 3 times daily Prednisone 20 mg QD X 5 days Referred to orthopedic surgeon

## 2023-01-02 NOTE — Assessment & Plan Note (Signed)
Lab Results  Component Value Date   HGBA1C 6.5 (H) 11/13/2022   New onset On metformin 500 mg QD Advised to follow diabetic diet On ACEi and statin F/u CMP and lipid panel Diabetic foot exam: Today Diabetic eye exam: Advised to follow up with Ophthalmology for diabetic eye exam

## 2023-01-02 NOTE — Progress Notes (Signed)
New Patient Visit  Assessment: Brenda Taylor is a 52 y.o. female with the following: 1. Pain of left thigh  Plan: Roderick Pee Youngman burning type pains, decreased sensation and tingling in the left lateral thigh, consistent with irritation of the lateral femoral cutaneous nerve.  She also has some numbness and tingling in the both feet, which could be diabetic neuropathy, or irritation of the nerves from her lumbar spine.  On physical exam, she has some weakness in the left lower extremity.  She also has decreased sensation to the mid tibia area.  Dr. Allena Katz started her on prednisone and increased her dose of gabapentin yesterday.  She notes some improvements in her symptoms.  These medicines are appropriate.  We discussed returning to see her spine surgeons, she reports she has had 5 surgeries total in her neck and her back.  At this point, she would like to try physical therapy.  We have placed a referral, and hopefully she can be seen quickly.  If therapy is not helpful, I recommended that she return to see her spine surgeon, we can refer her to see Dr. Christell Constant in [place].  Follow-up: Return if symptoms worsen or fail to improve.  Subjective:  Chief Complaint  Patient presents with   Leg Pain    L thigh burning sensation for 1 mo., that radiates down to calf and then her foot falls asleep. Pt states she's had 5 back surgeries(Last sx approx 11-12 yrs by Dr. Victorio Palm the past and has worked at [employer] over 25 yrs     History of Present Illness: Brenda Taylor is a 52 y.o. female who has been referred by  Trena Platt, MD for evaluation of left lateral thigh pain.  Approximately 6 weeks ago, she started to note some tingling sensations, associated with burning pains.  Occasionally, the pain will be severe enough, and she is unable to walk.  She feels unsteady.  She is also starting to have some numbness and tingling in both feet.  She was recently diagnosed as a diabetic, and may be dealing with  some neuropathy.  She has had multiple surgeries on her neck and her back.  Most recent surgery was over 10 years ago.  She denies pain in her back.  She was sent home from work recently because her manager was concerned that she might fall.  She feels unsteady on her feet.  She has not worked with physical therapy recently.   Review of Systems: No fevers or chills + numbness or tingling No chest pain No shortness of breath No bowel or bladder dysfunction No GI distress No headaches   Medical History:  Past Medical History:  Diagnosis Date   Anxiety    Chronic pain    DDD (degenerative disc disease)    GERD (gastroesophageal reflux disease)    HBP (high blood pressure)    Headache    Hernia    Multiple gastric ulcers    Pre-diabetes    Rotator cuff syndrome of left shoulder 10/09/2011   Ulcer disease     Past Surgical History:  Procedure Laterality Date   APPENDECTOMY     BACK SURGERY     x5   BIOPSY  05/04/2021   Procedure: BIOPSY;  Surgeon: Lanelle Bal, DO;  [facility];  Service: Endoscopy;;   BREAST SURGERY     CARPAL TUNNEL RELEASE     bilateral   CHOLECYSTECTOMY     COLONOSCOPY  10/20/2009  WUJ:WJXBJYN anal canal/left-side diverticula and multiple polyps (hyperplastic). poor prep compromised exam. Next TCS 09/2014.   COLONOSCOPY WITH PROPOFOL N/A 05/04/2021   Procedure: COLONOSCOPY WITH PROPOFOL;  Surgeon: Lanelle Bal, DO;  [facility];  Service: Endoscopy;  Laterality: N/A;  9:45am   ESOPHAGOGASTRODUODENOSCOPY  03/06/2010   WGN:FAOZ quadrant distal esophageal/patent tubular esophagus/small HH, antral erosions and ulcerations. Bx negative for persistent H.pylori   ESOPHAGOGASTRODUODENOSCOPY  10/20/2009   HYQ:MVHQI HH/prepyloric antral ulcer s/p bx (+h.pylori), ERE   ESOPHAGOGASTRODUODENOSCOPY (EGD) WITH PROPOFOL N/A 05/04/2021   Procedure: ESOPHAGOGASTRODUODENOSCOPY (EGD) WITH PROPOFOL;  Surgeon: Lanelle Bal, DO;   [facility];  Service: Endoscopy;  Laterality: N/A;   INTRAUTERINE DEVICE INSERTION     LEEP N/A 02/14/2021   Procedure: LOOP ELECTROSURGICAL EXCISION PROCEDURE (LEEP);  Surgeon: Myna Hidalgo, DO;  Location: [facility];  Service: Gynecology;  Laterality: N/A;   MASTECTOMY, PARTIAL Left    x 2   MOUTH SURGERY     POLYPECTOMY  05/04/2021   Procedure: POLYPECTOMY;  Surgeon: Lanelle Bal, DO;  [facility];  Service: Endoscopy;;   ROTATOR CUFF REPAIR     right   TUBAL LIGATION      Family History  Problem Relation Age of Onset   Heart disease Other    Arthritis Other    Cancer Other    Asthma Other    Diabetes Other    Heart disease Mother    Diabetes Mother    Cancer Mother    Non-Hodgkin's lymphoma Mother    Hypertension Mother    Arthritis Mother    Lung cancer Father    Neuropathy Father    Hypertension Father    Social History   Tobacco Use   Smoking status: Every Day    Packs/day: .5    Types: Cigarettes    Passive exposure: Current   Smokeless tobacco: Never  Vaping Use   Vaping Use: Never used  Substance Use Topics   Alcohol use: No   Drug use: Yes    Frequency: 7.0 times per week    Types: Marijuana    Comment: twice daily    Allergies  Allergen Reactions   Amoxicillin Hives   Aspirin     Cannot take due to ulcers   Nsaids     Cannot take due to ulcers     Current Meds  Medication Sig   ALPRAZolam (XANAX) 0.5 MG tablet TAKE ONE TABLET BY MOUTH AT BEDTIME AS NEEDED FOR ANXIETY.   benzonatate (TESSALON) 200 MG capsule TAKE 1 CAPSULE BY MOUTH 2 TIMES DAILY AS NEEDED FOR COUGH.   busPIRone (BUSPAR) 15 MG tablet Take 1 tablet (15 mg total) by mouth daily.   gabapentin (NEURONTIN) 400 MG capsule Take 1 capsule (400 mg total) by mouth 3 (three) times daily.   hydrochlorothiazide (HYDRODIURIL) 25 MG tablet Take 1 tablet (25 mg total) by mouth daily.   lisinopril (ZESTRIL) 40 MG tablet Take 1 tablet (40 mg total) by mouth daily.    metFORMIN (GLUCOPHAGE-XR) 500 MG 24 hr tablet Take 1 tablet (500 mg total) by mouth daily with breakfast.   olopatadine (PATADAY) 0.1 % ophthalmic solution Place 1 drop into both eyes 2 (two) times daily.   pantoprazole (PROTONIX) 40 MG tablet Take 1 tablet (40 mg total) by mouth 2 (two) times daily before a meal.   Plecanatide (TRULANCE) 3 MG TABS Take 3 mg by mouth daily.   predniSONE (DELTASONE) 20 MG tablet Take  1 tablet (20 mg total) by mouth daily with breakfast.   rosuvastatin (CRESTOR) 5 MG tablet Take 1 tablet (5 mg total) by mouth daily.    Objective: BP 120/70   Pulse 86   Ht 5\' 5"  (1.651 m)   Wt 274 lb (124.3 kg)   BMI 45.60 kg/m   Physical Exam:  General: Alert and oriented. and No acute distress. Gait: Left sided antalgic gait.  Evaluation of left leg demonstrates no deformity.  No obvious atrophy.  Decreased sensation in the left lateral thigh.  Decreased sensation from the mid tibia distal.  2+ patellar tendon reflexes.  4/5 strength left hip flexion, left knee extension, left knee flexion.  5/5 ankle dorsiflexion and great toe.  She also has some decreased sensation to the right foot.  IMAGING: I personally ordered and reviewed the following images   Standing lumbar spine x-rays were obtained in clinic today.  No acute injuries noted.  She has diffuse degenerative changes, with complete loss of joint space in the lower lumbar spine.  There are associated anterior based osteophytes.  No anterolisthesis.  No bony lesions.  Impression: Diffuse degenerative changes throughout the lumbar spine, without anterolisthesis or acute injury.   New Medications:  No orders of the defined types were placed in this encounter.     Oliver Barre, MD  01/02/2023 10:48 AM

## 2023-01-02 NOTE — Progress Notes (Signed)
Established Patient Office Visit  Subjective:  Patient ID: Brenda Taylor, female    DOB: 01-27-1971  Age: 52 y.o. MRN: [id number]  CC:  Chief Complaint  Patient presents with   Leg Pain    Patient is having left leg pain with burning sensation she has not been able to work the past couple of days.    HPI [name] is a 52 y.o. female with past medical history of hypertension, type 2 DM, anxiety, OA and colonic polyp who presents for f/u of c/o left thigh pain and f/u of DM.  She reports left lateral thigh area burning for the last 2 weeks, up from mid thigh up to the knee, which is worse with prolonged standing.  She has tried Tylenol without much relief.  She is currently taking gabapentin for diabetic neuropathy as well.  Denies any recent injury or fall.  She has history of lumbar spinal stenosis and has had multiple lumbar spine surgeries in the past.  Type II DM: She has started taking metformin for it.  Denies any polyuria or polyphagia currently.  She has chronic numbness and tingling of the bilateral feet.  She takes gabapentin 300 mg 3 times daily for it.  She also reports watering of the bilateral eyes and mild itching.  She works at [employer] and her itching is worse at her workplace.  Past Medical History:  Diagnosis Date   Anxiety    Chronic pain    DDD (degenerative disc disease)    GERD (gastroesophageal reflux disease)    HBP (high blood pressure)    Headache    Hernia    Multiple gastric ulcers    Pre-diabetes    Rotator cuff syndrome of left shoulder 10/09/2011   Ulcer disease     Past Surgical History:  Procedure Laterality Date   APPENDECTOMY     BACK SURGERY     x5   BIOPSY  05/04/2021   Procedure: BIOPSY;  Surgeon: Lanelle Bal, DO;  Location: [facility];  Service: Endoscopy;;   BREAST SURGERY     CARPAL TUNNEL RELEASE     bilateral   CHOLECYSTECTOMY     COLONOSCOPY  10/20/2009   WGN:FAOZHYQ anal canal/left-side diverticula and multiple  polyps (hyperplastic). poor prep compromised exam. Next TCS 09/2014.   COLONOSCOPY WITH PROPOFOL N/A 05/04/2021   Procedure: COLONOSCOPY WITH PROPOFOL;  Surgeon: Lanelle Bal, DO;  Location: [facility];  Service: Endoscopy;  Laterality: N/A;  9:45am   ESOPHAGOGASTRODUODENOSCOPY  03/06/2010   MVH:QION quadrant distal esophageal/patent tubular esophagus/small HH, antral erosions and ulcerations. Bx negative for persistent H.pylori   ESOPHAGOGASTRODUODENOSCOPY  10/20/2009   GEX:BMWUX HH/prepyloric antral ulcer s/p bx (+h.pylori), ERE   ESOPHAGOGASTRODUODENOSCOPY (EGD) WITH PROPOFOL N/A 05/04/2021   Procedure: ESOPHAGOGASTRODUODENOSCOPY (EGD) WITH PROPOFOL;  Surgeon: Lanelle Bal, DO;  Location: [facility];  Service: Endoscopy;  Laterality: N/A;   INTRAUTERINE DEVICE INSERTION     LEEP N/A 02/14/2021   Procedure: LOOP ELECTROSURGICAL EXCISION PROCEDURE (LEEP);  Surgeon: Myna Hidalgo, DO;  Location: [facility];  Service: Gynecology;  Laterality: N/A;   MASTECTOMY, PARTIAL Left    x 2   MOUTH SURGERY     POLYPECTOMY  05/04/2021   Procedure: POLYPECTOMY;  Surgeon: Lanelle Bal, DO;  Location: [facility];  Service: Endoscopy;;   ROTATOR CUFF REPAIR     right   TUBAL LIGATION      Family History  Problem Relation Age of Onset  Heart disease Other    Arthritis Other    Cancer Other    Asthma Other    Diabetes Other    Heart disease Mother    Diabetes Mother    Cancer Mother    Non-Hodgkin's lymphoma Mother    Hypertension Mother    Arthritis Mother    Lung cancer Father    Neuropathy Father    Hypertension Father     Social History   Socioeconomic History   Marital status: Widowed    Spouse name: Not on file   Number of children: Not on file   Years of education: 9   Highest education level: Not on file  Occupational History   Occupation: Conservation officer, nature  Tobacco Use   Smoking status: Every Day    Packs/day: .5    Types: Cigarettes    Passive exposure:  Current   Smokeless tobacco: Never  Vaping Use   Vaping Use: Never used  Substance and Sexual Activity   Alcohol use: No   Drug use: Yes    Frequency: 7.0 times per week    Types: Marijuana    Comment: twice daily   Sexual activity: Yes    Birth control/protection: I.U.D.  Other Topics Concern   Not on file  Social History Narrative   Not on file   Social Determinants of Health   Financial Resource Strain: Medium Risk (12/26/2020)   Overall Financial Resource Strain (CARDIA)    Difficulty of Paying Living Expenses: Somewhat hard  Food Insecurity: No Food Insecurity (12/26/2020)   Hunger Vital Sign    Worried About Running Out of Food in the Last Year: Never true    Ran Out of Food in the Last Year: Never true  Transportation Needs: No Transportation Needs (12/26/2020)   PRAPARE - Administrator, Civil Service (Medical): No    Lack of Transportation (Non-Medical): No  Physical Activity: Inactive (12/26/2020)   Exercise Vital Sign    Days of Exercise per Week: 0 days    Minutes of Exercise per Session: 0 min  Stress: Stress Concern Present (12/26/2020)   [employer] of Occupational Health - Occupational Stress Questionnaire    Feeling of Stress : To some extent  Social Connections: Socially Isolated (12/26/2020)   Social Connection and Isolation Panel [NHANES]    Frequency of Communication with Friends and Family: Three times a week    Frequency of Social Gatherings with Friends and Family: Once a week    Attends Religious Services: Never    Database administrator or Organizations: Not on file    Attends Banker Meetings: Never    Marital Status: Widowed  Intimate Partner Violence: Not At Risk (12/26/2020)   Humiliation, Afraid, Rape, and Kick questionnaire    Fear of Current or Ex-Partner: No    Emotionally Abused: No    Physically Abused: No    Sexually Abused: No    Outpatient Medications Prior to Visit  Medication Sig Dispense Refill    ALPRAZolam (XANAX) 0.5 MG tablet TAKE ONE TABLET BY MOUTH AT BEDTIME AS NEEDED FOR ANXIETY. 30 tablet 2   benzonatate (TESSALON) 200 MG capsule TAKE 1 CAPSULE BY MOUTH 2 TIMES DAILY AS NEEDED FOR COUGH. 30 capsule 0   busPIRone (BUSPAR) 15 MG tablet Take 1 tablet (15 mg total) by mouth daily. 30 tablet 3   hydrochlorothiazide (HYDRODIURIL) 25 MG tablet Take 1 tablet (25 mg total) by mouth daily. 30 tablet 5   lisinopril (ZESTRIL)  40 MG tablet Take 1 tablet (40 mg total) by mouth daily. 30 tablet 5   metFORMIN (GLUCOPHAGE-XR) 500 MG 24 hr tablet Take 1 tablet (500 mg total) by mouth daily with breakfast. 30 tablet 3   pantoprazole (PROTONIX) 40 MG tablet Take 1 tablet (40 mg total) by mouth 2 (two) times daily before a meal. 60 tablet 11   Plecanatide (TRULANCE) 3 MG TABS Take 3 mg by mouth daily. 90 tablet 3   rosuvastatin (CRESTOR) 5 MG tablet Take 1 tablet (5 mg total) by mouth daily. 90 tablet 3   cyclobenzaprine (FLEXERIL) 10 MG tablet Take 1 tablet (10 mg total) by mouth at bedtime. 30 tablet 0   gabapentin (NEURONTIN) 300 MG capsule Take 1 capsule (300 mg total) by mouth 3 (three) times daily. 270 capsule 1   No facility-administered medications prior to visit.    Allergies  Allergen Reactions   Amoxicillin Hives   Aspirin     Cannot take due to ulcers   Nsaids     Cannot take due to ulcers     ROS Review of Systems  Constitutional:  Negative for chills and fever.  HENT:  Negative for congestion, postnasal drip, sinus pressure, sinus pain and sore throat.   Eyes:  Positive for itching. Negative for pain.  Respiratory:  Negative for cough and shortness of breath.   Cardiovascular:  Negative for chest pain and palpitations.  Gastrointestinal:  Negative for abdominal pain, diarrhea, nausea and vomiting.  Endocrine: Negative for polydipsia and polyuria.  Genitourinary:  Negative for dysuria and hematuria.  Musculoskeletal:  Positive for arthralgias. Negative for neck pain and neck  stiffness.       Left thigh pain  Skin:  Negative for rash.  Neurological:  Positive for numbness (b/l feet). Negative for dizziness, seizures, syncope, speech difficulty and light-headedness.  Psychiatric/Behavioral:  Positive for sleep disturbance. Negative for agitation and behavioral problems. The patient is nervous/anxious.       Objective:    Physical Exam Vitals reviewed.  Constitutional:      General: She is not in acute distress.    Appearance: She is obese. She is not diaphoretic.  HENT:     Head: Normocephalic and atraumatic.     Nose: Nose normal. No congestion.     Mouth/Throat:     Mouth: Mucous membranes are moist.     Pharynx: No posterior oropharyngeal erythema.  Eyes:     General: No scleral icterus.       Right eye: Discharge (Clear) present.        Left eye: Discharge (Clear) present.    Extraocular Movements: Extraocular movements intact.  Neck:     Vascular: No carotid bruit.  Cardiovascular:     Rate and Rhythm: Normal rate and regular rhythm.     Pulses: Normal pulses.     Heart sounds: Normal heart sounds. No murmur heard. Pulmonary:     Breath sounds: Normal breath sounds. No wheezing or rales.  Musculoskeletal:        General: Tenderness (MCP joints of index and middle finger of left hand) present.     Cervical back: Neck supple. No tenderness.     Right lower leg: No edema.     Left lower leg: No edema.  Skin:    General: Skin is warm.     Findings: No rash.  Neurological:     General: No focal deficit present.     Mental Status: She is alert and oriented  to person, place, and time.     Sensory: Sensory deficit (b/l LE) present.     Motor: No weakness.  Psychiatric:        Mood and Affect: Mood normal.        Behavior: Behavior normal.     BP 112/74 (BP Location: Right Arm, Patient Position: Sitting, Cuff Size: Large)   Pulse (!) 103   Ht 5\' 5"  (1.651 m)   Wt 274 lb 9.6 oz (124.6 kg)   SpO2 95%   BMI 45.70 kg/m  Wt Readings from  Last 3 Encounters:  01/01/23 274 lb 9.6 oz (124.6 kg)  11/13/22 283 lb (128.4 kg)  09/13/22 279 lb (126.6 kg)    Lab Results  Component Value Date   TSH 2.510 11/13/2022   Lab Results  Component Value Date   WBC 11.7 (H) 11/13/2022   HGB 12.0 11/13/2022   HCT 35.5 11/13/2022   MCV 88 11/13/2022   PLT 317 11/13/2022   Lab Results  Component Value Date   NA 139 11/13/2022   K 4.0 11/13/2022   CO2 20 11/13/2022   GLUCOSE 88 11/13/2022   BUN 19 11/13/2022   CREATININE 1.10 (H) 11/13/2022   BILITOT <0.2 11/13/2022   ALKPHOS 99 11/13/2022   AST 18 11/13/2022   ALT 21 11/13/2022   PROT 7.3 11/13/2022   ALBUMIN 4.0 11/13/2022   CALCIUM 9.3 11/13/2022   ANIONGAP 7 04/25/2021   EGFR 60 11/13/2022   Lab Results  Component Value Date   CHOL 155 11/13/2022   Lab Results  Component Value Date   HDL 29 (L) 11/13/2022   Lab Results  Component Value Date   LDLCALC 103 (H) 11/13/2022   Lab Results  Component Value Date   TRIG 125 11/13/2022   Lab Results  Component Value Date   CHOLHDL 5.3 (H) 11/13/2022   Lab Results  Component Value Date   HGBA1C 6.5 (H) 11/13/2022      Assessment & Plan:   Problem List Items Addressed This Visit       Endocrine   Diabetic neuropathy (HCC)    On gabapentin 300 mg 3 times daily Still has chronic numbness and tingling of the bilateral LE Currently has symptoms of meralgia paresthetica, increased dose of gabapentin to 400 mg 3 times daily      Relevant Medications   gabapentin (NEURONTIN) 400 MG capsule   Diabetes mellitus (HCC)    Lab Results  Component Value Date   HGBA1C 6.5 (H) 11/13/2022   New onset On metformin 500 mg QD Advised to follow diabetic diet On ACEi and statin F/u CMP and lipid panel Diabetic foot exam: Today Diabetic eye exam: Advised to follow up with Ophthalmology for diabetic eye exam         Nervous and Auditory   Meralgia paresthetica of left side - Primary    Her left thigh area  burning pain is suggestive of meralgia paresthetica Unable to take NSAIDs due to GERD Increased dose of gabapentin to 400 mg 3 times daily Prednisone 20 mg QD X 5 days Referred to orthopedic surgeon      Relevant Medications   gabapentin (NEURONTIN) 400 MG capsule   predniSONE (DELTASONE) 20 MG tablet   Other Relevant Orders   Ambulatory referral to Orthopedic Surgery     Other   Allergic conjunctivitis of both eyes    Pataday eyedrops as needed for itching      Relevant Medications   olopatadine (PATADAY)  0.1 % ophthalmic solution    Meds ordered this encounter  Medications   olopatadine (PATADAY) 0.1 % ophthalmic solution    Sig: Place 1 drop into both eyes 2 (two) times daily.    Dispense:  5 mL    Refill:  1   gabapentin (NEURONTIN) 400 MG capsule    Sig: Take 1 capsule (400 mg total) by mouth 3 (three) times daily.    Dispense:  270 capsule    Refill:  1    Dose change   predniSONE (DELTASONE) 20 MG tablet    Sig: Take 1 tablet (20 mg total) by mouth daily with breakfast.    Dispense:  5 tablet    Refill:  0    Follow-up: Return if symptoms worsen or fail to improve.    Anabel Halon, MD

## 2023-01-02 NOTE — Assessment & Plan Note (Signed)
Pataday eyedrops as needed for itching

## 2023-01-12 ENCOUNTER — Other Ambulatory Visit: Payer: Self-pay | Admitting: Internal Medicine

## 2023-01-12 DIAGNOSIS — J410 Simple chronic bronchitis: Secondary | ICD-10-CM

## 2023-01-12 DIAGNOSIS — F419 Anxiety disorder, unspecified: Secondary | ICD-10-CM

## 2023-01-26 ENCOUNTER — Inpatient Hospital Stay (HOSPITAL_COMMUNITY)
Admission: EM | Admit: 2023-01-26 | Discharge: 2023-01-31 | DRG: 052 | Disposition: A | Discharge status: Home / Self Care | Payer: Medicaid Other | Attending: Internal Medicine | Admitting: Internal Medicine

## 2023-01-26 ENCOUNTER — Other Ambulatory Visit: Payer: Self-pay

## 2023-01-26 DIAGNOSIS — S0083XA Contusion of other part of head, initial encounter: Secondary | ICD-10-CM | POA: Diagnosis present

## 2023-01-26 DIAGNOSIS — E1165 Type 2 diabetes mellitus with hyperglycemia: Secondary | ICD-10-CM | POA: Diagnosis present

## 2023-01-26 DIAGNOSIS — R000 Tachycardia, unspecified: Secondary | ICD-10-CM | POA: Diagnosis present

## 2023-01-26 DIAGNOSIS — F419 Anxiety disorder, unspecified: Secondary | ICD-10-CM | POA: Diagnosis present

## 2023-01-26 DIAGNOSIS — E669 Obesity, unspecified: Secondary | ICD-10-CM | POA: Diagnosis present

## 2023-01-26 DIAGNOSIS — Z8249 Family history of ischemic heart disease and other diseases of the circulatory system: Secondary | ICD-10-CM

## 2023-01-26 DIAGNOSIS — M47812 Spondylosis without myelopathy or radiculopathy, cervical region: Secondary | ICD-10-CM | POA: Diagnosis present

## 2023-01-26 DIAGNOSIS — Z7984 Long term (current) use of oral hypoglycemic drugs: Secondary | ICD-10-CM

## 2023-01-26 DIAGNOSIS — Z9851 Tubal ligation status: Secondary | ICD-10-CM

## 2023-01-26 DIAGNOSIS — R7303 Prediabetes: Secondary | ICD-10-CM

## 2023-01-26 DIAGNOSIS — E785 Hyperlipidemia, unspecified: Secondary | ICD-10-CM | POA: Diagnosis present

## 2023-01-26 DIAGNOSIS — D72825 Bandemia: Secondary | ICD-10-CM | POA: Diagnosis present

## 2023-01-26 DIAGNOSIS — M5021 Other cervical disc displacement,  high cervical region: Secondary | ICD-10-CM | POA: Diagnosis present

## 2023-01-26 DIAGNOSIS — M25512 Pain in left shoulder: Secondary | ICD-10-CM | POA: Diagnosis present

## 2023-01-26 DIAGNOSIS — M25511 Pain in right shoulder: Secondary | ICD-10-CM | POA: Diagnosis present

## 2023-01-26 DIAGNOSIS — Z8711 Personal history of peptic ulcer disease: Secondary | ICD-10-CM

## 2023-01-26 DIAGNOSIS — Z9049 Acquired absence of other specified parts of digestive tract: Secondary | ICD-10-CM

## 2023-01-26 DIAGNOSIS — R059 Cough, unspecified: Secondary | ICD-10-CM | POA: Diagnosis present

## 2023-01-26 DIAGNOSIS — M50221 Other cervical disc displacement at C4-C5 level: Secondary | ICD-10-CM | POA: Diagnosis present

## 2023-01-26 DIAGNOSIS — I10 Essential (primary) hypertension: Secondary | ICD-10-CM | POA: Diagnosis present

## 2023-01-26 DIAGNOSIS — K219 Gastro-esophageal reflux disease without esophagitis: Secondary | ICD-10-CM | POA: Diagnosis present

## 2023-01-26 DIAGNOSIS — F17210 Nicotine dependence, cigarettes, uncomplicated: Secondary | ICD-10-CM | POA: Diagnosis present

## 2023-01-26 DIAGNOSIS — W109XXA Fall (on) (from) unspecified stairs and steps, initial encounter: Secondary | ICD-10-CM | POA: Diagnosis present

## 2023-01-26 DIAGNOSIS — R203 Hyperesthesia: Secondary | ICD-10-CM | POA: Diagnosis present

## 2023-01-26 DIAGNOSIS — F411 Generalized anxiety disorder: Secondary | ICD-10-CM | POA: Diagnosis present

## 2023-01-26 DIAGNOSIS — Z79899 Other long term (current) drug therapy: Secondary | ICD-10-CM

## 2023-01-26 DIAGNOSIS — R55 Syncope and collapse: Secondary | ICD-10-CM | POA: Diagnosis present

## 2023-01-26 DIAGNOSIS — T380X5A Adverse effect of glucocorticoids and synthetic analogues, initial encounter: Secondary | ICD-10-CM | POA: Diagnosis present

## 2023-01-26 DIAGNOSIS — Z975 Presence of (intrauterine) contraceptive device: Secondary | ICD-10-CM

## 2023-01-26 DIAGNOSIS — Z886 Allergy status to analgesic agent status: Secondary | ICD-10-CM

## 2023-01-26 DIAGNOSIS — Z720 Tobacco use: Secondary | ICD-10-CM | POA: Diagnosis present

## 2023-01-26 DIAGNOSIS — S14129A Central cord syndrome at unspecified level of cervical spinal cord, initial encounter: Principal | ICD-10-CM

## 2023-01-26 DIAGNOSIS — E1169 Type 2 diabetes mellitus with other specified complication: Secondary | ICD-10-CM | POA: Insufficient documentation

## 2023-01-26 DIAGNOSIS — Z833 Family history of diabetes mellitus: Secondary | ICD-10-CM

## 2023-01-26 DIAGNOSIS — G8929 Other chronic pain: Secondary | ICD-10-CM | POA: Diagnosis present

## 2023-01-26 DIAGNOSIS — Z6841 Body Mass Index (BMI) 40.0 and over, adult: Secondary | ICD-10-CM

## 2023-01-26 DIAGNOSIS — S14123A Central cord syndrome at C3 level of cervical spinal cord, initial encounter: Principal | ICD-10-CM | POA: Diagnosis present

## 2023-01-26 DIAGNOSIS — Z9012 Acquired absence of left breast and nipple: Secondary | ICD-10-CM

## 2023-01-26 DIAGNOSIS — R609 Edema, unspecified: Secondary | ICD-10-CM | POA: Diagnosis present

## 2023-01-26 DIAGNOSIS — Z880 Allergy status to penicillin: Secondary | ICD-10-CM

## 2023-01-26 DIAGNOSIS — Y92008 Other place in unspecified non-institutional (private) residence as the place of occurrence of the external cause: Secondary | ICD-10-CM

## 2023-01-26 HISTORY — DX: Type 2 diabetes mellitus without complications: E11.9

## 2023-01-27 ENCOUNTER — Emergency Department (HOSPITAL_COMMUNITY): Payer: Medicaid Other

## 2023-01-27 ENCOUNTER — Encounter (HOSPITAL_COMMUNITY): Payer: Self-pay

## 2023-01-27 DIAGNOSIS — R55 Syncope and collapse: Secondary | ICD-10-CM | POA: Diagnosis present

## 2023-01-27 DIAGNOSIS — S14123A Central cord syndrome at C3 level of cervical spinal cord, initial encounter: Secondary | ICD-10-CM | POA: Diagnosis not present

## 2023-01-27 DIAGNOSIS — M47812 Spondylosis without myelopathy or radiculopathy, cervical region: Secondary | ICD-10-CM | POA: Diagnosis not present

## 2023-01-27 DIAGNOSIS — S14129A Central cord syndrome at unspecified level of cervical spinal cord, initial encounter: Secondary | ICD-10-CM

## 2023-01-27 DIAGNOSIS — E1165 Type 2 diabetes mellitus with hyperglycemia: Secondary | ICD-10-CM | POA: Diagnosis not present

## 2023-01-27 DIAGNOSIS — F411 Generalized anxiety disorder: Secondary | ICD-10-CM

## 2023-01-27 DIAGNOSIS — Z8249 Family history of ischemic heart disease and other diseases of the circulatory system: Secondary | ICD-10-CM | POA: Diagnosis not present

## 2023-01-27 DIAGNOSIS — D72825 Bandemia: Secondary | ICD-10-CM | POA: Diagnosis not present

## 2023-01-27 DIAGNOSIS — K219 Gastro-esophageal reflux disease without esophagitis: Secondary | ICD-10-CM | POA: Diagnosis not present

## 2023-01-27 DIAGNOSIS — Z6841 Body Mass Index (BMI) 40.0 and over, adult: Secondary | ICD-10-CM | POA: Diagnosis not present

## 2023-01-27 DIAGNOSIS — R059 Cough, unspecified: Secondary | ICD-10-CM | POA: Diagnosis not present

## 2023-01-27 DIAGNOSIS — R609 Edema, unspecified: Secondary | ICD-10-CM | POA: Diagnosis not present

## 2023-01-27 DIAGNOSIS — I10 Essential (primary) hypertension: Secondary | ICD-10-CM

## 2023-01-27 DIAGNOSIS — F17210 Nicotine dependence, cigarettes, uncomplicated: Secondary | ICD-10-CM | POA: Diagnosis not present

## 2023-01-27 DIAGNOSIS — E1169 Type 2 diabetes mellitus with other specified complication: Secondary | ICD-10-CM | POA: Diagnosis not present

## 2023-01-27 DIAGNOSIS — W109XXA Fall (on) (from) unspecified stairs and steps, initial encounter: Secondary | ICD-10-CM | POA: Diagnosis present

## 2023-01-27 DIAGNOSIS — E785 Hyperlipidemia, unspecified: Secondary | ICD-10-CM | POA: Diagnosis not present

## 2023-01-27 DIAGNOSIS — E669 Obesity, unspecified: Secondary | ICD-10-CM

## 2023-01-27 DIAGNOSIS — M5021 Other cervical disc displacement,  high cervical region: Secondary | ICD-10-CM | POA: Diagnosis not present

## 2023-01-27 DIAGNOSIS — Z720 Tobacco use: Secondary | ICD-10-CM

## 2023-01-27 DIAGNOSIS — Y92008 Other place in unspecified non-institutional (private) residence as the place of occurrence of the external cause: Secondary | ICD-10-CM | POA: Diagnosis not present

## 2023-01-27 DIAGNOSIS — M50221 Other cervical disc displacement at C4-C5 level: Secondary | ICD-10-CM | POA: Diagnosis not present

## 2023-01-27 LAB — COMPREHENSIVE METABOLIC PANEL
ALT: 20 U/L (ref 0–44)
AST: 19 U/L (ref 15–41)
Albumin: 3.6 g/dL (ref 3.5–5.0)
Alkaline Phosphatase: 80 U/L (ref 38–126)
Anion gap: 11 (ref 5–15)
BUN: 28 mg/dL — ABNORMAL HIGH (ref 6–20)
CO2: 22 mmol/L (ref 22–32)
Calcium: 8.8 mg/dL — ABNORMAL LOW (ref 8.9–10.3)
Chloride: 102 mmol/L (ref 98–111)
Creatinine, Ser: 1.04 mg/dL — ABNORMAL HIGH (ref 0.44–1.00)
GFR, Estimated: >60 mL/min (ref >60)
Glucose, Bld: 153 mg/dL — ABNORMAL HIGH (ref 70–99)
Potassium: 3.6 mmol/L (ref 3.5–5.1)
Sodium: 135 mmol/L (ref 135–145)
Total Bilirubin: 0.5 mg/dL (ref 0.3–1.2)
Total Protein: 7.6 g/dL (ref 6.5–8.1)

## 2023-01-27 LAB — CBC WITH DIFFERENTIAL/PLATELET
Abs Immature Granulocytes: 0.07 10*3/uL (ref 0.00–0.07)
Basophils Absolute: 0.1 10*3/uL (ref 0.0–0.1)
Basophils Relative: 1 %
Eosinophils Absolute: 0.3 10*3/uL (ref 0.0–0.5)
Eosinophils Relative: 2 %
HCT: 36.2 % (ref 36.0–46.0)
Hemoglobin: 11.6 g/dL — ABNORMAL LOW (ref 12.0–15.0)
Immature Granulocytes: 1 %
Lymphocytes Relative: 24 %
Lymphs Abs: 3 10*3/uL (ref 0.7–4.0)
MCH: 30.1 pg (ref 26.0–34.0)
MCHC: 32 g/dL (ref 30.0–36.0)
MCV: 94 fL (ref 80.0–100.0)
Monocytes Absolute: 1 10*3/uL (ref 0.1–1.0)
Monocytes Relative: 9 %
Neutro Abs: 7.9 10*3/uL — ABNORMAL HIGH (ref 1.7–7.7)
Neutrophils Relative %: 63 %
Platelets: 239 10*3/uL (ref 150–400)
RBC: 3.85 MIL/uL — ABNORMAL LOW (ref 3.87–5.11)
RDW: 14.1 % (ref 11.5–15.5)
WBC: 12.3 10*3/uL — ABNORMAL HIGH (ref 4.0–10.5)
nRBC: 0 % (ref 0.0–0.2)

## 2023-01-27 LAB — CBC
HCT: 38.6 % (ref 36.0–46.0)
Hemoglobin: 12.6 g/dL (ref 12.0–15.0)
MCH: 29.6 pg (ref 26.0–34.0)
MCHC: 32.6 g/dL (ref 30.0–36.0)
MCV: 90.6 fL (ref 80.0–100.0)
Platelets: 319 10*3/uL (ref 150–400)
RBC: 4.26 MIL/uL (ref 3.87–5.11)
RDW: 13.7 % (ref 11.5–15.5)
WBC: 18.3 10*3/uL — ABNORMAL HIGH (ref 4.0–10.5)
nRBC: 0 % (ref 0.0–0.2)

## 2023-01-27 LAB — CREATININE, SERUM
Creatinine, Ser: 1.07 mg/dL — ABNORMAL HIGH (ref 0.44–1.00)
GFR, Estimated: >60 mL/min (ref >60)

## 2023-01-27 LAB — CBG MONITORING, ED: Glucose-Capillary: 143 mg/dL — ABNORMAL HIGH (ref 70–99)

## 2023-01-27 MED ORDER — DEXAMETHASONE SODIUM PHOSPHATE 10 MG/ML IJ SOLN
10.0000 mg | INTRAMUSCULAR | Status: DC
  Administered 2023-01-27 (×2): 10 mg via INTRAVENOUS
  Filled 2023-01-27 (×2): qty 1

## 2023-01-27 MED ORDER — HYDROCHLOROTHIAZIDE 25 MG PO TABS
25.0000 mg | ORAL_TABLET | Freq: Every day | ORAL | Status: DC
  Administered 2023-01-27 – 2023-01-31 (×5): 25 mg via ORAL
  Filled 2023-01-27 (×5): qty 1

## 2023-01-27 MED ORDER — OXYCODONE HCL 5 MG PO TABS
5.0000 mg | ORAL_TABLET | ORAL | Status: DC | PRN
  Administered 2023-01-27 – 2023-01-31 (×15): 5 mg via ORAL
  Filled 2023-01-27 (×15): qty 1

## 2023-01-27 MED ORDER — ONDANSETRON HCL 4 MG/2ML IJ SOLN: 4.0000 mg | Freq: Four times a day (QID) | INTRAMUSCULAR | Status: DC | PRN

## 2023-01-27 MED ORDER — ALPRAZOLAM 0.5 MG PO TABS
0.5000 mg | ORAL_TABLET | Freq: Every evening | ORAL | Status: DC | PRN
  Administered 2023-01-27 – 2023-01-30 (×4): 0.5 mg via ORAL
  Filled 2023-01-27 (×4): qty 1

## 2023-01-27 MED ORDER — GABAPENTIN 400 MG PO CAPS
400.0000 mg | ORAL_CAPSULE | Freq: Three times a day (TID) | ORAL | Status: DC
  Administered 2023-01-27 (×3): 400 mg via ORAL
  Filled 2023-01-27 (×3): qty 1

## 2023-01-27 MED ORDER — PANTOPRAZOLE SODIUM 40 MG PO TBEC
40.0000 mg | DELAYED_RELEASE_TABLET | Freq: Two times a day (BID) | ORAL | Status: DC
  Administered 2023-01-27 – 2023-01-31 (×8): 40 mg via ORAL
  Filled 2023-01-27 (×8): qty 1

## 2023-01-27 MED ORDER — ROSUVASTATIN CALCIUM 5 MG PO TABS
5.0000 mg | ORAL_TABLET | Freq: Every day | ORAL | Status: DC
  Administered 2023-01-27 – 2023-01-31 (×5): 5 mg via ORAL
  Filled 2023-01-27 (×5): qty 1

## 2023-01-27 MED ORDER — BUSPIRONE HCL 10 MG PO TABS
15.0000 mg | ORAL_TABLET | Freq: Every day | ORAL | Status: DC
  Administered 2023-01-27 – 2023-01-31 (×5): 15 mg via ORAL
  Filled 2023-01-27 (×5): qty 2

## 2023-01-27 MED ORDER — ACETAMINOPHEN 650 MG RE SUPP: 650.0000 mg | Freq: Four times a day (QID) | RECTAL | Status: DC | PRN

## 2023-01-27 MED ORDER — KETAMINE HCL 50 MG/5ML IJ SOSY
10.0000 mg | PREFILLED_SYRINGE | Freq: Once | INTRAMUSCULAR | Status: AC
  Administered 2023-01-27: 10 mg via INTRAVENOUS
  Filled 2023-01-27: qty 5

## 2023-01-27 MED ORDER — DEXAMETHASONE SODIUM PHOSPHATE 4 MG/ML IJ SOLN
4.0000 mg | Freq: Four times a day (QID) | INTRAMUSCULAR | Status: DC
  Administered 2023-01-27 – 2023-01-31 (×17): 4 mg via INTRAVENOUS
  Filled 2023-01-27 (×17): qty 1

## 2023-01-27 MED ORDER — OLOPATADINE HCL 0.1 % OP SOLN
1.0000 [drp] | Freq: Two times a day (BID) | OPHTHALMIC | Status: DC
  Administered 2023-01-27 – 2023-01-31 (×8): 1 [drp] via OPHTHALMIC
  Filled 2023-01-27: qty 5

## 2023-01-27 MED ORDER — HYDROMORPHONE HCL 1 MG/ML IJ SOLN
1.0000 mg | Freq: Once | INTRAMUSCULAR | Status: AC
  Administered 2023-01-27: 1 mg via INTRAVENOUS
  Filled 2023-01-27: qty 1

## 2023-01-27 MED ORDER — ONDANSETRON HCL 4 MG PO TABS: 4.0000 mg | ORAL_TABLET | Freq: Four times a day (QID) | ORAL | Status: DC | PRN

## 2023-01-27 MED ORDER — GABAPENTIN 400 MG PO CAPS
400.0000 mg | ORAL_CAPSULE | Freq: Once | ORAL | Status: AC
  Administered 2023-01-27: 400 mg via ORAL
  Filled 2023-01-27: qty 1

## 2023-01-27 MED ORDER — ACETAMINOPHEN 325 MG PO TABS
650.0000 mg | ORAL_TABLET | Freq: Four times a day (QID) | ORAL | Status: DC | PRN
  Administered 2023-01-29: 650 mg via ORAL
  Filled 2023-01-27: qty 2

## 2023-01-27 MED ORDER — FENTANYL CITRATE (PF) 100 MCG/2ML IJ SOLN
100.0000 ug | Freq: Once | INTRAMUSCULAR | Status: AC
  Administered 2023-01-27: 100 ug via INTRAVENOUS
  Filled 2023-01-27: qty 2

## 2023-01-27 MED ORDER — LORAZEPAM 2 MG/ML IJ SOLN: 0.5000 mg | Freq: Once | INTRAMUSCULAR | Status: DC | PRN

## 2023-01-27 MED ORDER — LORAZEPAM 2 MG/ML IJ SOLN
1.0000 mg | Freq: Once | INTRAMUSCULAR | Status: AC | PRN
  Administered 2023-01-27: 1 mg via INTRAVENOUS
  Filled 2023-01-27: qty 1

## 2023-01-27 MED ORDER — ENOXAPARIN SODIUM 60 MG/0.6ML IJ SOSY
60.0000 mg | PREFILLED_SYRINGE | INTRAMUSCULAR | Status: DC
  Administered 2023-01-27 – 2023-01-30 (×4): 60 mg via SUBCUTANEOUS
  Filled 2023-01-27 (×4): qty 0.6

## 2023-01-27 MED ORDER — HYDROMORPHONE HCL 1 MG/ML IJ SOLN
0.5000 mg | INTRAMUSCULAR | Status: DC | PRN
  Administered 2023-01-28 – 2023-01-29 (×5): 0.5 mg via INTRAVENOUS
  Filled 2023-01-27 (×5): qty 0.5

## 2023-01-27 MED ORDER — HYDROMORPHONE HCL 1 MG/ML IJ SOLN
0.5000 mg | INTRAMUSCULAR | Status: DC | PRN
  Administered 2023-01-27 (×3): 0.5 mg via INTRAVENOUS
  Filled 2023-01-27: qty 1
  Filled 2023-01-27: qty 0.5
  Filled 2023-01-27 (×2): qty 1

## 2023-01-27 NOTE — Assessment & Plan Note (Signed)
Continue blood pressure monitoring.  Resume lisinopril and hydrochlorothiazide

## 2023-01-27 NOTE — Assessment & Plan Note (Signed)
Calculated BMI is 45 consistent with class 3

## 2023-01-27 NOTE — ED Provider Notes (Signed)
4:52 AM patient arrived in transfer from [facility], patient was smoking cigarettes and she coughed really hard and she lost consciousness.  Now having neck pain and burning to bilateral arms.  Concern for central cord syndrome was sent here for MRI.  Patient having ongoing discomfort we will give her pain medicine here.  Awaiting MRI.  MRI with cord edema.  Signed out to Dr. Dalene Seltzer, please see their note for further details of care in the ED.     Melene Plan, DO 01/27/23 [phone]

## 2023-01-27 NOTE — Progress Notes (Signed)
Orthopedic Tech Progress Note Patient Details:  Brenda Taylor 07-06-1971 [id number]  Aspen collar delivered to pt room in the ED.  Ortho Devices Type of Ortho Device: Aspen cervical collar Ortho Device/Splint Location: at bedside for RN to apply Ortho Device/Splint Interventions: Ordered      Docia Furl 01/27/2023, 7:27 PM

## 2023-01-27 NOTE — Assessment & Plan Note (Signed)
Patient will need further pain control and physical therapy/occupational therapy assessment.  Continue with rigid collar and steroids per neurosurgery recommendations.  Currently on dexamethasone 4 mg q 6 hrs, when pain better control can transition to po formulation.

## 2023-01-27 NOTE — ED Triage Notes (Signed)
Pt arrived from home via [facility] c/o multiple sites of pain following a fall at home. Pt reports she was sitting on the top of her brick steps at home when she fell forward apprx 3 steps. Pt reports she had an episode of LOC, fell, and presents with small laceration to left 2nd toe, hematoma to right forehead, skin abrasion to right arm, bilateral shoulder pain.

## 2023-01-27 NOTE — Assessment & Plan Note (Signed)
Smoking cessation counseling 

## 2023-01-27 NOTE — ED Notes (Signed)
Report given to Aundra Millet RN, Consulting civil engineer at [employer] [facility] here for transport

## 2023-01-27 NOTE — Assessment & Plan Note (Signed)
Continue insulin sliding scale for glucose cover and monitoring Continue with statin therapy.  

## 2023-01-27 NOTE — ED Notes (Signed)
Pt arrived from [facility] for further treatment

## 2023-01-27 NOTE — Assessment & Plan Note (Signed)
Multifactorial syncope. Likely related to severe cough spell and vaso vagal episode.  No further work up needed.

## 2023-01-27 NOTE — Assessment & Plan Note (Signed)
Continue with buspirone and alprazolam.

## 2023-01-27 NOTE — ED Provider Notes (Addendum)
[facility]ERGENCY DEPARTMENT AT [facility] Provider Note   CSN: [id number] Arrival date & time: 01/26/23  2356     History  Chief Complaint  Patient presents with   Brenda Taylor is a 52 y.o. female.  52 year old female who presents the ER today after a fall.  Soundly the patient was sit on the steps.  It is unclear whether she syncopized and fell or fell then syncopized.  She is complaining of pretty severe burning and sharp pain in her upper extremities with weakness there as well.  She also has neck pain.  She has a contusion to her forehead.  She has a small cut and pain to her toes.  She has significant pain in her shoulders.  Lower extremities are unaffected.   Fall       Home Medications Prior to Admission medications   Medication Sig Start Date End Date Taking? Authorizing Provider  ALPRAZolam (XANAX) 0.5 MG tablet TAKE ONE TABLET BY MOUTH AT BEDTIME AS NEEDED FOR ANXIETY. 01/14/23  Yes Patel, Earlie Lou, MD  benzonatate (TESSALON) 200 MG capsule TAKE 1 CAPSULE BY MOUTH 2 TIMES DAILY AS NEEDED FOR COUGH. 01/14/23  Yes Patel, Earlie Lou, MD  busPIRone (BUSPAR) 15 MG tablet Take 1 tablet (15 mg total) by mouth daily. 11/13/22  Yes Anabel Halon, MD  gabapentin (NEURONTIN) 400 MG capsule Take 1 capsule (400 mg total) by mouth 3 (three) times daily. 01/01/23  Yes Anabel Halon, MD  hydrochlorothiazide (HYDRODIURIL) 25 MG tablet Take 1 tablet (25 mg total) by mouth daily. 11/13/22  Yes Anabel Halon, MD  lisinopril (ZESTRIL) 40 MG tablet Take 1 tablet (40 mg total) by mouth daily. 11/13/22  Yes Anabel Halon, MD  metFORMIN (GLUCOPHAGE-XR) 500 MG 24 hr tablet Take 1 tablet (500 mg total) by mouth daily with breakfast. 11/14/22  Yes Anabel Halon, MD  olopatadine (PATADAY) 0.1 % ophthalmic solution Place 1 drop into both eyes 2 (two) times daily. 01/01/23  Yes Anabel Halon, MD  pantoprazole (PROTONIX) 40 MG tablet Take 1 tablet (40 mg total) by mouth 2  (two) times daily before a meal. 06/06/22 06/06/23 Yes Carver, Charles K, DO  Plecanatide (TRULANCE) 3 MG TABS Take 3 mg by mouth daily. 08/01/21  Yes Tiffany Kocher, PA-C  predniSONE (DELTASONE) 20 MG tablet Take 1 tablet (20 mg total) by mouth daily with breakfast. 01/01/23  Yes Anabel Halon, MD  rosuvastatin (CRESTOR) 5 MG tablet Take 1 tablet (5 mg total) by mouth daily. 11/14/22  Yes Anabel Halon, MD      Allergies    Amoxicillin, Aspirin, and Nsaids    Review of Systems   Review of Systems  Physical Exam Updated Vital Signs BP 99/60   Pulse (!) 105   Resp 19   Ht 5\' 5"  (1.651 m)   Wt 123.8 kg   SpO2 93%   BMI 45.43 kg/m  Physical Exam Vitals and nursing note reviewed.  Constitutional:      Appearance: She is well-developed.  HENT:     Head: Normocephalic and atraumatic.  Eyes:     Pupils: Pupils are equal, round, and reactive to light.  Cardiovascular:     Rate and Rhythm: Normal rate and regular rhythm.  Pulmonary:     Effort: No respiratory distress.     Breath sounds: No stridor.  Abdominal:     General: Abdomen is flat. There is no distension.  Musculoskeletal:     Cervical back: Normal range of motion.  Skin:    Comments: Small skin tear medial side of her left big toe, hemostatic, no laceration.  Multiple large abrasions to her right arm consistent with some type of pressure injury.  Neurological:     Mental Status: She is alert.     Comments: Radiculopathic pain bilateral upper extremities with profound weakness in the same.  Lower extremity is unaffected.  C-collar in place.     ED Results / Procedures / Treatments   Labs (all labs ordered are listed, but only abnormal results are displayed) Labs Reviewed  CBC WITH DIFFERENTIAL/PLATELET - Abnormal; Notable for the following components:      Result Value   WBC 12.3 (*)    RBC 3.85 (*)    Hemoglobin 11.6 (*)    Neutro Abs 7.9 (*)    All other components within normal limits  COMPREHENSIVE  METABOLIC PANEL - Abnormal; Notable for the following components:   Glucose, Bld 153 (*)    BUN 28 (*)    Creatinine, Ser 1.04 (*)    Calcium 8.8 (*)    All other components within normal limits  CBG MONITORING, ED - Abnormal; Notable for the following components:   Glucose-Capillary 143 (*)    All other components within normal limits    EKG None  Radiology CT Cervical Spine Wo Contrast  Result Date: 01/27/2023 CLINICAL DATA:  Fall EXAM: CT CERVICAL SPINE WITHOUT CONTRAST TECHNIQUE: Multidetector CT imaging of the cervical spine was performed without intravenous contrast. Multiplanar CT image reconstructions were also generated. RADIATION DOSE REDUCTION: This exam was performed according to the departmental dose-optimization program which includes automated exposure control, adjustment of the mA and/or kV according to patient size and/or use of iterative reconstruction technique. COMPARISON:  None Available. Findings: Alignment: No static subluxation. Facets are aligned. Occipital condyles and the lateral masses of C1 and C2 are normally approximated. Skull base and vertebrae: No acute fracture. C6-7 ACDF. Soft tissues and spinal canal: No prevertebral fluid or swelling. No visible canal hematoma. Disc levels: No advanced spinal canal or neural foraminal stenosis. Upper chest: No pneumothorax, pulmonary nodule or pleural effusion. Other: Normal visualized paraspinal cervical soft tissues. IMPRESSION: 1. No acute fracture or static subluxation of the cervical spine. 2. C6-7 ACDF. Electronically Signed   By: Deatra Robinson M.D.   On: 01/27/2023 01:59   CT Head Wo Contrast  Result Date: 01/27/2023 CLINICAL DATA:  Trauma.  Fall. EXAM: CT HEAD WITHOUT CONTRAST TECHNIQUE: Contiguous axial images were obtained from the base of the skull through the vertex without intravenous contrast. RADIATION DOSE REDUCTION: This exam was performed according to the departmental dose-optimization program which includes  automated exposure control, adjustment of the mA and/or kV according to patient size and/or use of iterative reconstruction technique. COMPARISON:  Head CT 08/01/2014 FINDINGS: Brain: No evidence of acute infarction, hemorrhage, hydrocephalus, extra-axial collection or mass lesion/mass effect. Vascular: No hyperdense vessel or unexpected calcification. Skull: Normal. Negative for fracture or focal lesion. Sinuses/Orbits: No acute finding. Other: There is scalp soft tissue swelling overlying the superior frontal region. There is soft tissue prominence of the adenoids. IMPRESSION: No acute intracranial abnormality. Electronically Signed   By: Darliss Cheney M.D.   On: 01/27/2023 01:57   DG Chest Portable 1 View  Result Date: 01/27/2023 CLINICAL DATA:  Evaluate for injury, fall. EXAM: PORTABLE CHEST 1 VIEW COMPARISON:  Chest x-ray 09/13/2022 FINDINGS: The heart size and mediastinal contours are  within normal limits. Both lungs are clear. No acute fractures are seen. Cervical spinal fusion plate is present. IMPRESSION: No active disease. Electronically Signed   By: Darliss Cheney M.D.   On: 01/27/2023 01:35   DG Shoulder Left  Result Date: 01/27/2023 CLINICAL DATA:  Fall EXAM: LEFT SHOULDER - 2+ VIEW COMPARISON:  None Available. FINDINGS: Degenerative changes in the left shoulder most pronounced in the left [facility] joint with joint space narrowing and spurring. No acute bony abnormality. Specifically, no fracture, subluxation, or dislocation. IMPRESSION: Degenerative changes.  No acute bony abnormality. Electronically Signed   By: Charlett Nose M.D.   On: 01/27/2023 01:30   DG Shoulder Right  Result Date: 01/27/2023 CLINICAL DATA:  Fall EXAM: RIGHT SHOULDER - 2+ VIEW COMPARISON:  Chest x-ray 09/13/2022 FINDINGS: There appears to have been prior resection of the distal clavicle, unchanged since prior chest x-ray. No acute fracture, subluxation or dislocation. Glenohumeral joint is intact. IMPRESSION: No acute bony  abnormality. Electronically Signed   By: Charlett Nose M.D.   On: 01/27/2023 01:30    Procedures .Critical Care  Performed by: Marily Memos, MD Authorized by: Marily Memos, MD   Critical care provider statement:    Critical care time (minutes):  30   Critical care was necessary to treat or prevent imminent or life-threatening deterioration of the following conditions:  CNS failure or compromise   Critical care was time spent personally by me on the following activities:  Development of treatment plan with patient or surrogate, discussions with consultants, evaluation of patient's response to treatment, examination of patient, ordering and review of laboratory studies, ordering and review of radiographic studies, ordering and performing treatments and interventions, pulse oximetry, re-evaluation of patient's condition and review of old charts     Medications Ordered in ED Medications  LORazepam (ATIVAN) injection 0.5 mg (has no administration in time range)  dexamethasone (DECADRON) injection 10 mg (10 mg Intravenous Given 01/27/23 0321)  HYDROmorphone (DILAUDID) injection 0.5 mg (0.5 mg Intravenous Given 01/27/23 0409)  fentaNYL (SUBLIMAZE) injection 100 mcg (100 mcg Intravenous Given 01/27/23 0059)  HYDROmorphone (DILAUDID) injection 1 mg (1 mg Intravenous Given 01/27/23 0228)  gabapentin (NEURONTIN) capsule 400 mg (400 mg Oral Given 01/27/23 0321)  LORazepam (ATIVAN) injection 1 mg (1 mg Intravenous Given 01/27/23 0321)    ED Course/ Medical Decision Making/ A&P                             Medical Decision Making Amount and/or Complexity of Data Reviewed Labs: ordered. Radiology: ordered.  Risk Prescription drug management.  Highest concern is for central cord syndrome after a hyperextension injury of her neck and her neurologic findings to include her neck pain.  Will start with CT and x-rays and treat her pain.  Will also check basic labs for syncopal workup. CT head without obvious  fracture/bleed on my interpretation.  Ct cervical without obvious fracture, c6-7 fusion on my interpretation.  Xr chest/shoulders negative for fracture on my interpretation.  Pain still not controlled. Difficulty with grip, can't lift arms against gravity. Will d/w NSG, but suspect she will need MRI and admit for pain control/PT PRN.  Pain not improving much with meds. D/w Dr. Amador Cunas at [facility], agrees with confirming with an MRI, suggests trying steroids to see if it helps in the meantime. D/w Dr. Blinda Leatherwood at [facility] who accepts to the ED for MRI, pain control and further workup/decision making.  Final Clinical Impression(s) / ED Diagnoses Final diagnoses:  Central cord syndrome, initial encounter [facility])    Rx / DC Orders ED Discharge Orders     None         Alida Greiner, Barbara Cower, MD 01/27/23 1610    Marily Memos, MD 01/27/23 [id number]

## 2023-01-27 NOTE — ED Notes (Signed)
ED TO INPATIENT HANDOFF REPORT  ED Nurse Name and Phone #: Britt Boozer [phone]  S Name/Age/Gender Brenda Taylor 52 y.o. female Room/Bed: 001C/001C  Code Status   Code Status: Full Code  Home/SNF/Other Home Patient oriented to: self, place, time, and situation Is this baseline? Yes   Triage Complete: Triage complete  Chief Complaint Syncope [R55]  Triage Note Pt arrived from home via [facility] c/o multiple sites of pain following a fall at home. Pt reports she was sitting on the top of her brick steps at home when she fell forward apprx 3 steps. Pt reports she had an episode of LOC, fell, and presents with small laceration to left 2nd toe, hematoma to right forehead, skin abrasion to right arm, bilateral shoulder pain.   Allergies Allergies  Allergen Reactions   Amoxicillin Hives   Aspirin Other (See Comments)    Cannot take due to ulcers   Nsaids Other (See Comments)    Cannot take due to ulcers     Level of Care/Admitting Diagnosis ED Disposition     ED Disposition  Admit   Condition  --   Comment  Hospital Area: [facility] [[id number]]  Level of Care: Telemetry Medical [104]  May place patient in observation at [facility] or [facility] if equivalent level of care is available:: No  Covid Evaluation: Asymptomatic - no recent exposure (last 10 days) testing not required  Diagnosis: Syncope [206001]  Admitting Physician: Coralie Keens [[id number]]  Attending Physician: Coralie Keens [[id number]]          B Medical/Surgery History Past Medical History:  Diagnosis Date   Anxiety    Chronic pain    DDD (degenerative disc disease)    Diabetes mellitus without complication (HCC)    GERD (gastroesophageal reflux disease)    HBP (high blood pressure)    Headache    Hernia    Multiple gastric ulcers    Pre-diabetes    Rotator cuff syndrome of left shoulder 10/09/2011   Ulcer disease    Past Surgical History:  Procedure Laterality Date    APPENDECTOMY     BACK SURGERY     x5   BIOPSY  05/04/2021   Procedure: BIOPSY;  Surgeon: Lanelle Bal, DO;  [facility];  Service: Endoscopy;;   BREAST SURGERY     CARPAL TUNNEL RELEASE     bilateral   CHOLECYSTECTOMY     COLONOSCOPY  10/20/2009   EXB:MWUXLKG anal canal/left-side diverticula and multiple polyps (hyperplastic). poor prep compromised exam. Next TCS 09/2014.   COLONOSCOPY WITH PROPOFOL N/A 05/04/2021   Procedure: COLONOSCOPY WITH PROPOFOL;  Surgeon: Lanelle Bal, DO;  Location: [facility];  Service: Endoscopy;  Laterality: N/A;  9:45am   ESOPHAGOGASTRODUODENOSCOPY  03/06/2010   MWN:UUVO quadrant distal esophageal/patent tubular esophagus/small HH, antral erosions and ulcerations. Bx negative for persistent H.pylori   ESOPHAGOGASTRODUODENOSCOPY  10/20/2009   ZDG:UYQIH HH/prepyloric antral ulcer s/p bx (+h.pylori), ERE   ESOPHAGOGASTRODUODENOSCOPY (EGD) WITH PROPOFOL N/A 05/04/2021   Procedure: ESOPHAGOGASTRODUODENOSCOPY (EGD) WITH PROPOFOL;  Surgeon: Lanelle Bal, DO;  Location: [facility];  Service: Endoscopy;  Laterality: N/A;   INTRAUTERINE DEVICE INSERTION     LEEP N/A 02/14/2021   Procedure: LOOP ELECTROSURGICAL EXCISION PROCEDURE (LEEP);  Surgeon: Myna Hidalgo, DO;  Location: [facility] ORS;  Service: Gynecology;  Laterality: N/A;   MASTECTOMY, PARTIAL Left    x 2   MOUTH SURGERY     POLYPECTOMY  05/04/2021  Procedure: POLYPECTOMY;  Surgeon: Lanelle Bal, DO;  Location: [facility];  Service: Endoscopy;;   ROTATOR CUFF REPAIR     right   TUBAL LIGATION       A IV Location/Drains/Wounds Patient Lines/Drains/Airways Status     Active Line/Drains/Airways     Name Placement date Placement time Site Days   Peripheral IV 01/27/23 Anterior;Left Forearm 01/27/23  0058  Forearm  less than 1   Airway 02/14/21  1010  -- 712   Airway 02/14/21  1017  -- 712            Intake/Output Last 24 hours No intake or output data in the  24 hours ending 01/27/23 1658  Labs/Imaging Results for orders placed or performed during the hospital encounter of 01/26/23 (from the past 48 hour(s))  CBG monitoring, ED     Status: Abnormal   Collection Time: 01/27/23 12:04 AM  Result Value Ref Range   Glucose-Capillary 143 (H) 70 - 99 mg/dL    Comment: Glucose reference range applies only to samples taken after fasting for at least 8 hours.  CBC with Differential     Status: Abnormal   Collection Time: 01/27/23 12:56 AM  Result Value Ref Range   WBC 12.3 (H) 4.0 - 10.5 K/uL   RBC 3.85 (L) 3.87 - 5.11 MIL/uL   Hemoglobin 11.6 (L) 12.0 - 15.0 g/dL   HCT 16.1 09.6 - 04.5 %   MCV 94.0 80.0 - 100.0 fL   MCH 30.1 26.0 - 34.0 pg   MCHC 32.0 30.0 - 36.0 g/dL   RDW 40.9 81.1 - 91.4 %   Platelets 239 150 - 400 K/uL   nRBC 0.0 0.0 - 0.2 %   Neutrophils Relative % 63 %   Neutro Abs 7.9 (H) 1.7 - 7.7 K/uL   Lymphocytes Relative 24 %   Lymphs Abs 3.0 0.7 - 4.0 K/uL   Monocytes Relative 9 %   Monocytes Absolute 1.0 0.1 - 1.0 K/uL   Eosinophils Relative 2 %   Eosinophils Absolute 0.3 0.0 - 0.5 K/uL   Basophils Relative 1 %   Basophils Absolute 0.1 0.0 - 0.1 K/uL   Immature Granulocytes 1 %   Abs Immature Granulocytes 0.07 0.00 - 0.07 K/uL    Comment: Performed at [facility], [address] [place], [place] [address]  Comprehensive metabolic panel     Status: Abnormal   Collection Time: 01/27/23 12:56 AM  Result Value Ref Range   Sodium 135 135 - 145 mmol/L   Potassium 3.6 3.5 - 5.1 mmol/L   Chloride 102 98 - 111 mmol/L   CO2 22 22 - 32 mmol/L   Glucose, Bld 153 (H) 70 - 99 mg/dL    Comment: Glucose reference range applies only to samples taken after fasting for at least 8 hours.   BUN 28 (H) 6 - 20 mg/dL   Creatinine, Ser 6.21 (H) 0.44 - 1.00 mg/dL   Calcium 8.8 (L) 8.9 - 10.3 mg/dL   Total Protein 7.6 6.5 - 8.1 g/dL   Albumin 3.6 3.5 - 5.0 g/dL   AST 19 15 - 41 U/L   ALT 20 0 - 44 U/L   Alkaline Phosphatase 80 38 - 126 U/L    Total Bilirubin 0.5 0.3 - 1.2 mg/dL   GFR, Estimated >30 >86 mL/min    Comment: (NOTE) Calculated using the CKD-EPI Creatinine Equation (2021)    Anion gap 11 5 - 15    Comment: Performed at  [facility], [address], [place], [place] [address]   MR CERVICAL SPINE WO CONTRAST  Result Date: 01/27/2023 CLINICAL DATA:  Acute cervical myelopathy.  Fall. EXAM: MRI CERVICAL SPINE WITHOUT CONTRAST TECHNIQUE: Multiplanar, multisequence MR imaging of the cervical spine was performed. No intravenous contrast was administered. COMPARISON:  Cervical spine CT from earlier the same day FINDINGS: Alignment: Physiologic. Vertebrae: No fracture, evidence of discitis, or bone lesion. Cord: Small T2 hyperintensity in the right cord at the level of C3-4. No cord hemorrhage or collection is seen. Posterior Fossa, vertebral arteries, paraspinal tissues: T2 hyperintensity within the deep intrinsic neck muscles posteriorly at the level of C3-C5 with adjacent fat edema. Prevertebral effusion at the same levels. No major ligamentous disruption noted. Disc levels: C2-3: Central protrusion contacting but not compressing the cord. C3-4: Central disc protrusion flattening the cord.  Negative facets. C4-5: Central herniation indenting the ventral cord. C5-6: Disc space narrowing and bulging with left paracentral protrusion contacting the left cord and exiting C6 nerve root. C6-7: ACDF with uncomplicated appearance. C7-T1:Mild disc bulging. IMPRESSION: 1. Soft tissue injury with prevertebral effusion and posterior strain at C2-C5. No occult fracture or major ligamentous disruption is seen. 2. Mild cord edema rightward at the level of C3-4 where there is a disc protrusion flattening the ventral cord. 3. Additional disc protrusions with cord contact at C2-3 and C4-5. 4. Uncomplicated C6-7 ACDF. Electronically Signed   By: Tiburcio Pea M.D.   On: 01/27/2023 06:49   CT Cervical Spine Wo Contrast  Result Date: 01/27/2023 CLINICAL  DATA:  Fall EXAM: CT CERVICAL SPINE WITHOUT CONTRAST TECHNIQUE: Multidetector CT imaging of the cervical spine was performed without intravenous contrast. Multiplanar CT image reconstructions were also generated. RADIATION DOSE REDUCTION: This exam was performed according to the departmental dose-optimization program which includes automated exposure control, adjustment of the mA and/or kV according to patient size and/or use of iterative reconstruction technique. COMPARISON:  None Available. Findings: Alignment: No static subluxation. Facets are aligned. Occipital condyles and the lateral masses of C1 and C2 are normally approximated. Skull base and vertebrae: No acute fracture. C6-7 ACDF. Soft tissues and spinal canal: No prevertebral fluid or swelling. No visible canal hematoma. Disc levels: No advanced spinal canal or neural foraminal stenosis. Upper chest: No pneumothorax, pulmonary nodule or pleural effusion. Other: Normal visualized paraspinal cervical soft tissues. IMPRESSION: 1. No acute fracture or static subluxation of the cervical spine. 2. C6-7 ACDF. Electronically Signed   By: Deatra Robinson M.D.   On: 01/27/2023 01:59   CT Head Wo Contrast  Result Date: 01/27/2023 CLINICAL DATA:  Trauma.  Fall. EXAM: CT HEAD WITHOUT CONTRAST TECHNIQUE: Contiguous axial images were obtained from the base of the skull through the vertex without intravenous contrast. RADIATION DOSE REDUCTION: This exam was performed according to the departmental dose-optimization program which includes automated exposure control, adjustment of the mA and/or kV according to patient size and/or use of iterative reconstruction technique. COMPARISON:  Head CT 08/01/2014 FINDINGS: Brain: No evidence of acute infarction, hemorrhage, hydrocephalus, extra-axial collection or mass lesion/mass effect. Vascular: No hyperdense vessel or unexpected calcification. Skull: Normal. Negative for fracture or focal lesion. Sinuses/Orbits: No acute  finding. Other: There is scalp soft tissue swelling overlying the superior frontal region. There is soft tissue prominence of the adenoids. IMPRESSION: No acute intracranial abnormality. Electronically Signed   By: Darliss Cheney M.D.   On: 01/27/2023 01:57   DG Chest Portable 1 View  Result Date: 01/27/2023 CLINICAL DATA:  Evaluate for  injury, fall. EXAM: PORTABLE CHEST 1 VIEW COMPARISON:  Chest x-ray 09/13/2022 FINDINGS: The heart size and mediastinal contours are within normal limits. Both lungs are clear. No acute fractures are seen. Cervical spinal fusion plate is present. IMPRESSION: No active disease. Electronically Signed   By: Darliss Cheney M.D.   On: 01/27/2023 01:35   DG Shoulder Left  Result Date: 01/27/2023 CLINICAL DATA:  Fall EXAM: LEFT SHOULDER - 2+ VIEW COMPARISON:  None Available. FINDINGS: Degenerative changes in the left shoulder most pronounced in the left [facility] joint with joint space narrowing and spurring. No acute bony abnormality. Specifically, no fracture, subluxation, or dislocation. IMPRESSION: Degenerative changes.  No acute bony abnormality. Electronically Signed   By: Charlett Nose M.D.   On: 01/27/2023 01:30   DG Shoulder Right  Result Date: 01/27/2023 CLINICAL DATA:  Fall EXAM: RIGHT SHOULDER - 2+ VIEW COMPARISON:  Chest x-ray 09/13/2022 FINDINGS: There appears to have been prior resection of the distal clavicle, unchanged since prior chest x-ray. No acute fracture, subluxation or dislocation. Glenohumeral joint is intact. IMPRESSION: No acute bony abnormality. Electronically Signed   By: Charlett Nose M.D.   On: 01/27/2023 01:30    Pending Labs Unresulted Labs (From admission, onward)     Start     Ordered   02/03/23 0500  Creatinine, serum  (enoxaparin (LOVENOX)    CrCl >/= 30 ml/min)  Weekly,   R     Comments: while on enoxaparin therapy    01/27/23 1514   01/28/23 0500  Basic metabolic panel  Tomorrow morning,   R        01/27/23 1514   01/28/23 0500  CBC  Tomorrow  morning,   R        01/27/23 1514   01/27/23 1515  CBC  (enoxaparin (LOVENOX)    CrCl >/= 30 ml/min)  Once,   R       Comments: Baseline for enoxaparin therapy IF NOT ALREADY DRAWN.  Notify MD if PLT < 100 K.    01/27/23 1514   01/27/23 1515  Creatinine, serum  (enoxaparin (LOVENOX)    CrCl >/= 30 ml/min)  Once,   R       Comments: Baseline for enoxaparin therapy IF NOT ALREADY DRAWN.    01/27/23 1514            Vitals/Pain Today's Vitals   01/27/23 1126 01/27/23 1313 01/27/23 1451 01/27/23 1656  BP: 120/71  117/73   Pulse: 99  93   Resp: 18  18   Temp: 99.1 F (37.3 C)  99.1 F (37.3 C)   TempSrc: Oral  Oral   SpO2: 92%  90%   Weight:      Height:      PainSc:  10-Worst pain ever  8     Isolation Precautions No active isolations  Medications Medications  dexamethasone (DECADRON) injection 4 mg (4 mg Intravenous Given 01/27/23 1304)  gabapentin (NEURONTIN) capsule 400 mg (400 mg Oral Given 01/27/23 1555)  hydrochlorothiazide (HYDRODIURIL) tablet 25 mg (25 mg Oral Given 01/27/23 1555)  rosuvastatin (CRESTOR) tablet 5 mg (5 mg Oral Given 01/27/23 1555)  ALPRAZolam (XANAX) tablet 0.5 mg (has no administration in time range)  busPIRone (BUSPAR) tablet 15 mg (15 mg Oral Given 01/27/23 1554)  pantoprazole (PROTONIX) EC tablet 40 mg (has no administration in time range)  olopatadine (PATANOL) 0.1 % ophthalmic solution 1 drop (has no administration in time range)  enoxaparin (LOVENOX) injection 60 mg (has no administration  in time range)  acetaminophen (TYLENOL) tablet 650 mg (has no administration in time range)    Or  acetaminophen (TYLENOL) suppository 650 mg (has no administration in time range)  ondansetron (ZOFRAN) tablet 4 mg (has no administration in time range)    Or  ondansetron (ZOFRAN) injection 4 mg (has no administration in time range)  HYDROmorphone (DILAUDID) injection 0.5 mg (has no administration in time range)  oxyCODONE (Oxy IR/ROXICODONE) immediate release  tablet 5 mg (5 mg Oral Given 01/27/23 1555)  fentaNYL (SUBLIMAZE) injection 100 mcg (100 mcg Intravenous Given 01/27/23 0059)  HYDROmorphone (DILAUDID) injection 1 mg (1 mg Intravenous Given 01/27/23 0228)  gabapentin (NEURONTIN) capsule 400 mg (400 mg Oral Given 01/27/23 0321)  LORazepam (ATIVAN) injection 1 mg (1 mg Intravenous Given 01/27/23 0321)  ketamine 50 mg in normal saline 5 mL (10 mg/mL) syringe (10 mg Intravenous Given 01/27/23 0537)    Mobility walks        R Recommendations: See Admitting Provider Note  Report given to:   Additional Notes: cspine injury from fall, has ccollar on, just moved her into inpatient bed, A&Ox4, waiting on aspen from ortho tech, initially no movement in BUE, now has limited movement and equal grip

## 2023-01-27 NOTE — H&P (Signed)
History and Physical    Patient: Brenda Taylor:[id number] DOB: [birth year] DOA: 01/26/2023 DOS: the patient was seen and examined on 01/27/2023 PCP: Anabel Halon, MD  Patient coming from: Home  Chief Complaint:  Chief Complaint  Patient presents with   Fall   HPI: Brenda Taylor is a 52 y.o. female with medical history significant of anxiety, T2DM, GERD, tobacco abuse, and obesity class 3 who presented after syncope episode.  Apparently patient has been at her usual stat of health until early this morning when she experienced a syncope episode. She was seating in her porch smoking a cigarette, when she had a severe cough spell, severe enough that she loss her consciousness and fell backwards. When she woke up she was on the floor and was not able to move her arms or legs. Her family called EMS and she was transported to the ED.  Patient was evaluated at [facility] ED, concern for spine injury and transferred to [facility] ED for further MRI evaluation.  MRI positive for cord edema, neurosurgery consulted and recommended to continue medical therapy and use cervical collar at all times.  No need for surgical intervention.   At the time of my examination she has is having severe pain at the level of her shoulders, 10/10 in intensity, dull and sharp in nature, worse with movement and no improving factors. She has paresthesias on her hands and feet, and continue to have weakness in all 4 extremities.   Review of Systems: As mentioned in the history of present illness. All other systems reviewed and are negative. Past Medical History:  Diagnosis Date   Anxiety    Chronic pain    DDD (degenerative disc disease)    Diabetes mellitus without complication (HCC)    GERD (gastroesophageal reflux disease)    HBP (high blood pressure)    Headache    Hernia    Multiple gastric ulcers    Pre-diabetes    Rotator cuff syndrome of left shoulder 10/09/2011   Ulcer disease    Past Surgical History:  Procedure  Laterality Date   APPENDECTOMY     BACK SURGERY     x5   BIOPSY  05/04/2021   Procedure: BIOPSY;  Surgeon: Lanelle Bal, DO;  [facility];  Service: Endoscopy;;   BREAST SURGERY     CARPAL TUNNEL RELEASE     bilateral   CHOLECYSTECTOMY     COLONOSCOPY  10/20/2009   WJX:BJYNWGN anal canal/left-side diverticula and multiple polyps (hyperplastic). poor prep compromised exam. Next TCS 09/2014.   COLONOSCOPY WITH PROPOFOL N/A 05/04/2021   Procedure: COLONOSCOPY WITH PROPOFOL;  Surgeon: Lanelle Bal, DO;  [facility];  Service: Endoscopy;  Laterality: N/A;  9:45am   ESOPHAGOGASTRODUODENOSCOPY  03/06/2010   FAO:ZHYQ quadrant distal esophageal/patent tubular esophagus/small HH, antral erosions and ulcerations. Bx negative for persistent H.pylori   ESOPHAGOGASTRODUODENOSCOPY  10/20/2009   MVH:QIONG HH/prepyloric antral ulcer s/p bx (+h.pylori), ERE   ESOPHAGOGASTRODUODENOSCOPY (EGD) WITH PROPOFOL N/A 05/04/2021   Procedure: ESOPHAGOGASTRODUODENOSCOPY (EGD) WITH PROPOFOL;  Surgeon: Lanelle Bal, DO;  [facility];  Service: Endoscopy;  Laterality: N/A;   INTRAUTERINE DEVICE INSERTION     LEEP N/A 02/14/2021   Procedure: LOOP ELECTROSURGICAL EXCISION PROCEDURE (LEEP);  Surgeon: Myna Hidalgo, DO;  Location: [facility];  Service: Gynecology;  Laterality: N/A;   MASTECTOMY, PARTIAL Left    x 2   MOUTH SURGERY     POLYPECTOMY  05/04/2021   Procedure:  POLYPECTOMY;  Surgeon: Lanelle Bal, DO;  Location[facility];  Service: Endoscopy;;   ROTATOR CUFF REPAIR     right   TUBAL LIGATION     Social History:  reports that she has been smoking cigarettes. She has been smoking an average of .5 packs per day. She has been exposed to tobacco smoke. She has never used smokeless tobacco. She reports current drug use. Frequency: 7.00 times per week. Drug: Marijuana. She reports that she does not drink alcohol.  Allergies  Allergen Reactions   Amoxicillin  Hives   Aspirin Other (See Comments)    Cannot take due to ulcers   Nsaids Other (See Comments)    Cannot take due to ulcers     Family History  Problem Relation Age of Onset   Heart disease Other    Arthritis Other    Cancer Other    Asthma Other    Diabetes Other    Heart disease Mother    Diabetes Mother    Cancer Mother    Non-Hodgkin's lymphoma Mother    Hypertension Mother    Arthritis Mother    Lung cancer Father    Neuropathy Father    Hypertension Father     Prior to Admission medications   Medication Sig Start Date End Date Taking? Authorizing Provider  ALPRAZolam (XANAX) 0.5 MG tablet TAKE ONE TABLET BY MOUTH AT BEDTIME AS NEEDED FOR ANXIETY. Patient taking differently: Take 0.5 mg by mouth at bedtime as needed for sleep or anxiety. 01/14/23  Yes Patel, Earlie Lou, MD  benzonatate (TESSALON) 200 MG capsule TAKE 1 CAPSULE BY MOUTH 2 TIMES DAILY AS NEEDED FOR COUGH. Patient taking differently: Take 200 mg by mouth daily. 01/14/23  Yes Anabel Halon, MD  busPIRone (BUSPAR) 15 MG tablet Take 1 tablet (15 mg total) by mouth daily. 11/13/22  Yes Anabel Halon, MD  cyclobenzaprine (FLEXERIL) 10 MG tablet Take 10 mg by mouth as needed for muscle spasms.   Yes [provider]  gabapentin (NEURONTIN) 400 MG capsule Take 1 capsule (400 mg total) by mouth 3 (three) times daily. Patient taking differently: Take 400-800 mg by mouth See admin instructions. Take 800 mg by mouth in the morning ans 400 mg by mouth at night 01/01/23  Yes Patel, Earlie Lou, MD  hydrochlorothiazide (HYDRODIURIL) 25 MG tablet Take 1 tablet (25 mg total) by mouth daily. 11/13/22  Yes Anabel Halon, MD  lisinopril (ZESTRIL) 40 MG tablet Take 1 tablet (40 mg total) by mouth daily. 11/13/22  Yes Anabel Halon, MD  metFORMIN (GLUCOPHAGE-XR) 500 MG 24 hr tablet Take 1 tablet (500 mg total) by mouth daily with breakfast. 11/14/22  Yes Anabel Halon, MD  naproxen sodium (ALEVE) 220 MG tablet Take 440 mg by  mouth daily as needed (headache).   Yes [provider]  pantoprazole (PROTONIX) 40 MG tablet Take 1 tablet (40 mg total) by mouth 2 (two) times daily before a meal. Patient taking differently: Take 40 mg by mouth daily. 06/06/22 06/06/23 Yes Carver, Charles K, DO  rosuvastatin (CRESTOR) 5 MG tablet Take 1 tablet (5 mg total) by mouth daily. 11/14/22  Yes Anabel Halon, MD  olopatadine (PATADAY) 0.1 % ophthalmic solution Place 1 drop into both eyes 2 (two) times daily. Patient not taking: Reported on 01/27/2023 01/01/23   Anabel Halon, MD  predniSONE (DELTASONE) 20 MG tablet Take 1 tablet (20 mg total) by mouth daily with breakfast. Patient not taking:  Reported on 01/27/2023 01/01/23   Anabel Halon, MD    Physical Exam: Vitals:   01/27/23 0738 01/27/23 0800 01/27/23 0900 01/27/23 1126  BP: 127/84 131/82 110/76 120/71  Pulse: 100 (!) 107 99 99  Resp: 18   18  Temp: 99.1 F (37.3 C)   99.1 F (37.3 C)  TempSrc: Oral   Oral  SpO2: 94% 95% 93% 92%  Weight:      Height:       Neurology awake and alert. Rigid cervical collar in place. Cranial nerves 2-12 intact.  Strength is reduced to 3/4 all four extremities. She is able to follow commands and respond to questions appropriately.  ENT with mild pallor Cardiovascular with S1 and S2 present and rhythmic with no gallops, rubs or murmurs Respiratory with no rales or wheezing, no rhonchi Abdomen with no distention or rebound No  lower extremity edema No rashes.  Data Reviewed:   Na 135, K 3,6 Cl 102 bicarbonate 22, glucose 153 bun 28. Cr 1.0 Wbc 12.3 hgb 11,6 plt 239   EKG 91 bpm, normal axis, normal intervals, sinus rhythm with no significant ST segment or T wave changes.   CT head and cervical spine with no acute changes.  Chest radiograph with no cardiomegaly, no effusions or infiltrates.  Shoulders radiographs with no acute bony abnormalities.   MRI cervical spine with soft tissue injury with prevertebral effusion and  posterior strain at C2 and C5. No occult fracture or major ligamentous disruption. Mild cord edema rightward at the level of C3-C4 where there is a disc protrusion flattening the ventral cord.    Assessment and Plan: * Syncope Multifactorial syncope. Likely related to severe cough spell and vaso vagal episode.  No further work up needed.   Central cord syndrome [facility]) Patient will need further pain control and physical therapy/occupational therapy assessment.  Continue with rigid collar and steroids per neurosurgery recommendations.  Currently on dexamethasone 4 mg q 6 hrs, when pain better control can transition to po formulation.  Hypertension Continue blood pressure monitoring.  Resume lisinopril and hydrochlorothiazide   Type 2 diabetes mellitus with hyperlipidemia (HCC) Continue insulin sliding scale for glucose cover and monitoring Continue with statin therapy.   GAD (generalized anxiety disorder) Continue with buspirone and alprazolam.   Obesity (BMI 30-39.9) Calculated BMI is 45 consistent with class 3  Tobacco abuse Smoking cessation counseling.       Advance Care Planning:   Code Status: Full Code   Consults: neurosurgery   Family Communication: no family at the bedside   Severity of Illness: The appropriate patient status for this patient is OBSERVATION. Observation status is judged to be reasonable and necessary in order to provide the required intensity of service to ensure the patient's safety. The patient's presenting symptoms, physical exam findings, and initial radiographic and laboratory data in the context of their medical condition is felt to place them at decreased risk for further clinical deterioration. Furthermore, it is anticipated that the patient will be medically stable for discharge from the hospital within 2 midnights of admission.   Author: Coralie Keens, MD 01/27/2023 12:54 PM  For on call review [email or web address].

## 2023-01-27 NOTE — Consult Note (Signed)
   Providing Compassionate, Quality Care - Together  Neurosurgery Consult  Referring physician: ED  Reason for referral: Fall, central cord syndrome  Chief Complaint: Syncopal fall  History of Present Illness: This is a 52 year old female, with a history of C6-7 ACDF many years ago, with a syncopal event while coughing last p.m.  She does not remember the event, was with her fianc and fell and hit her head approximately 3 to 4 feet.  Since then she has complained of bilateral upper extremity numbness tingling burning type pain and weakness.  She denies any numbness tingling or weakness in her lower extremities, does have chronic left lower extremity radiculopathy.  She is currently in a cervical collar, was given steroid last night and feels that she has made some slight improvement.  She denies any bowel or bladder changes, denies any groin numbness or saddle anesthesia.  Medications: I have reviewed the patient's current medications. Allergies: No Known Allergies  History reviewed. No pertinent family history. Social History:  has no history on file for tobacco use, alcohol use, and drug use.  ROS: All pertinent positives and negatives are listed in HPI above  Physical Exam:  Vital signs in last 24 hours: Temp:  [98 F (36.7 C)-98.3 F (36.8 C)] 98 F (36.7 C) (07/25 1814) Pulse Rate:  [58-128] 65 (07/26 0746) Resp:  [11-18] 14 (07/26 0217) BP: (138-182)/(65-125) 153/88 (07/26 0700) SpO2:  [91 %-98 %] 96 % (07/26 0746) PE: Awake alert oriented x 3, tearful PERRLA Cranial nerves II through XII intact Cervical collar in place Nonlabored breathing Bilateral lower extremities full strength throughout Bilateral upper extremities grip 4 -/5, bicep 4 -/5, tricep 4 -/5, deltoids 3/5 Dysesthetic pain bilateral upper extremities   Impression/Assessment:  52 year old female with  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  There is adjacent segment stenosis and small disc protrusion at C3-4 and C4-5, with small cord edema at C3-4.  I discussed this with the patient.  We will continue Decadron and Neurontin for pain control. -Okay to eat from my standpoint, no acute neurosurgical intervention as she seems to be making improvements -Okay to sit up, be out of bed with cervical collar at all times  Thank you for allowing me to participate in this patient's care.  Please do not hesitate to call with questions or concerns.   Monia Pouch, DO Neurosurgeon [facility] Cell: [phone]

## 2023-01-27 NOTE — ED Notes (Signed)
Pt states she can not breath; pt O2 sats at 97%;

## 2023-01-28 DIAGNOSIS — S0083XA Contusion of other part of head, initial encounter: Secondary | ICD-10-CM | POA: Diagnosis present

## 2023-01-28 DIAGNOSIS — E1169 Type 2 diabetes mellitus with other specified complication: Secondary | ICD-10-CM

## 2023-01-28 DIAGNOSIS — Y92008 Other place in unspecified non-institutional (private) residence as the place of occurrence of the external cause: Secondary | ICD-10-CM | POA: Diagnosis not present

## 2023-01-28 DIAGNOSIS — F17210 Nicotine dependence, cigarettes, uncomplicated: Secondary | ICD-10-CM | POA: Diagnosis present

## 2023-01-28 DIAGNOSIS — D72825 Bandemia: Secondary | ICD-10-CM | POA: Diagnosis present

## 2023-01-28 DIAGNOSIS — E785 Hyperlipidemia, unspecified: Secondary | ICD-10-CM

## 2023-01-28 DIAGNOSIS — R55 Syncope and collapse: Secondary | ICD-10-CM | POA: Diagnosis present

## 2023-01-28 DIAGNOSIS — W109XXA Fall (on) (from) unspecified stairs and steps, initial encounter: Secondary | ICD-10-CM | POA: Diagnosis present

## 2023-01-28 DIAGNOSIS — Z8711 Personal history of peptic ulcer disease: Secondary | ICD-10-CM | POA: Diagnosis not present

## 2023-01-28 DIAGNOSIS — M50221 Other cervical disc displacement at C4-C5 level: Secondary | ICD-10-CM | POA: Diagnosis present

## 2023-01-28 DIAGNOSIS — K219 Gastro-esophageal reflux disease without esophagitis: Secondary | ICD-10-CM | POA: Diagnosis present

## 2023-01-28 DIAGNOSIS — Z6841 Body Mass Index (BMI) 40.0 and over, adult: Secondary | ICD-10-CM | POA: Diagnosis not present

## 2023-01-28 DIAGNOSIS — R000 Tachycardia, unspecified: Secondary | ICD-10-CM | POA: Diagnosis present

## 2023-01-28 DIAGNOSIS — T380X5A Adverse effect of glucocorticoids and synthetic analogues, initial encounter: Secondary | ICD-10-CM | POA: Diagnosis present

## 2023-01-28 DIAGNOSIS — Z833 Family history of diabetes mellitus: Secondary | ICD-10-CM | POA: Diagnosis not present

## 2023-01-28 DIAGNOSIS — E1165 Type 2 diabetes mellitus with hyperglycemia: Secondary | ICD-10-CM | POA: Diagnosis present

## 2023-01-28 DIAGNOSIS — S14123A Central cord syndrome at C3 level of cervical spinal cord, initial encounter: Secondary | ICD-10-CM | POA: Diagnosis present

## 2023-01-28 DIAGNOSIS — R609 Edema, unspecified: Secondary | ICD-10-CM | POA: Diagnosis present

## 2023-01-28 DIAGNOSIS — M47812 Spondylosis without myelopathy or radiculopathy, cervical region: Secondary | ICD-10-CM | POA: Diagnosis present

## 2023-01-28 DIAGNOSIS — Z8249 Family history of ischemic heart disease and other diseases of the circulatory system: Secondary | ICD-10-CM | POA: Diagnosis not present

## 2023-01-28 DIAGNOSIS — I10 Essential (primary) hypertension: Secondary | ICD-10-CM | POA: Diagnosis present

## 2023-01-28 DIAGNOSIS — F411 Generalized anxiety disorder: Secondary | ICD-10-CM | POA: Diagnosis present

## 2023-01-28 DIAGNOSIS — M5021 Other cervical disc displacement,  high cervical region: Secondary | ICD-10-CM | POA: Diagnosis present

## 2023-01-28 DIAGNOSIS — R059 Cough, unspecified: Secondary | ICD-10-CM | POA: Diagnosis present

## 2023-01-28 DIAGNOSIS — F419 Anxiety disorder, unspecified: Secondary | ICD-10-CM | POA: Diagnosis present

## 2023-01-28 DIAGNOSIS — S14129A Central cord syndrome at unspecified level of cervical spinal cord, initial encounter: Secondary | ICD-10-CM | POA: Diagnosis not present

## 2023-01-28 LAB — CBC
HCT: 38.1 % (ref 36.0–46.0)
Hemoglobin: 12.4 g/dL (ref 12.0–15.0)
MCH: 30.5 pg (ref 26.0–34.0)
MCHC: 32.5 g/dL (ref 30.0–36.0)
MCV: 93.8 fL (ref 80.0–100.0)
Platelets: 281 10*3/uL (ref 150–400)
RBC: 4.06 MIL/uL (ref 3.87–5.11)
RDW: 13.8 % (ref 11.5–15.5)
WBC: 19 10*3/uL — ABNORMAL HIGH (ref 4.0–10.5)
nRBC: 0 % (ref 0.0–0.2)

## 2023-01-28 LAB — BASIC METABOLIC PANEL
Anion gap: 12 (ref 5–15)
BUN: 26 mg/dL — ABNORMAL HIGH (ref 6–20)
CO2: 21 mmol/L — ABNORMAL LOW (ref 22–32)
Calcium: 9.3 mg/dL (ref 8.9–10.3)
Chloride: 102 mmol/L (ref 98–111)
Creatinine, Ser: 0.91 mg/dL (ref 0.44–1.00)
GFR, Estimated: >60 mL/min (ref >60)
Glucose, Bld: 168 mg/dL — ABNORMAL HIGH (ref 70–99)
Potassium: 4.3 mmol/L (ref 3.5–5.1)
Sodium: 135 mmol/L (ref 135–145)

## 2023-01-28 LAB — GLUCOSE, CAPILLARY
Glucose-Capillary: 232 mg/dL — ABNORMAL HIGH (ref 70–99)
Glucose-Capillary: 197 mg/dL — ABNORMAL HIGH (ref 70–99)
Glucose-Capillary: 162 mg/dL — ABNORMAL HIGH (ref 70–99)

## 2023-01-28 MED ORDER — INSULIN ASPART 100 UNIT/ML IJ SOLN
0.0000 [IU] | Freq: Every day | INTRAMUSCULAR | Status: DC
  Administered 2023-01-29: 2 [IU] via SUBCUTANEOUS

## 2023-01-28 MED ORDER — INSULIN ASPART 100 UNIT/ML IJ SOLN: 0.0000 [IU] | Freq: Three times a day (TID) | INTRAMUSCULAR | Status: DC

## 2023-01-28 MED ORDER — GABAPENTIN 300 MG PO CAPS
900.0000 mg | ORAL_CAPSULE | Freq: Three times a day (TID) | ORAL | Status: DC
  Administered 2023-01-28 – 2023-01-31 (×9): 900 mg via ORAL
  Filled 2023-01-28 (×9): qty 3

## 2023-01-28 MED ORDER — INSULIN ASPART 100 UNIT/ML IJ SOLN: 0.0000 [IU] | Freq: Every day | INTRAMUSCULAR | Status: DC

## 2023-01-28 MED ORDER — CYCLOBENZAPRINE HCL 10 MG PO TABS
5.0000 mg | ORAL_TABLET | Freq: Three times a day (TID) | ORAL | Status: DC | PRN
  Administered 2023-01-28 – 2023-01-30 (×5): 5 mg via ORAL
  Filled 2023-01-28 (×5): qty 1

## 2023-01-28 MED ORDER — GABAPENTIN 300 MG PO CAPS
600.0000 mg | ORAL_CAPSULE | Freq: Three times a day (TID) | ORAL | Status: DC
  Administered 2023-01-28: 600 mg via ORAL
  Filled 2023-01-28: qty 2

## 2023-01-28 MED ORDER — INSULIN ASPART 100 UNIT/ML IJ SOLN
4.0000 [IU] | Freq: Three times a day (TID) | INTRAMUSCULAR | Status: DC
  Administered 2023-01-28 – 2023-01-29 (×4): 4 [IU] via SUBCUTANEOUS

## 2023-01-28 MED ORDER — INSULIN ASPART 100 UNIT/ML IJ SOLN
0.0000 [IU] | Freq: Three times a day (TID) | INTRAMUSCULAR | Status: DC
  Administered 2023-01-28: 3 [IU] via SUBCUTANEOUS
  Administered 2023-01-28: 5 [IU] via SUBCUTANEOUS
  Administered 2023-01-29: 2 [IU] via SUBCUTANEOUS
  Administered 2023-01-29: 8 [IU] via SUBCUTANEOUS
  Administered 2023-01-29: 5 [IU] via SUBCUTANEOUS
  Administered 2023-01-30 (×2): 2 [IU] via SUBCUTANEOUS
  Administered 2023-01-30: 5 [IU] via SUBCUTANEOUS
  Administered 2023-01-31: 2 [IU] via SUBCUTANEOUS
  Administered 2023-01-31: 3 [IU] via SUBCUTANEOUS

## 2023-01-28 MED ORDER — INSULIN GLARGINE-YFGN 100 UNIT/ML ~~LOC~~ SOLN
15.0000 [IU] | Freq: Every day | SUBCUTANEOUS | Status: DC
  Administered 2023-01-28 – 2023-01-29 (×2): 15 [IU] via SUBCUTANEOUS
  Filled 2023-01-28 (×2): qty 0.15

## 2023-01-28 NOTE — Progress Notes (Signed)
   Providing Compassionate, Quality Care - Together  NEUROSURGERY PROGRESS NOTE   S: No issues overnight.  Continues to complain of pins-and-needles in her hands, strength is improved  O: EXAM:  BP (!) 105/59 (BP Location: Right Arm)   Pulse 89   Temp 98 F (36.7 C) (Oral)   Resp 20   Ht 5\' 5"  (1.651 m)   Wt 123.8 kg   SpO2 93%   BMI 45.43 kg/m   Awake alert oriented x 3, tearful PERRLA Cranial nerves II through XII intact Cervical collar in place Nonlabored breathing Bilateral lower extremities full strength throughout Bilateral upper extremities grip 4 -/5, bicep 4 -/5, tricep 4 -/5, deltoids 4/5 Dysesthetic pain bilateral upper extremities and hyperesthesia     Impression/Assessment:  52 year old female with   Multilevel cervical spondylosis with mild central cord syndrome Syncopal event, unknown etiology  Plan: -Increasing gabapentin to 600 mg 3 times daily -Continue steroids -Given her continued improvement in strength, the patient agrees with conservative treatment -PT/OT evaluation and treatment  Thank you for allowing me to participate in this patient's care.  Please do not hesitate to call with questions or concerns.   Monia Pouch, DO Neurosurgeon [facility] Cell: [phone]

## 2023-01-28 NOTE — Evaluation (Signed)
Occupational Therapy Evaluation Patient Details Name: Brenda Taylor MRN: [id number] DOB: 1971-04-14 Today's Date: 01/28/2023   History of Present Illness Pt is a 52 y/o female presenting after a syncopal episode and fall down front porch steps. MRI+ for central cord syndrome. No sx intervention recommended. PMH: DDD, GERD, hernia, back surgery x5   Clinical Impression   PTA, pt lives with multiple family members, typically completely Independent and working as a Production designer, theatre/television/film at [employer]. Pt presents with deficits in pain, BUE strength/sensation and dynamic standing balance. Began education on cervical precautions with pt able to mobilize with RW and min guard. Pt requires up to Min A for ADL completion. Provided theraputty, squeeze sponge and fine motor activity HEP to further address strength/sensation of B hands. Pt reports family can provide light assist at DC. Recommend consideration of OP OT at DC       Recommendations for follow up therapy are one component of a multi-disciplinary discharge planning process, led by the attending physician.  Recommendations may be updated based on patient status, additional functional criteria and insurance authorization.   Assistance Recommended at Discharge Intermittent Supervision/Assistance  Patient can return home with the following A little help with walking and/or transfers;A little help with bathing/dressing/bathroom;Assistance with cooking/housework;Assist for transportation;Help with stairs or ramp for entrance    Functional Status Assessment  Patient has had a recent decline in their functional status and demonstrates the ability to make significant improvements in function in a reasonable and predictable amount of time.  Equipment Recommendations  Other (comment);BSC/3in1 (RW)    Recommendations for Other Services       Precautions / Restrictions Precautions Precautions: Fall;Cervical Required Braces or Orthoses: Cervical  Brace Cervical Brace: Hard collar Restrictions Weight Bearing Restrictions: No      Mobility Bed Mobility Overal bed mobility: Needs Assistance Bed Mobility: Rolling, Sidelying to Sit Rolling: Supervision Sidelying to sit: Min assist       General bed mobility comments: light Min A to lift/stabilize trunk    Transfers Overall transfer level: Needs assistance Equipment used: Rolling walker (2 wheels) Transfers: Sit to/from Stand Sit to Stand: Min assist           General transfer comment: able to stand on second attempt      Balance Overall balance assessment: Needs assistance Sitting-balance support: Feet supported, No upper extremity supported Sitting balance-Leahy Scale: Good     Standing balance support: Bilateral upper extremity supported, During functional activity Standing balance-Leahy Scale: Poor                             ADL either performed or assessed with clinical judgement   ADL Overall ADL's : Needs assistance/impaired Eating/Feeding: Set up Eating/Feeding Details (indicate cue type and reason): assist to open containers but able to bring reg utensil to mouth. provided built up foam grips if pt with difficulty during task Grooming: Standing;Minimal assistance   Upper Body Bathing: Minimal assistance;Sitting   Lower Body Bathing: Minimal assistance;Sit to/from stand   Upper Body Dressing : Minimal assistance;Sitting   Lower Body Dressing: Minimal assistance;Sit to/from stand   Toilet Transfer: Min guard;Ambulation;Rolling walker (2 wheels)   Toileting- Clothing Manipulation and Hygiene: Minimal assistance;Sit to/from stand;Sitting/lateral lean       Functional mobility during ADLs: Min guard;Rolling walker (2 wheels) General ADL Comments: limited by B shoulder pain, impaired BUE strength and recent hx of LLE deficits though able to mobilize in room,  participate in basic UE exercises.     Vision Baseline Vision/History: 1  Wears glasses Ability to See in Adequate Light: 0 Adequate Patient Visual Report: No change from baseline Vision Assessment?: No apparent visual deficits     Perception     Praxis      Pertinent Vitals/Pain Pain Assessment Pain Assessment: Faces Faces Pain Scale: Hurts even more Pain Location: B shoulders (R > L) Pain Descriptors / Indicators: Grimacing, Guarding, Sharp, Pins and needles Pain Intervention(s): Monitored during session, Limited activity within patient's tolerance, Premedicated before session     Hand Dominance Right   Extremity/Trunk Assessment Upper Extremity Assessment Upper Extremity Assessment: RUE deficits/detail;LUE deficits/detail RUE Deficits / Details: B pins and needles of B hands, 3-/5 grasp, opposition WFL though slow. only able to raise UE to approx 90* due to pain. Provided squeeze sponge, theraputty and fine motor activity HEP RUE Sensation: decreased light touch   Lower Extremity Assessment Lower Extremity Assessment: Defer to PT evaluation   Cervical / Trunk Assessment Cervical / Trunk Assessment: Normal   Communication Communication Communication: No difficulties   Cognition Arousal/Alertness: Awake/alert Behavior During Therapy: WFL for tasks assessed/performed Overall Cognitive Status: Within Functional Limits for tasks assessed                                 General Comments: occasionally would fall asleep during PLOF questions     General Comments       Exercises     Shoulder Instructions      Home Living Family/patient expects to be discharged to:: Private residence Living Arrangements: Spouse/significant other;Children;Other relatives (3 grandchildren) Available Help at Discharge: Family Type of Home: House Home Access: Stairs to enter [employer] of Steps: 6 at side steps, unable to recall how many at front   Home Layout: One level     Bathroom Shower/Tub: Contractor: Standard     Home Equipment: Cane - single point          Prior Functioning/Environment Prior Level of Function : Independent/Modified Independent;Driving;Working/employed             Mobility Comments: no AD, chronic LLE deficits (planned to start OP therapy end of July) 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[employer] Problem List: Decreased strength;Decreased activity tolerance;Decreased coordination;Impaired balance (sitting and/or standing);Decreased knowledge of use of DME or AE;Decreased knowledge of precautions;Pain;Impaired UE functional use;Impaired sensation      OT Treatment/Interventions: Self-care/ADL training;Therapeutic exercise;Energy conservation;DME and/or AE instruction;Therapeutic activities;Balance training;Patient/family education    OT Goals(Current goals can be found in the care plan section) Acute Rehab OT Goals Patient Stated Goal: decrease pain OT Goal Formulation: With patient Time For Goal Achievement: 02/11/23 Potential to Achieve Goals: Good  OT Frequency: Min 2X/week    Co-evaluation              AM-PAC OT "6 Clicks" Daily Activity     Outcome Measure Help from another person eating meals?: A Little Help from another person taking care of personal grooming?: A Little Help from another person toileting, which includes using toliet, bedpan, or urinal?: A Little Help from another person bathing (including washing, rinsing, drying)?: A Little Help from another person to put on and taking off regular upper body clothing?: A Little Help from another person to put on and taking off regular lower body clothing?: A  Little 6 Click Score: 18   End of Session Equipment Utilized During Treatment: Rolling walker (2 wheels);Cervical collar Nurse Communication: Mobility status  Activity Tolerance: Patient tolerated treatment well Patient left: in chair;with call bell/phone within reach;with chair alarm set  OT Visit  Diagnosis: Unsteadiness on feet (R26.81);Other abnormalities of gait and mobility (R26.89);Muscle weakness (generalized) (M62.81)                Time: 1610-9604 OT Time Calculation (min): 34 min Charges:  OT General Charges $OT Visit: 1 Visit OT Evaluation $OT Eval Moderate Complexity: 1 Mod OT Treatments $Self Care/Home Management : 8-22 mins  Bradd Canary, OTR/L Acute Rehab Services Office: [phone]   Lorre Munroe 01/28/2023, 9:20 AM

## 2023-01-28 NOTE — Evaluation (Signed)
Physical Therapy Evaluation Patient Details Name: Brenda Taylor MRN: [id number] DOB: 1970/08/12 Today's Date: 01/28/2023  History of Present Illness  Pt is a 52 y/o female presenting after a syncopal episode and fall down front porch steps. MRI+ for central cord syndrome. No sx intervention recommended. PMH: DDD, GERD, hernia, back surgery x5  Clinical Impression  Pt admitted with above diagnosis. Pt from home with family and has good support. Education today included cervical precautions, proper posture, and smoking cessation. Pt mobilizing with min guard A. She began ambulation with RW but was having increased shoulder pain. Tried standup rollator and her posture was better, she was safer, and had less shoulder pain. Recommend this device for home as well as outpt PT.  Pt currently with functional limitations due to the deficits listed below (see PT Problem List). Pt will benefit from acute skilled PT to increase their independence and safety with mobility to allow discharge.           Assistance Recommended at Discharge Intermittent Supervision/Assistance  If plan is discharge home, recommend the following:  Can travel by private vehicle  A little help with walking and/or transfers;A little help with bathing/dressing/bathroom;Assistance with cooking/housework;Assist for transportation;Help with stairs or ramp for entrance        Equipment Recommendations Other (comment) (standup rollator)  Recommendations for Other Services       Functional Status Assessment Patient has had a recent decline in their functional status and demonstrates the ability to make significant improvements in function in a reasonable and predictable amount of time.     Precautions / Restrictions Precautions Precautions: Fall;Cervical Precaution Comments: reviewed precautions Required Braces or Orthoses: Cervical Brace Cervical Brace: Hard collar;Other (comment) (may take off in bed and to shower as well as to  go to bathroom) Restrictions Weight Bearing Restrictions: No      Mobility  Bed Mobility Overal bed mobility: Needs Assistance Bed Mobility: Rolling, Sidelying to Sit, Sit to Supine Rolling: Supervision Sidelying to sit: Min guard   Sit to supine: Min guard   General bed mobility comments: pt able to come to EOB without physical assist from elevated HOB.    Transfers Overall transfer level: Needs assistance Equipment used: Rolling walker (2 wheels) Transfers: Sit to/from Stand Sit to Stand: Min assist           General transfer comment: min A for safety, able to stand without use of hands    Ambulation/Gait Ambulation/Gait assistance: Min assist, Min guard Gait Distance (Feet): 300 Feet Assistive device: Rolling walker (2 wheels) (standup rollator) Gait Pattern/deviations: Step-through pattern Gait velocity: decreased Gait velocity interpretation: <1.8 ft/sec, indicate of risk for recurrent falls   General Gait Details: min A with RW and pt reported increased pain central and R sided neck. Obtained standup rollator and she progressed to min guard and reported slightly less pain  Stairs            Wheelchair Mobility     Tilt Bed    Modified Rankin (Stroke Patients Only)       Balance Overall balance assessment: Needs assistance Sitting-balance support: Feet supported, No upper extremity supported Sitting balance-Leahy Scale: Good     Standing balance support: Bilateral upper extremity supported, During functional activity Standing balance-Leahy Scale: Poor Standing balance comment: reliant on UE support                             Pertinent Vitals/Pain Pain  Assessment Pain Assessment: Faces Faces Pain Scale: Hurts even more Pain Location: B shoulders (R > L) Pain Descriptors / Indicators: Grimacing, Guarding, Sharp Pain Intervention(s): Premedicated before session, Monitored during session, Limited activity within patient's  tolerance    Home Living Family/patient expects to be discharged to:: Private residence Living Arrangements: Spouse/significant other;Children;Other relatives (3 grandchildren) Available Help at Discharge: Family Type of Home: House Home Access: Stairs to enter   [employer] of Steps: 6 at side steps, unable to recall how many at front   Home Layout: One level Home Equipment: Gilmer Mor - single point Additional Comments: pt has custody of 3 grandchildren but has other family support    Prior Function Prior Level of Function : Independent/Modified Independent;Driving;Working/employed             Mobility Comments: no AD, chronic LLE deficits (planned to start OP therapy end of July) 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[employer]   Dominant Hand: Right    Extremity/Trunk Assessment   Upper Extremity Assessment Upper Extremity Assessment: Defer to OT evaluation    Lower Extremity Assessment Lower Extremity Assessment: LLE deficits/detail LLE Deficits / Details: was about to start PT for LLE dysfunction, reports occasional cramping and spasming LLE Sensation: decreased proprioception LLE Coordination: decreased gross motor    Cervical / Trunk Assessment Cervical / Trunk Assessment: Normal  Communication   Communication: No difficulties  Cognition Arousal/Alertness: Awake/alert Behavior During Therapy: WFL for tasks assessed/performed Overall Cognitive Status: Within Functional Limits for tasks assessed                                 General Comments: sleepy when not talking and active        General Comments General comments (skin integrity, edema, etc.): reviewed proper posture and abdominal activation with activity    Exercises     Assessment/Plan    PT Assessment Patient needs continued PT services  PT Problem List Decreased strength;Decreased activity tolerance;Decreased balance;Decreased  mobility;Decreased coordination;Decreased knowledge of precautions;Decreased knowledge of use of DME;Pain       PT Treatment Interventions DME instruction;Gait training;Stair training;Functional mobility training;Therapeutic activities;Therapeutic exercise;Balance training;Neuromuscular re-education;Patient/family education    PT Goals (Current goals can be found in the Care Plan section)  Acute Rehab PT Goals Patient Stated Goal: return home PT Goal Formulation: With patient Time For Goal Achievement: 02/11/23 Potential to Achieve Goals: Good    Frequency Min 5X/week     Co-evaluation               AM-PAC PT "6 Clicks" Mobility  Outcome Measure Help needed turning from your back to your side while in a flat bed without using bedrails?: A Little Help needed moving from lying on your back to sitting on the side of a flat bed without using bedrails?: A Little Help needed moving to and from a bed to a chair (including a wheelchair)?: A Little Help needed standing up from a chair using your arms (e.g., wheelchair or bedside chair)?: A Little Help needed to walk in hospital room?: A Little Help needed climbing 3-5 steps with a railing? : A Lot 6 Click Score: 17    End of Session Equipment Utilized During Treatment: Gait belt;Cervical collar Activity Tolerance: Patient tolerated treatment well Patient left: in bed;with call bell/phone within reach;with bed alarm set Nurse Communication: Mobility status PT Visit Diagnosis: Unsteadiness on feet (R26.81);Muscle  weakness (generalized) (M62.81);Difficulty in walking, not elsewhere classified (R26.2);Pain Pain - Right/Left:  (bilateral) Pain - part of body: Shoulder (neck)    Time: 1413-1450 PT Time Calculation (min) (ACUTE ONLY): 37 min   Charges:   PT Evaluation $PT Eval Moderate Complexity: 1 Mod PT Treatments $Gait Training: 8-22 mins PT General Charges $$ ACUTE PT VISIT: 1 Visit         Lyanne Co, PT  Acute  Rehab Services Secure chat preferred Office [phone]   Lawana Chambers Nayanna Seaborn 01/28/2023, 3:33 PM

## 2023-01-28 NOTE — Plan of Care (Signed)
  Problem: Activity: Goal: Ability to perform activities at highest level will improve Outcome: Progressing Goal: Muscle strength will improve Outcome: Progressing   Problem: Bowel/Gastric: Goal: Ability to demonstrate the techniques of an individualized bowel program will improve Outcome: Progressing   Problem: Education: Goal: Knowledge of disease or condition will improve Outcome: Progressing Goal: Knowledge of the prescribed therapeutic regimen will improve Outcome: Progressing   Problem: Coping: Goal: Ability to identify and develop effective coping behavior will improve Outcome: Progressing

## 2023-01-28 NOTE — Progress Notes (Signed)
PROGRESS NOTE  Brenda Taylor:[id number] DOB: 1970-09-08   PCP: Anabel Halon, MD  Patient is from: Home  DOA: 01/26/2023 LOS: 0  Chief complaints Chief Complaint  Patient presents with   Fall     Brief Narrative / Interim history: 52 year old F with PMH of DM-2, HTN, tobacco use disorder, GERD, morbid obesity and DDD with prior cervical and lumbar spine surgeries brought to ED by EMS after syncopal episode following severe cough while sitting on the porch and smoking cigarettes.  Fell backward with LOC.  When she woke up, not able to move her arms and legs and EMS was called.  She presented to [facility] ED and transferred to [facility] for MRI.   In ED, she was in severe pain and bilateral shoulder and weakness in all extremities.  Vital stable.  BMP and CBC without significant finding other than mild leukocytosis.  EKG showed sinus tachycardia.  CT head, CT cervical spine, CXR, bilateral shoulder x-ray without acute finding.  MRI cervical spine showed soft tissue injury with prevertebral effusion and posterior strain at C2-C5, mild cord edema rightward at the level of C3-4 where there is a disc protrusion flattening the ventral cord and additional disc protrusions with cord contact at C2-3 and C4-5.  Neurosurgery consulted and recommended Decadron, neck collar and outpatient follow-up.   Subjective: Seen and examined earlier this morning.  No major events overnight of this morning.  Continues to endorse bilateral shoulder pain that she rates as severe.  She also reports numbness and tingling in her fingers and toes mainly on the left side.  Also weakness in left arm and left leg.  Denies dizziness, chest pain or shortness of breath.  Objective: Vitals:   01/28/23 0010 01/28/23 0443 01/28/23 0717 01/28/23 1103  BP: 103/71 125/89 (!) 105/59 112/69  Pulse: 92 78 89 68  Resp: 18 18 20 16   Temp: 98 F (36.7 C) 98 F (36.7 C) 98 F (36.7 C) 98.1 F (36.7 C)  TempSrc: Oral Oral Oral Oral  SpO2:  93% 90% 93% 92%  Weight:      Height:        Examination:  GENERAL: No apparent distress.  Nontoxic. HEENT: MMM.  Vision and hearing grossly intact.  NECK: in neck collar RESP:  No IWOB.  Fair aeration bilaterally. CVS:  RRR. Heart sounds normal.  ABD/GI/GU: BS+. Abd soft, NTND.  MSK/EXT:  Moves extremities. No apparent deformity. No edema.  SKIN: no apparent skin lesion or wound NEURO: Awake, alert and oriented appropriately.  Diminished motor strength this in left arm and leg (3/5).  Paresthesia in left arm and left leg but not dermatomal. PSYCH: Calm. Normal affect.   Procedures:  None  Microbiology summarized: None  Assessment and plan: Principal Problem:   Syncope Active Problems:   Central cord syndrome (HCC)   Hypertension   Type 2 diabetes mellitus with hyperlipidemia (HCC)   GAD (generalized anxiety disorder)   Obesity (BMI 30-39.9)   Tobacco abuse  Vasovagal syncope: Following severe coughing spell while smoking cigarettes.  EKG without significant finding. -Encouraged smoking cessation -Discontinue lisinopril on discharge.   Central cord syndrome: Patient with known history of DDD s/p cervical and lumbar vertebral surgeries in the past.  She is followed by orthopedic surgery outpatient.  MRI cervical spine showed soft tissue injury with prevertebral effusion and posterior strain at C2-C5, mild cord edema rightward at the level of C3-4 where there is a disc protrusion flattening the ventral cord and  additional disc protrusions with cord contact at C2-3 and C4-5.   -Neurosurgery consulted and recommended Decadron, neck collar and outpatient follow-up. -Continue Decadron 4 mg every 6 hours -Increase gabapentin to 900 mg 3 times daily -Continue Tylenol, oxycodone and IV Dilaudid as needed -PT/OT      Controlled NIDDM-2 with hyperglycemia: Last A1c 6.5% on 4/23.  Hyperglycemia likely due to steroid.  On metformin at home Recent Labs  Lab 01/27/23 0004  01/28/23 1104  GLUCAP 143* 232*  -Start SSI-moderate -NovoLog 4 units 3 times daily with meals -Semglee 15 units daily -Further adjustment as appropriate -Continue statin  Essential hypertension: Normotensive -Discontinue lisinopril on discharge due to cough -Continue HCTZ  GAD (generalized anxiety disorder) -Continue with buspirone and alprazolam.    History of DDD/cervical lumbar spine surgeries: Followed outpatient by orthopedic surgery. -Pain management as above -Outpatient follow-up as above   Tobacco abuse: About 40-pack-year history.  Determined to quit. -Encourage cessation -Continue nicotine patch  GERD -PPI  Leukocytosis/bandemia: Likely demargination from steroid  Morbid obesity: Elevated BMI with comorbidity as above Body mass index is 45.43 kg/m. -Consider GLP-1 agonist -Lifestyle change to lose weight          DVT prophylaxis:  SCDs Start: 01/27/23 1515  Code Status: Full code Family Communication: None at bedside Level of care: Telemetry Medical Status is: Observation The patient will require care spanning > 2 midnights and should be moved to inpatient because: Central cord syndrome, pain   Final disposition: TBD Consultants:  Neurosurgery  55 minutes with more than 50% spent in reviewing records, counseling patient/family and coordinating care.   Sch Meds:  Scheduled Meds:  busPIRone  15 mg Oral Daily   dexamethasone (DECADRON) injection  4 mg Intravenous Q6H   enoxaparin (LOVENOX) injection  60 mg Subcutaneous Q24H   gabapentin  900 mg Oral TID   hydrochlorothiazide  25 mg Oral Daily   insulin aspart  0-15 Units Subcutaneous TID WC   insulin aspart  0-5 Units Subcutaneous QHS   insulin aspart  4 Units Subcutaneous TID WC   insulin glargine-yfgn  15 Units Subcutaneous Daily   olopatadine  1 drop Both Eyes BID   pantoprazole  40 mg Oral BID AC   rosuvastatin  5 mg Oral Daily   Continuous Infusions: PRN Meds:.acetaminophen **OR**  acetaminophen, ALPRAZolam, HYDROmorphone (DILAUDID) injection, ondansetron **OR** ondansetron (ZOFRAN) IV, oxyCODONE  Antimicrobials: Anti-infectives (From admission, onward)    None        I have personally reviewed the following labs and images: CBC: Recent Labs  Lab 01/27/23 0056 01/27/23 2120 01/28/23 0627  WBC 12.3* 18.3* 19.0*  NEUTROABS 7.9*  --   --   HGB 11.6* 12.6 12.4  HCT 36.2 38.6 38.1  MCV 94.0 90.6 93.8  PLT 239 319 281   BMP &GFR Recent Labs  Lab 01/27/23 0056 01/27/23 2120 01/28/23 0627  NA 135  --  135  K 3.6  --  4.3  CL 102  --  102  CO2 22  --  21*  GLUCOSE 153*  --  168*  BUN 28*  --  26*  CREATININE 1.04* 1.07* 0.91  CALCIUM 8.8*  --  9.3   Estimated Creatinine Clearance: 93.4 mL/min (by C-G formula based on SCr of 0.91 mg/dL). Liver & Pancreas: Recent Labs  Lab 01/27/23 0056  AST 19  ALT 20  ALKPHOS 80  BILITOT 0.5  PROT 7.6  ALBUMIN 3.6   No results for input(s): "LIPASE", "AMYLASE" in  the last 168 hours. No results for input(s): "AMMONIA" in the last 168 hours. Diabetic: No results for input(s): "HGBA1C" in the last 72 hours. Recent Labs  Lab 01/27/23 0004 01/28/23 1104  GLUCAP 143* 232*   Cardiac Enzymes: No results for input(s): "CKTOTAL", "CKMB", "CKMBINDEX", "TROPONINI" in the last 168 hours. No results for input(s): "PROBNP" in the last 8760 hours. Coagulation Profile: No results for input(s): "INR", "PROTIME" in the last 168 hours. Thyroid Function Tests: No results for input(s): "TSH", "T4TOTAL", "FREET4", "T3FREE", "THYROIDAB" in the last 72 hours. Lipid Profile: No results for input(s): "CHOL", "HDL", "LDLCALC", "TRIG", "CHOLHDL", "LDLDIRECT" in the last 72 hours. Anemia Panel: No results for input(s): "VITAMINB12", "FOLATE", "FERRITIN", "TIBC", "IRON", "RETICCTPCT" in the last 72 hours. Urine analysis:    Component Value Date/Time   COLORURINE YELLOW 03/11/2015 1320   APPEARANCEUR CLEAR 03/11/2015 1320    LABSPEC 1.015 03/11/2015 1320   PHURINE 7.0 03/11/2015 1320   GLUCOSEU NEGATIVE 03/11/2015 1320   HGBUR NEGATIVE 03/11/2015 1320   BILIRUBINUR NEGATIVE 03/11/2015 1320   KETONESUR NEGATIVE 03/11/2015 1320   PROTEINUR NEGATIVE 03/11/2015 1320   UROBILINOGEN 0.2 03/11/2015 1320   NITRITE NEGATIVE 03/11/2015 1320   LEUKOCYTESUR NEGATIVE 03/11/2015 1320   Sepsis Labs: Invalid input(s): "PROCALCITONIN", "LACTICIDVEN"  Microbiology: No results found for this or any previous visit (from the past 240 hour(s)).  Radiology Studies: No results found.    Shanedra Lave T. Rosaire Cueto [facility]  If 7PM-7AM, please contact night-coverage [email or web address] 01/28/2023, 11:49 AM

## 2023-01-29 DIAGNOSIS — E1169 Type 2 diabetes mellitus with other specified complication: Secondary | ICD-10-CM | POA: Diagnosis not present

## 2023-01-29 DIAGNOSIS — S14129A Central cord syndrome at unspecified level of cervical spinal cord, initial encounter: Secondary | ICD-10-CM | POA: Diagnosis not present

## 2023-01-29 DIAGNOSIS — R55 Syncope and collapse: Secondary | ICD-10-CM | POA: Diagnosis not present

## 2023-01-29 DIAGNOSIS — I10 Essential (primary) hypertension: Secondary | ICD-10-CM | POA: Diagnosis not present

## 2023-01-29 LAB — CBC
HCT: 34.5 % — ABNORMAL LOW (ref 36.0–46.0)
Hemoglobin: 11.3 g/dL — ABNORMAL LOW (ref 12.0–15.0)
MCH: 30.2 pg (ref 26.0–34.0)
MCHC: 32.8 g/dL (ref 30.0–36.0)
MCV: 92.2 fL (ref 80.0–100.0)
Platelets: 283 10*3/uL (ref 150–400)
RBC: 3.74 MIL/uL — ABNORMAL LOW (ref 3.87–5.11)
RDW: 13.9 % (ref 11.5–15.5)
WBC: 18.2 10*3/uL — ABNORMAL HIGH (ref 4.0–10.5)
nRBC: 0 % (ref 0.0–0.2)

## 2023-01-29 LAB — GLUCOSE, CAPILLARY
Glucose-Capillary: 148 mg/dL — ABNORMAL HIGH (ref 70–99)
Glucose-Capillary: 267 mg/dL — ABNORMAL HIGH (ref 70–99)
Glucose-Capillary: 202 mg/dL — ABNORMAL HIGH (ref 70–99)
Glucose-Capillary: 214 mg/dL — ABNORMAL HIGH (ref 70–99)

## 2023-01-29 LAB — RENAL FUNCTION PANEL
Albumin: 3.3 g/dL — ABNORMAL LOW (ref 3.5–5.0)
Anion gap: 9 (ref 5–15)
BUN: 35 mg/dL — ABNORMAL HIGH (ref 6–20)
CO2: 24 mmol/L (ref 22–32)
Calcium: 9.2 mg/dL (ref 8.9–10.3)
Chloride: 103 mmol/L (ref 98–111)
Creatinine, Ser: 1.18 mg/dL — ABNORMAL HIGH (ref 0.44–1.00)
GFR, Estimated: 55 mL/min — ABNORMAL LOW (ref >60)
Glucose, Bld: 159 mg/dL — ABNORMAL HIGH (ref 70–99)
Phosphorus: 3.8 mg/dL (ref 2.5–4.6)
Potassium: 4.3 mmol/L (ref 3.5–5.1)
Sodium: 136 mmol/L (ref 135–145)

## 2023-01-29 LAB — HEMOGLOBIN A1C
Hgb A1c MFr Bld: 6.2 % — ABNORMAL HIGH (ref 4.8–5.6)
Mean Plasma Glucose: 131 mg/dL

## 2023-01-29 LAB — MAGNESIUM: Magnesium: 2 mg/dL (ref 1.7–2.4)

## 2023-01-29 MED ORDER — INSULIN GLARGINE-YFGN 100 UNIT/ML ~~LOC~~ SOLN
15.0000 [IU] | Freq: Two times a day (BID) | SUBCUTANEOUS | Status: DC
  Administered 2023-01-29 – 2023-01-31 (×4): 15 [IU] via SUBCUTANEOUS
  Filled 2023-01-29 (×5): qty 0.15

## 2023-01-29 MED ORDER — INSULIN ASPART 100 UNIT/ML IJ SOLN
6.0000 [IU] | Freq: Three times a day (TID) | INTRAMUSCULAR | Status: DC
  Administered 2023-01-29 – 2023-01-31 (×6): 6 [IU] via SUBCUTANEOUS

## 2023-01-29 NOTE — Progress Notes (Signed)
Physical Therapy Treatment Patient Details Name: Brenda Taylor MRN: [id number] DOB: 11/03/1970 Today's Date: 01/29/2023   History of Present Illness Pt is a 52 y/o female presenting after a syncopal episode and fall down front porch steps. MRI+ for central cord syndrome. No sx intervention recommended. PMH: DDD, GERD, hernia, back surgery x5    PT Comments  Pt was seen for extension of last PT session to downgrade to rollator, with pt verbalizing comfort to use the device.  Practiced steps as well, and noted her fatigue but tolerance for the activity.  Continue to recommend her to outpatient therapy, and progress in the mean time as tolerated to walk and stand up with independence.  Pt is requiring small reminders about safety and will have family to assist at home.  Follow goals for PT on her POC.     Assistance Recommended at Discharge Intermittent Supervision/Assistance  If plan is discharge home, recommend the following:  Can travel by private vehicle    A little help with walking and/or transfers;A little help with bathing/dressing/bathroom;Assistance with cooking/housework;Assist for transportation;Help with stairs or ramp for entrance      Equipment Recommendations  Rollator (4 wheels)    Recommendations for Other Services       Precautions / Restrictions Precautions Precautions: Fall;Cervical Required Braces or Orthoses: Cervical Brace Cervical Brace: Hard collar;Other (comment) (may take off in bed and to shower as well as to go to bathroom) Restrictions Weight Bearing Restrictions: No     Mobility  Bed Mobility Overal bed mobility: Needs Assistance             General bed mobility comments: up in chair when PT arrives    Transfers Overall transfer level: Needs assistance Equipment used: Rollator (4 wheels) Transfers: Sit to/from Stand Sit to Stand: Supervision           General transfer comment: uses chair for standing support     Ambulation/Gait Ambulation/Gait assistance: Min assist, Min guard Gait Distance (Feet): 200 Feet (100 x 2) Assistive device: Rollator (4 wheels) Gait Pattern/deviations: Step-through pattern, Wide base of support, Decreased stride length Gait velocity: decreased Gait velocity interpretation: <1.31 ft/sec, indicative of household ambulator Pre-gait activities: standing balance and posture ck General Gait Details: pt used rollator with supervision, not requiring a lot of assist on gait belt   Stairs Stairs: Yes Stairs assistance: Min guard Stair Management: Two rails, Forwards, Alternating pattern Number of Stairs: 4 General stair comments: pt demonstrates a good control of standing balance skills, noted her awareness of where to release walker and to get hand rails with steps   Wheelchair Mobility     Tilt Bed    Modified Rankin (Stroke Patients Only)       Balance Overall balance assessment: Needs assistance Sitting-balance support: Feet supported, No upper extremity supported Sitting balance-Leahy Scale: Good     Standing balance support: Bilateral upper extremity supported, During functional activity Standing balance-Leahy Scale: Fair                              Cognition Arousal/Alertness: Awake/alert Behavior During Therapy: WFL for tasks assessed/performed Overall Cognitive Status: Within Functional Limits for tasks assessed                                          Exercises  General Comments General comments (skin integrity, edema, etc.): pt used Rollator for hallway walk with supervision level, took her time and could handle steps with some level of fatigue      Pertinent Vitals/Pain Pain Assessment Pain Assessment: Faces Faces Pain Scale: Hurts a little bit Pain Location: B shoulders (R > L) Pain Descriptors / Indicators: Grimacing, Guarding, Sharp    Home Living                          Prior  Function            PT Goals (current goals can now be found in the care plan section) Progress towards PT goals: Progressing toward goals    Frequency    Min 5X/week      PT Plan Current plan remains appropriate    Co-evaluation              AM-PAC PT "6 Clicks" Mobility   Outcome Measure  Help needed turning from your back to your side while in a flat bed without using bedrails?: A Little Help needed moving from lying on your back to sitting on the side of a flat bed without using bedrails?: A Little Help needed moving to and from a bed to a chair (including a wheelchair)?: A Little Help needed standing up from a chair using your arms (e.g., wheelchair or bedside chair)?: A Little Help needed to walk in hospital room?: A Little Help needed climbing 3-5 steps with a railing? : A Little 6 Click Score: 18    End of Session Equipment Utilized During Treatment: Gait belt;Cervical collar Activity Tolerance: Patient tolerated treatment well Patient left: in chair;with call bell/phone within reach;with chair alarm set Nurse Communication: Mobility status PT Visit Diagnosis: Unsteadiness on feet (R26.81);Muscle weakness (generalized) (M62.81);Difficulty in walking, not elsewhere classified (R26.2);Pain Pain - Right/Left: Right Pain - part of body: Shoulder (and neck)     Time: 9604-5409 PT Time Calculation (min) (ACUTE ONLY): 34 min  Charges:    $Gait Training: 8-22 mins $Therapeutic Activity: 8-22 mins PT General Charges $$ ACUTE PT VISIT: 1 Visit                Ivar Drape 01/29/2023, 3:05 PM  Samul Dada, PT PhD Acute Rehab Dept. Number: [facility] [id number] and [facility] [phone]

## 2023-01-29 NOTE — TOC Initial Note (Addendum)
Transition of Care [facility]) - Initial/Assessment Note    Patient Details  Name: Brenda Taylor MRN: [id number] Date of Birth: 08/31/1970  Transition of Care [facility]) CM/SW Contact:    Kermit Balo, RN Phone Number: 01/29/2023, 1:33 PM  Clinical Narrative:                 Pt is from home with her boyfriend and daughter. She says someone is with her most of the time. No DME at home.  She manages her own medications and denies any issues.  She was driving self but says her daughter can provide transportation. Pt was already to start therapy at [facility]. CM has sent them the referral so she can start after d/c.  Awaiting PT to see what DME she will need. The recommended standup rollator is not covered by insurance. Pt is aware. TOC following.  1446: Rollator and BSC ordered through [employer] and will be delivered to the room.   Expected Discharge Plan: OP Rehab Barriers to Discharge: Continued Medical Work up   Patient Goals and CMS Choice     Choice offered to / list presented to : Patient      Expected Discharge Plan and Services   Discharge Planning Services: CM Consult   Living arrangements for the past 2 months: Single Family Home                                      Prior Living Arrangements/Services Living arrangements for the past 2 months: Single Family Home Lives with:: Significant Other, Adult Children Patient language and need for interpreter reviewed:: Yes Do you feel safe going back to the place where you live?: Yes        Care giver support system in place?: Yes (comment)   Criminal Activity/Legal Involvement Pertinent to Current Situation/Hospitalization: No - Comment as needed  Activities of Daily Living Home Assistive Devices/Equipment: None ADL Screening (condition at time of admission) Patient's cognitive ability adequate to safely complete daily activities?: Yes Is the patient deaf or have difficulty hearing?: No Does the patient have  difficulty seeing, even when wearing glasses/contacts?: No Does the patient have difficulty concentrating, remembering, or making decisions?: No Patient able to express need for assistance with ADLs?: Yes Does the patient have difficulty dressing or bathing?: No Independently performs ADLs?: No Communication: Independent Dressing (OT): Needs assistance Is this a change from baseline?: Change from baseline, expected to last >3 days Grooming: Needs assistance Is this a change from baseline?: Change from baseline, expected to last >3 days Feeding: Independent, Needs assistance Is this a change from baseline?: Change from baseline, expected to last >3 days Bathing: Needs assistance Is this a change from baseline?: Change from baseline, expected to last >3 days Toileting: Needs assistance Is this a change from baseline?: Change from baseline, expected to last >3days In/Out Bed: Needs assistance Is this a change from baseline?: Change from baseline, expected to last >3 days Walks in Home: Needs assistance Is this a change from baseline?: Change from baseline, expected to last >3 days Does the patient have difficulty walking or climbing stairs?: Yes Weakness of Legs: Both Weakness of Arms/Hands: Both  Permission Sought/Granted                  Emotional Assessment Appearance:: Appears stated age Attitude/Demeanor/Rapport: Engaged Affect (typically observed): Accepting Orientation: : Oriented to Self, Oriented to Place, Oriented  to  Time, Oriented to Situation   Psych Involvement: No (comment)  Admission diagnosis:  Syncope [R55] Central cord syndrome, initial encounter [facility]) [Z61.096E] Patient Active Problem List   Diagnosis Date Noted   Syncope 01/27/2023   Central cord syndrome (HCC) 01/27/2023   Allergic conjunctivitis of both eyes 01/02/2023   Meralgia paresthetica of left side 01/01/2023   Type 2 diabetes mellitus with hyperlipidemia (HCC) 11/14/2022   Plantar wart of  both feet 11/13/2022   Simple chronic bronchitis (HCC) 11/13/2022   Acute cough 09/13/2022   Acute sinusitis 09/13/2022   Sciatica 04/30/2022   Primary osteoarthritis of hands, bilateral 03/06/2022   Gastroesophageal reflux disease without esophagitis 03/06/2022   Restless legs syndrome (RLS) 07/18/2021   Intertrigo 05/30/2021   RUQ pain 04/10/2021   Early satiety 04/10/2021   Encounter for general adult medical examination with abnormal findings 12/15/2020   Mixed hyperlipidemia 12/15/2020   Vitamin D deficiency 09/15/2020   Osteoarthritis of right foot 07/05/2020   Right hand weakness 06/28/2020   History of fusion of cervical spine 06/28/2020   Arthritis 06/01/2020   Diabetic neuropathy (HCC) 06/01/2020   Hearing problem of right ear 05/02/2020   Obesity (BMI 30-39.9) 05/02/2020   Tobacco abuse 05/02/2020   Prediabetes 05/02/2020   Numbness and tingling in both hands 08/06/2019   GAD (generalized anxiety disorder) 05/07/2019   Hypertension 04/23/2019   HNP (herniated nucleus pulposus), lumbar 07/03/2013   Chronic idiopathic constipation 10/31/2012   PCP:  Anabel Halon, MD Pharmacy:   [facility] [place], [place] - [address] [place] [address] Phone: [phone] Fax: [phone]  [facility] [address], [place] - [address] [place] [address] [place] [address] [place] [address] Phone: [phone] Fax: [phone]     Social Determinants of Health (SDOH) Social History: SDOH Screenings   Food Insecurity: No Food Insecurity (01/27/2023)  Housing: Low Risk  (01/27/2023)  Transportation Needs: No Transportation Needs (01/27/2023)  Utilities: Not At Risk (01/27/2023)  Alcohol Screen: Low Risk  (12/26/2020)  Depression (PHQ2-9): Low Risk  (01/01/2023)  Financial Resource Strain: Medium Risk (12/26/2020)  Physical Activity: Inactive (12/26/2020)  Social Connections: Socially Isolated (12/26/2020)  Stress: Stress Concern Present (12/26/2020)   Tobacco Use: High Risk (01/27/2023)   SDOH Interventions:     Readmission Risk Interventions     No data to display

## 2023-01-29 NOTE — Progress Notes (Signed)
PROGRESS NOTE  Brenda Taylor ZOX:[id number] DOB: 09-Nov-1970   PCP: Anabel Halon, MD  Patient is from: Home  DOA: 01/26/2023 LOS: 1  Chief complaints Chief Complaint  Patient presents with   Fall     Brief Narrative / Interim history: 52 year old F with PMH of DM-2, HTN, tobacco use disorder, GERD, morbid obesity and DDD with prior cervical and lumbar spine surgeries brought to ED by EMS after syncopal episode following severe cough while sitting on the porch and smoking cigarettes.  Fell backward with LOC.  When she woke up, not able to move her arms and legs and EMS was called.  She presented to [facility] ED and transferred to [facility] for MRI.   In ED, she was in severe pain and bilateral shoulder and weakness in all extremities.  Vital stable.  BMP and CBC without significant finding other than mild leukocytosis.  EKG showed sinus tachycardia.  CT head, CT cervical spine, CXR, bilateral shoulder x-ray without acute finding.  MRI cervical spine showed soft tissue injury with prevertebral effusion and posterior strain at C2-C5, mild cord edema rightward at the level of C3-4 where there is a disc protrusion flattening the ventral cord and additional disc protrusions with cord contact at C2-3 and C4-5.  Neurosurgery consulted and recommended Decadron, neck collar and outpatient follow-up.   Subjective: Seen and examined earlier this morning.  No major events overnight of this morning.  Reports pain in his shoulders and neck.  Also reports numbness in both hands.  No complaints in lower extremities.  Objective: Vitals:   01/28/23 2350 01/29/23 0411 01/29/23 0722 01/29/23 1112  BP: 108/67 (!) 103/58 120/76 126/68  Pulse: 71 (!) 59 (!) 59 74  Resp: 18 17 17 16   Temp: 98.1 F (36.7 C)  98.7 F (37.1 C) 97.8 F (36.6 C)  TempSrc: Oral  Oral Oral  SpO2: 91%  93% 92%  Weight:      Height:        Examination:  GENERAL: No apparent distress.  Nontoxic. HEENT: MMM.  Vision and hearing grossly  intact.  NECK: in neck collar RESP:  No IWOB.  Fair aeration bilaterally. CVS:  RRR. Heart sounds normal.  ABD/GI/GU: BS+. Abd soft, NTND.  MSK/EXT:  Moves extremities.  Tenderness out of proportion in her neck and shoulders. SKIN: no apparent skin lesion or wound NEURO: Awake, alert and oriented appropriately.  Cranial nerve intact.  Motor 4/5 in all extremities.  Subjective paresthesia in upper extremities but not dermatomal. PSYCH: Appears anxious.  Procedures:  None  Microbiology summarized: None  Assessment and plan: Principal Problem:   Syncope Active Problems:   Central cord syndrome (HCC)   Hypertension   Type 2 diabetes mellitus with hyperlipidemia (HCC)   GAD (generalized anxiety disorder)   Obesity (BMI 30-39.9)   Tobacco abuse  Vasovagal syncope: Following severe coughing spell while smoking cigarettes.  EKG without significant finding. -Encouraged smoking cessation -Discontinue lisinopril on discharge.   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  She is followed by orthopedic surgery outpatient.  MRI cervical spine showed soft tissue injury with prevertebral effusion and posterior strain at C2-C5, mild cord edema rightward at the level of C3-4 where there is a disc protrusion flattening the ventral cord and additional disc protrusions with cord contact at C2-3 and C4-5.   -Neurosurgery recommended conservative treatment with steroid, gabapentin and therapy. -Continue Decadron  4 mg every 6 hours -Increased gabapentin to 900 mg 3 times daily on 7/80. -Continue Tylenol, oxycodone and IV Dilaudid as needed -PT/OT     Controlled NIDDM-2 with hyperglycemia: Last A1c 6.5% on 4/23.  Hyperglycemia likely due to steroid.  On metformin at home Recent Labs  Lab 01/28/23 1104 01/28/23 1604 01/28/23 2110 01/29/23 0609 01/29/23 1114  GLUCAP 232* 197* 162*  148* 267*  -Continue SSI-moderate -Increased follow-up from 4 to 6 units 3 times daily with meals -Increase Semglee from 15 units daily to twice daily -Further adjustment as appropriate -Continue statin  Essential hypertension: Normotensive -Discontinue lisinopril on discharge due to cough -Continue HCTZ  GAD (generalized anxiety disorder) -Continue with buspirone and alprazolam.    History of DDD/cervical lumbar spine surgeries: Followed outpatient by orthopedic surgery. -Pain management as above -Outpatient follow-up as above   Tobacco abuse: About 40-pack-year history.  Determined to quit. -Encourage cessation -Continue nicotine patch  GERD -PPI  Leukocytosis/bandemia: Likely demargination from steroid  Morbid obesity: Elevated BMI with comorbidity as above Body mass index is 45.43 kg/m. -Consider GLP-1 agonist -Lifestyle change to lose weight          DVT prophylaxis:  SCDs Start: 01/27/23 1515  Code Status: Full code Family Communication: None at bedside Level of care: Telemetry Medical Status is: Inpatient The patient will remain inpatient because: Central cord syndrome, pain   Final disposition: TBD Consultants:  Neurosurgery  55 minutes with more than 50% spent in reviewing records, counseling patient/family and coordinating care.   Sch Meds:  Scheduled Meds:  busPIRone  15 mg Oral Daily   dexamethasone (DECADRON) injection  4 mg Intravenous Q6H   enoxaparin (LOVENOX) injection  60 mg Subcutaneous Q24H   gabapentin  900 mg Oral TID   hydrochlorothiazide  25 mg Oral Daily   insulin aspart  0-15 Units Subcutaneous TID WC   insulin aspart  0-5 Units Subcutaneous QHS   insulin aspart  4 Units Subcutaneous TID WC   insulin glargine-yfgn  15 Units Subcutaneous Daily   olopatadine  1 drop Both Eyes BID   pantoprazole  40 mg Oral BID AC   rosuvastatin  5 mg Oral Daily   Continuous Infusions: PRN Meds:.acetaminophen **OR** acetaminophen,  ALPRAZolam, cyclobenzaprine, HYDROmorphone (DILAUDID) injection, ondansetron **OR** ondansetron (ZOFRAN) IV, oxyCODONE  Antimicrobials: Anti-infectives (From admission, onward)    None        I have personally reviewed the following labs and images: CBC: Recent Labs  Lab 01/27/23 0056 01/27/23 2120 01/28/23 0627 01/29/23 0234  WBC 12.3* 18.3* 19.0* 18.2*  NEUTROABS 7.9*  --   --   --   HGB 11.6* 12.6 12.4 11.3*  HCT 36.2 38.6 38.1 34.5*  MCV 94.0 90.6 93.8 92.2  PLT 239 319 281 283   BMP &GFR Recent Labs  Lab 01/27/23 0056 01/27/23 2120 01/28/23 0627 01/29/23 0234  NA 135  --  135 136  K 3.6  --  4.3 4.3  CL 102  --  102 103  CO2 22  --  21* 24  GLUCOSE 153*  --  168* 159*  BUN 28*  --  26* 35*  CREATININE 1.04* 1.07* 0.91 1.18*  CALCIUM 8.8*  --  9.3 9.2  MG  --   --   --  2.0  PHOS  --   --   --  3.8   Estimated Creatinine Clearance: 72 mL/min (A) (by C-G formula based on SCr of 1.18 mg/dL (H)). Liver & Pancreas: Recent  Labs  Lab 01/27/23 0056 01/29/23 0234  AST 19  --   ALT 20  --   ALKPHOS 80  --   BILITOT 0.5  --   PROT 7.6  --   ALBUMIN 3.6 3.3*   No results for input(s): "LIPASE", "AMYLASE" in the last 168 hours. No results for input(s): "AMMONIA" in the last 168 hours. Diabetic: Recent Labs    01/28/23 0640  HGBA1C 6.2*   Recent Labs  Lab 01/28/23 1104 01/28/23 1604 01/28/23 2110 01/29/23 0609 01/29/23 1114  GLUCAP 232* 197* 162* 148* 267*   Cardiac Enzymes: No results for input(s): "CKTOTAL", "CKMB", "CKMBINDEX", "TROPONINI" in the last 168 hours. No results for input(s): "PROBNP" in the last 8760 hours. Coagulation Profile: No results for input(s): "INR", "PROTIME" in the last 168 hours. Thyroid Function Tests: No results for input(s): "TSH", "T4TOTAL", "FREET4", "T3FREE", "THYROIDAB" in the last 72 hours. Lipid Profile: No results for input(s): "CHOL", "HDL", "LDLCALC", "TRIG", "CHOLHDL", "LDLDIRECT" in the last 72  hours. Anemia Panel: No results for input(s): "VITAMINB12", "FOLATE", "FERRITIN", "TIBC", "IRON", "RETICCTPCT" in the last 72 hours. Urine analysis:    Component Value Date/Time   COLORURINE YELLOW 03/11/2015 1320   APPEARANCEUR CLEAR 03/11/2015 1320   LABSPEC 1.015 03/11/2015 1320   PHURINE 7.0 03/11/2015 1320   GLUCOSEU NEGATIVE 03/11/2015 1320   HGBUR NEGATIVE 03/11/2015 1320   BILIRUBINUR NEGATIVE 03/11/2015 1320   KETONESUR NEGATIVE 03/11/2015 1320   PROTEINUR NEGATIVE 03/11/2015 1320   UROBILINOGEN 0.2 03/11/2015 1320   NITRITE NEGATIVE 03/11/2015 1320   LEUKOCYTESUR NEGATIVE 03/11/2015 1320   Sepsis Labs: Invalid input(s): "PROCALCITONIN", "LACTICIDVEN"  Microbiology: No results found for this or any previous visit (from the past 240 hour(s)).  Radiology Studies: No results found.    Martrice Apt T. Dejae Bernet [facility]  If 7PM-7AM, please contact night-coverage [email or web address] 01/29/2023, 12:42 PM

## 2023-01-29 NOTE — Progress Notes (Signed)
    Durable Medical Equipment  (From admission, onward)           Start     Ordered   01/29/23 1444  For home use only DME 4 wheeled rolling walker with seat  Once       Question:  Patient needs a walker to treat with the following condition  Answer:  Weakness   01/29/23 1443   01/29/23 1443  For home use only DME Bedside commode  Once       Question:  Patient needs a bedside commode to treat with the following condition  Answer:  Weakness   01/29/23 1443            The patient requires a bedside commode as she is not able to make it in a timely manner to the restroom on the level of the home in which she will be staying.

## 2023-01-29 NOTE — Progress Notes (Signed)
Occupational Therapy Treatment Patient Details Name: Brenda Taylor MRN: [id number] DOB: 1970-10-19 Today's Date: 01/29/2023   History of present illness Pt is a 52 y/o female presenting after a syncopal episode and fall down front porch steps. MRI+ for central cord syndrome. No sx intervention recommended. PMH: DDD, GERD, hernia, back surgery x5   OT comments  Pt making excellent progress towards OT goals though B shoulder pain remains a limitation. Trialed regular Rollator with pt able to mobilize around unit with Supervision, appropriate locking of brakes and reports less pressure on shoulders. Pt able to manage ADLs assessed fairly well without physical assistance. Discussed IADL assist at DC w/ pt verbalizing understanding. Pt completing theraputty and squeeze sponge work on OT exit. Continue to rec OP OT, as well as Rollator for use at home.   Recommendations for follow up therapy are one component of a multi-disciplinary discharge planning process, led by the attending physician.  Recommendations may be updated based on patient status, additional functional criteria and insurance authorization.    Assistance Recommended at Discharge Set up Supervision/Assistance  Patient can return home with the following  A little help with bathing/dressing/bathroom;Assistance with cooking/housework;Assist for transportation;Help with stairs or ramp for entrance   Equipment Recommendations  Other (comment) (Rollator)    Recommendations for Other Services      Precautions / Restrictions Precautions Precautions: Fall;Cervical Required Braces or Orthoses: Cervical Brace Cervical Brace: Hard collar;Other (comment) (may take off in bed and to shower as well as to go to bathroom) Restrictions Weight Bearing Restrictions: No       Mobility Bed Mobility Overal bed mobility: Needs Assistance Bed Mobility: Sidelying to Sit, Rolling Rolling: Supervision Sidelying to sit: Supervision             Transfers Overall transfer level: Needs assistance Equipment used: Rollator (4 wheels) Transfers: Sit to/from Stand Sit to Stand: Supervision           General transfer comment: able to appropriately lock brakes     Balance Overall balance assessment: Needs assistance Sitting-balance support: Feet supported, No upper extremity supported Sitting balance-Leahy Scale: Good     Standing balance support: Bilateral upper extremity supported, During functional activity Standing balance-Leahy Scale: Fair                             ADL either performed or assessed with clinical judgement   ADL Overall ADL's : Needs assistance/impaired     Grooming: Modified independent;Standing               Lower Body Dressing: Supervision/safety;Sit to/from stand               Functional mobility during ADLs: Supervision/safety;Rollator (4 wheels) General ADL Comments: Trial of rollator per PT eval w/ upright rollator recommended (though CM later noted not covered by insurance). pt able to mobilize around unit with Rollator without LOB or safety concerns. Discussed assist with IADLs initially, safety precautions to prevent fall    Extremity/Trunk Assessment Upper Extremity Assessment Upper Extremity Assessment: RUE deficits/detail;LUE deficits/detail RUE Deficits / Details: B pins and needles of B hands, 4-/5 grasp, opposition WFL though slow. only able to raise UE to approx 90* due to pain. RUE Sensation: decreased light touch   Lower Extremity Assessment Lower Extremity Assessment: Defer to PT evaluation        Vision   Vision Assessment?: No apparent visual deficits   Perception     Praxis  Cognition Arousal/Alertness: Awake/alert Behavior During Therapy: WFL for tasks assessed/performed Overall Cognitive Status: Within Functional Limits for tasks assessed                                          Exercises      Shoulder  Instructions       General Comments      Pertinent Vitals/ Pain       Pain Assessment Pain Assessment: Faces Faces Pain Scale: Hurts a little bit Pain Location: B shoulders (R > L) Pain Descriptors / Indicators: Grimacing, Guarding, Sharp Pain Intervention(s): Monitored during session  Home Living                                          Prior Functioning/Environment              Frequency  Min 2X/week        Progress Toward Goals  OT Goals(current goals can now be found in the care plan section)  Progress towards OT goals: Progressing toward goals  Acute Rehab OT Goals Patient Stated Goal: home soon OT Goal Formulation: With patient Time For Goal Achievement: 02/11/23 Potential to Achieve Goals: Good ADL Goals Pt Will Perform Grooming: with modified independence;standing Pt Will Transfer to Toilet: with modified independence;ambulating Pt Will Perform Tub/Shower Transfer: Tub transfer;with modified independence;ambulating Pt/caregiver will Perform Home Exercise Program: Increased strength;Both right and left upper extremity;With theraputty;With written HEP provided  Plan Discharge plan remains appropriate    Co-evaluation                 AM-PAC OT "6 Clicks" Daily Activity     Outcome Measure   Help from another person eating meals?: None Help from another person taking care of personal grooming?: None Help from another person toileting, which includes using toliet, bedpan, or urinal?: A Little Help from another person bathing (including washing, rinsing, drying)?: A Little Help from another person to put on and taking off regular upper body clothing?: A Little Help from another person to put on and taking off regular lower body clothing?: A Little 6 Click Score: 20    End of Session Equipment Utilized During Treatment: Rollator (4 wheels)  OT Visit Diagnosis: Unsteadiness on feet (R26.81);Other abnormalities of gait and  mobility (R26.89);Muscle weakness (generalized) (M62.81)   Activity Tolerance Patient tolerated treatment well   Patient Left in chair;with call bell/phone within reach;with chair alarm set;Other (comment) (PT entering)   Nurse Communication Mobility status (RN observed pt ambulating in hallway)        Time: 1610-9604 OT Time Calculation (min): 26 min  Charges: OT General Charges $OT Visit: 1 Visit OT Treatments $Self Care/Home Management : 8-22 mins $Therapeutic Activity: 8-22 mins  Bradd Canary, OTR/L Acute Rehab Services Office: [phone]   Lorre Munroe 01/29/2023, 2:23 PM

## 2023-01-30 DIAGNOSIS — R55 Syncope and collapse: Secondary | ICD-10-CM | POA: Diagnosis not present

## 2023-01-30 LAB — GLUCOSE, CAPILLARY
Glucose-Capillary: 139 mg/dL — ABNORMAL HIGH (ref 70–99)
Glucose-Capillary: 135 mg/dL — ABNORMAL HIGH (ref 70–99)
Glucose-Capillary: 213 mg/dL — ABNORMAL HIGH (ref 70–99)
Glucose-Capillary: 140 mg/dL — ABNORMAL HIGH (ref 70–99)

## 2023-01-30 LAB — CBC
HCT: 35.8 % — ABNORMAL LOW (ref 36.0–46.0)
Hemoglobin: 11.8 g/dL — ABNORMAL LOW (ref 12.0–15.0)
MCH: 30.6 pg (ref 26.0–34.0)
MCHC: 33 g/dL (ref 30.0–36.0)
MCV: 93 fL (ref 80.0–100.0)
Platelets: 279 10*3/uL (ref 150–400)
RBC: 3.85 MIL/uL — ABNORMAL LOW (ref 3.87–5.11)
RDW: 13.8 % (ref 11.5–15.5)
WBC: 17.1 10*3/uL — ABNORMAL HIGH (ref 4.0–10.5)
nRBC: 0 % (ref 0.0–0.2)

## 2023-01-30 LAB — RENAL FUNCTION PANEL
Albumin: 3.2 g/dL — ABNORMAL LOW (ref 3.5–5.0)
Anion gap: 9 (ref 5–15)
BUN: 36 mg/dL — ABNORMAL HIGH (ref 6–20)
CO2: 25 mmol/L (ref 22–32)
Calcium: 9.1 mg/dL (ref 8.9–10.3)
Chloride: 103 mmol/L (ref 98–111)
Creatinine, Ser: 0.98 mg/dL (ref 0.44–1.00)
GFR, Estimated: >60 mL/min (ref >60)
Glucose, Bld: 137 mg/dL — ABNORMAL HIGH (ref 70–99)
Phosphorus: 3.5 mg/dL (ref 2.5–4.6)
Potassium: 4.3 mmol/L (ref 3.5–5.1)
Sodium: 137 mmol/L (ref 135–145)

## 2023-01-30 LAB — MAGNESIUM: Magnesium: 2 mg/dL (ref 1.7–2.4)

## 2023-01-30 MED ORDER — BENZONATATE 100 MG PO CAPS
100.0000 mg | ORAL_CAPSULE | Freq: Three times a day (TID) | ORAL | Status: DC | PRN
  Administered 2023-01-30 – 2023-01-31 (×2): 100 mg via ORAL
  Filled 2023-01-30 (×2): qty 1

## 2023-01-30 NOTE — Progress Notes (Signed)
Physical Therapy Treatment Patient Details Name: Brenda Taylor MRN: [id number] DOB: [birth year] Today's Date: 01/30/2023   History of Present Illness Pt is a 52 y/o female presenting after a syncopal episode and fall down front porch steps. MRI+ for central cord syndrome. No sx intervention recommended. PMH: DDD, GERD, hernia, back surgery x5    PT Comments  Pt was seen for mobility on bed, had just walked to get her bath and was on bed.  Reviewed HEP including strengthening and ROM to ankles, knees and hips.  Pt is feeling weaker on L side but was pre-existing to this injury.  Follow along with her to walk and move as needed, has practiced steps for home return upon completion of hosp stay.  Follow goals of acute PT as outlined on POC.     Assistance Recommended at Discharge Intermittent Supervision/Assistance  If plan is discharge home, recommend the following:  Can travel by private vehicle    A little help with walking and/or transfers;A little help with bathing/dressing/bathroom;Assistance with cooking/housework;Assist for transportation;Help with stairs or ramp for entrance      Equipment Recommendations  Rollator (4 wheels)    Recommendations for Other Services       Precautions / Restrictions Precautions Precautions: Fall;Cervical Required Braces or Orthoses: Cervical Brace Cervical Brace: Hard collar;Other (comment) Restrictions Weight Bearing Restrictions: No     Mobility  Bed Mobility Overal bed mobility: Needs Assistance             General bed mobility comments: remained in bed    Transfers                        Ambulation/Gait                   Stairs             Wheelchair Mobility     Tilt Bed    Modified Rankin (Stroke Patients Only)       Balance                                            Cognition Arousal/Alertness: Awake/alert Behavior During Therapy: WFL for tasks  assessed/performed Overall Cognitive Status: Within Functional Limits for tasks assessed                                          Exercises General Exercises - Lower Extremity Ankle Circles/Pumps: AROM, 10 reps Quad Sets: AROM, 10 reps Gluteal Sets: AROM, 10 reps Heel Slides: AROM, 10 reps Hip ABduction/ADduction: AROM, 10 reps Hip Flexion/Marching: AROM, 5 reps    General Comments General comments (skin integrity, edema, etc.): pt reviewed her exercises for home, expecting to leave tomorrow.      Pertinent Vitals/Pain Pain Assessment Pain Assessment: Faces Faces Pain Scale: Hurts a little bit Pain Location: shoulders, neck irritated from brace Pain Descriptors / Indicators: Guarding    Home Living                          Prior Function            PT Goals (current goals can now be found in the care plan section) Acute Rehab PT Goals Patient Stated  Goal: return home Progress towards PT goals: Progressing toward goals    Frequency    Min 5X/week      PT Plan Current plan remains appropriate    Co-evaluation              AM-PAC PT "6 Clicks" Mobility   Outcome Measure  Help needed turning from your back to your side while in a flat bed without using bedrails?: A Little Help needed moving from lying on your back to sitting on the side of a flat bed without using bedrails?: A Little Help needed moving to and from a bed to a chair (including a wheelchair)?: A Little Help needed standing up from a chair using your arms (e.g., wheelchair or bedside chair)?: A Little Help needed to walk in hospital room?: A Little Help needed climbing 3-5 steps with a railing? : A Little 6 Click Score: 18    End of Session   Activity Tolerance: Patient tolerated treatment well Patient left: in bed;with call bell/phone within reach;with bed alarm set Nurse Communication: Mobility status PT Visit Diagnosis: Unsteadiness on feet (R26.81);Muscle  weakness (generalized) (M62.81);Difficulty in walking, not elsewhere classified (R26.2);Pain Pain - Right/Left: Right (bilateral) Pain - part of body: Shoulder     Time: 1610-9604 PT Time Calculation (min) (ACUTE ONLY): 17 min  Charges:    $Therapeutic Exercise: 8-22 mins PT General Charges $$ ACUTE PT VISIT: 1 Visit           Ivar Drape 01/30/2023, 5:28 PM  Samul Dada, PT PhD Acute Rehab Dept. Number: [facility] [id number] and [facility] [phone]

## 2023-01-30 NOTE — Progress Notes (Signed)
PROGRESS NOTE Brenda Taylor  QMV:[id number] DOB: 1971-05-18 DOA: 01/26/2023 PCP: Anabel Halon, MD  Brief Narrative/Hospital Course: 15 YOF W/ DM-2, HTN, tobacco use disorder, GERD, morbid obesity and DDD with prior cervical and lumbar spine surgeries brought to ED by EMS after syncopal episode following severe cough while sitting on the porch and smoking cigarettes. She fell backward with LO>when she woke up, not able to move her arms and legs and EMS was called and transferred from [facility] to [facility] for MRI.  In ED, she was in severe pain and bilateral shoulder and weakness in all extremities.  Vital stable.  BMP and CBC without significant finding other than mild leukocytosis.  EKG showed sinus tachycardia.  CT head, CT cervical spine, CXR, bilateral shoulder x-ray without acute finding.  MRI cervical spine showed soft tissue injury with prevertebral effusion and posterior strain at C2-C5, mild cord edema rightward at the level of C3-4 where there is a disc protrusion flattening the ventral cord and additional disc protrusions with cord contact at C2-3 and C4-5. Neurosurgery consulted and recommended Decadron, neck collar and outpatient follow-up. PT OT is following closely, on pain management with IV Dilaudid, oral Oxy, Flexeril     Subjective: Patient seen and examined this morning alert awake overall feeling better, left neck pain better but still having pain in right neck and shoulder   Assessment and Plan: Principal Problem:   Syncope Active Problems:   Central cord syndrome (HCC)   Hypertension   Type 2 diabetes mellitus with hyperlipidemia (HCC)   GAD (generalized anxiety disorder)   Obesity (BMI 30-39.9)   Tobacco abuse   Vasovagal syncope: Episode of syncope following severe coughing spell while smoking cigarette EKG no acute finding hemodynamically stable lisinopril discontinued,   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  MRI cervical spine showed soft tissue injury with prevertebral effusion and posterior strain at C2-C5, mild cord edema rightward at the level of C3-4 where there is a disc protrusion flattening the ventral cord and additional disc protrusions with cord contact at C2-3 and C4-5.  Neurosurgery was consulted and recommended conservative management with steroid and gabapentin PT OT.  Continue on Decadron 4 mg every 6 hours gabapentin increased to 900 mg 3 times daily, continue Tylenol Oxley Dilaudid for pain management and encourage ambulation w/ PTOT   Controlled NIDDM-2 with hyperglycemia: Current HbA1c 6.2 blood sugar fairly controlled holding OHA continue on NovoLog 6 units 3 times daily, Semglee 15 units twice daily and SSI 0 to 15 units  Recent Labs  Lab 01/28/23 0640 01/28/23 1104 01/29/23 1114 01/29/23 1606 01/29/23 2111 01/30/23 0628 01/30/23 1334  GLUCAP  --    < > 267* 202* 214* 139* 135*  HGBA1C 6.2*  --   --   --   --   --   --    < > = values in this interval not displayed.     Essential hypertension: BP stable ON hydrochlorothiazide.lisinopril discontinued due to cough  GAD: Mood is stable on buspirone and alprazolam.    History of DDD/cervical lumbar spine surgeries: Followed outpatient by orthopedic surgery ( Dr Elyse Hsu management as above WILL NEED Outpatient follow-up as above   Tobacco abuse:  about 40-pack-year history.  Determined to quit. Encourage cessation Continue nicotine patch   GERD Cont PPI   Leukocytosis/bandemia: Likely demargination from steroid.  Currently  afebrile.  Monitor    Morbid Obesity:Patient's Body mass index is 45.43 kg/m. : Will benefit with PCP follow-up, weight loss  healthy lifestyle and outpatient sleep evaluation.   DVT prophylaxis: SCDs Start: 01/27/23 1515 Code Status:   Code Status: Full Code Family Communication: plan of care  discussed with patient at bedside. Patient status is: Inpatient because of ongoing pain Level of care: Med-Surg   Dispo: The patient is from: home             Anticipated disposition: home tomorrow if pain stable Objective: Vitals last 24 hrs: Vitals:   01/30/23 0008 01/30/23 0406 01/30/23 0835 01/30/23 1317  BP: 112/64 125/81 (!) 141/75 111/76  Pulse: 63 (!) 59 62 (!) 56  Resp: (!) 21 20 19 18   Temp: 97.9 F (36.6 C) 98 F (36.7 C) 97.9 F (36.6 C) 98.2 F (36.8 C)  TempSrc: Oral  Oral Oral  SpO2: 93% 92% 92% 94%  Weight:      Height:       Weight change:   Physical Examination: General exam: alert awake, older than stated age HEENT:Oral mucosa moist, Ear/Nose WNL grossly Respiratory system: bilaterally clear BS, no use of accessory muscle Cardiovascular system: S1 & S2 +, No JVD. Gastrointestinal system: Abdomen soft,NT,ND, BS+ Nervous System:Alert, awake, moving extremities. Extremities: LE edema neg,distal peripheral pulses palpable.  Skin: No rashes,no icterus. MSK: Normal muscle bulk,tone, power  Medications reviewed:  Scheduled Meds:  busPIRone  15 mg Oral Daily   dexamethasone (DECADRON) injection  4 mg Intravenous Q6H   enoxaparin (LOVENOX) injection  60 mg Subcutaneous Q24H   gabapentin  900 mg Oral TID   hydrochlorothiazide  25 mg Oral Daily   insulin aspart  0-15 Units Subcutaneous TID WC   insulin aspart  0-5 Units Subcutaneous QHS   insulin aspart  6 Units Subcutaneous TID WC   insulin glargine-yfgn  15 Units Subcutaneous BID   olopatadine  1 drop Both Eyes BID   pantoprazole  40 mg Oral BID AC   rosuvastatin  5 mg Oral Daily   Continuous Infusions:    Diet Order             Diet regular Room service appropriate? Yes; Fluid consistency: Thin  Diet effective now                  Intake/Output Summary (Last 24 hours) at 01/30/2023 1343 Last data filed at 01/30/2023 0600 Gross per 24 hour  Intake 200 ml  Output --  Net 200 ml   Net IO  Since Admission: 500 mL [01/30/23 1343]  Wt Readings from Last 3 Encounters:  01/27/23 123.8 kg  01/02/23 124.3 kg  01/01/23 124.6 kg     Unresulted Labs (From admission, onward)     Start     Ordered   02/03/23 0500  Creatinine, serum  (enoxaparin (LOVENOX)    CrCl >/= 30 ml/min)  Weekly,   R     Comments: while on enoxaparin therapy    01/27/23 1514          Data Reviewed: I have personally reviewed following labs and imaging studies CBC: Recent Labs  Lab 01/27/23 0056 01/27/23 2120 01/28/23 0627 01/29/23 0234 01/30/23 0232  WBC 12.3* 18.3* 19.0* 18.2* 17.1*  NEUTROABS 7.9*  --   --   --   --   HGB 11.6* 12.6 12.4 11.3* 11.8*  HCT 36.2 38.6 38.1 34.5* 35.8*  MCV 94.0 90.6 93.8 92.2 93.0  PLT  239 319 281 283 279   Basic Metabolic Panel: Recent Labs  Lab 01/27/23 0056 01/27/23 2120 01/28/23 0627 01/29/23 0234 01/30/23 0232  NA 135  --  135 136 137  K 3.6  --  4.3 4.3 4.3  CL 102  --  102 103 103  CO2 22  --  21* 24 25  GLUCOSE 153*  --  168* 159* 137*  BUN 28*  --  26* 35* 36*  CREATININE 1.04* 1.07* 0.91 1.18* 0.98  CALCIUM 8.8*  --  9.3 9.2 9.1  MG  --   --   --  2.0 2.0  PHOS  --   --   --  3.8 3.5   GFR: Estimated Creatinine Clearance: 86.7 mL/min (by C-G formula based on SCr of 0.98 mg/dL). Liver Function Tests: Recent Labs  Lab 01/27/23 0056 01/29/23 0234 01/30/23 0232  AST 19  --   --   ALT 20  --   --   ALKPHOS 80  --   --   BILITOT 0.5  --   --   PROT 7.6  --   --   ALBUMIN 3.6 3.3* 3.2*   Recent Labs    01/28/23 0640  HGBA1C 6.2*   CBG: Recent Labs  Lab 01/29/23 1114 01/29/23 1606 01/29/23 2111 01/30/23 0628 01/30/23 1334  GLUCAP 267* 202* 214* 139* 135*    No results found for this or any previous visit (from the past 240 hour(s)).  Antimicrobials: Anti-infectives (From admission, onward)    None      Culture/Microbiology No results found for: "SDES", "SPECREQUEST", "CULT", "REPTSTATUS"    Radiology  Studies: No results found.   LOS: 2 days   Lanae Boast, MD [facility]  01/30/2023, 1:43 PM

## 2023-01-30 NOTE — Plan of Care (Signed)
  Problem: Education: Goal: Knowledge of General Education information will improve Description Including pain rating scale, medication(s)/side effects and non-pharmacologic comfort measures Outcome: Progressing   Problem: Clinical Measurements: Goal: Will remain free from infection Outcome: Progressing   Problem: Activity: Goal: Risk for activity intolerance will decrease Outcome: Progressing   Problem: Coping: Goal: Level of anxiety will decrease Outcome: Progressing   Problem: Pain Managment: Goal: General experience of comfort will improve Outcome: Progressing   Problem: Safety: Goal: Ability to remain free from injury will improve Outcome: Progressing   

## 2023-01-30 NOTE — Hospital Course (Signed)
52 YOF W/ DM-2, HTN, tobacco use disorder, GERD, morbid obesity and DDD with prior cervical and lumbar spine surgeries brought to ED by EMS after syncopal episode following severe cough while sitting on the porch and smoking cigarettes. She fell backward with LO>when she woke up, not able to move her arms and legs and EMS was called and transferred from [facility] to [facility] for MRI.  In ED, she was in severe pain and bilateral shoulder and weakness in all extremities.  Vital stable.  BMP and CBC without significant finding other than mild leukocytosis.  EKG showed sinus tachycardia.  CT head, CT cervical spine, CXR, bilateral shoulder x-ray without acute finding.  MRI cervical spine showed soft tissue injury with prevertebral effusion and posterior strain at C2-C5, mild cord edema rightward at the level of C3-4 where there is a disc protrusion flattening the ventral cord and additional disc protrusions with cord contact at C2-3 and C4-5. Neurosurgery consulted and recommended Decadron, neck collar and outpatient follow-up. PT OT is following closely, on pain management with IV Dilaudid, oral Oxy, Flexeril At this time patient feels overall improved and feels ready for home

## 2023-01-31 ENCOUNTER — Inpatient Hospital Stay (HOSPITAL_COMMUNITY): Payer: Medicaid Other

## 2023-01-31 DIAGNOSIS — R55 Syncope and collapse: Secondary | ICD-10-CM

## 2023-01-31 LAB — CBC
HCT: 38.5 % (ref 36.0–46.0)
Hemoglobin: 12.9 g/dL (ref 12.0–15.0)
MCH: 30.6 pg (ref 26.0–34.0)
MCHC: 33.5 g/dL (ref 30.0–36.0)
MCV: 91.4 fL (ref 80.0–100.0)
Platelets: 274 10*3/uL (ref 150–400)
RBC: 4.21 MIL/uL (ref 3.87–5.11)
RDW: 13.6 % (ref 11.5–15.5)
WBC: 17.3 10*3/uL — ABNORMAL HIGH (ref 4.0–10.5)
nRBC: 0 % (ref 0.0–0.2)

## 2023-01-31 LAB — ECHOCARDIOGRAM COMPLETE
AR max vel: 2 cm2
AV Area VTI: 1.86 cm2
AV Area mean vel: 1.95 cm2
AV Mean grad: 7 mmHg
AV Peak grad: 12.8 mmHg
Ao pk vel: 1.79 m/s
Area-P 1/2: 3.37 cm2
Height: 65 in
S' Lateral: 3.45 cm
Weight: 4368 oz

## 2023-01-31 LAB — BASIC METABOLIC PANEL
Anion gap: 8 (ref 5–15)
BUN: 35 mg/dL — ABNORMAL HIGH (ref 6–20)
CO2: 24 mmol/L (ref 22–32)
Calcium: 9 mg/dL (ref 8.9–10.3)
Chloride: 102 mmol/L (ref 98–111)
Creatinine, Ser: 0.99 mg/dL (ref 0.44–1.00)
GFR, Estimated: >60 mL/min (ref >60)
Glucose, Bld: 146 mg/dL — ABNORMAL HIGH (ref 70–99)
Potassium: 4.1 mmol/L (ref 3.5–5.1)
Sodium: 134 mmol/L — ABNORMAL LOW (ref 135–145)

## 2023-01-31 LAB — GLUCOSE, CAPILLARY
Glucose-Capillary: 146 mg/dL — ABNORMAL HIGH (ref 70–99)
Glucose-Capillary: 196 mg/dL — ABNORMAL HIGH (ref 70–99)

## 2023-01-31 MED ORDER — OXYCODONE HCL 5 MG PO TABS: 5.0000 mg | ORAL_TABLET | Freq: Four times a day (QID) | ORAL | 0 refills | Status: DC | PRN

## 2023-01-31 MED ORDER — METHYLPREDNISOLONE 4 MG PO TBPK: ORAL_TABLET | ORAL | 0 refills | Status: DC

## 2023-01-31 MED ORDER — GABAPENTIN 300 MG PO CAPS: 600.0000 mg | ORAL_CAPSULE | Freq: Three times a day (TID) | ORAL | 0 refills | Status: DC

## 2023-01-31 NOTE — Progress Notes (Signed)
Physical Therapy Treatment Patient Details Name: Brenda Taylor MRN: [id number] DOB: 1971-06-12 Today's Date: 01/31/2023   History of Present Illness Pt is a 52 y/o female presenting after a syncopal episode and fall down front porch steps. MRI+ for central cord syndrome. No sx intervention recommended. PMH: DDD, GERD, hernia, back surgery x5    PT Comments  Pt received in bed, feels like her back is sore from the bed and hoping to go home soon. Pt moving independently in bed. Min A to don neck brace. Pt ambulated 500' with standup rollator and supervision. Discussed issues with LLE, passive hamstring stretch done in bed as well as quad exercises and review of L hip abduction exercises that she had been doing before this injury. Continue to recommend OP PT. PT will continue to follow.      Assistance Recommended at Discharge Intermittent Supervision/Assistance  If plan is discharge home, recommend the following:  Can travel by private vehicle    A little help with walking and/or transfers;A little help with bathing/dressing/bathroom;Assistance with cooking/housework;Assist for transportation;Help with stairs or ramp for entrance      Equipment Recommendations  Rollator (4 wheels)    Recommendations for Other Services       Precautions / Restrictions Precautions Precautions: Fall;Cervical Precaution Comments: reviewed precautions, pt recalled no lifting Required Braces or Orthoses: Cervical Brace Cervical Brace: Hard collar (may take off in bed, to shower, and to go to bathroom) Restrictions Weight Bearing Restrictions: No     Mobility  Bed Mobility Overal bed mobility: Needs Assistance Bed Mobility: Sidelying to Sit, Rolling, Sit to Supine Rolling: Supervision Sidelying to sit: Supervision   Sit to supine: Supervision   General bed mobility comments: able to get in and out of bed without physical assist    Transfers Overall transfer level: Needs assistance Equipment  used: Rollator (4 wheels) Transfers: Sit to/from Stand Sit to Stand: Supervision                Ambulation/Gait Ambulation/Gait assistance: Supervision Gait Distance (Feet): 500 Feet Assistive device: Rollator (4 wheels) (standup rollator) Gait Pattern/deviations: Step-through pattern, Wide base of support, Decreased stride length Gait velocity: decreased Gait velocity interpretation: 1.31 - 2.62 ft/sec, indicative of limited community ambulator   General Gait Details: ambulating at supervision level   Stairs             Wheelchair Mobility     Tilt Bed    Modified Rankin (Stroke Patients Only)       Balance Overall balance assessment: Needs assistance Sitting-balance support: Feet supported, No upper extremity supported Sitting balance-Leahy Scale: Good     Standing balance support: Bilateral upper extremity supported, During functional activity Standing balance-Leahy Scale: Fair Standing balance comment: able to maintain static stance without support                            Cognition Arousal/Alertness: Awake/alert Behavior During Therapy: WFL for tasks assessed/performed Overall Cognitive Status: Within Functional Limits for tasks assessed                                          Exercises General Exercises - Lower Extremity Long Arc Quad: AROM, Left, 5 reps, Seated (with 10 sec extension hold) Other Exercises Other Exercises: passive L hamstring stretch x30 secs    General Comments General comments (  skin integrity, edema, etc.): reviewed SL L hip abduction exercises to resume when she can tolerate SL      Pertinent Vitals/Pain Pain Assessment Pain Assessment: Faces Faces Pain Scale: Hurts little more Pain Location: shoulders, neck Pain Descriptors / Indicators: Grimacing, Aching Pain Intervention(s): Limited activity within patient's tolerance, Monitored during session    Home Living                           Prior Function            PT Goals (current goals can now be found in the care plan section) Acute Rehab PT Goals Patient Stated Goal: return home PT Goal Formulation: With patient Time For Goal Achievement: 02/11/23 Potential to Achieve Goals: Good Progress towards PT goals: Progressing toward goals    Frequency    Min 5X/week      PT Plan Current plan remains appropriate    Co-evaluation              AM-PAC PT "6 Clicks" Mobility   Outcome Measure  Help needed turning from your back to your side while in a flat bed without using bedrails?: A Little Help needed moving from lying on your back to sitting on the side of a flat bed without using bedrails?: A Little Help needed moving to and from a bed to a chair (including a wheelchair)?: A Little Help needed standing up from a chair using your arms (e.g., wheelchair or bedside chair)?: A Little Help needed to walk in hospital room?: A Little Help needed climbing 3-5 steps with a railing? : A Little 6 Click Score: 18    End of Session Equipment Utilized During Treatment: Gait belt;Cervical collar Activity Tolerance: Patient tolerated treatment well Patient left: in bed;with call bell/phone within reach;with bed alarm set Nurse Communication: Mobility status PT Visit Diagnosis: Unsteadiness on feet (R26.81);Muscle weakness (generalized) (M62.81);Difficulty in walking, not elsewhere classified (R26.2);Pain Pain - Right/Left: Right Pain - part of body: Shoulder (neck)     Time: 4098-1191 PT Time Calculation (min) (ACUTE ONLY): 22 min  Charges:    $Gait Training: 8-22 mins PT General Charges $$ ACUTE PT VISIT: 1 Visit                     Lyanne Co, PT  Acute Rehab Services Secure chat preferred Office [phone]    Brenda Taylor 01/31/2023, 11:43 AM

## 2023-01-31 NOTE — Progress Notes (Signed)
AVS instructions were provided/reviewed with patient. All personal belongings were returned.    DME at bedside.

## 2023-01-31 NOTE — Discharge Summary (Signed)
Physician Discharge Summary  Brenda Taylor:[id number] DOB: Jul 21, 1971 DOA: 01/26/2023  PCP: Anabel Halon, MD  Admit date: 01/26/2023 Discharge date: 01/31/2023 Recommendations for Outpatient Follow-up:  Follow up with PCP in 1 weeks-call for appointment Please obtain BMP/CBC in one week Follow-up with outpatient neurosurgery  Discharge Dispo: HOME Discharge Condition: Stable Code Status:   Code Status: Full Code Diet recommendation:  Diet Order             Diet Carb Modified           Diet regular Room service appropriate? Yes; Fluid consistency: Thin  Diet effective now                    Brief/Interim Summary: 52 YOF W/ DM-2, HTN, tobacco use disorder, GERD, morbid obesity and DDD with prior cervical and lumbar spine surgeries brought to ED by EMS after syncopal episode following severe cough while sitting on the porch and smoking cigarettes. She fell backward with LO>when she woke up, not able to move her arms and legs and EMS was called and transferred from [facility] to [facility] for MRI.  In ED, she was in severe pain and bilateral shoulder and weakness in all extremities.  Vital stable.  BMP and CBC without significant finding other than mild leukocytosis.  EKG showed sinus tachycardia.  CT head, CT cervical spine, CXR, bilateral shoulder x-ray without acute finding.  MRI cervical spine showed soft tissue injury with prevertebral effusion and posterior strain at C2-C5, mild cord edema rightward at the level of C3-4 where there is a disc protrusion flattening the ventral cord and additional disc protrusions with cord contact at C2-3 and C4-5. Neurosurgery consulted and recommended Decadron, neck collar and outpatient follow-up. PT OT is following closely, on pain management with IV Dilaudid, oral Oxy, Flexeril At this time patient feels overall improved and feels ready for home  Echo was normal w/ lvef 60-65%.  Discharge Diagnoses:  Principal Problem:   Syncope Active Problems:    Central cord syndrome (HCC)   Hypertension   Type 2 diabetes mellitus with hyperlipidemia (HCC)   GAD (generalized anxiety disorder)   Obesity (BMI 30-39.9)   Tobacco abuse  Vasovagal syncope: Episode of syncope following severe coughing spell while smoking cigarette EKG no acute finding hemodynamically stable lisinopril discontinued.  Echo obtained for completeness if negative patient will be discharged.   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  MRI cervical spine showed soft tissue injury with prevertebral effusion and posterior strain at C2-C5, mild cord edema rightward at the level of C3-4 where there is a disc protrusion flattening the ventral cord and additional disc protrusions with cord contact at C2-3 and C4-5.  Neurosurgery was consulted and recommended conservative management with steroid and gabapentin PT OT.  Continue on Decadron 4 mg every 6 hours gabapentin increased to 900 mg 3 times daily, continue Tylenol Oxley Dilaudid for pain management and encourage ambulation w/ PTOT   Controlled NIDDM-2 with hyperglycemia: Current HbA1c 6.2 blood sugar fairly controlled holding OHA continue on NovoLog 6 units 3 times daily, Semglee 15 units twice daily and SSI 0 to 15 units  Recent Labs  Lab 01/28/23 0640 01/28/23 1104 01/30/23 1334 01/30/23 1616 01/30/23 2138 01/31/23 0648 01/31/23 1104  GLUCAP  --    < > 135* 213* 140* 146* 196*  HGBA1C 6.2*  --   --   --   --   --   --    < > =  values in this interval not displayed.     Essential hypertension: BP stable ON hydrochlorothiazide.lisinopril discontinued due to cough  GAD: Mood is stable on buspirone and alprazolam.    History of DDD/cervical lumbar spine surgeries: Followed outpatient by orthopedic surgery ( Dr Elyse Hsu management as above WILL NEED Outpatient follow-up  as above   Tobacco abuse:  about 40-pack-year history.  Determined to quit. Encourage cessation Continue nicotine patch   GERD Cont PPI   Leukocytosis/bandemia: Likely demargination from steroid.  Currently afebrile.  Monitor  Morbid Obesity:Patient's Body mass index is 45.43 kg/m. : Will benefit with PCP follow-up, weight loss  healthy lifestyle and outpatient sleep evaluation.   Consults: NEUROSURGERY Subjective: Alert awake oriented able to feed much better today left shoulder pain is improved some right shoulder neck pain  Discharge Exam: Vitals:   01/31/23 0747 01/31/23 1102  BP: 122/70 108/73  Pulse: (!) 55 (!) 58  Resp: 16 16  Temp: 97.7 F (36.5 C) 98.3 F (36.8 C)  SpO2: 91% 94%   General: Pt is alert, awake, not in acute distress Cardiovascular: RRR, S1/S2 +, no rubs, no gallops Respiratory: CTA bilaterally, no wheezing, no rhonchi Abdominal: Soft, NT, ND, bowel sounds + Extremities: no edema, no cyanosis  Discharge Instructions  Discharge Instructions     Ambulatory referral to Occupational Therapy   Complete by: As directed    Ambulatory referral to Physical Therapy   Complete by: As directed    Diet Carb Modified   Complete by: As directed    Discharge instructions   Complete by: As directed    Please call call MD or return to ER for similar or worsening recurring problem that brought you to hospital or if any fever,nausea/vomiting,abdominal pain, uncontrolled pain, chest pain,  shortness of breath or any other alarming symptoms.  Please follow-up with the neurosurgery team  Please follow-up your doctor as instructed in a week time and call the office for appointment.  Please avoid alcohol, smoking, or any other illicit substance and maintain healthy habits including taking your regular medications as prescribed.  You were cared for by a hospitalist during your hospital stay. If you have any questions about your discharge medications or the care  you received while you were in the hospital after you are discharged, you can call the unit and ask to speak with the hospitalist on call if the hospitalist that took care of you is not available.  Once you are discharged, your primary care physician will handle any further medical issues. Please note that NO REFILLS for any discharge medications will be authorized once you are discharged, as it is imperative that you return to your primary care physician (or establish a relationship with a primary care physician if you do not have one) for your aftercare needs so that they can reassess your need for medications and monitor your lab values  Check blood sugar 3 times a day and bedtime at home. If blood sugar running above 200 less than 70 please call your MD to adjust insulin. If blood sugars running less 100 do not use insulin and call MD. If you noticed signs and symptoms of hypoglycemia or low blood sugar like jitteriness, confusion, thirst, tremor, sweating- Check blood sugar, drink sugary drink/biscuits/sweets to increase sugar level and call MD or return to ER   Increase activity slowly   Complete by: As directed    No wound care   Complete by: As directed  Allergies as of 01/31/2023       Reactions   Amoxicillin Hives   Aspirin Other (See Comments)   Cannot take due to ulcers   Nsaids Other (See Comments)   Cannot take due to ulcers         Medication List     STOP taking these medications    lisinopril 40 MG tablet Commonly known as: ZESTRIL   predniSONE 20 MG tablet Commonly known as: DELTASONE       TAKE these medications    ALPRAZolam 0.5 MG tablet Commonly known as: XANAX TAKE ONE TABLET BY MOUTH AT BEDTIME AS NEEDED FOR ANXIETY. What changed: See the new instructions.   benzonatate 200 MG capsule Commonly known as: TESSALON TAKE 1 CAPSULE BY MOUTH 2 TIMES DAILY AS NEEDED FOR COUGH. What changed: See the new instructions.   busPIRone 15 MG  tablet Commonly known as: BUSPAR Take 1 tablet (15 mg total) by mouth daily.   cyclobenzaprine 10 MG tablet Commonly known as: FLEXERIL Take 10 mg by mouth as needed for muscle spasms.   gabapentin 300 MG capsule Commonly known as: NEURONTIN Take 2 capsules (600 mg total) by mouth 3 (three) times daily. What changed:  medication strength how much to take   hydrochlorothiazide 25 MG tablet Commonly known as: HYDRODIURIL Take 1 tablet (25 mg total) by mouth daily.   metFORMIN 500 MG 24 hr tablet Commonly known as: GLUCOPHAGE-XR Take 1 tablet (500 mg total) by mouth daily with breakfast.   methylPREDNISolone 4 MG Tbpk tablet Commonly known as: MEDROL DOSEPAK Take the packet as instructed   naproxen sodium 220 MG tablet Commonly known as: ALEVE Take 440 mg by mouth daily as needed (headache).   olopatadine 0.1 % ophthalmic solution Commonly known as: Pataday Place 1 drop into both eyes 2 (two) times daily.   oxyCODONE 5 MG immediate release tablet Commonly known as: Oxy IR/ROXICODONE Take 1 tablet (5 mg total) by mouth every 6 (six) hours as needed for up to 12 doses for moderate pain.   pantoprazole 40 MG tablet Commonly known as: Protonix Take 1 tablet (40 mg total) by mouth 2 (two) times daily before a meal. What changed: when to take this   rosuvastatin 5 MG tablet Commonly known as: Crestor Take 1 tablet (5 mg total) by mouth daily.               Durable Medical Equipment  (From admission, onward)           Start     Ordered   01/31/23 1236  For home use only DME Shower stool  Once        01/31/23 1235   01/29/23 1458  For home use only DME 4 wheeled rolling walker with seat  Once       Question:  Patient needs a walker to treat with the following condition  Answer:  Weakness   01/29/23 1457   01/29/23 1458  For home use only DME Bedside commode  Once       Question:  Patient needs a bedside commode to treat with the following condition  Answer:   Weakness   01/29/23 1457            Follow-up Information     Dawley, Troy C, DO Follow up in 2 week(s).   Contact information: [address] [place] [address] [place] [address] [phone]         [facility]  [place] Follow up.   Specialty: Rehabilitation Why: The outpatient therapy will contact you for the first appointment Contact information: [id number] [place] [address] [phone]               Allergies  Allergen Reactions   Amoxicillin Hives   Aspirin Other (See Comments)    Cannot take due to ulcers   Nsaids Other (See Comments)    Cannot take due to ulcers     The results of significant diagnostics from this hospitalization (including imaging, microbiology, ancillary and laboratory) are listed below for reference.    Microbiology: No results found for this or any previous visit (from the past 240 hour(s)).  Procedures/Studies: ECHOCARDIOGRAM COMPLETE  Result Date: 01/31/2023    ECHOCARDIOGRAM REPORT   Patient Name:   Brenda Taylor Date of Exam: 01/31/2023 Medical Rec #:  [id number]     Height:       65.0 in Accession #:    [id number]    Weight:       273.0 lb Date of Birth:  Nov 26, 1970      BSA:          2.258 m Patient Age:    52 years      BP:           108/73 mmHg Patient Gender: F             HR:           60 bpm. Exam Location:  Inpatient Procedure: 2D Echo, Cardiac Doppler and Color Doppler Indications:    Syncope R55  History:        Patient has prior history of Echocardiogram examinations, most                 recent 08/13/2019. Signs/Symptoms:Syncope; Risk                 Factors:Hypertension, Diabetes, Dyslipidemia and Current Smoker.  Sonographer:    Lucendia Herrlich Referring Phys: [id number] Sebasthian Stailey IMPRESSIONS  1. Left ventricular ejection fraction, by estimation, is 60 to 65%. The left ventricle has normal function. The left ventricle has no regional wall motion  abnormalities. Left ventricular diastolic parameters were normal.  2. Right ventricular systolic function is normal. The right ventricular size is normal.  3. The mitral valve is grossly normal. No evidence of mitral valve regurgitation.  4. The aortic valve is tricuspid. Aortic valve regurgitation is not visualized. No aortic stenosis is present.  5. The inferior vena cava is normal in size with greater than 50% respiratory variability, suggesting right atrial pressure of 3 mmHg. Comparison(s): No significant change from prior study. Conclusion(s)/Recommendation(s): Normal biventricular function without evidence of hemodynamically significant valvular heart disease. FINDINGS  Left Ventricle: Left ventricular ejection fraction, by estimation, is 60 to 65%. The left ventricle has normal function. The left ventricle has no regional wall motion abnormalities. The left ventricular internal cavity size was normal in size. There is  no left ventricular hypertrophy. Left ventricular diastolic parameters were normal. Right Ventricle: The right ventricular size is normal. No increase in right ventricular wall thickness. Right ventricular systolic function is normal. Left Atrium: Left atrial size was normal in size. Right Atrium: Right atrial size was normal in size. Pericardium: There is no evidence of pericardial effusion. Mitral Valve: The mitral valve is grossly normal. No evidence of mitral valve regurgitation. Tricuspid Valve: The tricuspid valve is normal in structure. Tricuspid valve regurgitation is trivial. Aortic  Valve: The aortic valve is tricuspid. Aortic valve regurgitation is not visualized. No aortic stenosis is present. Aortic valve mean gradient measures 7.0 mmHg. Aortic valve peak gradient measures 12.8 mmHg. Aortic valve area, by VTI measures 1.86  cm. Pulmonic Valve: The pulmonic valve was not well visualized. Pulmonic valve regurgitation is trivial. Aorta: The aortic root is normal in size and  structure. Venous: The inferior vena cava is normal in size with greater than 50% respiratory variability, suggesting right atrial pressure of 3 mmHg. IAS/Shunts: The atrial septum is grossly normal.  LEFT VENTRICLE PLAX 2D LVIDd:         4.70 cm   Diastology LVIDs:         3.45 cm   LV e' medial:    10.10 cm/s LV PW:         1.20 cm   LV E/e' medial:  8.2 LV IVS:        0.90 cm   LV e' lateral:   12.60 cm/s LVOT diam:     2.00 cm   LV E/e' lateral: 6.6 LV SV:         75 LV SV Index:   33 LVOT Area:     3.14 cm  RIGHT VENTRICLE             IVC RV S prime:     13.50 cm/s  IVC diam: 1.90 cm TAPSE (M-mode): 2.4 cm LEFT ATRIUM             Index        RIGHT ATRIUM           Index LA diam:        3.40 cm 1.51 cm/m   RA Area:     13.60 cm LA Vol (A2C):   53.8 ml 23.83 ml/m  RA Volume:   34.10 ml  15.10 ml/m LA Vol (A4C):   54.3 ml 24.05 ml/m LA Biplane Vol: 57.4 ml 25.42 ml/m  AORTIC VALVE AV Area (Vmax):    2.00 cm AV Area (Vmean):   1.95 cm AV Area (VTI):     1.86 cm AV Vmax:           179.00 cm/s AV Vmean:          118.000 cm/s AV VTI:            0.403 m AV Peak Grad:      12.8 mmHg AV Mean Grad:      7.0 mmHg LVOT Vmax:         114.00 cm/s LVOT Vmean:        73.100 cm/s LVOT VTI:          0.238 m LVOT/AV VTI ratio: 0.59  AORTA Ao Root diam: 2.90 cm Ao Asc diam:  3.20 cm MITRAL VALVE               TRICUSPID VALVE MV Area (PHT): 3.37 cm    TR Peak grad:   10.1 mmHg MV Decel Time: 225 msec    TR Vmax:        159.00 cm/s MV E velocity: 82.60 cm/s MV A velocity: 70.40 cm/s  SHUNTS MV E/A ratio:  1.17        Systemic VTI:  0.24 m                            Systemic Diam: 2.00 cm Laurance Flatten MD Electronically signed by Herbert Seta  Pemberton MD Signature Date/Time: 01/31/2023/2:22:49 PM    Final    MR CERVICAL SPINE WO CONTRAST  Result Date: 01/27/2023 CLINICAL DATA:  Acute cervical myelopathy.  Fall. EXAM: MRI CERVICAL SPINE WITHOUT CONTRAST TECHNIQUE: Multiplanar, multisequence MR imaging of the cervical  spine was performed. No intravenous contrast was administered. COMPARISON:  Cervical spine CT from earlier the same day FINDINGS: Alignment: Physiologic. Vertebrae: No fracture, evidence of discitis, or bone lesion. Cord: Small T2 hyperintensity in the right cord at the level of C3-4. No cord hemorrhage or collection is seen. Posterior Fossa, vertebral arteries, paraspinal tissues: T2 hyperintensity within the deep intrinsic neck muscles posteriorly at the level of C3-C5 with adjacent fat edema. Prevertebral effusion at the same levels. No major ligamentous disruption noted. Disc levels: C2-3: Central protrusion contacting but not compressing the cord. C3-4: Central disc protrusion flattening the cord.  Negative facets. C4-5: Central herniation indenting the ventral cord. C5-6: Disc space narrowing and bulging with left paracentral protrusion contacting the left cord and exiting C6 nerve root. C6-7: ACDF with uncomplicated appearance. C7-T1:Mild disc bulging. IMPRESSION: 1. Soft tissue injury with prevertebral effusion and posterior strain at C2-C5. No occult fracture or major ligamentous disruption is seen. 2. Mild cord edema rightward at the level of C3-4 where there is a disc protrusion flattening the ventral cord. 3. Additional disc protrusions with cord contact at C2-3 and C4-5. 4. Uncomplicated C6-7 ACDF. Electronically Signed   By: Tiburcio Pea M.D.   On: 01/27/2023 06:49   CT Cervical Spine Wo Contrast  Result Date: 01/27/2023 CLINICAL DATA:  Fall EXAM: CT CERVICAL SPINE WITHOUT CONTRAST TECHNIQUE: Multidetector CT imaging of the cervical spine was performed without intravenous contrast. Multiplanar CT image reconstructions were also generated. RADIATION DOSE REDUCTION: This exam was performed according to the departmental dose-optimization program which includes automated exposure control, adjustment of the mA and/or kV according to patient size and/or use of iterative reconstruction technique.  COMPARISON:  None Available. Findings: Alignment: No static subluxation. Facets are aligned. Occipital condyles and the lateral masses of C1 and C2 are normally approximated. Skull base and vertebrae: No acute fracture. C6-7 ACDF. Soft tissues and spinal canal: No prevertebral fluid or swelling. No visible canal hematoma. Disc levels: No advanced spinal canal or neural foraminal stenosis. Upper chest: No pneumothorax, pulmonary nodule or pleural effusion. Other: Normal visualized paraspinal cervical soft tissues. IMPRESSION: 1. No acute fracture or static subluxation of the cervical spine. 2. C6-7 ACDF. Electronically Signed   By: Deatra Robinson M.D.   On: 01/27/2023 01:59   CT Head Wo Contrast  Result Date: 01/27/2023 CLINICAL DATA:  Trauma.  Fall. EXAM: CT HEAD WITHOUT CONTRAST TECHNIQUE: Contiguous axial images were obtained from the base of the skull through the vertex without intravenous contrast. RADIATION DOSE REDUCTION: This exam was performed according to the departmental dose-optimization program which includes automated exposure control, adjustment of the mA and/or kV according to patient size and/or use of iterative reconstruction technique. COMPARISON:  Head CT 08/01/2014 FINDINGS: Brain: No evidence of acute infarction, hemorrhage, hydrocephalus, extra-axial collection or mass lesion/mass effect. Vascular: No hyperdense vessel or unexpected calcification. Skull: Normal. Negative for fracture or focal lesion. Sinuses/Orbits: No acute finding. Other: There is scalp soft tissue swelling overlying the superior frontal region. There is soft tissue prominence of the adenoids. IMPRESSION: No acute intracranial abnormality. Electronically Signed   By: Darliss Cheney M.D.   On: 01/27/2023 01:57   DG Chest Portable 1 View  Result Date: 01/27/2023 CLINICAL DATA:  Evaluate for injury, fall. EXAM: PORTABLE CHEST 1 VIEW COMPARISON:  Chest x-ray 09/13/2022 FINDINGS: The heart size and mediastinal contours are  within normal limits. Both lungs are clear. No acute fractures are seen. Cervical spinal fusion plate is present. IMPRESSION: No active disease. Electronically Signed   By: Darliss Cheney M.D.   On: 01/27/2023 01:35   DG Shoulder Left  Result Date: 01/27/2023 CLINICAL DATA:  Fall EXAM: LEFT SHOULDER - 2+ VIEW COMPARISON:  None Available. FINDINGS: Degenerative changes in the left shoulder most pronounced in the left [facility] joint with joint space narrowing and spurring. No acute bony abnormality. Specifically, no fracture, subluxation, or dislocation. IMPRESSION: Degenerative changes.  No acute bony abnormality. Electronically Signed   By: Charlett Nose M.D.   On: 01/27/2023 01:30   DG Shoulder Right  Result Date: 01/27/2023 CLINICAL DATA:  Fall EXAM: RIGHT SHOULDER - 2+ VIEW COMPARISON:  Chest x-ray 09/13/2022 FINDINGS: There appears to have been prior resection of the distal clavicle, unchanged since prior chest x-ray. No acute fracture, subluxation or dislocation. Glenohumeral joint is intact. IMPRESSION: No acute bony abnormality. Electronically Signed   By: Charlett Nose M.D.   On: 01/27/2023 01:30   DG Lumbar Spine 2-3 Views  Result Date: 01/06/2023 Standing lumbar spine x-rays were obtained in clinic today.  No acute injuries noted.  She has diffuse degenerative changes, with complete loss of joint space in the lower lumbar spine.  There are associated anterior based osteophytes.  No anterolisthesis.  No bony lesions.  Impression: Diffuse degenerative changes throughout the lumbar spine, without anterolisthesis or acute injury.    Labs: BNP (last 3 results) No results for input(s): "BNP" in the last 8760 hours. Basic Metabolic Panel: Recent Labs  Lab 01/27/23 0056 01/27/23 2120 01/28/23 0627 01/29/23 0234 01/30/23 0232 01/31/23 0817  NA 135  --  135 136 137 134*  K 3.6  --  4.3 4.3 4.3 4.1  CL 102  --  102 103 103 102  CO2 22  --  21* 24 25 24   GLUCOSE 153*  --  168* 159* 137* 146*  BUN  28*  --  26* 35* 36* 35*  CREATININE 1.04* 1.07* 0.91 1.18* 0.98 0.99  CALCIUM 8.8*  --  9.3 9.2 9.1 9.0  MG  --   --   --  2.0 2.0  --   PHOS  --   --   --  3.8 3.5  --    Liver Function Tests: Recent Labs  Lab 01/27/23 0056 01/29/23 0234 01/30/23 0232  AST 19  --   --   ALT 20  --   --   ALKPHOS 80  --   --   BILITOT 0.5  --   --   PROT 7.6  --   --   ALBUMIN 3.6 3.3* 3.2*   No results for input(s): "LIPASE", "AMYLASE" in the last 168 hours. No results for input(s): "AMMONIA" in the last 168 hours. CBC: Recent Labs  Lab 01/27/23 0056 01/27/23 2120 01/28/23 0627 01/29/23 0234 01/30/23 0232 01/31/23 0817  WBC 12.3* 18.3* 19.0* 18.2* 17.1* 17.3*  NEUTROABS 7.9*  --   --   --   --   --   HGB 11.6* 12.6 12.4 11.3* 11.8* 12.9  HCT 36.2 38.6 38.1 34.5* 35.8* 38.5  MCV 94.0 90.6 93.8 92.2 93.0 91.4  PLT 239 319 281 283 279 274   Cardiac Enzymes: No results for input(s): "CKTOTAL", "CKMB", "CKMBINDEX", "TROPONINI" in the last  168 hours. BNP: Invalid input(s): "POCBNP" CBG: Recent Labs  Lab 01/30/23 1334 01/30/23 1616 01/30/23 2138 01/31/23 0648 01/31/23 1104  GLUCAP 135* 213* 140* 146* 196*  Anemia work up No results for input(s): "VITAMINB12", "FOLATE", "FERRITIN", "TIBC", "IRON", "RETICCTPCT" in the last 72 hours. Urinalysis    Component Value Date/Time   COLORURINE YELLOW 03/11/2015 1320   APPEARANCEUR CLEAR 03/11/2015 1320   LABSPEC 1.015 03/11/2015 1320   PHURINE 7.0 03/11/2015 1320   GLUCOSEU NEGATIVE 03/11/2015 1320   HGBUR NEGATIVE 03/11/2015 1320   BILIRUBINUR NEGATIVE 03/11/2015 1320   KETONESUR NEGATIVE 03/11/2015 1320   PROTEINUR NEGATIVE 03/11/2015 1320   UROBILINOGEN 0.2 03/11/2015 1320   NITRITE NEGATIVE 03/11/2015 1320   LEUKOCYTESUR NEGATIVE 03/11/2015 1320   Sepsis Labs Recent Labs  Lab 01/28/23 0627 01/29/23 0234 01/30/23 0232 01/31/23 0817  WBC 19.0* 18.2* 17.1* 17.3*   Microbiology No results found for this or any previous  visit (from the past 240 hour(s)).  Time coordinating discharge: 25 minutes  SIGNED: Lanae Boast, MD  [facility] 01/31/2023, 2:28 PM  If 7PM-7AM, please contact night-coverage [email or web address]

## 2023-01-31 NOTE — Plan of Care (Signed)

## 2023-01-31 NOTE — Progress Notes (Signed)
Echocardiogram 2D Echocardiogram has been performed.  Brenda Taylor 01/31/2023, 2:03 PM

## 2023-01-31 NOTE — TOC Transition Note (Signed)
Transition of Care [facility]) - CM/SW Discharge Note   Patient Details  Name: Brenda Taylor MRN: [id number] Date of Birth: 04-02-1971  Transition of Care [facility]) CM/SW Contact:  Kermit Balo, RN Phone Number: 01/31/2023, 2:56 PM   Clinical Narrative:    Pt is discharging home with outpatient therapy through [facility] Information on the AVS.  Pt has DME at the bedside for home.  Family will provide transportation home.   Final next level of care: OP Rehab Barriers to Discharge: No Barriers Identified   Patient Goals and CMS Choice   Choice offered to / list presented to : Patient  Discharge Placement                         Discharge Plan and Services Additional resources added to the After Visit Summary for     Discharge Planning Services: CM Consult            DME Arranged: Shower stool, Walker rolling with seat, Bedside commode DME Agency: [employer] Date DME Agency Contacted: 01/31/23   Representative spoke with at DME Agency: Mickle Asper            Social Determinants of Health (SDOH) Interventions SDOH Screenings   Food Insecurity: No Food Insecurity (01/27/2023)  Housing: Low Risk  (01/27/2023)  Transportation Needs: No Transportation Needs (01/27/2023)  Utilities: Not At Risk (01/27/2023)  Alcohol Screen: Low Risk  (12/26/2020)  Depression (PHQ2-9): Low Risk  (01/01/2023)  Financial Resource Strain: Medium Risk (12/26/2020)  Physical Activity: Inactive (12/26/2020)  Social Connections: Socially Isolated (12/26/2020)  Stress: Stress Concern Present (12/26/2020)  Tobacco Use: High Risk (01/27/2023)     Readmission Risk Interventions     No data to display

## 2023-01-31 NOTE — Progress Notes (Signed)
Occupational Therapy Treatment Patient Details Name: JAHNYLA PARRILLO MRN: [id number] DOB: [birth year] Today's Date: 01/31/2023   History of present illness Pt is a 52 y/o female presenting after a syncopal episode and fall down front porch steps. MRI+ for central cord syndrome. No sx intervention recommended. PMH: DDD, GERD, hernia, back surgery x5   OT comments  Pt continues to make excellent progress towards OT goals. Pt able to mobilize to/from bathroom and manage toileting tasks Independently. Pt continues to requires assist for cervical collar mgmt due to pain and impaired shoulder ROM. Focused on education re: tub shower transfer w/ pt able to manage, stair mgmt, Rollator height adjustment and cervical collar pad washing/care.    Recommendations for follow up therapy are one component of a multi-disciplinary discharge planning process, led by the attending physician.  Recommendations may be updated based on patient status, additional functional criteria and insurance authorization.    Assistance Recommended at Discharge Set up Supervision/Assistance  Patient can return home with the following  Assistance with cooking/housework;Assist for transportation;Help with stairs or ramp for entrance   Equipment Recommendations  BSC/3in1;Tub/shower seat;Other (comment) Aeronautical engineer)    Recommendations for Other Services      Precautions / Restrictions Precautions Precautions: Fall;Cervical Precaution Comments: reviewed precautions, pt recalled no lifting Required Braces or Orthoses: Cervical Brace Cervical Brace: Hard collar (may take off in bed, to shower, and to go to bathroom) Restrictions Weight Bearing Restrictions: No       Mobility Bed Mobility Overal bed mobility: Modified Independent                  Transfers Overall transfer level: Modified independent Equipment used: Rollator (4 wheels), None Transfers: Sit to/from Stand Sit to Stand: Modified independent  (Device/Increase time)                 Balance Overall balance assessment: Needs assistance Sitting-balance support: Feet supported, No upper extremity supported Sitting balance-Leahy Scale: Good     Standing balance support: Bilateral upper extremity supported, During functional activity Standing balance-Leahy Scale: Fair Standing balance comment: Fair+, able to walk to bathroom without AD                           ADL either performed or assessed with clinical judgement   ADL Overall ADL's : Needs assistance/impaired     Grooming: Modified independent;Standing;Wash/dry hands                   Toilet Transfer: Independent;Ambulation Toilet Transfer Details (indicate cue type and reason): no AD to regular toilet; use of grab bars due to low toilet Toileting- Clothing Manipulation and Hygiene: Modified independent;Sit to/from stand;Sitting/lateral lean   Tub/ Shower Transfer: Supervision/safety;Ambulation;Rollator (4 wheels) Tub/Shower Transfer Details (indicate cue type and reason): use of wall for support; able to step over tub with cautious pace. Pt inquiring about shower seat   General ADL Comments: Use of rollator around unit, tub transfer practice in unit gym and cervical collar mgmt standing at sink looking in mirror (Min A needed to don due to pain/impaired ROM when attempting to reach for brace)    Extremity/Trunk Assessment Upper Extremity Assessment Upper Extremity Assessment: RUE deficits/detail;LUE deficits/detail RUE Deficits / Details: B pins and needles of B hands, 4-/5 grasp, opposition WFL though slow. only able to raise UE to approx 90* due to pain.   Lower Extremity Assessment Lower Extremity Assessment: Defer to PT evaluation  Vision   Vision Assessment?: No apparent visual deficits   Perception     Praxis      Cognition Arousal/Alertness: Awake/alert Behavior During Therapy: WFL for tasks assessed/performed Overall  Cognitive Status: Within Functional Limits for tasks assessed                                          Exercises      Shoulder Instructions       General Comments Assisted pt in adjusting Rollator height and washing cervical collar pad due to soilage- educated on warm water/soap and hanging to dry    Pertinent Vitals/ Pain       Pain Assessment Pain Assessment: Faces Faces Pain Scale: Hurts a little bit Pain Location: back of neck/shoulders when lifting UE Pain Descriptors / Indicators: Sore Pain Intervention(s): Monitored during session, Limited activity within patient's tolerance  Home Living                                          Prior Functioning/Environment              Frequency  Min 2X/week        Progress Toward Goals  OT Goals(current goals can now be found in the care plan section)  Progress towards OT goals: Progressing toward goals  Acute Rehab OT Goals Patient Stated Goal: home today, decrease pain and get back to work OT Goal Formulation: With patient Time For Goal Achievement: 02/11/23 Potential to Achieve Goals: Good ADL Goals Pt Will Perform Grooming: with modified independence;standing Pt Will Transfer to Toilet: with modified independence;ambulating Pt Will Perform Tub/Shower Transfer: Tub transfer;with modified independence;ambulating Pt/caregiver will Perform Home Exercise Program: Increased strength;Both right and left upper extremity;With theraputty;With written HEP provided  Plan Discharge plan remains appropriate    Co-evaluation                 AM-PAC OT "6 Clicks" Daily Activity     Outcome Measure   Help from another person eating meals?: None Help from another person taking care of personal grooming?: None Help from another person toileting, which includes using toliet, bedpan, or urinal?: None Help from another person bathing (including washing, rinsing, drying)?: A Little Help  from another person to put on and taking off regular upper body clothing?: A Little Help from another person to put on and taking off regular lower body clothing?: A Little 6 Click Score: 21    End of Session Equipment Utilized During Treatment: Rollator (4 wheels);Cervical collar  OT Visit Diagnosis: Unsteadiness on feet (R26.81);Other abnormalities of gait and mobility (R26.89);Muscle weakness (generalized) (M62.81)   Activity Tolerance Patient tolerated treatment well   Patient Left in bed;with call bell/phone within reach;with bed alarm set   Nurse Communication Mobility status;Other (comment) (request for stool softener)        Time: 2130-8657 OT Time Calculation (min): 28 min  Charges: OT General Charges $OT Visit: 1 Visit OT Treatments $Self Care/Home Management : 23-37 mins  Bradd Canary, OTR/L Acute Rehab Services Office: [phone]   Lorre Munroe 01/31/2023, 12:39 PM

## 2023-02-04 ENCOUNTER — Telehealth: Payer: Self-pay

## 2023-02-04 NOTE — Transitions of Care (Post Inpatient/ED Visit) (Signed)
02/04/2023  Name: Brenda Taylor MRN: [id number] DOB: 08-23-1970  Today's TOC FU Call Status: Today's TOC FU Call Status:: Successful TOC FU Call Competed TOC FU Call Complete Date: 02/04/23  Transition Care Management Follow-up Telephone Call Date of Discharge: 01/31/23 Discharge Facility: [facility]) Type of Discharge: Inpatient Admission Primary Inpatient Discharge Diagnosis:: central cord syndrome How have you been since you were released from the hospital?: Better Any questions or concerns?: No  Items Reviewed: Did you receive and understand the discharge instructions provided?: Yes Medications obtained,verified, and reconciled?: Yes (Medications Reviewed) Any new allergies since your discharge?: No Dietary orders reviewed?: Yes Do you have support at home?: Yes People in Home: child(ren), adult  Medications Reviewed Today: Medications Reviewed Today     Reviewed by Karena Addison, LPN (Licensed Practical Nurse) on 02/04/23 at 1528  Med List Status: <None>   Medication Order Taking? Sig Documenting Provider Last Dose Status Informant  ALPRAZolam (XANAX) 0.5 MG tablet [id number] No TAKE ONE TABLET BY MOUTH AT BEDTIME AS NEEDED FOR ANXIETY.  Patient taking differently: Take 0.5 mg by mouth at bedtime as needed for sleep or anxiety.   Anabel Halon, MD Past Week Active Self, Pharmacy Records  benzonatate (TESSALON) 200 MG capsule [id number] No TAKE 1 CAPSULE BY MOUTH 2 TIMES DAILY AS NEEDED FOR COUGH.  Patient taking differently: Take 200 mg by mouth daily.   Anabel Halon, MD 01/26/2023 Active Self, Pharmacy Records  busPIRone (BUSPAR) 15 MG tablet [id number] No Take 1 tablet (15 mg total) by mouth daily. Anabel Halon, MD 01/26/2023 Active Self, Pharmacy Records           Med Note (CRUTHIS, Seth Bake Jan 27, 2023 10:58 AM) LF 12/07/22 for 30 DS. Pt is adamant she is still taking this medication daily. Dispense report does not support this claim.   cyclobenzaprine  (FLEXERIL) 10 MG tablet [id number] No Take 10 mg by mouth as needed for muscle spasms. [provider] Past Month Active Self, Pharmacy Records  gabapentin (NEURONTIN) 300 MG capsule [id number]  Take 2 capsules (600 mg total) by mouth 3 (three) times daily. Lanae Boast, MD  Active   hydrochlorothiazide (HYDRODIURIL) 25 MG tablet [id number] No Take 1 tablet (25 mg total) by mouth daily. Anabel Halon, MD 01/26/2023 Active Self, Pharmacy Records  metFORMIN (GLUCOPHAGE-XR) 500 MG 24 hr tablet [id number] No Take 1 tablet (500 mg total) by mouth daily with breakfast. Anabel Halon, MD 01/26/2023 Active Self, Pharmacy Records  methylPREDNISolone (MEDROL DOSEPAK) 4 MG TBPK tablet [id number]  Take the packet as instructed Kc, Dayna Barker, MD  Active   naproxen sodium (ALEVE) 220 MG tablet [id number] No Take 440 mg by mouth daily as needed (headache). [provider] Past Week Active Self, Pharmacy Records  olopatadine (PATADAY) 0.1 % ophthalmic solution [id number] No Place 1 drop into both eyes 2 (two) times daily.  Patient not taking: Reported on 01/27/2023   Anabel Halon, MD Not Taking Active Self, Pharmacy Records  oxyCODONE (OXY IR/ROXICODONE) 5 MG immediate release tablet [id number]  Take 1 tablet (5 mg total) by mouth every 6 (six) hours as needed for up to 12 doses for moderate pain. Lanae Boast, MD  Active   pantoprazole (PROTONIX) 40 MG tablet [id number] No Take 1 tablet (40 mg total) by mouth 2 (two) times daily before a meal.  Patient taking differently: Take 40 mg by mouth daily.   Lanelle Bal, DO 01/26/2023 Active Self, Pharmacy  Records  rosuvastatin (CRESTOR) 5 MG tablet [id number] No Take 1 tablet (5 mg total) by mouth daily. Anabel Halon, MD 01/26/2023 Active Self, Pharmacy Records            Home Care and Equipment/Supplies: Were Home Health Services Ordered?: NA Any new equipment or medical supplies ordered?: NA  Functional Questionnaire: Do you need assistance with  bathing/showering or dressing?: No Do you need assistance with meal preparation?: No Do you need assistance with eating?: No Do you have difficulty maintaining continence: No Do you need assistance with getting out of bed/getting out of a chair/moving?: No Do you have difficulty managing or taking your medications?: No  Follow up appointments reviewed: PCP Follow-up appointment confirmed?: Yes Date of PCP follow-up appointment?: 02/07/23 Follow-up Provider: [facility] Follow-up appointment confirmed?: No Reason Specialist Follow-Up Not Confirmed: Patient has Specialist Provider Number and will Call for Appointment Do you need transportation to your follow-up appointment?: No Do you understand care options if your condition(s) worsen?: Yes-patient verbalized understanding    SIGNATURE Karena Addison, LPN [facility] Nurse Health Advisor Direct Dial [phone]

## 2023-02-07 ENCOUNTER — Ambulatory Visit: Payer: Medicaid Other | Admitting: Internal Medicine

## 2023-02-07 ENCOUNTER — Encounter: Payer: Self-pay | Admitting: Internal Medicine

## 2023-02-07 VITALS — BP 117/82 | HR 86 | Ht 65.0 in | Wt 275.6 lb

## 2023-02-07 DIAGNOSIS — S14129D Central cord syndrome at unspecified level of cervical spinal cord, subsequent encounter: Secondary | ICD-10-CM

## 2023-02-07 DIAGNOSIS — R55 Syncope and collapse: Secondary | ICD-10-CM | POA: Diagnosis not present

## 2023-02-07 DIAGNOSIS — Z09 Encounter for follow-up examination after completed treatment for conditions other than malignant neoplasm: Secondary | ICD-10-CM

## 2023-02-07 DIAGNOSIS — M5136 Other intervertebral disc degeneration, lumbar region: Secondary | ICD-10-CM | POA: Insufficient documentation

## 2023-02-07 DIAGNOSIS — I10 Essential (primary) hypertension: Secondary | ICD-10-CM

## 2023-02-07 MED ORDER — OXYCODONE HCL 5 MG PO TABS
5.0000 mg | ORAL_TABLET | Freq: Four times a day (QID) | ORAL | 0 refills | Status: DC | PRN
Start: 2023-02-07 — End: 2023-12-04

## 2023-02-07 MED ORDER — CELECOXIB 200 MG PO CAPS
200.0000 mg | ORAL_CAPSULE | Freq: Every day | ORAL | 3 refills | Status: DC
Start: 2023-02-07 — End: 2023-06-03

## 2023-02-07 NOTE — Progress Notes (Signed)
Established Patient Office Visit  Subjective:  Patient ID: Brenda Taylor, female    DOB: [birth year]  Age: 52 y.o. MRN: [id number]  CC:  Chief Complaint  Patient presents with   Hospitalization Follow-up    Hospital Follow up     HPI Brenda Taylor is a 52 y.o. female with past medical history of hypertension, type 2 DM, anxiety, OA and colonic polyp who presents for follow-up after recent hospitalization from 01/26/23-01/31/23.  She was brought to ED by EMS after syncopal episode following severe cough while sitting on the porch and smoking cigarettes. She fell backward with LO>when she woke up, not able to move her arms and legs and EMS was called and transferred from [facility] to [facility] for MRI.  MRI cervical spine showed soft tissue injury with prevertebral effusion and posterior strain at C2-C5, mild cord edema rightward at the level of C3-4 where there is a disc protrusion flattening the ventral cord and additional disc protrusions with cord contact at C2-3 and C4-5. Neurosurgery consulted and recommended Decadron, neck collar and outpatient follow-up. PT OT followed closely, was on pain management with IV Dilaudid, oral Oxy, Flexeril.  She still complains of severe neck pain, bilateral shoulder pain and low back pain.  She has bilateral UE numbness and weakness, recently worse since the fall.  She has not seen by neurosurgeon yet in the outpatient setting since being discharged.  She is taking oxycodone 5 mg as needed for severe pain.  She is also taking gabapentin 600 mg 3 times daily for neuropathic pain.  She uses neck brace intermittently, but agrees to use it more regularly.  She is scheduled for PT and OT as well.  HTN: Her lisinopril was discontinued during recent hospitalization.  Her BP is well controlled.  Denies any chest pain, dyspnea or palpitations currently.  Past Medical History:  Diagnosis Date   Anxiety    Chronic pain    DDD (degenerative disc disease)    Diabetes mellitus  without complication (HCC)    GERD (gastroesophageal reflux disease)    HBP (high blood pressure)    Headache    Hernia    Multiple gastric ulcers    Pre-diabetes    Rotator cuff syndrome of left shoulder 10/09/2011   Ulcer disease     Past Surgical History:  Procedure Laterality Date   APPENDECTOMY     BACK SURGERY     x5   BIOPSY  05/04/2021   Procedure: BIOPSY;  Surgeon: Lanelle Bal, DO;  Location: [facility];  Service: Endoscopy;;   BREAST SURGERY     CARPAL TUNNEL RELEASE     bilateral   CHOLECYSTECTOMY     COLONOSCOPY  10/20/2009   LKG:MWNUUVO anal canal/left-side diverticula and multiple polyps (hyperplastic). poor prep compromised exam. Next TCS 09/2014.   COLONOSCOPY WITH PROPOFOL N/A 05/04/2021   Procedure: COLONOSCOPY WITH PROPOFOL;  Surgeon: Lanelle Bal, DO;  Location: [facility];  Service: Endoscopy;  Laterality: N/A;  9:45am   ESOPHAGOGASTRODUODENOSCOPY  03/06/2010   ZDG:UYQI quadrant distal esophageal/patent tubular esophagus/small HH, antral erosions and ulcerations. Bx negative for persistent H.pylori   ESOPHAGOGASTRODUODENOSCOPY  10/20/2009   HKV:QQVZD HH/prepyloric antral ulcer s/p bx (+h.pylori), ERE   ESOPHAGOGASTRODUODENOSCOPY (EGD) WITH PROPOFOL N/A 05/04/2021   Procedure: ESOPHAGOGASTRODUODENOSCOPY (EGD) WITH PROPOFOL;  Surgeon: Lanelle Bal, DO;  Location: [facility];  Service: Endoscopy;  Laterality: N/A;   INTRAUTERINE DEVICE INSERTION     LEEP N/A 02/14/2021  Procedure: LOOP ELECTROSURGICAL EXCISION PROCEDURE (LEEP);  Surgeon: Myna Hidalgo, DO;  Location: [facility];  Service: Gynecology;  Laterality: N/A;   MASTECTOMY, PARTIAL Left    x 2   MOUTH SURGERY     POLYPECTOMY  05/04/2021   Procedure: POLYPECTOMY;  Surgeon: Lanelle Bal, DO;  Location: [facility];  Service: Endoscopy;;   ROTATOR CUFF REPAIR     right   TUBAL LIGATION      Family History  Problem Relation Age of Onset   Heart disease Other     Arthritis Other    Cancer Other    Asthma Other    Diabetes Other    Heart disease Mother    Diabetes Mother    Cancer Mother    Non-Hodgkin's lymphoma Mother    Hypertension Mother    Arthritis Mother    Lung cancer Father    Neuropathy Father    Hypertension Father     Social History   Socioeconomic History   Marital status: Widowed    Spouse name: Not on file   Number of children: Not on file   Years of education: 9   Highest education level: Not on file  Occupational History   Occupation: Conservation officer, nature  Tobacco Use   Smoking status: Every Day    Current packs/day: 0.50    Types: Cigarettes    Passive exposure: Current   Smokeless tobacco: Never  Vaping Use   Vaping status: Never Used  Substance and Sexual Activity   Alcohol use: No   Drug use: Yes    Frequency: 7.0 times per week    Types: Marijuana    Comment: twice daily   Sexual activity: Yes    Birth control/protection: I.U.D.  Other Topics Concern   Not on file  Social History Narrative   Not on file   Social Determinants of Health   Financial Resource Strain: Medium Risk (12/26/2020)   Overall Financial Resource Strain (CARDIA)    Difficulty of Paying Living Expenses: Somewhat hard  Food Insecurity: No Food Insecurity (01/27/2023)   Hunger Vital Sign    Worried About Running Out of Food in the Last Year: Never true    Ran Out of Food in the Last Year: Never true  Transportation Needs: No Transportation Needs (01/27/2023)   PRAPARE - Administrator, Civil Service (Medical): No    Lack of Transportation (Non-Medical): No  Physical Activity: Inactive (12/26/2020)   Exercise Vital Sign    Days of Exercise per Week: 0 days    Minutes of Exercise per Session: 0 min  Stress: Stress Concern Present (12/26/2020)   [employer] of Occupational Health - Occupational Stress Questionnaire    Feeling of Stress : To some extent  Social Connections: Socially Isolated (12/26/2020)   Social Connection and  Isolation Panel [NHANES]    Frequency of Communication with Friends and Family: Three times a week    Frequency of Social Gatherings with Friends and Family: Once a week    Attends Religious Services: Never    Database administrator or Organizations: Not on file    Attends Banker Meetings: Never    Marital Status: Widowed  Intimate Partner Violence: Not At Risk (01/27/2023)   Humiliation, Afraid, Rape, and Kick questionnaire    Fear of Current or Ex-Partner: No    Emotionally Abused: No    Physically Abused: No    Sexually Abused: No    Outpatient Medications Prior to Visit  Medication Sig Dispense Refill   ALPRAZolam (XANAX) 0.5 MG tablet TAKE ONE TABLET BY MOUTH AT BEDTIME AS NEEDED FOR ANXIETY. (Patient taking differently: Take 0.5 mg by mouth at bedtime as needed for sleep or anxiety.) 30 tablet 2   benzonatate (TESSALON) 200 MG capsule TAKE 1 CAPSULE BY MOUTH 2 TIMES DAILY AS NEEDED FOR COUGH. (Patient taking differently: Take 200 mg by mouth daily.) 30 capsule 0   busPIRone (BUSPAR) 15 MG tablet Take 1 tablet (15 mg total) by mouth daily. 30 tablet 3   cyclobenzaprine (FLEXERIL) 10 MG tablet Take 10 mg by mouth as needed for muscle spasms.     gabapentin (NEURONTIN) 300 MG capsule Take 2 capsules (600 mg total) by mouth 3 (three) times daily. 180 capsule 0   hydrochlorothiazide (HYDRODIURIL) 25 MG tablet Take 1 tablet (25 mg total) by mouth daily. 30 tablet 5   metFORMIN (GLUCOPHAGE-XR) 500 MG 24 hr tablet Take 1 tablet (500 mg total) by mouth daily with breakfast. 30 tablet 3   olopatadine (PATADAY) 0.1 % ophthalmic solution Place 1 drop into both eyes 2 (two) times daily. (Patient not taking: Reported on 01/27/2023) 5 mL 1   pantoprazole (PROTONIX) 40 MG tablet Take 1 tablet (40 mg total) by mouth 2 (two) times daily before a meal. (Patient taking differently: Take 40 mg by mouth daily.) 60 tablet 11   rosuvastatin (CRESTOR) 5 MG tablet Take 1 tablet (5 mg total) by mouth  daily. 90 tablet 3   methylPREDNISolone (MEDROL DOSEPAK) 4 MG TBPK tablet Take the packet as instructed 1 each 0   naproxen sodium (ALEVE) 220 MG tablet Take 440 mg by mouth daily as needed (headache).     oxyCODONE (OXY IR/ROXICODONE) 5 MG immediate release tablet Take 1 tablet (5 mg total) by mouth every 6 (six) hours as needed for up to 12 doses for moderate pain. 12 tablet 0   No facility-administered medications prior to visit.    Allergies  Allergen Reactions   Amoxicillin Hives   Aspirin Other (See Comments)    Cannot take due to ulcers   Nsaids Other (See Comments)    Cannot take due to ulcers     ROS Review of Systems  Constitutional:  Negative for chills and fever.  HENT:  Negative for congestion, postnasal drip, sinus pressure, sinus pain and sore throat.   Eyes:  Negative for pain and discharge.  Respiratory:  Negative for cough and shortness of breath.   Cardiovascular:  Negative for chest pain and palpitations.  Gastrointestinal:  Negative for abdominal pain, diarrhea, nausea and vomiting.  Endocrine: Negative for polydipsia and polyuria.  Genitourinary:  Negative for dysuria and hematuria.  Musculoskeletal:  Positive for arthralgias, back pain, neck pain and neck stiffness.       Left thigh pain  Skin:  Negative for rash.  Neurological:  Positive for weakness (B/l hands) and numbness (b/l feet). Negative for dizziness, seizures, syncope, speech difficulty and light-headedness.  Psychiatric/Behavioral:  Positive for sleep disturbance. Negative for agitation and behavioral problems. The patient is nervous/anxious.       Objective:    Physical Exam Vitals reviewed.  Constitutional:      General: She is not in acute distress.    Appearance: She is obese. She is not diaphoretic.  HENT:     Head: Normocephalic.     Nose: Nose normal. No congestion.     Mouth/Throat:     Mouth: Mucous membranes are moist.     Pharynx:  No posterior oropharyngeal erythema.  Eyes:      General: No scleral icterus.    Extraocular Movements: Extraocular movements intact.  Cardiovascular:     Rate and Rhythm: Normal rate and regular rhythm.     Heart sounds: Normal heart sounds. No murmur heard. Pulmonary:     Breath sounds: Normal breath sounds. No wheezing or rales.  Musculoskeletal:        General: Tenderness (MCP joints of index and middle finger of left hand) present.     Cervical back: Neck supple. Tenderness present.     Right lower leg: No edema.     Left lower leg: No edema.  Skin:    General: Skin is warm.     Findings: No rash.  Neurological:     General: No focal deficit present.     Mental Status: She is alert and oriented to person, place, and time.     Sensory: Sensory deficit (B/l hands) present.     Motor: Weakness (B/l hands - 4/5) present.  Psychiatric:        Mood and Affect: Mood normal.        Behavior: Behavior normal.     BP 117/82 (BP Location: Right Arm, Patient Position: Sitting, Cuff Size: Large)   Pulse 86   Ht 5\' 5"  (1.651 m)   Wt 275 lb 9.6 oz (125 kg)   SpO2 94%   BMI 45.86 kg/m  Wt Readings from Last 3 Encounters:  02/07/23 275 lb 9.6 oz (125 kg)  01/27/23 273 lb (123.8 kg)  01/02/23 274 lb (124.3 kg)    Lab Results  Component Value Date   TSH 2.510 11/13/2022   Lab Results  Component Value Date   WBC 17.3 (H) 01/31/2023   HGB 12.9 01/31/2023   HCT 38.5 01/31/2023   MCV 91.4 01/31/2023   PLT 274 01/31/2023   Lab Results  Component Value Date   NA 134 (L) 01/31/2023   K 4.1 01/31/2023   CO2 24 01/31/2023   GLUCOSE 146 (H) 01/31/2023   BUN 35 (H) 01/31/2023   CREATININE 0.99 01/31/2023   BILITOT 0.5 01/27/2023   ALKPHOS 80 01/27/2023   AST 19 01/27/2023   ALT 20 01/27/2023   PROT 7.6 01/27/2023   ALBUMIN 3.2 (L) 01/30/2023   CALCIUM 9.0 01/31/2023   ANIONGAP 8 01/31/2023   EGFR 60 11/13/2022   Lab Results  Component Value Date   CHOL 155 11/13/2022   Lab Results  Component Value Date   HDL  29 (L) 11/13/2022   Lab Results  Component Value Date   LDLCALC 103 (H) 11/13/2022   Lab Results  Component Value Date   TRIG 125 11/13/2022   Lab Results  Component Value Date   CHOLHDL 5.3 (H) 11/13/2022   Lab Results  Component Value Date   HGBA1C 6.2 (H) 01/28/2023      Assessment & Plan:   Problem List Items Addressed This Visit       Cardiovascular and Mediastinum   Hypertension    BP: 117/82   Well-controlled with hydrochlorothiazide Lisinopril recently discontinued - cough improved Advised to stay compliant with medications Advised DASH diet and moderate exercise as tolerated        Nervous and Auditory   Central cord syndrome (HCC) - Primary    MRI of cervical spine reviewed Needs neurosurgery follow-up Recently completed oral prednisone Started Celebrex as needed for mild to moderate pain Continue oxycodone 5 mg as needed for severe pain -  refilled Needs to wear Neck Brace as advised by neurosurgery Follow up with PT and OT      Relevant Medications   celecoxib (CELEBREX) 200 MG capsule   oxyCODONE (OXY IR/ROXICODONE) 5 MG immediate release tablet     Musculoskeletal and Integument   DDD (degenerative disc disease), lumbar    Has chronic low back pain, history of lumbar spine surgeries Continue gabapentin 600 mg 3 times daily Flexeril as needed for muscle spasms Follow-up with neurosurgery      Relevant Medications   celecoxib (CELEBREX) 200 MG capsule   oxyCODONE (OXY IR/ROXICODONE) 5 MG immediate release tablet     Other   Syncope    Her episode of fall was likely due to vasovagal syncope due to severe coughing Had CT of head, unremarkable She has scalp hematoma currently, which should self resolve Maintain adequate hydration      Hospital discharge follow-up    Hospital chart reviewed, including discharge summary Medications reconciled and reviewed with the patient in detail       Meds ordered this encounter  Medications    celecoxib (CELEBREX) 200 MG capsule    Sig: Take 1 capsule (200 mg total) by mouth daily.    Dispense:  30 capsule    Refill:  3   oxyCODONE (OXY IR/ROXICODONE) 5 MG immediate release tablet    Sig: Take 1 tablet (5 mg total) by mouth every 6 (six) hours as needed for moderate pain.    Dispense:  30 tablet    Refill:  0    Follow-up: Return if symptoms worsen or fail to improve.    Anabel Halon, MD

## 2023-02-07 NOTE — Assessment & Plan Note (Signed)
MRI of cervical spine reviewed Needs neurosurgery follow-up Recently completed oral prednisone Started Celebrex as needed for mild to moderate pain Continue oxycodone 5 mg as needed for severe pain - refilled Needs to wear Neck Brace as advised by neurosurgery Follow up with PT and OT

## 2023-02-07 NOTE — Assessment & Plan Note (Addendum)
Her episode of fall was likely due to vasovagal syncope due to severe coughing Had CT of head, unremarkable She has scalp hematoma currently, which should self resolve Maintain adequate hydration

## 2023-02-07 NOTE — Assessment & Plan Note (Signed)
BP: 117/82   Well-controlled with hydrochlorothiazide Lisinopril recently discontinued - cough improved Advised to stay compliant with medications Advised DASH diet and moderate exercise as tolerated

## 2023-02-07 NOTE — Patient Instructions (Addendum)
Please continue taking Oxycodone as needed for severe pain.  Please take Celecoxib as needed for mild-moderate pain.  Please continue to take medications as prescribed.  Please continue to follow low carb diet and ambulate as tolerated.

## 2023-02-07 NOTE — Assessment & Plan Note (Signed)
Hospital chart reviewed, including discharge summary Medications reconciled and reviewed with the patient in detail 

## 2023-02-07 NOTE — Assessment & Plan Note (Signed)
Has chronic low back pain, history of lumbar spine surgeries Continue gabapentin 600 mg 3 times daily Flexeril as needed for muscle spasms Follow-up with neurosurgery

## 2023-02-13 ENCOUNTER — Ambulatory Visit (HOSPITAL_COMMUNITY): Payer: Medicaid Other

## 2023-02-15 ENCOUNTER — Ambulatory Visit (HOSPITAL_COMMUNITY): Payer: Medicaid Other | Attending: Occupational Therapy | Admitting: Occupational Therapy

## 2023-02-17 ENCOUNTER — Other Ambulatory Visit: Payer: Self-pay | Admitting: Internal Medicine

## 2023-02-17 DIAGNOSIS — J410 Simple chronic bronchitis: Secondary | ICD-10-CM

## 2023-02-18 ENCOUNTER — Ambulatory Visit: Payer: Medicaid Other | Admitting: Internal Medicine

## 2023-02-19 ENCOUNTER — Encounter: Payer: Self-pay | Admitting: Internal Medicine

## 2023-02-26 ENCOUNTER — Encounter (HOSPITAL_COMMUNITY): Payer: Self-pay | Admitting: Occupational Therapy

## 2023-02-26 ENCOUNTER — Other Ambulatory Visit: Payer: Self-pay

## 2023-02-26 ENCOUNTER — Ambulatory Visit (HOSPITAL_COMMUNITY): Payer: Medicaid Other | Attending: Student | Admitting: Occupational Therapy

## 2023-02-26 DIAGNOSIS — S14129A Central cord syndrome at unspecified level of cervical spinal cord, initial encounter: Secondary | ICD-10-CM | POA: Insufficient documentation

## 2023-02-26 DIAGNOSIS — R278 Other lack of coordination: Secondary | ICD-10-CM | POA: Insufficient documentation

## 2023-02-26 DIAGNOSIS — M5459 Other low back pain: Secondary | ICD-10-CM | POA: Insufficient documentation

## 2023-02-26 DIAGNOSIS — M6281 Muscle weakness (generalized): Secondary | ICD-10-CM | POA: Diagnosis present

## 2023-02-26 DIAGNOSIS — R262 Difficulty in walking, not elsewhere classified: Secondary | ICD-10-CM | POA: Diagnosis present

## 2023-02-26 DIAGNOSIS — R29818 Other symptoms and signs involving the nervous system: Secondary | ICD-10-CM | POA: Insufficient documentation

## 2023-02-26 NOTE — Therapy (Signed)
OUTPATIENT OCCUPATIONAL THERAPY NEURO EVALUATION  Patient Name: Brenda Taylor MRN: [id number] DOB:1971/01/09, 52 y.o., female Today's Date: 02/26/2023    END OF SESSION:  OT End of Session - 02/26/23 1014     Visit Number 1    Number of Visits 8    Date for OT Re-Evaluation 03/28/23    Authorization Type [place] Medicaid    Authorization Time Period Requesting 8 visits    Authorization - Visit Number 0    Authorization - Number of Visits 8    OT Start Time 0920   pt arrived late   OT Stop Time 0946    OT Time Calculation (min) 26 min    Activity Tolerance Patient tolerated treatment well    Behavior During Therapy [facility] for tasks assessed/performed             Past Medical History:  Diagnosis Date   Anxiety    Chronic pain    DDD (degenerative disc disease)    Diabetes mellitus without complication (HCC)    GERD (gastroesophageal reflux disease)    HBP (high blood pressure)    Headache    Hernia    Multiple gastric ulcers    Pre-diabetes    Rotator cuff syndrome of left shoulder 10/09/2011   Ulcer disease    Past Surgical History:  Procedure Laterality Date   APPENDECTOMY     BACK SURGERY     x5   BIOPSY  05/04/2021   Procedure: BIOPSY;  Surgeon: Lanelle Bal, DO;  [facility];  Service: Endoscopy;;   BREAST SURGERY     CARPAL TUNNEL RELEASE     bilateral   CHOLECYSTECTOMY     COLONOSCOPY  10/20/2009   WUJ:WJXBJYN anal canal/left-side diverticula and multiple polyps (hyperplastic). poor prep compromised exam. Next TCS 09/2014.   COLONOSCOPY WITH PROPOFOL N/A 05/04/2021   Procedure: COLONOSCOPY WITH PROPOFOL;  Surgeon: Lanelle Bal, DO;  [facility];  Service: Endoscopy;  Laterality: N/A;  9:45am   ESOPHAGOGASTRODUODENOSCOPY  03/06/2010   WGN:FAOZ quadrant distal esophageal/patent tubular esophagus/small HH, antral erosions and ulcerations. Bx negative for persistent H.pylori   ESOPHAGOGASTRODUODENOSCOPY  10/20/2009   HYQ:MVHQI  HH/prepyloric antral ulcer s/p bx (+h.pylori), ERE   ESOPHAGOGASTRODUODENOSCOPY (EGD) WITH PROPOFOL N/A 05/04/2021   Procedure: ESOPHAGOGASTRODUODENOSCOPY (EGD) WITH PROPOFOL;  Surgeon: Lanelle Bal, DO;  [facility];  Service: Endoscopy;  Laterality: N/A;   INTRAUTERINE DEVICE INSERTION     LEEP N/A 02/14/2021   Procedure: LOOP ELECTROSURGICAL EXCISION PROCEDURE (LEEP);  Surgeon: Myna Hidalgo, DO;  Location: [facility];  Service: Gynecology;  Laterality: N/A;   MASTECTOMY, PARTIAL Left    x 2   MOUTH SURGERY     POLYPECTOMY  05/04/2021   Procedure: POLYPECTOMY;  Surgeon: Lanelle Bal, DO;  [facility];  Service: Endoscopy;;   ROTATOR CUFF REPAIR     right   TUBAL LIGATION     Patient Active Problem List   Diagnosis Date Noted   DDD (degenerative disc disease), lumbar 02/07/2023   Hospital discharge follow-up 02/07/2023   Syncope 01/27/2023   Central cord syndrome (HCC) 01/27/2023   Allergic conjunctivitis of both eyes 01/02/2023   Meralgia paresthetica of left side 01/01/2023   Type 2 diabetes mellitus with hyperlipidemia (HCC) 11/14/2022   Plantar wart of both feet 11/13/2022   Simple chronic bronchitis (HCC) 11/13/2022   Acute sinusitis 09/13/2022   Sciatica 04/30/2022   Primary osteoarthritis of hands, bilateral 03/06/2022  Gastroesophageal reflux disease without esophagitis 03/06/2022   Restless legs syndrome (RLS) 07/18/2021   Intertrigo 05/30/2021   Early satiety 04/10/2021   Encounter for general adult medical examination with abnormal findings 12/15/2020   Mixed hyperlipidemia 12/15/2020   Vitamin D deficiency 09/15/2020   Osteoarthritis of right foot 07/05/2020   Right hand weakness 06/28/2020   History of fusion of cervical spine 06/28/2020   Arthritis 06/01/2020   Diabetic neuropathy (HCC) 06/01/2020   Hearing problem of right ear 05/02/2020   Obesity (BMI 30-39.9) 05/02/2020   Tobacco abuse 05/02/2020   Prediabetes 05/02/2020    GAD (generalized anxiety disorder) 05/07/2019   Hypertension 04/23/2019   HNP (herniated nucleus pulposus), lumbar 07/03/2013   Chronic idiopathic constipation 10/31/2012   PCP: Dr. Trena Platt REFERRING PROVIDER: Candelaria Stagers, MD  ONSET DATE: 01/27/23  REFERRING DIAG: Central cord syndrome  THERAPY DIAG:  Other symptoms and signs involving the nervous system  Other lack of coordination  Rationale for Evaluation and Treatment: Rehabilitation  SUBJECTIVE:   SUBJECTIVE STATEMENT: S: My leg is bothering me the most.  Pt accompanied by: self  PERTINENT HISTORY: Pt is a 52 y/o female s/p fall on 01/27/23 secondary to syncopal episode. MRI positive for central cord syndrome, no surgical intervention required. Pt was discharged on 01/31/23. Pt presents reporting nerve pain, weakness in BUE L>R.   PRECAUTIONS: Fall  WEIGHT BEARING RESTRICTIONS: No  PAIN:  Are you having pain? Yes: NPRS scale: 5/10 Pain location: generalized Pain description: aching and sore Aggravating factors: weather Relieving factors: rest  FALLS: Has patient fallen in last 6 months? Yes. Number of falls 1  PLOF: Independent  PATIENT GOALS: To get stronger and be able to do more.   OBJECTIVE:   HAND DOMINANCE: Right  ADLs: Overall ADLs: Pt reports gripping and pinching using the left hand. Delayed motor planning and coordination in the left hand requiring increased time to complete tasks. Pt reports difficulty with housework, reaching overhead and behind back. Limited strength impacting all ADLs.   FUNCTIONAL OUTCOME MEASURES: Quick Dash: 38.64  UPPER EXTREMITY ROM:    Active ROM Right eval Left eval  Shoulder flexion 95 80  Shoulder abduction 90 75  Shoulder internal rotation 90 90  Shoulder external rotation 75 30  (Blank rows = not tested)  UPPER EXTREMITY MMT:     MMT Right eval Left eval  Shoulder flexion 3-/5 3-/5  Shoulder abduction 3-/5 3-/5  Shoulder internal rotation 3/5 3/5   Shoulder external rotation 3/5 3-/5  Elbow flexion 5/5 4+/5  Elbow extension 5/5 4/5  Wrist flexion 5/5 4/5  Wrist extension 5/5 4/5  Wrist ulnar deviation 4+/5 4+/5  Wrist radial deviation 5/5 5/5  Wrist pronation 5/5 4/5  Wrist supination 5/5 4/5  (Blank rows = not tested)  HAND FUNCTION: Grip strength: Right: 38 lbs; Left: 5 lbs, Lateral pinch: Right: 8 lbs, Left: 3 lbs, and 3 point pinch: Right: 5 lbs, Left: 2 lbs  COORDINATION: 9 Hole Peg test: Right: 25.58 sec; Left: 34.92 sec  SENSATION: Tingling, numbness in hands and feet  COGNITION: Overall cognitive status: Within functional limits for tasks assessed   TODAY'S TREATMENT:  DATE: N/A-eval only     PATIENT EDUCATION: Education details: pt had to leave early, give next session; educated on plan for therapy Person educated: Patient Education method: Explanation Education comprehension: verbalized understanding  HOME EXERCISE PROGRAM: Give at first session   GOALS: Goals reviewed with patient? Yes  SHORT TERM GOALS: Target date: 03/30/23  Pt will be provided with and educated on HEP to improve mobility and strength in BUE required for ADL completion.   Goal status: INITIAL  2.  Pt will increase BUE strength to 4+/5 or greater, to improve ability to perform caregiving tasks to children.   Goal status: INITIAL  3.  Pt will increase grip strength in bilateral hands by 10# or greater to improve ability to grasp and hold items during cooking or housework.   Goal status: INITIAL  4.  Pt will increase left fine motor coordination required for tedious tasks such as fixing hair, by completing 9 hole peg test in under 30".  Goal status: INITIAL  5.  Pt will be educated on AE/DME available to assist with ADLs and improve independence and success with completion.   Goal status: INITIAL  6.   Pt will report pain of 4/10 or less to improve ability to sleep for 2+ consecutive hours.    Goal status: INITIAL    ASSESSMENT:  CLINICAL IMPRESSION: Patient is a 52 y.o. female who was seen today for occupational therapy evaluation for central cord syndrome. Pt presenting with BUE weakness, L>R, pain in bilateral shoulders and hands, decreased motor planning and coordination L>R.  Pt reports generalized pain, also has hx of carpal tunnel, reports MD is questioning neuropathy as well. Pt reports difficulty completing ADLs and caregiving tasks due to pain, weakness, coordination difficulties, and fatigue.   PERFORMANCE DEFICITS: in functional skills including IADLs, coordination, dexterity, ROM, strength, pain, fascial restrictions, muscle spasms, Fine motor control, Gross motor control, and UE functional use, ADLs  IMPAIRMENTS: are limiting patient from ADLs, IADLs, rest and sleep, and leisure.   CO-MORBIDITIES: may have co-morbidities  that affects occupational performance. Patient will benefit from skilled OT to address above impairments and improve overall function.  MODIFICATION OR ASSISTANCE TO COMPLETE EVALUATION: Min-Moderate modification of tasks or assist with assess necessary to complete an evaluation.  OT OCCUPATIONAL PROFILE AND HISTORY: Detailed assessment: Review of records and additional review of physical, cognitive, psychosocial history related to current functional performance.  CLINICAL DECISION MAKING: LOW - limited treatment options, no task modification necessary  REHAB POTENTIAL: Good  EVALUATION COMPLEXITY: Low    PLAN:  OT FREQUENCY: 2x/week  OT DURATION: 4 weeks  PLANNED INTERVENTIONS: therapeutic exercise, therapeutic activity, neuromuscular re-education, manual therapy, passive range of motion, splinting, patient/family education, and DME and/or AE instructions  RECOMMENDED OTHER SERVICES: PT  CONSULTED AND AGREED WITH PLAN OF CARE: Patient  PLAN  FOR NEXT SESSION: Provide HEP, initiate gentle BUE stretching and A/ROM progressing to strengthening. Grip and pinch tasks, coordination exercises.    Ezra Sites, OTR/L  [phone] 02/26/2023, 10:15 AM

## 2023-03-06 ENCOUNTER — Encounter (HOSPITAL_COMMUNITY): Payer: Self-pay | Admitting: Physical Therapy

## 2023-03-06 ENCOUNTER — Other Ambulatory Visit: Payer: Self-pay

## 2023-03-06 ENCOUNTER — Ambulatory Visit (HOSPITAL_COMMUNITY): Payer: Medicaid Other | Admitting: Physical Therapy

## 2023-03-06 DIAGNOSIS — R262 Difficulty in walking, not elsewhere classified: Secondary | ICD-10-CM

## 2023-03-06 DIAGNOSIS — R29818 Other symptoms and signs involving the nervous system: Secondary | ICD-10-CM | POA: Diagnosis not present

## 2023-03-06 DIAGNOSIS — M5459 Other low back pain: Secondary | ICD-10-CM

## 2023-03-06 DIAGNOSIS — M6281 Muscle weakness (generalized): Secondary | ICD-10-CM

## 2023-03-06 NOTE — Therapy (Unsigned)
[facility] OUTPATIENT PHYSICAL THERAPY THORACOLUMBAR EVALUATION   Patient Name: Brenda Taylor MRN: [id number] DOB:03-07-1971, 52 y.o., female Today's Date: 03/06/2023  END OF SESSION:   PT End of Session - 03/06/23 1522     Visit Number 1    Number of Visits 12    Date for PT Re-Evaluation 04/17/23    Authorization Type [employer]    Authorization Time Period Requested 12 visits    Authorization - Visit Number 0    Authorization - Number of Visits 12    Progress Note Due on Visit --    PT Start Time 1430    PT Stop Time 1515    PT Time Calculation (min) 45 min    Activity Tolerance Patient tolerated treatment well              Past Medical History:  Diagnosis Date   Anxiety    Chronic pain    DDD (degenerative disc disease)    Diabetes mellitus without complication (HCC)    GERD (gastroesophageal reflux disease)    HBP (high blood pressure)    Headache    Hernia    Multiple gastric ulcers    Pre-diabetes    Rotator cuff syndrome of left shoulder 10/09/2011   Ulcer disease    Past Surgical History:  Procedure Laterality Date   APPENDECTOMY     BACK SURGERY     x5   BIOPSY  05/04/2021   Procedure: BIOPSY;  Surgeon: Lanelle Bal, DO;  [facility];  Service: Endoscopy;;   BREAST SURGERY     CARPAL TUNNEL RELEASE     bilateral   CHOLECYSTECTOMY     COLONOSCOPY  10/20/2009   YQM:VHQIONG anal canal/left-side diverticula and multiple polyps (hyperplastic). poor prep compromised exam. Next TCS 09/2014.   COLONOSCOPY WITH PROPOFOL N/A 05/04/2021   Procedure: COLONOSCOPY WITH PROPOFOL;  Surgeon: Lanelle Bal, DO;  [facility];  Service: Endoscopy;  Laterality: N/A;  9:45am   ESOPHAGOGASTRODUODENOSCOPY  03/06/2010   EXB:MWUX quadrant distal esophageal/patent tubular esophagus/small HH, antral erosions and ulcerations. Bx negative for persistent H.pylori   ESOPHAGOGASTRODUODENOSCOPY  10/20/2009   LKG:MWNUU HH/prepyloric antral ulcer s/p bx  (+h.pylori), ERE   ESOPHAGOGASTRODUODENOSCOPY (EGD) WITH PROPOFOL N/A 05/04/2021   Procedure: ESOPHAGOGASTRODUODENOSCOPY (EGD) WITH PROPOFOL;  Surgeon: Lanelle Bal, DO;  [facility];  Service: Endoscopy;  Laterality: N/A;   INTRAUTERINE DEVICE INSERTION     LEEP N/A 02/14/2021   Procedure: LOOP ELECTROSURGICAL EXCISION PROCEDURE (LEEP);  Surgeon: Myna Hidalgo, DO;  Location: [facility];  Service: Gynecology;  Laterality: N/A;   MASTECTOMY, PARTIAL Left    x 2   MOUTH SURGERY     POLYPECTOMY  05/04/2021   Procedure: POLYPECTOMY;  Surgeon: Lanelle Bal, DO;  [facility];  Service: Endoscopy;;   ROTATOR CUFF REPAIR     right   TUBAL LIGATION     Patient Active Problem List   Diagnosis Date Noted   DDD (degenerative disc disease), lumbar 02/07/2023   Hospital discharge follow-up 02/07/2023   Syncope 01/27/2023   Central cord syndrome (HCC) 01/27/2023   Allergic conjunctivitis of both eyes 01/02/2023   Meralgia paresthetica of left side 01/01/2023   Type 2 diabetes mellitus with hyperlipidemia (HCC) 11/14/2022   Plantar wart of both feet 11/13/2022   Simple chronic bronchitis (HCC) 11/13/2022   Acute sinusitis 09/13/2022   Sciatica 04/30/2022   Primary osteoarthritis of hands, bilateral 03/06/2022   Gastroesophageal  reflux disease without esophagitis 03/06/2022   Restless legs syndrome (RLS) 07/18/2021   Intertrigo 05/30/2021   Early satiety 04/10/2021   Encounter for general adult medical examination with abnormal findings 12/15/2020   Mixed hyperlipidemia 12/15/2020   Vitamin D deficiency 09/15/2020   Osteoarthritis of right foot 07/05/2020   Right hand weakness 06/28/2020   History of fusion of cervical spine 06/28/2020   Arthritis 06/01/2020   Diabetic neuropathy (HCC) 06/01/2020   Hearing problem of right ear 05/02/2020   Obesity (BMI 30-39.9) 05/02/2020   Tobacco abuse 05/02/2020   Prediabetes 05/02/2020   GAD (generalized anxiety  disorder) 05/07/2019   Hypertension 04/23/2019   HNP (herniated nucleus pulposus), lumbar 07/03/2013   Chronic idiopathic constipation 10/31/2012    PCP: Anabel Halon, MDRef Provider (PCP)   REFERRING PROVIDER:   Kermit Balo, RN    REFERRING DIAG: [phone] (ICD-10-CM) - Central cord syndrome, initial encounter [facility])   Rationale for Evaluation and Treatment: Rehabilitation  THERAPY DIAG:  Difficulty in walking, not elsewhere classified  Muscle weakness (generalized)  Other low back pain  ONSET DATE: 01/26/23 --------------------------------------------------------------------------------------------- SUBJECTIVE:                                                                                                                                                                                           SUBJECTIVE STATEMENT: On 01/26/23, patient was sitting outside and fell forward onto her head. Patient woke up in hospital and was admitted in the hospital until 7/11. Patient was discharged to home on 7/11 with the [employer]. Patient had MD follow up with MD 2 weeks ago and had collar discontinued. Patient has been out of work since May 2024; return to work date TBD.    PERTINENT HISTORY:  4 Back surgeries( C6C7 Fusion, Lumbar Surgeries), HTN, DM II  PAIN:  Are you having pain? Yes: NPRS scale: 3/10 Pain location: left LE  Pain description: tingling  Aggravating factors: walking Relieving factors: no relief from medicine   PRECAUTIONS: None  RED FLAGS: None   WEIGHT BEARING RESTRICTIONS: No  FALLS:  Has patient fallen in last 6 months? Yes. Number of falls 1  LIVING ENVIRONMENT: Lives with: lives with their daughter Lives in: House/apartment Stairs: No Has following equipment at home: None *uses Rollator at home sometimes   OCCUPATION: [employer]; currently out of work   PLOF: Independent  PATIENT GOALS: to return to work   NEXT MD VISIT: September  2024 --------------------------------------------------------------------------------------------- OBJECTIVE:   DIAGNOSTIC FINDINGS:  1. Soft tissue injury with prevertebral effusion and posterior strain at C2-C5. No occult fracture or major ligamentous disruption is seen. 2. Mild  cord edema rightward at the level of C3-4 where there is a disc protrusion flattening the ventral cord. 3. Additional disc protrusions with cord contact at C2-3 and C4-5. 4. Uncomplicated C6-7 ACDF.  PATIENT SURVEYS:  LEFS Lower Extremity Functional Score: 23 / 80 = 28.7 %  SCREENING FOR RED FLAGS: Bowel or bladder incontinence: No Spinal tumors: No Cauda equina syndrome: No Compression fracture: No Abdominal aneurysm: No  COGNITION: Overall cognitive status: Within functional limits for tasks assessed  POSTURE: decreased lumbar lordosis, increased thoracic kyphosis, and flexed trunk       FUNCTIONAL TESTS:  2 minute walk test: 265 ft   GAIT ANALYSIS: Distance walked: 243ft Assistive device utilized: None Level of assistance: SBA Comments: Patient with MOD antalgia; began to limp after 137ft   SENSATION: Light touch: WFL   LUMBAR ROM:   AROM eval  Flexion To knee  Extension 5*  Right lateral flexion Mid thigh  Left lateral flexion Mid thigh  Right rotation   Left rotation    (Blank rows = not tested; * = limited by pain)  LOWER EXTREMITY MMT:    MMT Right eval Left eval  Hip flexion 4/5 3-/5  Hip extension    Hip abduction 4/5 3-/5  Hip adduction    Hip internal rotation    Hip external rotation    Knee flexion 4/5 3-/5  Knee extension 4/5 3-/5  Ankle dorsiflexion 4/5 3-/5  Ankle plantarflexion    Ankle inversion    Ankle eversion     (Blank rows = not tested)   LUMBAR SPECIAL TESTS:  Slump test: Positive on Left  PALPATION: MOD tenderness to palpation to left hip, lower extremity   --------------------------------------------------------------------------------------------- TODAY'S TREATMENT:                                                                                                                              DATE:  03/06/23  Evaluation: MOD Complexity  -see below    PATIENT EDUCATION:  Education details: Pt educated on activity modification, pain management  Person educated: Patient Education method: Medical illustrator Education comprehension: verbalized understanding  HOME EXERCISE PROGRAM: TBD next visit  --------------------------------------------------------------------------------------------- ASSESSMENT:  CLINICAL IMPRESSION: Patient is a 52 y.o. y.o. female who was seen today for physical therapy evaluation and treatment for difficulty walking, low back pain, weakness. Patient presents to PT with the following objective impairments: decreased endurance, difficulty walking, and decreased strength. These impairments limit the patient in activities such as carrying, lifting, bending, sitting, standing, squatting, sleeping, stairs, transfers, and bathing. These impairments also limit the patient in participation such as meal prep, cleaning, laundry, driving, shopping, community activity, occupation, and yard work. The patient will benefit from PT to address the limitations/impairments listed below to return to their prior level of function in the domains of activity and participation.    PERSONAL FACTORS: 1-2 comorbidities: low back surgeries, DM II, HTN  are also affecting patient's functional outcome.  REHAB POTENTIAL: Good  CLINICAL DECISION MAKING: Stable/uncomplicated  EVALUATION COMPLEXITY: Moderate  --------------------------------------------------------------------------------------------- GOALS: Goals reviewed with patient? Yes  SHORT TERM GOALS: Target date: 04/03/2023    Patient will be able to walk 300 ft for the 2  Minute Walk Test with no assistance to improve ADL completion, negotiate stairs, and walk community distances Baseline: Goal status: INITIAL  2.  Patient will score a >/= 30 on the  LEFS  to demonstrate an improvement in ADL completion, stair negotiation, household/community ambulation, and self-care Baseline:  Goal status: INITIAL  3. Patient will be independent with a basic stretching/strengthening HEP  Baseline:  Goal status: INITIAL   LONG TERM GOALS: Target date: 05/01/2023    Patient will be able to walk 400 ftfor the 2 Minute Walk Test with no assistance to improve ADL completion, negotiate stairs, and walk community distances Baseline:  Goal status: INITIAL  2.  Patient will score a >/=45 on the  LEFS  to demonstrate an improvement in ADL completion, stair negotiation, household/community ambulation, and self-care Baseline:  Goal status: INITIAL  3.  Patient will be independent with a comprehensive strengthening HEP  Baseline:  Goal status: INITIAL  --------------------------------------------------------------------------------------------- PLAN:  PT FREQUENCY: 2x/week  PT DURATION: 6 weeks  PLANNED INTERVENTIONS: Therapeutic exercises, Therapeutic activity, Neuromuscular re-education, Balance training, Gait training, Patient/Family education, Self Care, and Joint mobilization.  PLAN FOR NEXT SESSION: Begin basic lower extremity strength HEP focusing on left lower extremity weakness    Seymour Bars, PT 03/06/2023, 3:53 PM

## 2023-03-08 ENCOUNTER — Ambulatory Visit (HOSPITAL_COMMUNITY): Payer: Medicaid Other | Admitting: Occupational Therapy

## 2023-03-08 ENCOUNTER — Encounter (HOSPITAL_COMMUNITY): Payer: Self-pay | Admitting: Occupational Therapy

## 2023-03-08 DIAGNOSIS — R278 Other lack of coordination: Secondary | ICD-10-CM

## 2023-03-08 DIAGNOSIS — R29818 Other symptoms and signs involving the nervous system: Secondary | ICD-10-CM | POA: Diagnosis not present

## 2023-03-08 NOTE — Patient Instructions (Signed)
Repeat all exercises 10-15 times, 1-2 times per day.  1) Shoulder Protraction    Begin with elbows by your side, slowly "punch" straight out in front of you.      2) Shoulder Flexion      Standing:       Begin with arms at your side with thumbs pointed up, slowly raise both arms up and forward towards overhead.       Standing:           Begin with arms straight out in front of you, bring out to the side in at "T" shape. Keep arms straight entire time.                  5) Shoulder Abduction  Supine:         . Slowly move your arms out to the side so that they go overhead, in a jumping jack or snow angel movement.

## 2023-03-08 NOTE — Therapy (Signed)
OUTPATIENT OCCUPATIONAL THERAPY NEURO TREATMENT  Patient Name: Brenda Taylor MRN: [id number] DOB:1970/09/17, 52 y.o., female Today's Date: 03/08/2023    END OF SESSION:  OT End of Session - 03/08/23 1529     Visit Number 2    Number of Visits 8    Date for OT Re-Evaluation 03/28/23    Authorization Type [place] Medicaid    Authorization Time Period 8/6-10/4    Authorization - Visit Number 1    Authorization - Number of Visits 8    OT Start Time 1430    OT Stop Time 1515    OT Time Calculation (min) 45 min    Activity Tolerance Patient tolerated treatment well    Behavior During Therapy WFL for tasks assessed/performed              Past Medical History:  Diagnosis Date   Anxiety    Chronic pain    DDD (degenerative disc disease)    Diabetes mellitus without complication (HCC)    GERD (gastroesophageal reflux disease)    HBP (high blood pressure)    Headache    Hernia    Multiple gastric ulcers    Pre-diabetes    Rotator cuff syndrome of left shoulder 10/09/2011   Ulcer disease    Past Surgical History:  Procedure Laterality Date   APPENDECTOMY     BACK SURGERY     x5   BIOPSY  05/04/2021   Procedure: BIOPSY;  Surgeon: Lanelle Bal, DO;  [facility];  Service: Endoscopy;;   BREAST SURGERY     CARPAL TUNNEL RELEASE     bilateral   CHOLECYSTECTOMY     COLONOSCOPY  10/20/2009   VHQ:IONGEXB anal canal/left-side diverticula and multiple polyps (hyperplastic). poor prep compromised exam. Next TCS 09/2014.   COLONOSCOPY WITH PROPOFOL N/A 05/04/2021   Procedure: COLONOSCOPY WITH PROPOFOL;  Surgeon: Lanelle Bal, DO;  [facility];  Service: Endoscopy;  Laterality: N/A;  9:45am   ESOPHAGOGASTRODUODENOSCOPY  03/06/2010   MWU:XLKG quadrant distal esophageal/patent tubular esophagus/small HH, antral erosions and ulcerations. Bx negative for persistent H.pylori   ESOPHAGOGASTRODUODENOSCOPY  10/20/2009   MWN:UUVOZ HH/prepyloric antral ulcer  s/p bx (+h.pylori), ERE   ESOPHAGOGASTRODUODENOSCOPY (EGD) WITH PROPOFOL N/A 05/04/2021   Procedure: ESOPHAGOGASTRODUODENOSCOPY (EGD) WITH PROPOFOL;  Surgeon: Lanelle Bal, DO;  [facility];  Service: Endoscopy;  Laterality: N/A;   INTRAUTERINE DEVICE INSERTION     LEEP N/A 02/14/2021   Procedure: LOOP ELECTROSURGICAL EXCISION PROCEDURE (LEEP);  Surgeon: Myna Hidalgo, DO;  Location: [facility];  Service: Gynecology;  Laterality: N/A;   MASTECTOMY, PARTIAL Left    x 2   MOUTH SURGERY     POLYPECTOMY  05/04/2021   Procedure: POLYPECTOMY;  Surgeon: Lanelle Bal, DO;  [facility];  Service: Endoscopy;;   ROTATOR CUFF REPAIR     right   TUBAL LIGATION     Patient Active Problem List   Diagnosis Date Noted   DDD (degenerative disc disease), lumbar 02/07/2023   Hospital discharge follow-up 02/07/2023   Syncope 01/27/2023   Central cord syndrome (HCC) 01/27/2023   Allergic conjunctivitis of both eyes 01/02/2023   Meralgia paresthetica of left side 01/01/2023   Type 2 diabetes mellitus with hyperlipidemia (HCC) 11/14/2022   Plantar wart of both feet 11/13/2022   Simple chronic bronchitis (HCC) 11/13/2022   Acute sinusitis 09/13/2022   Sciatica 04/30/2022   Primary osteoarthritis of hands, bilateral 03/06/2022   Gastroesophageal reflux disease without  esophagitis 03/06/2022   Restless legs syndrome (RLS) 07/18/2021   Intertrigo 05/30/2021   Early satiety 04/10/2021   Encounter for general adult medical examination with abnormal findings 12/15/2020   Mixed hyperlipidemia 12/15/2020   Vitamin D deficiency 09/15/2020   Osteoarthritis of right foot 07/05/2020   Right hand weakness 06/28/2020   History of fusion of cervical spine 06/28/2020   Arthritis 06/01/2020   Diabetic neuropathy (HCC) 06/01/2020   Hearing problem of right ear 05/02/2020   Obesity (BMI 30-39.9) 05/02/2020   Tobacco abuse 05/02/2020   Prediabetes 05/02/2020   GAD (generalized anxiety  disorder) 05/07/2019   Hypertension 04/23/2019   HNP (herniated nucleus pulposus), lumbar 07/03/2013   Chronic idiopathic constipation 10/31/2012   PCP: Dr. Trena Platt REFERRING PROVIDER: Candelaria Stagers, MD  ONSET DATE: 01/27/23  REFERRING DIAG: Central cord syndrome  THERAPY DIAG:  Other symptoms and signs involving the nervous system  Other lack of coordination  Rationale for Evaluation and Treatment: Rehabilitation  SUBJECTIVE:   SUBJECTIVE STATEMENT: S: I am not feeling it today.  Pt accompanied by: self  PERTINENT HISTORY: Pt is a 52 y/o female s/p fall on 01/27/23 secondary to syncopal episode. MRI positive for central cord syndrome, no surgical intervention required. Pt was discharged on 01/31/23. Pt presents reporting nerve pain, weakness in BUE L>R.   PRECAUTIONS: Fall  WEIGHT BEARING RESTRICTIONS: No  PAIN:  Are you having pain? Yes: NPRS scale: 6/10 Pain location: generalized Pain description: aching and sore Aggravating factors: weather Relieving factors: rest  FALLS: Has patient fallen in last 6 months? Yes. Number of falls 1  PLOF: Independent  PATIENT GOALS: To get stronger and be able to do more.   OBJECTIVE:   HAND DOMINANCE: Right  ADLs: Overall ADLs: Pt reports gripping and pinching using the left hand. Delayed motor planning and coordination in the left hand requiring increased time to complete tasks. Pt reports difficulty with housework, reaching overhead and behind back. Limited strength impacting all ADLs.   FUNCTIONAL OUTCOME MEASURES: Quick Dash: 38.64  UPPER EXTREMITY ROM:    Active ROM Right eval Left eval  Shoulder flexion 95 80  Shoulder abduction 90 75  Shoulder internal rotation 90 90  Shoulder external rotation 75 30  (Blank rows = not tested)  UPPER EXTREMITY MMT:     MMT Right eval Left eval  Shoulder flexion 3-/5 3-/5  Shoulder abduction 3-/5 3-/5  Shoulder internal rotation 3/5 3/5  Shoulder external rotation 3/5  3-/5  Elbow flexion 5/5 4+/5  Elbow extension 5/5 4/5  Wrist flexion 5/5 4/5  Wrist extension 5/5 4/5  Wrist ulnar deviation 4+/5 4+/5  Wrist radial deviation 5/5 5/5  Wrist pronation 5/5 4/5  Wrist supination 5/5 4/5  (Blank rows = not tested)  HAND FUNCTION: Grip strength: Right: 38 lbs; Left: 5 lbs, Lateral pinch: Right: 8 lbs, Left: 3 lbs, and 3 point pinch: Right: 5 lbs, Left: 2 lbs  COORDINATION: 9 Hole Peg test: Right: 25.58 sec; Left: 34.92 sec  SENSATION: Tingling, numbness in hands and feet  COGNITION: Overall cognitive status: Within functional limits for tasks assessed   TODAY'S TREATMENT:  DATE:  03/08/2023 -Manual techniques: to BUE trapezius, pectoralis region, and deltoid to improve ROM and decrease pain  -P/ROM: flexion, abduction, internal external rotation 10x to BUE  -AA/ROM: supine,  flexion, abduction, internal external rotation 10x to BUE  - Coordination: stacking cones on elevated surface with L/R hand noted tremor in L hand when completing activity  -Strength: 2lb weighted dowel, seated, chest press, shoulder press, curls 10x each, noted L tremor  PATIENT EDUCATION: Education details: pt had to leave early, give next session; educated on plan for therapy Person educated: Patient Education method: Explanation Education comprehension: verbalized understanding  HOME EXERCISE PROGRAM: 8/16: A/ROM    GOALS: Goals reviewed with patient? Yes  SHORT TERM GOALS: Target date: 03/30/23  Pt will be provided with and educated on HEP to improve mobility and strength in BUE required for ADL completion.   Goal status: INITIAL  2.  Pt will increase BUE strength to 4+/5 or greater, to improve ability to perform caregiving tasks to children.   Goal status: INITIAL  3.  Pt will increase grip strength in bilateral hands by 10# or greater  to improve ability to grasp and hold items during cooking or housework.   Goal status: INITIAL  4.  Pt will increase left fine motor coordination required for tedious tasks such as fixing hair, by completing 9 hole peg test in under 30".  Goal status: INITIAL  5.  Pt will be educated on AE/DME available to assist with ADLs and improve independence and success with completion.   Goal status: INITIAL  6.  Pt will report pain of 4/10 or less to improve ability to sleep for 2+ consecutive hours.    Goal status: INITIAL    ASSESSMENT:  CLINICAL IMPRESSION: Pt reports feeling pain all over body recently. Initiated gentle stretching and manual techniques to BUE. Completed AA/ROM in supine. Noted L hand tremor in L hand when competing shoulder flexion with PVC pipe. Targeted strengthening with 2lb dowel rod with curls, chest press, and shoulder press. Gave A/ROM shoulder exercises for home.  VC and rest breaks throughout due to L hand tremor with activities.    PERFORMANCE DEFICITS: in functional skills including IADLs, coordination, dexterity, ROM, strength, pain, fascial restrictions, muscle spasms, Fine motor control, Gross motor control, and UE functional use, ADLs  IMPAIRMENTS: are limiting patient from ADLs, IADLs, rest and sleep, and leisure.   CO-MORBIDITIES: may have co-morbidities  that affects occupational performance. Patient will benefit from skilled OT to address above impairments and improve overall function.  MODIFICATION OR ASSISTANCE TO COMPLETE EVALUATION: Min-Moderate modification of tasks or assist with assess necessary to complete an evaluation.  OT OCCUPATIONAL PROFILE AND HISTORY: Detailed assessment: Review of records and additional review of physical, cognitive, psychosocial history related to current functional performance.  CLINICAL DECISION MAKING: LOW - limited treatment options, no task modification necessary  REHAB POTENTIAL: Good  EVALUATION COMPLEXITY:  Low    PLAN:  OT FREQUENCY: 2x/week  OT DURATION: 4 weeks  PLANNED INTERVENTIONS: therapeutic exercise, therapeutic activity, neuromuscular re-education, manual therapy, passive range of motion, splinting, patient/family education, and DME and/or AE instructions  RECOMMENDED OTHER SERVICES: PT  CONSULTED AND AGREED WITH PLAN OF CARE: Patient  PLAN FOR NEXT SESSION: Provide HEP, initiate gentle BUE stretching and A/ROM progressing to strengthening. Grip and pinch tasks, coordination exercises.    Ezra Sites, OTR/L  [phone] 03/08/2023, 3:30 PM

## 2023-03-13 ENCOUNTER — Encounter (HOSPITAL_COMMUNITY): Payer: Medicaid Other | Admitting: Occupational Therapy

## 2023-03-13 ENCOUNTER — Ambulatory Visit (HOSPITAL_COMMUNITY): Payer: Medicaid Other | Admitting: Physical Therapy

## 2023-03-13 DIAGNOSIS — R262 Difficulty in walking, not elsewhere classified: Secondary | ICD-10-CM

## 2023-03-13 DIAGNOSIS — R29818 Other symptoms and signs involving the nervous system: Secondary | ICD-10-CM | POA: Diagnosis not present

## 2023-03-13 DIAGNOSIS — M6281 Muscle weakness (generalized): Secondary | ICD-10-CM

## 2023-03-13 DIAGNOSIS — M5459 Other low back pain: Secondary | ICD-10-CM

## 2023-03-13 NOTE — Therapy (Signed)
[facility] OUTPATIENT PHYSICAL THERAPY TREATMENT   Patient Name: MASHONDA PASLAY MRN: [id number] DOB:[birth year], 52 y.o., female Today's Date: 03/13/2023  END OF SESSION:   PT End of Session - 03/13/23 1523     Visit Number 2    Number of Visits 12    Date for PT Re-Evaluation 04/17/23    Authorization Type [place] Medicaid HB    Authorization Time Period approved 14 visits 8/14-11/2    Authorization - Visit Number 1    Authorization - Number of Visits 14    Progress Note Due on Visit 10    PT Start Time 1520    PT Stop Time 1600    PT Time Calculation (min) 40 min    Activity Tolerance Patient tolerated treatment well    Behavior During Therapy WFL for tasks assessed/performed              Past Medical History:  Diagnosis Date   Anxiety    Chronic pain    DDD (degenerative disc disease)    Diabetes mellitus without complication (HCC)    GERD (gastroesophageal reflux disease)    HBP (high blood pressure)    Headache    Hernia    Multiple gastric ulcers    Pre-diabetes    Rotator cuff syndrome of left shoulder 10/09/2011   Ulcer disease    Past Surgical History:  Procedure Laterality Date   APPENDECTOMY     BACK SURGERY     x5   BIOPSY  05/04/2021   Procedure: BIOPSY;  Surgeon: Lanelle Bal, DO;  [facility];  Service: Endoscopy;;   BREAST SURGERY     CARPAL TUNNEL RELEASE     bilateral   CHOLECYSTECTOMY     COLONOSCOPY  10/20/2009   WUJ:WJXBJYN anal canal/left-side diverticula and multiple polyps (hyperplastic). poor prep compromised exam. Next TCS 09/2014.   COLONOSCOPY WITH PROPOFOL N/A 05/04/2021   Procedure: COLONOSCOPY WITH PROPOFOL;  Surgeon: Lanelle Bal, DO;  [facility];  Service: Endoscopy;  Laterality: N/A;  9:45am   ESOPHAGOGASTRODUODENOSCOPY  03/06/2010   WGN:FAOZ quadrant distal esophageal/patent tubular esophagus/small HH, antral erosions and ulcerations. Bx negative for persistent H.pylori   ESOPHAGOGASTRODUODENOSCOPY   10/20/2009   HYQ:MVHQI HH/prepyloric antral ulcer s/p bx (+h.pylori), ERE   ESOPHAGOGASTRODUODENOSCOPY (EGD) WITH PROPOFOL N/A 05/04/2021   Procedure: ESOPHAGOGASTRODUODENOSCOPY (EGD) WITH PROPOFOL;  Surgeon: Lanelle Bal, DO;  [facility];  Service: Endoscopy;  Laterality: N/A;   INTRAUTERINE DEVICE INSERTION     LEEP N/A 02/14/2021   Procedure: LOOP ELECTROSURGICAL EXCISION PROCEDURE (LEEP);  Surgeon: Myna Hidalgo, DO;  Location: [facility];  Service: Gynecology;  Laterality: N/A;   MASTECTOMY, PARTIAL Left    x 2   MOUTH SURGERY     POLYPECTOMY  05/04/2021   Procedure: POLYPECTOMY;  Surgeon: Lanelle Bal, DO;  [facility];  Service: Endoscopy;;   ROTATOR CUFF REPAIR     right   TUBAL LIGATION     Patient Active Problem List   Diagnosis Date Noted   DDD (degenerative disc disease), lumbar 02/07/2023   Hospital discharge follow-up 02/07/2023   Syncope 01/27/2023   Central cord syndrome (HCC) 01/27/2023   Allergic conjunctivitis of both eyes 01/02/2023   Meralgia paresthetica of left side 01/01/2023   Type 2 diabetes mellitus with hyperlipidemia (HCC) 11/14/2022   Plantar wart of both feet 11/13/2022   Simple chronic bronchitis (HCC) 11/13/2022   Acute sinusitis 09/13/2022   Sciatica 04/30/2022  Primary osteoarthritis of hands, bilateral 03/06/2022   Gastroesophageal reflux disease without esophagitis 03/06/2022   Restless legs syndrome (RLS) 07/18/2021   Intertrigo 05/30/2021   Early satiety 04/10/2021   Encounter for general adult medical examination with abnormal findings 12/15/2020   Mixed hyperlipidemia 12/15/2020   Vitamin D deficiency 09/15/2020   Osteoarthritis of right foot 07/05/2020   Right hand weakness 06/28/2020   History of fusion of cervical spine 06/28/2020   Arthritis 06/01/2020   Diabetic neuropathy (HCC) 06/01/2020   Hearing problem of right ear 05/02/2020   Obesity (BMI 30-39.9) 05/02/2020   Tobacco abuse 05/02/2020    Prediabetes 05/02/2020   GAD (generalized anxiety disorder) 05/07/2019   Hypertension 04/23/2019   HNP (herniated nucleus pulposus), lumbar 07/03/2013   Chronic idiopathic constipation 10/31/2012    PCP: Anabel Halon, MDRef Provider (PCP)   REFERRING PROVIDER:   Candelaria Stagers, MD    REFERRING DIAG: S14.129A (ICD-10-CM) - Central cord syndrome, initial encounter [facility])   Rationale for Evaluation and Treatment: Rehabilitation  THERAPY DIAG:  Difficulty in walking, not elsewhere classified  Muscle weakness (generalized)  Other low back pain  ONSET DATE: 01/26/23 --------------------------------------------------------------------------------------------- SUBJECTIVE:                                                                                                                                                                                           SUBJECTIVE STATEMENT: Pt states her Lt leg "gives me a fit" burning and tingling.  Currently 6/10 pain mostly in her neck, bil shoulders (Lt worse than Rt). 2/10 in LT LE  Evaluation: On 01/26/23, patient was sitting outside and fell forward onto her head. Patient woke up in hospital and was admitted in the hospital until 7/11. Patient was discharged to home on 7/11 with the [employer]. Patient had MD follow up with MD 2 weeks ago and had collar discontinued. Patient has been out of work since May 2024; return to work date TBD.    PERTINENT HISTORY:  4 Back surgeries( C6C7 Fusion, Lumbar Surgeries), HTN, DM II  PAIN:  Are you having pain? Yes: NPRS scale: 2/10 Pain location: left LE  Pain description: tingling  Aggravating factors: walking Relieving factors: no relief from medicine   PRECAUTIONS: None  RED FLAGS: None   WEIGHT BEARING RESTRICTIONS: No  FALLS:  Has patient fallen in last 6 months? Yes. Number of falls 1  LIVING ENVIRONMENT: Lives with: lives with their daughter Lives in: House/apartment Stairs: No Has  following equipment at home: None *uses Rollator at home sometimes   OCCUPATION: [employer]; currently out of work   PLOF: Independent  PATIENT GOALS: to  return to work  8/21:  to improve her Lt side strength  NEXT MD VISIT: September 2024 --------------------------------------------------------------------------------------------- OBJECTIVE:   DIAGNOSTIC FINDINGS:  1. Soft tissue injury with prevertebral effusion and posterior strain at C2-C5. No occult fracture or major ligamentous disruption is seen. 2. Mild cord edema rightward at the level of C3-4 where there is a disc protrusion flattening the ventral cord. 3. Additional disc protrusions with cord contact at C2-3 and C4-5. 4. Uncomplicated C6-7 ACDF.  PATIENT SURVEYS:  LEFS Lower Extremity Functional Score: 23 / 80 = 28.7 %  SCREENING FOR RED FLAGS: Bowel or bladder incontinence: No Spinal tumors: No Cauda equina syndrome: No Compression fracture: No Abdominal aneurysm: No  COGNITION: Overall cognitive status: Within functional limits for tasks assessed  POSTURE: decreased lumbar lordosis, increased thoracic kyphosis, and flexed trunk       FUNCTIONAL TESTS:  2 minute walk test: 265 ft   GAIT ANALYSIS: Distance walked: 255ft Assistive device utilized: None Level of assistance: SBA Comments: Patient with MOD antalgia; began to limp after 141ft   SENSATION: Light touch: WFL   LUMBAR ROM:   AROM eval  Flexion To knee  Extension 5*  Right lateral flexion Mid thigh  Left lateral flexion Mid thigh  Right rotation   Left rotation    (Blank rows = not tested; * = limited by pain)  LOWER EXTREMITY MMT:    MMT Right eval Left eval  Hip flexion 4/5 3-/5  Hip extension    Hip abduction 4/5 3-/5  Hip adduction    Hip internal rotation    Hip external rotation    Knee flexion 4/5 3-/5  Knee extension 4/5 3-/5  Ankle dorsiflexion 4/5 3-/5  Ankle plantarflexion    Ankle inversion    Ankle eversion      (Blank rows = not tested)   LUMBAR SPECIAL TESTS:  Slump test: Positive on Left  PALPATION: MOD tenderness to palpation to left hip, lower extremity  --------------------------------------------------------------------------------------------- TODAY'S TREATMENT:                                                                                                                              DATE:  03/13/23 Review of goals and POC moving forward Sit to stands no UE 10X  LAQ 10X5" holds Supine:  bridge 10X  SLR 10X each  Hip abduction with GTB 10X5" each Discussed beginning a walking program, hydration  03/06/23 Evaluation: MOD Complexity  -see below    PATIENT EDUCATION:  Education details: Pt educated on activity modification, pain management  Person educated: Patient Education method: Medical illustrator Education comprehension: verbalized understanding  HOME EXERCISE PROGRAM: Access Code: [email or web address] URL: [email or web address]  Date: 03/13/2023 Prepared by: Emeline Gins Exercises - Supine Bridge  - 2 x daily - 7 x weekly - 1 sets - 10 reps - Small Range Straight Leg Raise  - 2 x daily - 7 x weekly - 1 sets - 10 reps -  Hooklying Clamshell with Resistance  - 2 x daily - 7 x weekly - 1 sets - 10 reps - 5 sec hold - Seated Long Arc Quad  - 2 x daily - 7 x weekly - 1 sets - 10 reps - 5 sec hold - Sit to Stand  - 2 x daily - 7 x weekly - 1 sets - 10 reps   Evaluation: TBD next visit  --------------------------------------------------------------------------------------------- ASSESSMENT:  CLINICAL IMPRESSION: Reviewed goals and POC moving forward.  Pt goal would be to improve her Lt LE/UE strength.  Began bil LE strengthening exercises and added thse to HEP.  Straight leg raise most challenging but able to complete these.  Encouraged pt to being a walking program at home since the weather is turning cooler.  The patient will continue to benefit from PT to  address her deficits.   PERSONAL FACTORS: 1-2 comorbidities: low back surgeries, DM II, HTN  are also affecting patient's functional outcome.   REHAB POTENTIAL: Good  CLINICAL DECISION MAKING: Stable/uncomplicated  EVALUATION COMPLEXITY: Moderate  --------------------------------------------------------------------------------------------- GOALS: Goals reviewed with patient? Yes  SHORT TERM GOALS: Target date: 04/03/2023    Patient will be able to walk 300 ft for the 2 Minute Walk Test with no assistance to improve ADL completion, negotiate stairs, and walk community distances Baseline: Goal status: IN PROGRESS  2.  Patient will score a >/= 30 on the  LEFS  to demonstrate an improvement in ADL completion, stair negotiation, household/community ambulation, and self-care Baseline:  Goal status: IN PROGRESS  3. Patient will be independent with a basic stretching/strengthening HEP  Baseline:  Goal status: IN PROGRESS  LONG TERM GOALS: Target date: 05/01/2023    Patient will be able to walk 400 ftfor the 2 Minute Walk Test with no assistance to improve ADL completion, negotiate stairs, and walk community distances Baseline:  Goal status: IN PROGRESS  2.  Patient will score a >/=45 on the  LEFS  to demonstrate an improvement in ADL completion, stair negotiation, household/community ambulation, and self-care Baseline:  Goal status: IN PROGRESS  3.  Patient will be independent with a comprehensive strengthening HEP  Baseline:  Goal status: IN PROGRESS  --------------------------------------------------------------------------------------------- PLAN:  PT FREQUENCY: 2x/week  PT DURATION: 6 weeks  PLANNED INTERVENTIONS: Therapeutic exercises, Therapeutic activity, Neuromuscular re-education, Balance training, Gait training, Patient/Family education, Self Care, and Joint mobilization.  PLAN FOR NEXT SESSION: Begin basic lower extremity strength HEP focusing on left lower  extremity weakness    Lurena Nida, PTA 03/13/2023, 3:25 PM

## 2023-03-15 ENCOUNTER — Telehealth (HOSPITAL_COMMUNITY): Payer: Self-pay | Admitting: Occupational Therapy

## 2023-03-15 ENCOUNTER — Encounter (HOSPITAL_COMMUNITY): Payer: Medicaid Other | Admitting: Occupational Therapy

## 2023-03-15 NOTE — Telephone Encounter (Signed)
OT called pt regarding her No Show on 03/15/23 at 1:45pm. Pt did not answer, OT left VM informing her of her No Show and reminding her of her next visit on 03/19/23 at 1pm.   Trish Mage, OTR/L [facility] [phone]

## 2023-03-19 ENCOUNTER — Ambulatory Visit (HOSPITAL_COMMUNITY): Payer: Medicaid Other

## 2023-03-19 ENCOUNTER — Other Ambulatory Visit: Payer: Self-pay | Admitting: Internal Medicine

## 2023-03-19 ENCOUNTER — Encounter (HOSPITAL_COMMUNITY): Payer: Self-pay | Admitting: Occupational Therapy

## 2023-03-19 ENCOUNTER — Ambulatory Visit (HOSPITAL_COMMUNITY): Payer: Medicaid Other | Admitting: Occupational Therapy

## 2023-03-19 DIAGNOSIS — E1169 Type 2 diabetes mellitus with other specified complication: Secondary | ICD-10-CM

## 2023-03-19 DIAGNOSIS — R29818 Other symptoms and signs involving the nervous system: Secondary | ICD-10-CM

## 2023-03-19 DIAGNOSIS — R278 Other lack of coordination: Secondary | ICD-10-CM

## 2023-03-19 DIAGNOSIS — M6281 Muscle weakness (generalized): Secondary | ICD-10-CM

## 2023-03-19 NOTE — Therapy (Signed)
[facility] OUTPATIENT PHYSICAL THERAPY TREATMENT   Patient Name: Brenda Taylor MRN: [id number] DOB:11/30/1970, 52 y.o., female Today's Date: 03/19/2023  END OF SESSION:   PT End of Session - 03/19/23 1348     Visit Number 3    Number of Visits 12    Date for PT Re-Evaluation 04/17/23    Authorization Type [employer] Medicaid HB    Authorization Time Period approved 14 visits 8/14-11/2    Authorization - Number of Visits 14    Progress Note Due on Visit 10    Activity Tolerance Patient tolerated treatment well    Behavior During Therapy [facility] for tasks assessed/performed              Past Medical History:  Diagnosis Date   Anxiety    Chronic pain    DDD (degenerative disc disease)    Diabetes mellitus without complication (HCC)    GERD (gastroesophageal reflux disease)    HBP (high blood pressure)    Headache    Hernia    Multiple gastric ulcers    Pre-diabetes    Rotator cuff syndrome of left shoulder 10/09/2011   Ulcer disease    Past Surgical History:  Procedure Laterality Date   APPENDECTOMY     BACK SURGERY     x5   BIOPSY  05/04/2021   Procedure: BIOPSY;  Surgeon: Lanelle Bal, DO;  [facility];  Service: Endoscopy;;   BREAST SURGERY     CARPAL TUNNEL RELEASE     bilateral   CHOLECYSTECTOMY     COLONOSCOPY  10/20/2009   UXL:KGMWNUU anal canal/left-side diverticula and multiple polyps (hyperplastic). poor prep compromised exam. Next TCS 09/2014.   COLONOSCOPY WITH PROPOFOL N/A 05/04/2021   Procedure: COLONOSCOPY WITH PROPOFOL;  Surgeon: Lanelle Bal, DO;  [facility]: [facility];  Service: Endoscopy;  Laterality: N/A;  9:45am   ESOPHAGOGASTRODUODENOSCOPY  03/06/2010   VOZ:DGUY quadrant distal esophageal/patent tubular esophagus/small HH, antral erosions and ulcerations. Bx negative for persistent H.pylori   ESOPHAGOGASTRODUODENOSCOPY  10/20/2009   QIH:KVQQV HH/prepyloric antral ulcer s/p bx (+h.pylori), ERE   ESOPHAGOGASTRODUODENOSCOPY (EGD) WITH  PROPOFOL N/A 05/04/2021   Procedure: ESOPHAGOGASTRODUODENOSCOPY (EGD) WITH PROPOFOL;  Surgeon: Lanelle Bal, DO;  [facility];  Service: Endoscopy;  Laterality: N/A;   INTRAUTERINE DEVICE INSERTION     LEEP N/A 02/14/2021   Procedure: LOOP ELECTROSURGICAL EXCISION PROCEDURE (LEEP);  Surgeon: Myna Hidalgo, DO;  Location: [facility];  Service: Gynecology;  Laterality: N/A;   MASTECTOMY, PARTIAL Left    x 2   MOUTH SURGERY     POLYPECTOMY  05/04/2021   Procedure: POLYPECTOMY;  Surgeon: Lanelle Bal, DO;  [facility];  Service: Endoscopy;;   ROTATOR CUFF REPAIR     right   TUBAL LIGATION     Patient Active Problem List   Diagnosis Date Noted   DDD (degenerative disc disease), lumbar 02/07/2023   Hospital discharge follow-up 02/07/2023   Syncope 01/27/2023   Central cord syndrome (HCC) 01/27/2023   Allergic conjunctivitis of both eyes 01/02/2023   Meralgia paresthetica of left side 01/01/2023   Type 2 diabetes mellitus with hyperlipidemia (HCC) 11/14/2022   Plantar wart of both feet 11/13/2022   Simple chronic bronchitis (HCC) 11/13/2022   Acute sinusitis 09/13/2022   Sciatica 04/30/2022   Primary osteoarthritis of hands, bilateral 03/06/2022   Gastroesophageal reflux disease without esophagitis 03/06/2022   Restless legs syndrome (RLS) 07/18/2021   Intertrigo 05/30/2021   Early satiety 04/10/2021  Encounter for general adult medical examination with abnormal findings 12/15/2020   Mixed hyperlipidemia 12/15/2020   Vitamin D deficiency 09/15/2020   Osteoarthritis of right foot 07/05/2020   Right hand weakness 06/28/2020   History of fusion of cervical spine 06/28/2020   Arthritis 06/01/2020   Diabetic neuropathy (HCC) 06/01/2020   Hearing problem of right ear 05/02/2020   Obesity (BMI 30-39.9) 05/02/2020   Tobacco abuse 05/02/2020   Prediabetes 05/02/2020   GAD (generalized anxiety disorder) 05/07/2019   Hypertension 04/23/2019   HNP (herniated  nucleus pulposus), lumbar 07/03/2013   Chronic idiopathic constipation 10/31/2012    PCP: Anabel Halon, MDRef Provider (PCP)   REFERRING PROVIDER:   Candelaria Stagers, MD    REFERRING DIAG: S14.129A (ICD-10-CM) - Central cord syndrome, initial encounter [facility])   Rationale for Evaluation and Treatment: Rehabilitation  THERAPY DIAG:  Other symptoms and signs involving the nervous system  Other lack of coordination  Muscle weakness (generalized)  Other low back pain  ONSET DATE: 01/26/23 --------------------------------------------------------------------------------------------- SUBJECTIVE:                                                                                                                                                                                           SUBJECTIVE STATEMENT: Patient states that she still feels a limp after walking around 150 ft before the L LE becomes feel weak.  Evaluation: On 01/26/23, patient was sitting outside and fell forward onto her head. Patient woke up in hospital and was admitted in the hospital until 7/11. Patient was discharged to home on 7/11 with the [employer]. Patient had MD follow up with MD 2 weeks ago and had collar discontinued. Patient has been out of work since May 2024; return to work date TBD.    PERTINENT HISTORY:  4 Back surgeries( C6C7 Fusion, Lumbar Surgeries), HTN, DM II  PAIN:  Are you having pain? Yes: NPRS scale: 2/10 Pain location: left LE  Pain description: tingling  Aggravating factors: walking Relieving factors: no relief from medicine   PRECAUTIONS: None  RED FLAGS: None   WEIGHT BEARING RESTRICTIONS: No  FALLS:  Has patient fallen in last 6 months? Yes. Number of falls 1  LIVING ENVIRONMENT: Lives with: lives with their daughter Lives in: House/apartment Stairs: No Has following equipment at home: None *uses Rollator at home sometimes   OCCUPATION: [employer]; currently out of work   PLOF:  Independent  PATIENT GOALS: to return to work  8/21:  to improve her Lt side strength  NEXT MD VISIT: September 2024 --------------------------------------------------------------------------------------------- OBJECTIVE:   DIAGNOSTIC FINDINGS:  1. Soft tissue injury with prevertebral effusion and posterior strain  at C2-C5. No occult fracture or major ligamentous disruption is seen. 2. Mild cord edema rightward at the level of C3-4 where there is a disc protrusion flattening the ventral cord. 3. Additional disc protrusions with cord contact at C2-3 and C4-5. 4. Uncomplicated C6-7 ACDF.  PATIENT SURVEYS:  LEFS Lower Extremity Functional Score: 23 / 80 = 28.7 %  SCREENING FOR RED FLAGS: Bowel or bladder incontinence: No Spinal tumors: No Cauda equina syndrome: No Compression fracture: No Abdominal aneurysm: No  COGNITION: Overall cognitive status: Within functional limits for tasks assessed  POSTURE: decreased lumbar lordosis, increased thoracic kyphosis, and flexed trunk       FUNCTIONAL TESTS:  2 minute walk test: 265 ft   GAIT ANALYSIS: Distance walked: 283ft Assistive device utilized: None Level of assistance: SBA Comments: Patient with MOD antalgia; began to limp after 137ft   SENSATION: Light touch: WFL   LUMBAR ROM:   AROM eval  Flexion To knee  Extension 5*  Right lateral flexion Mid thigh  Left lateral flexion Mid thigh  Right rotation   Left rotation    (Blank rows = not tested; * = limited by pain)  LOWER EXTREMITY MMT:    MMT Right eval Left eval  Hip flexion 4/5 3-/5  Hip extension    Hip abduction 4/5 3-/5  Hip adduction    Hip internal rotation    Hip external rotation    Knee flexion 4/5 3-/5  Knee extension 4/5 3-/5  Ankle dorsiflexion 4/5 3-/5  Ankle plantarflexion    Ankle inversion    Ankle eversion     (Blank rows = not tested)   LUMBAR SPECIAL TESTS:  Slump test: Positive on Left  PALPATION: MOD tenderness to  palpation to left hip, lower extremity  --------------------------------------------------------------------------------------------- TODAY'S TREATMENT:                                                                                                                              DATE:  03/19/23 NuStep level 1, seat 12, arm 11, SPM > 70 x 5' Standing:  Hip abd RTB, 10 x 2  Mini squats, RTB x 10 x 2   03/13/23 Review of goals and POC moving forward Sit to stands no UE 10X  LAQ 10X5" holds Supine:  bridge 10X  SLR 10X each  Hip abduction with GTB 10X5" each Discussed beginning a walking program, hydration  03/06/23 Evaluation: MOD Complexity  -see below    PATIENT EDUCATION:  Education details: Pt educated on activity modification, pain management  Person educated: Patient Education method: Medical illustrator Education comprehension: verbalized understanding  HOME EXERCISE PROGRAM: Access Code: [email or web address] URL: [email or web address]  Date: 03/13/2023 Prepared by: Emeline Gins Exercises - Supine Bridge  - 2 x daily - 7 x weekly - 1 sets - 10 reps - Small Range Straight Leg Raise  - 2 x daily - 7 x weekly - 1 sets - 10 reps - Hooklying Clamshell with  Resistance  - 2 x daily - 7 x weekly - 1 sets - 10 reps - 5 sec hold - Seated Long Arc Quad  - 2 x daily - 7 x weekly - 1 sets - 10 reps - 5 sec hold - Sit to Stand  - 2 x daily - 7 x weekly - 1 sets - 10 reps   Evaluation: TBD next visit  --------------------------------------------------------------------------------------------- ASSESSMENT:  CLINICAL IMPRESSION: Patient tolerated activity with mild tingling on the outer side of the L thigh. Activities today focused on hip abd strengthening and LE endurance. Patient will benefits from continued skilled PT services to address the impairments and functional limitations   PERSONAL FACTORS: 1-2 comorbidities: low back surgeries, DM II, HTN  are also  affecting patient's functional outcome.   REHAB POTENTIAL: Good  CLINICAL DECISION MAKING: Stable/uncomplicated  EVALUATION COMPLEXITY: Moderate  --------------------------------------------------------------------------------------------- GOALS: Goals reviewed with patient? Yes  SHORT TERM GOALS: Target date: 04/03/2023    Patient will be able to walk 300 ft for the 2 Minute Walk Test with no assistance to improve ADL completion, negotiate stairs, and walk community distances Baseline: Goal status: IN PROGRESS  2.  Patient will score a >/= 30 on the  LEFS  to demonstrate an improvement in ADL completion, stair negotiation, household/community ambulation, and self-care Baseline:  Goal status: IN PROGRESS  3. Patient will be independent with a basic stretching/strengthening HEP  Baseline:  Goal status: IN PROGRESS  LONG TERM GOALS: Target date: 05/01/2023    Patient will be able to walk 400 ftfor the 2 Minute Walk Test with no assistance to improve ADL completion, negotiate stairs, and walk community distances Baseline:  Goal status: IN PROGRESS  2.  Patient will score a >/=45 on the  LEFS  to demonstrate an improvement in ADL completion, stair negotiation, household/community ambulation, and self-care Baseline:  Goal status: IN PROGRESS  3.  Patient will be independent with a comprehensive strengthening HEP  Baseline:  Goal status: IN PROGRESS  --------------------------------------------------------------------------------------------- PLAN:  PT FREQUENCY: 2x/week  PT DURATION: 6 weeks  PLANNED INTERVENTIONS: Therapeutic exercises, Therapeutic activity, Neuromuscular re-education, Balance training, Gait training, Patient/Family education, Self Care, and Joint mobilization.  PLAN FOR NEXT SESSION: Continue with POC and may progress as tolerated with emphasis on lower extremity strength  and providing HEP  2:12 PM, 03/19/23 Feliciano Wynter Small Breiana Stratmann MPT [facility]  [phone]

## 2023-03-19 NOTE — Patient Instructions (Signed)

## 2023-03-19 NOTE — Therapy (Signed)
OUTPATIENT OCCUPATIONAL THERAPY NEURO TREATMENT  Patient Name: Brenda Taylor MRN: [id number] DOB:04-07-1971, 52 y.o., female Today's Date: 03/19/2023    END OF SESSION:  OT End of Session - 03/19/23 1339     Visit Number 3    Number of Visits 8    Date for OT Re-Evaluation 03/28/23    Authorization Type [place] Medicaid    Authorization Time Period 6 visits approved 8/6-10/4    Authorization - Visit Number 2    Authorization - Number of Visits 8    OT Start Time 1310   pt arrived late   OT Stop Time 1344    OT Time Calculation (min) 34 min    Activity Tolerance Patient tolerated treatment well    Behavior During Therapy WFL for tasks assessed/performed               Past Medical History:  Diagnosis Date   Anxiety    Chronic pain    DDD (degenerative disc disease)    Diabetes mellitus without complication (HCC)    GERD (gastroesophageal reflux disease)    HBP (high blood pressure)    Headache    Hernia    Multiple gastric ulcers    Pre-diabetes    Rotator cuff syndrome of left shoulder 10/09/2011   Ulcer disease    Past Surgical History:  Procedure Laterality Date   APPENDECTOMY     BACK SURGERY     x5   BIOPSY  05/04/2021   Procedure: BIOPSY;  Surgeon: Lanelle Bal, DO;  [facility];  Service: Endoscopy;;   BREAST SURGERY     CARPAL TUNNEL RELEASE     bilateral   CHOLECYSTECTOMY     COLONOSCOPY  10/20/2009   LKG:MWNUUVO anal canal/left-side diverticula and multiple polyps (hyperplastic). poor prep compromised exam. Next TCS 09/2014.   COLONOSCOPY WITH PROPOFOL N/A 05/04/2021   Procedure: COLONOSCOPY WITH PROPOFOL;  Surgeon: Lanelle Bal, DO;  [facility];  Service: Endoscopy;  Laterality: N/A;  9:45am   ESOPHAGOGASTRODUODENOSCOPY  03/06/2010   ZDG:UYQI quadrant distal esophageal/patent tubular esophagus/small HH, antral erosions and ulcerations. Bx negative for persistent H.pylori   ESOPHAGOGASTRODUODENOSCOPY  10/20/2009    HKV:QQVZD HH/prepyloric antral ulcer s/p bx (+h.pylori), ERE   ESOPHAGOGASTRODUODENOSCOPY (EGD) WITH PROPOFOL N/A 05/04/2021   Procedure: ESOPHAGOGASTRODUODENOSCOPY (EGD) WITH PROPOFOL;  Surgeon: Lanelle Bal, DO;  [facility];  Service: Endoscopy;  Laterality: N/A;   INTRAUTERINE DEVICE INSERTION     LEEP N/A 02/14/2021   Procedure: LOOP ELECTROSURGICAL EXCISION PROCEDURE (LEEP);  Surgeon: Myna Hidalgo, DO;  Location: [facility];  Service: Gynecology;  Laterality: N/A;   MASTECTOMY, PARTIAL Left    x 2   MOUTH SURGERY     POLYPECTOMY  05/04/2021   Procedure: POLYPECTOMY;  Surgeon: Lanelle Bal, DO;  [facility];  Service: Endoscopy;;   ROTATOR CUFF REPAIR     right   TUBAL LIGATION     Patient Active Problem List   Diagnosis Date Noted   DDD (degenerative disc disease), lumbar 02/07/2023   Hospital discharge follow-up 02/07/2023   Syncope 01/27/2023   Central cord syndrome (HCC) 01/27/2023   Allergic conjunctivitis of both eyes 01/02/2023   Meralgia paresthetica of left side 01/01/2023   Type 2 diabetes mellitus with hyperlipidemia (HCC) 11/14/2022   Plantar wart of both feet 11/13/2022   Simple chronic bronchitis (HCC) 11/13/2022   Acute sinusitis 09/13/2022   Sciatica 04/30/2022   Primary osteoarthritis of hands,  bilateral 03/06/2022   Gastroesophageal reflux disease without esophagitis 03/06/2022   Restless legs syndrome (RLS) 07/18/2021   Intertrigo 05/30/2021   Early satiety 04/10/2021   Encounter for general adult medical examination with abnormal findings 12/15/2020   Mixed hyperlipidemia 12/15/2020   Vitamin D deficiency 09/15/2020   Osteoarthritis of right foot 07/05/2020   Right hand weakness 06/28/2020   History of fusion of cervical spine 06/28/2020   Arthritis 06/01/2020   Diabetic neuropathy (HCC) 06/01/2020   Hearing problem of right ear 05/02/2020   Obesity (BMI 30-39.9) 05/02/2020   Tobacco abuse 05/02/2020   Prediabetes  05/02/2020   GAD (generalized anxiety disorder) 05/07/2019   Hypertension 04/23/2019   HNP (herniated nucleus pulposus), lumbar 07/03/2013   Chronic idiopathic constipation 10/31/2012   PCP: Dr. Trena Platt REFERRING PROVIDER: Candelaria Stagers, MD  ONSET DATE: 01/27/23  REFERRING DIAG: Central cord syndrome  THERAPY DIAG:  Other symptoms and signs involving the nervous system  Other lack of coordination  Rationale for Evaluation and Treatment: Rehabilitation  SUBJECTIVE:   SUBJECTIVE STATEMENT: S: My neck and shoulder are hurting today.   PERTINENT HISTORY: Pt is a 52 y/o female s/p fall on 01/27/23 secondary to syncopal episode. MRI positive for central cord syndrome, no surgical intervention required. Pt was discharged on 01/31/23. Pt presents reporting nerve pain, weakness in BUE L>R.   PRECAUTIONS: Fall  WEIGHT BEARING RESTRICTIONS: No  PAIN:  Are you having pain? Yes: NPRS scale: 5/10 Pain location: generalized Pain description: aching and sore Aggravating factors: weather Relieving factors: rest  FALLS: Has patient fallen in last 6 months? Yes. Number of falls 1  PLOF: Independent  PATIENT GOALS: To get stronger and be able to do more.   OBJECTIVE:   HAND DOMINANCE: Right  ADLs: Overall ADLs: Pt reports gripping and pinching using the left hand. Delayed motor planning and coordination in the left hand requiring increased time to complete tasks. Pt reports difficulty with housework, reaching overhead and behind back. Limited strength impacting all ADLs.   FUNCTIONAL OUTCOME MEASURES: Quick Dash: 38.64  UPPER EXTREMITY ROM:    Active ROM Right eval Left eval  Shoulder flexion 95 80  Shoulder abduction 90 75  Shoulder internal rotation 90 90  Shoulder external rotation 75 30  (Blank rows = not tested)  UPPER EXTREMITY MMT:     MMT Right eval Left eval  Shoulder flexion 3-/5 3-/5  Shoulder abduction 3-/5 3-/5  Shoulder internal rotation 3/5 3/5   Shoulder external rotation 3/5 3-/5  Elbow flexion 5/5 4+/5  Elbow extension 5/5 4/5  Wrist flexion 5/5 4/5  Wrist extension 5/5 4/5  Wrist ulnar deviation 4+/5 4+/5  Wrist radial deviation 5/5 5/5  Wrist pronation 5/5 4/5  Wrist supination 5/5 4/5  (Blank rows = not tested)  HAND FUNCTION: Grip strength: Right: 38 lbs; Left: 5 lbs, Lateral pinch: Right: 8 lbs, Left: 3 lbs, and 3 point pinch: Right: 5 lbs, Left: 2 lbs  COORDINATION: 9 Hole Peg test: Right: 25.58 sec; Left: 34.92 sec  SENSATION: Tingling, numbness in hands and feet   TODAY'S TREATMENT:  DATE:  03/19/23 -Manual techniques: to BUE trapezius, pectoralis region, and deltoid to improve ROM and decrease pain  -P/ROM: flexion, abduction, internal external rotation 5 reps to BUE  -AA/ROM: supine-protraction, flexion, 10 reps -A/ROM: supine-er, 10 reps -Grip strengthening: right-gripper vertical at 38# for large and medium beads, intermittent rest breaks -Cone squeezes: left hand squeezing soft cone 10x  03/08/2023 -Manual techniques: to BUE trapezius, pectoralis region, and deltoid to improve ROM and decrease pain  -P/ROM: flexion, abduction, internal external rotation 10x to BUE  -AA/ROM: supine,  flexion, abduction, internal external rotation 10x to BUE  - Coordination: stacking cones on elevated surface with L/R hand noted tremor in L hand when completing activity  -Strength: 2lb weighted dowel, seated, chest press, shoulder press, curls 10x each, noted L tremor  PATIENT EDUCATION: Education details: gripping a washcloth in left hand Person educated: Patient Education method: Explanation Education comprehension: verbalized understanding  HOME EXERCISE PROGRAM: 8/16: AA/ROM     GOALS: Goals reviewed with patient? Yes  SHORT TERM GOALS: Target date: 03/30/23  Pt will be provided with  and educated on HEP to improve mobility and strength in BUE required for ADL completion.   Goal status: IN PROGRESS  2.  Pt will increase BUE strength to 4+/5 or greater, to improve ability to perform caregiving tasks to children.   Goal status:  IN PROGRESS  3.  Pt will increase grip strength in bilateral hands by 10# or greater to improve ability to grasp and hold items during cooking or housework.   Goal status:  IN PROGRESS  4.  Pt will increase left fine motor coordination required for tedious tasks such as fixing hair, by completing 9 hole peg test in under 30".  Goal status:  IN PROGRESS  5.  Pt will be educated on AE/DME available to assist with ADLs and improve independence and success with completion.   Goal status:  IN PROGRESS  6.  Pt will report pain of 4/10 or less to improve ability to sleep for 2+ consecutive hours.    Goal status:  IN PROGRESS    ASSESSMENT:  CLINICAL IMPRESSION: Pt reports she has been sore, has not done many exercises. Continued with gentle manual techniques to left anterior shoulder and upper trapezius. P/ROM completed, RUE full ROM however LUE limited to approximately 60 degrees due to catching sensation in shoulder region. Downgraded exercises to AA/ROM versus A/ROM to improve success with exercises, towards end of task pt with tremors in left arm. Initiated grip strengthening in right hand, rest breaks intermittently for fatigue. Verbal cuing for form and technique during tasks.   PERFORMANCE DEFICITS: in functional skills including IADLs, coordination, dexterity, ROM, strength, pain, fascial restrictions, muscle spasms, Fine motor control, Gross motor control, and UE functional use, ADLs    PLAN:  OT FREQUENCY: 2x/week  OT DURATION: 4 weeks  PLANNED INTERVENTIONS: therapeutic exercise, therapeutic activity, neuromuscular re-education, manual therapy, passive range of motion, splinting, patient/family education, and DME and/or AE  instructions  RECOMMENDED OTHER SERVICES: PT  CONSULTED AND AGREED WITH PLAN OF CARE: Patient  PLAN FOR NEXT SESSION: Provide HEP, gentle BUE stretching and A/ROM progressing to strengthening. Grip and pinch tasks, coordination exercises.    Ezra Sites, OTR/L  [phone] 03/19/2023, 1:45 PM

## 2023-03-22 ENCOUNTER — Encounter (HOSPITAL_COMMUNITY): Payer: Medicaid Other | Admitting: Occupational Therapy

## 2023-03-22 ENCOUNTER — Encounter (HOSPITAL_COMMUNITY): Payer: Medicaid Other

## 2023-03-26 ENCOUNTER — Encounter (HOSPITAL_COMMUNITY): Payer: Medicaid Other | Admitting: Occupational Therapy

## 2023-03-26 ENCOUNTER — Encounter (HOSPITAL_COMMUNITY): Payer: Medicaid Other

## 2023-03-29 ENCOUNTER — Ambulatory Visit (HOSPITAL_COMMUNITY): Payer: Medicaid Other | Attending: Student

## 2023-03-29 ENCOUNTER — Encounter (HOSPITAL_COMMUNITY): Payer: Medicaid Other | Admitting: Occupational Therapy

## 2023-03-29 DIAGNOSIS — R29818 Other symptoms and signs involving the nervous system: Secondary | ICD-10-CM | POA: Diagnosis present

## 2023-03-29 DIAGNOSIS — R262 Difficulty in walking, not elsewhere classified: Secondary | ICD-10-CM | POA: Diagnosis present

## 2023-03-29 DIAGNOSIS — M6281 Muscle weakness (generalized): Secondary | ICD-10-CM | POA: Insufficient documentation

## 2023-03-29 DIAGNOSIS — R278 Other lack of coordination: Secondary | ICD-10-CM | POA: Insufficient documentation

## 2023-03-29 NOTE — Therapy (Signed)
[facility] OUTPATIENT PHYSICAL THERAPY TREATMENT   Patient Name: Brenda Taylor MRN: [id number] DOB:[birth year], 52 y.o., female Today's Date: 03/29/2023  END OF SESSION:   PT End of Session - 03/29/23 1522     Visit Number 4    Number of Visits 12    Date for PT Re-Evaluation 04/17/23    Authorization Type [place] [employer] HB    Authorization Time Period approved 14 visits 8/14-11/2    Authorization - Visit Number 3    Authorization - Number of Visits 14    Progress Note Due on Visit 10    PT Start Time 1520    PT Stop Time 1600    PT Time Calculation (min) 40 min    Activity Tolerance Patient tolerated treatment well    Behavior During Therapy WFL for tasks assessed/performed               Past Medical History:  Diagnosis Date   Anxiety    Chronic pain    DDD (degenerative disc disease)    Diabetes mellitus without complication (HCC)    GERD (gastroesophageal reflux disease)    HBP (high blood pressure)    Headache    Hernia    Multiple gastric ulcers    Pre-diabetes    Rotator cuff syndrome of left shoulder 10/09/2011   Ulcer disease    Past Surgical History:  Procedure Laterality Date   APPENDECTOMY     BACK SURGERY     x5   BIOPSY  05/04/2021   Procedure: BIOPSY;  Surgeon: Lanelle Bal, DO;  Location: [facility];  Service: Endoscopy;;   BREAST SURGERY     CARPAL TUNNEL RELEASE     bilateral   CHOLECYSTECTOMY     COLONOSCOPY  10/20/2009   IRS:WNIOEVO anal canal/left-side diverticula and multiple polyps (hyperplastic). poor prep compromised exam. Next TCS 09/2014.   COLONOSCOPY WITH PROPOFOL N/A 05/04/2021   Procedure: COLONOSCOPY WITH PROPOFOL;  Surgeon: Lanelle Bal, DO;  Location: [facility];  Service: Endoscopy;  Laterality: N/A;  9:45am   ESOPHAGOGASTRODUODENOSCOPY  03/06/2010   JJK:KXFG quadrant distal esophageal/patent tubular esophagus/small HH, antral erosions and ulcerations. Bx negative for persistent H.pylori   ESOPHAGOGASTRODUODENOSCOPY   10/20/2009   HWE:XHBZJ HH/prepyloric antral ulcer s/p bx (+h.pylori), ERE   ESOPHAGOGASTRODUODENOSCOPY (EGD) WITH PROPOFOL N/A 05/04/2021   Procedure: ESOPHAGOGASTRODUODENOSCOPY (EGD) WITH PROPOFOL;  Surgeon: Lanelle Bal, DO;  Location: [facility];  Service: Endoscopy;  Laterality: N/A;   INTRAUTERINE DEVICE INSERTION     LEEP N/A 02/14/2021   Procedure: LOOP ELECTROSURGICAL EXCISION PROCEDURE (LEEP);  Surgeon: Myna Hidalgo, DO;  Location: [facility];  Service: Gynecology;  Laterality: N/A;   MASTECTOMY, PARTIAL Left    x 2   MOUTH SURGERY     POLYPECTOMY  05/04/2021   Procedure: POLYPECTOMY;  Surgeon: Lanelle Bal, DO;  Location: [facility];  Service: Endoscopy;;   ROTATOR CUFF REPAIR     right   TUBAL LIGATION     Patient Active Problem List   Diagnosis Date Noted   DDD (degenerative disc disease), lumbar 02/07/2023   Hospital discharge follow-up 02/07/2023   Syncope 01/27/2023   Central cord syndrome (HCC) 01/27/2023   Allergic conjunctivitis of both eyes 01/02/2023   Meralgia paresthetica of left side 01/01/2023   Type 2 diabetes mellitus with hyperlipidemia (HCC) 11/14/2022   Plantar wart of both feet 11/13/2022   Simple chronic bronchitis (HCC) 11/13/2022   Acute sinusitis 09/13/2022   Sciatica 04/30/2022  Primary osteoarthritis of hands, bilateral 03/06/2022   Gastroesophageal reflux disease without esophagitis 03/06/2022   Restless legs syndrome (RLS) 07/18/2021   Intertrigo 05/30/2021   Early satiety 04/10/2021   Encounter for general adult medical examination with abnormal findings 12/15/2020   Mixed hyperlipidemia 12/15/2020   Vitamin D deficiency 09/15/2020   Osteoarthritis of right foot 07/05/2020   Right hand weakness 06/28/2020   History of fusion of cervical spine 06/28/2020   Arthritis 06/01/2020   Diabetic neuropathy (HCC) 06/01/2020   Hearing problem of right ear 05/02/2020   Obesity (BMI 30-39.9) 05/02/2020   Tobacco abuse 05/02/2020    Prediabetes 05/02/2020   GAD (generalized anxiety disorder) 05/07/2019   Hypertension 04/23/2019   HNP (herniated nucleus pulposus), lumbar 07/03/2013   Chronic idiopathic constipation 10/31/2012    PCP: Anabel Halon, MDRef Provider (PCP)   REFERRING PROVIDER:   Candelaria Stagers, MD    REFERRING DIAG: S14.129A (ICD-10-CM) - Central cord syndrome, initial encounter [facility])   Rationale for Evaluation and Treatment: Rehabilitation  THERAPY DIAG:  Other symptoms and signs involving the nervous system  Other lack of coordination  Muscle weakness (generalized)  Difficulty in walking, not elsewhere classified  ONSET DATE: 01/26/23 --------------------------------------------------------------------------------------------- SUBJECTIVE:                                                                                                                                                                                           SUBJECTIVE STATEMENT: Patient states that her L shoulder and the L side of the neck is hurting (5/10). Patient thinks it's because she slept late last night. Patient recent falls.  Evaluation: On 01/26/23, patient was sitting outside and fell forward onto her head. Patient woke up in hospital and was admitted in the hospital until 7/11. Patient was discharged to home on 7/11 with the [employer]. Patient had MD follow up with MD 2 weeks ago and had collar discontinued. Patient has been out of work since May 2024; return to work date TBD.    PERTINENT HISTORY:  4 Back surgeries( C6C7 Fusion, Lumbar Surgeries), HTN, DM II  PAIN:  Are you having pain? Yes: NPRS scale: 2/10 Pain location: left LE  Pain description: tingling  Aggravating factors: walking Relieving factors: no relief from medicine   PRECAUTIONS: None  RED FLAGS: None   WEIGHT BEARING RESTRICTIONS: No  FALLS:  Has patient fallen in last 6 months? Yes. Number of falls 1  LIVING ENVIRONMENT: Lives with:  lives with their daughter Lives in: House/apartment Stairs: No Has following equipment at home: None *uses Rollator at home sometimes   OCCUPATION: [employer]; currently out of work  PLOF: Independent  PATIENT GOALS: to return to work  8/21:  to improve her Lt side strength  NEXT MD VISIT: September 2024 --------------------------------------------------------------------------------------------- OBJECTIVE:   DIAGNOSTIC FINDINGS:  1. Soft tissue injury with prevertebral effusion and posterior strain at C2-C5. No occult fracture or major ligamentous disruption is seen. 2. Mild cord edema rightward at the level of C3-4 where there is a disc protrusion flattening the ventral cord. 3. Additional disc protrusions with cord contact at C2-3 and C4-5. 4. Uncomplicated C6-7 ACDF.  PATIENT SURVEYS:  LEFS Lower Extremity Functional Score: 23 / 80 = 28.7 %  SCREENING FOR RED FLAGS: Bowel or bladder incontinence: No Spinal tumors: No Cauda equina syndrome: No Compression fracture: No Abdominal aneurysm: No  COGNITION: Overall cognitive status: Within functional limits for tasks assessed  POSTURE: decreased lumbar lordosis, increased thoracic kyphosis, and flexed trunk       FUNCTIONAL TESTS:  2 minute walk test: 265 ft   GAIT ANALYSIS: Distance walked: 273ft Assistive device utilized: None Level of assistance: SBA Comments: Patient with MOD antalgia; began to limp after 174ft   SENSATION: Light touch: WFL   LUMBAR ROM:   AROM eval  Flexion To knee  Extension 5*  Right lateral flexion Mid thigh  Left lateral flexion Mid thigh  Right rotation   Left rotation    (Blank rows = not tested; * = limited by pain)  LOWER EXTREMITY MMT:    MMT Right eval Left eval  Hip flexion 4/5 3-/5  Hip extension    Hip abduction 4/5 3-/5  Hip adduction    Hip internal rotation    Hip external rotation    Knee flexion 4/5 3-/5  Knee extension 4/5 3-/5  Ankle dorsiflexion 4/5  3-/5  Ankle plantarflexion    Ankle inversion    Ankle eversion     (Blank rows = not tested)   LUMBAR SPECIAL TESTS:  Slump test: Positive on Left  PALPATION: MOD tenderness to palpation to left hip, lower extremity  --------------------------------------------------------------------------------------------- TODAY'S TREATMENT:                                                                                                                              DATE:  03/29/23 NuStep level 2, seat 12, arm 11, SPM > 70 x 5' Seated hamstring stretch x 30" x 3 Standing:  Gastrocnemius slant board stretch x 30" x 3   Forward step ups x 2" box x 10 on each  Mini squats, RTB x 10 x 2  R TKE, RTB x 3" x 10 x 2  03/19/23 NuStep level 1, seat 12, arm 11, SPM > 70 x 5' Standing:  Hip abd RTB, 10 x 2  Mini squats, RTB x 10 x 2   03/13/23 Review of goals and POC moving forward Sit to stands no UE 10X  LAQ 10X5" holds Supine:  bridge 10X  SLR 10X each  Hip abduction with GTB 10X5" each  Discussed beginning a walking program, hydration  03/06/23 Evaluation: MOD Complexity  -see below    PATIENT EDUCATION:  Education details: Pt educated on activity modification, pain management  Person educated: Patient Education method: Medical illustrator Education comprehension: verbalized understanding  HOME EXERCISE PROGRAM: Access Code: [email or web address] URL: [email or web address] 03/29/23 - Seated Hamstring Stretch  - 1-2 x daily - 7 x weekly - 3 reps - 30 hold  Date: 03/13/2023 Prepared by: Emeline Gins Exercises - Supine Bridge  - 2 x daily - 7 x weekly - 1 sets - 10 reps - Small Range Straight Leg Raise  - 2 x daily - 7 x weekly - 1 sets - 10 reps - Hooklying Clamshell with Resistance  - 2 x daily - 7 x weekly - 1 sets - 10 reps - 5 sec hold - Seated Long Arc Quad  - 2 x daily - 7 x weekly - 1 sets - 10 reps - 5 sec hold - Sit to Stand  - 2 x daily - 7 x weekly - 1 sets - 10  reps   Evaluation: TBD next visit  --------------------------------------------------------------------------------------------- ASSESSMENT:  CLINICAL IMPRESSION: Interventions today were geared towards LE functional strengthening and flexibility. Demonstrated appropriate mild levels of fatigue. Frequent rest periods provided. Provided mild amount of verbal cueing to ensure correct execution of activity with fair carry-over. Mod difficulty noted with step ups due to weakness. To date, skilled PT is required to address the impairments and improve function.    PERSONAL FACTORS: 1-2 comorbidities: low back surgeries, DM II, HTN  are also affecting patient's functional outcome.   REHAB POTENTIAL: Good  CLINICAL DECISION MAKING: Stable/uncomplicated  EVALUATION COMPLEXITY: Moderate  --------------------------------------------------------------------------------------------- GOALS: Goals reviewed with patient? Yes  SHORT TERM GOALS: Target date: 04/03/2023    Patient will be able to walk 300 ft for the 2 Minute Walk Test with no assistance to improve ADL completion, negotiate stairs, and walk community distances Baseline: Goal status: IN PROGRESS  2.  Patient will score a >/= 30 on the  LEFS  to demonstrate an improvement in ADL completion, stair negotiation, household/community ambulation, and self-care Baseline:  Goal status: IN PROGRESS  3. Patient will be independent with a basic stretching/strengthening HEP  Baseline:  Goal status: IN PROGRESS  LONG TERM GOALS: Target date: 05/01/2023    Patient will be able to walk 400 ftfor the 2 Minute Walk Test with no assistance to improve ADL completion, negotiate stairs, and walk community distances Baseline:  Goal status: IN PROGRESS  2.  Patient will score a >/=45 on the  LEFS  to demonstrate an improvement in ADL completion, stair negotiation, household/community ambulation, and self-care Baseline:  Goal status: IN  PROGRESS  3.  Patient will be independent with a comprehensive strengthening HEP  Baseline:  Goal status: IN PROGRESS  --------------------------------------------------------------------------------------------- PLAN:  PT FREQUENCY: 2x/week  PT DURATION: 6 weeks  PLANNED INTERVENTIONS: Therapeutic exercises, Therapeutic activity, Neuromuscular re-education, Balance training, Gait training, Patient/Family education, Self Care, and Joint mobilization.  PLAN FOR NEXT SESSION: Continue with POC and may progress as tolerated with emphasis on lower extremity strength  and providing HEP   Sammantha Mehlhaff L. Qusai Kem, PT, DPT, OCS Board-Certified Clinical Specialist in Orthopedic PT PT Compact Privilege # ([place]): [phone] T 3:24 PM, 03/29/23

## 2023-04-02 ENCOUNTER — Ambulatory Visit (HOSPITAL_COMMUNITY): Payer: Medicaid Other | Admitting: Physical Therapy

## 2023-04-02 DIAGNOSIS — M6281 Muscle weakness (generalized): Secondary | ICD-10-CM

## 2023-04-02 DIAGNOSIS — R29818 Other symptoms and signs involving the nervous system: Secondary | ICD-10-CM | POA: Diagnosis not present

## 2023-04-02 DIAGNOSIS — R278 Other lack of coordination: Secondary | ICD-10-CM

## 2023-04-02 NOTE — Therapy (Signed)
[facility] OUTPATIENT PHYSICAL THERAPY TREATMENT   Patient Name: Brenda Taylor b MRN: [id number] DOB:Dec 09, 1970, 52 y.o., female Today's Date: 04/02/2023  END OF SESSION:   PT End of Session - 04/02/23 1603     Visit Number 5    Number of Visits 12    Date for PT Re-Evaluation 04/17/23    Authorization Type [place] [employer] HB    Authorization Time Period approved 14 visits 8/14-11/2    Authorization - Number of Visits 14    Progress Note Due on Visit 10    PT Start Time 1602    PT Stop Time 1650    PT Time Calculation (min) 48 min    Equipment Utilized During Treatment Gait belt    Activity Tolerance Patient tolerated treatment well    Behavior During Therapy WFL for tasks assessed/performed               Past Medical History:  Diagnosis Date   Anxiety    Chronic pain    DDD (degenerative disc disease)    Diabetes mellitus without complication (HCC)    GERD (gastroesophageal reflux disease)    HBP (high blood pressure)    Headache    Hernia    Multiple gastric ulcers    Pre-diabetes    Rotator cuff syndrome of left shoulder 10/09/2011   Ulcer disease    Past Surgical History:  Procedure Laterality Date   APPENDECTOMY     BACK SURGERY     x5   BIOPSY  05/04/2021   Procedure: BIOPSY;  Surgeon: Lanelle Bal, DO;  Location: [facility];  Service: Endoscopy;;   BREAST SURGERY     CARPAL TUNNEL RELEASE     bilateral   CHOLECYSTECTOMY     COLONOSCOPY  10/20/2009   KGM:WNUUVOZ anal canal/left-side diverticula and multiple polyps (hyperplastic). poor prep compromised exam. Next TCS 09/2014.   COLONOSCOPY WITH PROPOFOL N/A 05/04/2021   Procedure: COLONOSCOPY WITH PROPOFOL;  Surgeon: Lanelle Bal, DO;  Location: [facility];  Service: Endoscopy;  Laterality: N/A;  9:45am   ESOPHAGOGASTRODUODENOSCOPY  03/06/2010   DGU:YQIH quadrant distal esophageal/patent tubular esophagus/small HH, antral erosions and ulcerations. Bx negative for persistent H.pylori    ESOPHAGOGASTRODUODENOSCOPY  10/20/2009   KVQ:QVZDG HH/prepyloric antral ulcer s/p bx (+h.pylori), ERE   ESOPHAGOGASTRODUODENOSCOPY (EGD) WITH PROPOFOL N/A 05/04/2021   Procedure: ESOPHAGOGASTRODUODENOSCOPY (EGD) WITH PROPOFOL;  Surgeon: Lanelle Bal, DO;  Location: [facility];  Service: Endoscopy;  Laterality: N/A;   INTRAUTERINE DEVICE INSERTION     LEEP N/A 02/14/2021   Procedure: LOOP ELECTROSURGICAL EXCISION PROCEDURE (LEEP);  Surgeon: Myna Hidalgo, DO;  Location: [facility];  Service: Gynecology;  Laterality: N/A;   MASTECTOMY, PARTIAL Left    x 2   MOUTH SURGERY     POLYPECTOMY  05/04/2021   Procedure: POLYPECTOMY;  Surgeon: Lanelle Bal, DO;  Location: [facility];  Service: Endoscopy;;   ROTATOR CUFF REPAIR     right   TUBAL LIGATION     Patient Active Problem List   Diagnosis Date Noted   DDD (degenerative disc disease), lumbar 02/07/2023   Hospital discharge follow-up 02/07/2023   Syncope 01/27/2023   Central cord syndrome (HCC) 01/27/2023   Allergic conjunctivitis of both eyes 01/02/2023   Meralgia paresthetica of left side 01/01/2023   Type 2 diabetes mellitus with hyperlipidemia (HCC) 11/14/2022   Plantar wart of both feet 11/13/2022   Simple chronic bronchitis (HCC) 11/13/2022   Acute sinusitis 09/13/2022  Sciatica 04/30/2022   Primary osteoarthritis of hands, bilateral 03/06/2022   Gastroesophageal reflux disease without esophagitis 03/06/2022   Restless legs syndrome (RLS) 07/18/2021   Intertrigo 05/30/2021   Early satiety 04/10/2021   Encounter for general adult medical examination with abnormal findings 12/15/2020   Mixed hyperlipidemia 12/15/2020   Vitamin D deficiency 09/15/2020   Osteoarthritis of right foot 07/05/2020   Right hand weakness 06/28/2020   History of fusion of cervical spine 06/28/2020   Arthritis 06/01/2020   Diabetic neuropathy (HCC) 06/01/2020   Hearing problem of right ear 05/02/2020   Obesity (BMI 30-39.9) 05/02/2020    Tobacco abuse 05/02/2020   Prediabetes 05/02/2020   GAD (generalized anxiety disorder) 05/07/2019   Hypertension 04/23/2019   HNP (herniated nucleus pulposus), lumbar 07/03/2013   Chronic idiopathic constipation 10/31/2012    PCP: Anabel Halon, MDRef Provider (PCP)   REFERRING PROVIDER:   Candelaria Stagers, MD    REFERRING DIAG: S14.129A (ICD-10-CM) - Central cord syndrome, initial encounter [facility])   Rationale for Evaluation and Treatment: Rehabilitation  THERAPY DIAG:  Other symptoms and signs involving the nervous system  Muscle weakness (generalized)  Other lack of coordination  ONSET DATE: 01/26/23 --------------------------------------------------------------------------------------------- SUBJECTIVE:                                                                                                                                                                                           SUBJECTIVE STATEMENT: Patient states soreness in Lt hip and up into Lt shoulder. States she returns to work tomorrow, only doing 4 hours, 8-12.    Evaluation: On 01/26/23, patient was sitting outside and fell forward onto her head. Patient woke up in hospital and was admitted in the hospital until 7/11. Patient was discharged to home on 7/11 with the [employer]. Patient had MD follow up with MD 2 weeks ago and had collar discontinued. Patient has been out of work since May 2024; return to work date TBD.    PERTINENT HISTORY:  4 Back surgeries( C6C7 Fusion, Lumbar Surgeries), HTN, DM II  PAIN:  Are you having pain? Yes: NPRS scale: 2/10 Pain location: left LE  Pain description: tingling  Aggravating factors: walking Relieving factors: no relief from medicine   PRECAUTIONS: None  RED FLAGS: None   WEIGHT BEARING RESTRICTIONS: No  FALLS:  Has patient fallen in last 6 months? Yes. Number of falls 1  LIVING ENVIRONMENT: Lives with: lives with their daughter Lives in:  House/apartment Stairs: No Has following equipment at home: None *uses Rollator at home sometimes   OCCUPATION: [employer]; currently out of work   PLOF: Independent  PATIENT GOALS: to  return to work  8/21:  to improve her Lt side strength  NEXT MD VISIT: September 2024 --------------------------------------------------------------------------------------------- OBJECTIVE:   DIAGNOSTIC FINDINGS:  1. Soft tissue injury with prevertebral effusion and posterior strain at C2-C5. No occult fracture or major ligamentous disruption is seen. 2. Mild cord edema rightward at the level of C3-4 where there is a disc protrusion flattening the ventral cord. 3. Additional disc protrusions with cord contact at C2-3 and C4-5. 4. Uncomplicated C6-7 ACDF.  PATIENT SURVEYS:  LEFS Lower Extremity Functional Score: 23 / 80 = 28.7 %  SCREENING FOR RED FLAGS: Bowel or bladder incontinence: No Spinal tumors: No Cauda equina syndrome: No Compression fracture: No Abdominal aneurysm: No  COGNITION: Overall cognitive status: Within functional limits for tasks assessed  POSTURE: decreased lumbar lordosis, increased thoracic kyphosis, and flexed trunk       FUNCTIONAL TESTS:  2 minute walk test: 265 ft   GAIT ANALYSIS: Distance walked: 237ft Assistive device utilized: None Level of assistance: SBA Comments: Patient with MOD antalgia; began to limp after 177ft   SENSATION: Light touch: WFL   LUMBAR ROM:   AROM eval  Flexion To knee  Extension 5*  Right lateral flexion Mid thigh  Left lateral flexion Mid thigh  Right rotation   Left rotation    (Blank rows = not tested; * = limited by pain)  LOWER EXTREMITY MMT:    MMT Right eval Left eval  Hip flexion 4/5 3-/5  Hip extension    Hip abduction 4/5 3-/5  Hip adduction    Hip internal rotation    Hip external rotation    Knee flexion 4/5 3-/5  Knee extension 4/5 3-/5  Ankle dorsiflexion 4/5 3-/5  Ankle plantarflexion     Ankle inversion    Ankle eversion     (Blank rows = not tested)   LUMBAR SPECIAL TESTS:  Slump test: Positive on Left  PALPATION: MOD tenderness to palpation to left hip, lower extremity  --------------------------------------------------------------------------------------------- TODAY'S TREATMENT:                                                                                                                              DATE:  04/02/23 NuStep level 2, seat 10, arm 8, SPM > 70 x 5' Standing:  Gastrocnemius slant board stretch x 30" x 3   Forward step ups 4" box 10X on each bil UE assist  Lateral step ups 4" box 10X on each bil UE assist  Forward lunges onto 4" box no UE 10X each  Mini squats 10 x 2   03/29/23 NuStep level 2, seat 12, arm 11, SPM > 70 x 5' Seated hamstring stretch x 30" x 3 Standing:  Gastrocnemius slant board stretch x 30" x 3   Forward step ups x 2" box x 10 on each  Mini squats, RTB x 10 x 2  R TKE, RTB x 3" x 10 x 2  03/19/23 NuStep level 1,  seat 12, arm 11, SPM > 70 x 5' Standing:  Hip abd RTB, 10 x 2  Mini squats, RTB x 10 x 2   03/13/23 Review of goals and POC moving forward Sit to stands no UE 10X  LAQ 10X5" holds Supine:  bridge 10X  SLR 10X each  Hip abduction with GTB 10X5" each Discussed beginning a walking program, hydration  03/06/23 Evaluation: MOD Complexity  -see below    PATIENT EDUCATION:  Education details: Pt educated on activity modification, pain management  Person educated: Patient Education method: Medical illustrator Education comprehension: verbalized understanding  HOME EXERCISE PROGRAM: Access Code: [email or web address] URL: [email or web address] 03/29/23 - Seated Hamstring Stretch  - 1-2 x daily - 7 x weekly - 3 reps - 30 hold  Date: 03/13/2023 Prepared by: Emeline Gins Exercises - Supine Bridge  - 2 x daily - 7 x weekly - 1 sets - 10 reps - Small Range Straight Leg Raise  - 2 x daily - 7 x weekly  - 1 sets - 10 reps - Hooklying Clamshell with Resistance  - 2 x daily - 7 x weekly - 1 sets - 10 reps - 5 sec hold - Seated Long Arc Quad  - 2 x daily - 7 x weekly - 1 sets - 10 reps - 5 sec hold - Sit to Stand  - 2 x daily - 7 x weekly - 1 sets - 10 reps   Evaluation: TBD next visit  --------------------------------------------------------------------------------------------- ASSESSMENT:  CLINICAL IMPRESSION: Continued with focus on improving LE functional strengthening and flexibility. Began forward lunges with ability to complete without UE assist.  Able to increase forward step ups to 4" also.  Pt verbalized she would not be able to complete, however finished each activity fully without complaints of pain.  Cues required for general form and rest periods provided. Provided mild amount of verbal cueing to ensure correct execution of activity with fair carry-over. Pt will continue to benefit from skilled therapy to address deficits and improve function.  PERSONAL FACTORS: 1-2 comorbidities: low back surgeries, DM II, HTN  are also affecting patient's functional outcome.   REHAB POTENTIAL: Good  CLINICAL DECISION MAKING: Stable/uncomplicated  EVALUATION COMPLEXITY: Moderate  --------------------------------------------------------------------------------------------- GOALS: Goals reviewed with patient? Yes  SHORT TERM GOALS: Target date: 04/03/2023    Patient will be able to walk 300 ft for the 2 Minute Walk Test with no assistance to improve ADL completion, negotiate stairs, and walk community distances Baseline: Goal status: IN PROGRESS  2.  Patient will score a >/= 30 on the  LEFS  to demonstrate an improvement in ADL completion, stair negotiation, household/community ambulation, and self-care Baseline:  Goal status: IN PROGRESS  3. Patient will be independent with a basic stretching/strengthening HEP  Baseline:  Goal status: IN PROGRESS  LONG TERM GOALS: Target date:  05/01/2023    Patient will be able to walk 400 ftfor the 2 Minute Walk Test with no assistance to improve ADL completion, negotiate stairs, and walk community distances Baseline:  Goal status: IN PROGRESS  2.  Patient will score a >/=45 on the  LEFS  to demonstrate an improvement in ADL completion, stair negotiation, household/community ambulation, and self-care Baseline:  Goal status: IN PROGRESS  3.  Patient will be independent with a comprehensive strengthening HEP  Baseline:  Goal status: IN PROGRESS  --------------------------------------------------------------------------------------------- PLAN:  PT FREQUENCY: 2x/week  PT DURATION: 6 weeks  PLANNED INTERVENTIONS: Therapeutic exercises, Therapeutic activity, Neuromuscular re-education, Balance training, Gait training, Patient/Family education,  Self Care, and Joint mobilization.  PLAN FOR NEXT SESSION: Continue with POC and may progress as tolerated with emphasis on lower extremity strength  and providing HEP   Lurena Nida, PTA/CLT [facility] Ph: [phone]  4:56 PM, 04/02/23

## 2023-04-04 ENCOUNTER — Encounter (HOSPITAL_COMMUNITY): Payer: Medicaid Other

## 2023-04-04 ENCOUNTER — Telehealth (HOSPITAL_COMMUNITY): Payer: Self-pay

## 2023-04-04 NOTE — Telephone Encounter (Signed)
Patient called the clinic today and was able to speak with Brenda Taylor saying that she's sick and needs to cancel her appointment.  [name]. [name], PT, DPT, OCS Board-Certified Clinical Specialist in Orthopedic PT PT Compact Privilege # ([place]): [phone] T

## 2023-04-09 ENCOUNTER — Encounter (HOSPITAL_COMMUNITY): Payer: Medicaid Other

## 2023-04-11 ENCOUNTER — Encounter (HOSPITAL_COMMUNITY): Payer: Medicaid Other

## 2023-04-15 ENCOUNTER — Ambulatory Visit: Payer: Medicaid Other | Admitting: Internal Medicine

## 2023-04-18 ENCOUNTER — Other Ambulatory Visit: Payer: Self-pay | Admitting: Internal Medicine

## 2023-04-18 DIAGNOSIS — E1169 Type 2 diabetes mellitus with other specified complication: Secondary | ICD-10-CM

## 2023-04-24 ENCOUNTER — Encounter: Payer: Self-pay | Admitting: Internal Medicine

## 2023-04-24 ENCOUNTER — Ambulatory Visit: Payer: Medicaid Other | Admitting: Internal Medicine

## 2023-04-24 VITALS — BP 119/83 | HR 105 | Ht 66.0 in | Wt 276.2 lb

## 2023-04-24 DIAGNOSIS — I10 Essential (primary) hypertension: Secondary | ICD-10-CM | POA: Diagnosis not present

## 2023-04-24 DIAGNOSIS — E1169 Type 2 diabetes mellitus with other specified complication: Secondary | ICD-10-CM | POA: Diagnosis not present

## 2023-04-24 DIAGNOSIS — Z7984 Long term (current) use of oral hypoglycemic drugs: Secondary | ICD-10-CM

## 2023-04-24 DIAGNOSIS — Z23 Encounter for immunization: Secondary | ICD-10-CM

## 2023-04-24 DIAGNOSIS — E785 Hyperlipidemia, unspecified: Secondary | ICD-10-CM

## 2023-04-24 DIAGNOSIS — Z1231 Encounter for screening mammogram for malignant neoplasm of breast: Secondary | ICD-10-CM

## 2023-04-24 DIAGNOSIS — E782 Mixed hyperlipidemia: Secondary | ICD-10-CM

## 2023-04-24 DIAGNOSIS — R232 Flushing: Secondary | ICD-10-CM

## 2023-04-24 DIAGNOSIS — Z720 Tobacco use: Secondary | ICD-10-CM

## 2023-04-24 DIAGNOSIS — M51362 Other intervertebral disc degeneration, lumbar region with discogenic back pain and lower extremity pain: Secondary | ICD-10-CM

## 2023-04-24 DIAGNOSIS — M4722 Other spondylosis with radiculopathy, cervical region: Secondary | ICD-10-CM | POA: Diagnosis not present

## 2023-04-24 LAB — HEMOGLOBIN A1C: Hemoglobin A1C: 6.2

## 2023-04-24 MED ORDER — OZEMPIC (0.25 OR 0.5 MG/DOSE) 2 MG/3ML ~~LOC~~ SOPN
PEN_INJECTOR | SUBCUTANEOUS | 1 refills | Status: AC
Start: 2023-04-24 — End: 2023-07-17

## 2023-04-24 MED ORDER — GABAPENTIN 300 MG PO CAPS: 600.0000 mg | ORAL_CAPSULE | Freq: Three times a day (TID) | ORAL | 3 refills | Status: DC

## 2023-04-24 NOTE — Assessment & Plan Note (Signed)
Has chronic low back pain, history of lumbar spine surgeries Increased dose of gabapentin to 600 mg 3 times daily Flexeril as needed for muscle spasms Follow-up with neurosurgery

## 2023-04-24 NOTE — Assessment & Plan Note (Signed)
Smokes about 0.5 pack/day  Asked about quitting: confirms that she currently smokes cigarettes. Advise to quit smoking: Educated about QUITTING to reduce the risk of cancer, cardio and cerebrovascular disease. Assess willingness: Unwilling to quit at this time, but is working on cutting back. Assist with counseling and pharmacotherapy: Counseled for 5 minutes and literature provided. Arrange for follow up: follow up in 3 months and continue to offer help. 

## 2023-04-24 NOTE — Progress Notes (Signed)
Established Patient Office Visit  Subjective:  Patient ID: Brenda Taylor, female    DOB: [birth year]  Age: 52 y.o. MRN: [id number]  CC:  Chief Complaint  Patient presents with   Hypertension    Follow up    Menopause    Patient struggling with menopause and hot flashes   Weight Loss    Discuss options    HPI Brenda Taylor is a 52 y.o. female with past medical history of hypertension, anxiety and colonic polyp who presents for f/u of her chronic medical conditions.  HTN: BP is well-controlled. Takes medications regularly. Patient denies headache, dizziness, chest pain, dyspnea or palpitations.  Type II DM: Her HbA1c has improved to 6.2 now.  She takes metformin 500 mg QD.  She reports jitteriness at times after taking metformin.  She has chronic fatigue, but denies polyuria or polyphagia.  She has been taking gabapentin 400 mg TID. She still c/o b/l leg pain, which is worse at nighttime. Her pain gets slightly better with walking. She also reports leg cramps at nighttime, but has been better since increasing dose of gabapentin to 300 mg. Denies any fall or recent injury.  She also has chronic neck pain, radiating to bilateral UE.  She has weakness of the hands as well.  She has seen spine surgery and has been advised for surgery.  Anxiety: Now well-controlled with Xanax and Buspar.  Does not take Xanax daily.  Denies any anhedonia, SI or HI.  Hot flashes: She reports chronic menopausal hot flashes.  She has excessive sweating at her workplace ([employer]).  Due to her current smoking, she is not a candidate for HRT.  She prefers to avoid Paxil for now.  Past Medical History:  Diagnosis Date   Anxiety    Chronic pain    DDD (degenerative disc disease)    Diabetes mellitus without complication (HCC)    GERD (gastroesophageal reflux disease)    HBP (high blood pressure)    Headache    Hernia    Multiple gastric ulcers    Pre-diabetes    Rotator cuff syndrome of left shoulder  10/09/2011   Ulcer disease     Past Surgical History:  Procedure Laterality Date   APPENDECTOMY     BACK SURGERY     x5   BIOPSY  05/04/2021   Procedure: BIOPSY;  Surgeon: Lanelle Bal, DO;  [facility]: [facility];  Service: Endoscopy;;   BREAST SURGERY     CARPAL TUNNEL RELEASE     bilateral   CHOLECYSTECTOMY     COLONOSCOPY  10/20/2009   EXB:MWUXLKG anal canal/left-side diverticula and multiple polyps (hyperplastic). poor prep compromised exam. Next TCS 09/2014.   COLONOSCOPY WITH PROPOFOL N/A 05/04/2021   Procedure: COLONOSCOPY WITH PROPOFOL;  Surgeon: Lanelle Bal, DO;  Location: [facility];  Service: Endoscopy;  Laterality: N/A;  9:45am   ESOPHAGOGASTRODUODENOSCOPY  03/06/2010   MWN:UUVO quadrant distal esophageal/patent tubular esophagus/small HH, antral erosions and ulcerations. Bx negative for persistent H.pylori   ESOPHAGOGASTRODUODENOSCOPY  10/20/2009   ZDG:UYQIH HH/prepyloric antral ulcer s/p bx (+h.pylori), ERE   ESOPHAGOGASTRODUODENOSCOPY (EGD) WITH PROPOFOL N/A 05/04/2021   Procedure: ESOPHAGOGASTRODUODENOSCOPY (EGD) WITH PROPOFOL;  Surgeon: Lanelle Bal, DO;  Location: [facility];  Service: Endoscopy;  Laterality: N/A;   INTRAUTERINE DEVICE INSERTION     LEEP N/A 02/14/2021   Procedure: LOOP ELECTROSURGICAL EXCISION PROCEDURE (LEEP);  Surgeon: Myna Hidalgo, DO;  Location: [facility];  Service: Gynecology;  Laterality: N/A;  MASTECTOMY, PARTIAL Left    x 2   MOUTH SURGERY     POLYPECTOMY  05/04/2021   Procedure: POLYPECTOMY;  Surgeon: Lanelle Bal, DO;  Location: [facility];  Service: Endoscopy;;   ROTATOR CUFF REPAIR     right   TUBAL LIGATION      Family History  Problem Relation Age of Onset   Heart disease Other    Arthritis Other    Cancer Other    Asthma Other    Diabetes Other    Heart disease Mother    Diabetes Mother    Cancer Mother    Non-Hodgkin's lymphoma Mother    Hypertension Mother    Arthritis Mother    Lung  cancer Father    Neuropathy Father    Hypertension Father     Social History   Socioeconomic History   Marital status: Widowed    Spouse name: Not on file   Number of children: Not on file   Years of education: 9   Highest education level: Not on file  Occupational History   Occupation: Conservation officer, nature  Tobacco Use   Smoking status: Every Day    Current packs/day: 0.50    Types: Cigarettes    Passive exposure: Current   Smokeless tobacco: Never  Vaping Use   Vaping status: Never Used  Substance and Sexual Activity   Alcohol use: No   Drug use: Yes    Frequency: 7.0 times per week    Types: Marijuana    Comment: twice daily   Sexual activity: Yes    Birth control/protection: I.U.D.  Other Topics Concern   Not on file  Social History Narrative   Not on file   Social Determinants of Health   Financial Resource Strain: Medium Risk (12/26/2020)   Overall Financial Resource Strain (CARDIA)    Difficulty of Paying Living Expenses: Somewhat hard  Food Insecurity: No Food Insecurity (01/27/2023)   Hunger Vital Sign    Worried About Running Out of Food in the Last Year: Never true    Ran Out of Food in the Last Year: Never true  Transportation Needs: No Transportation Needs (01/27/2023)   PRAPARE - Administrator, Civil Service (Medical): No    Lack of Transportation (Non-Medical): No  Physical Activity: Inactive (12/26/2020)   Exercise Vital Sign    Days of Exercise per Week: 0 days    Minutes of Exercise per Session: 0 min  Stress: Stress Concern Present (12/26/2020)   [employer] of Occupational Health - Occupational Stress Questionnaire    Feeling of Stress : To some extent  Social Connections: Socially Isolated (12/26/2020)   Social Connection and Isolation Panel [NHANES]    Frequency of Communication with Friends and Family: Three times a week    Frequency of Social Gatherings with Friends and Family: Once a week    Attends Religious Services: Never    Automotive engineer or Organizations: Not on file    Attends Banker Meetings: Never    Marital Status: Widowed  Intimate Partner Violence: Not At Risk (01/27/2023)   Humiliation, Afraid, Rape, and Kick questionnaire    Fear of Current or Ex-Partner: No    Emotionally Abused: No    Physically Abused: No    Sexually Abused: No    Outpatient Medications Prior to Visit  Medication Sig Dispense Refill   ALPRAZolam (XANAX) 0.5 MG tablet TAKE ONE TABLET BY MOUTH AT BEDTIME AS NEEDED FOR ANXIETY. (  Patient taking differently: Take 0.5 mg by mouth at bedtime as needed for sleep or anxiety.) 30 tablet 2   benzonatate (TESSALON) 200 MG capsule TAKE 1 CAPSULE BY MOUTH 2 TIMES DAILY AS NEEDED FOR COUGH. 30 capsule 0   busPIRone (BUSPAR) 15 MG tablet Take 1 tablet (15 mg total) by mouth daily. 30 tablet 3   celecoxib (CELEBREX) 200 MG capsule Take 1 capsule (200 mg total) by mouth daily. 30 capsule 3   cyclobenzaprine (FLEXERIL) 10 MG tablet Take 10 mg by mouth as needed for muscle spasms.     hydrochlorothiazide (HYDRODIURIL) 25 MG tablet Take 1 tablet (25 mg total) by mouth daily. 30 tablet 5   metFORMIN (GLUCOPHAGE-XR) 500 MG 24 hr tablet TAKE ONE TABLET BY MOUTH ONCE DAILY WITH BREAKFAST. 30 tablet 0   olopatadine (PATADAY) 0.1 % ophthalmic solution Place 1 drop into both eyes 2 (two) times daily. (Patient not taking: Reported on 01/27/2023) 5 mL 1   oxyCODONE (OXY IR/ROXICODONE) 5 MG immediate release tablet Take 1 tablet (5 mg total) by mouth every 6 (six) hours as needed for moderate pain. 30 tablet 0   pantoprazole (PROTONIX) 40 MG tablet Take 1 tablet (40 mg total) by mouth 2 (two) times daily before a meal. (Patient taking differently: Take 40 mg by mouth daily.) 60 tablet 11   rosuvastatin (CRESTOR) 5 MG tablet Take 1 tablet (5 mg total) by mouth daily. 90 tablet 3   gabapentin (NEURONTIN) 300 MG capsule Take 2 capsules (600 mg total) by mouth 3 (three) times daily. 180 capsule 0   No  facility-administered medications prior to visit.    Allergies  Allergen Reactions   Amoxicillin Hives   Aspirin Other (See Comments)    Cannot take due to ulcers   Nsaids Other (See Comments)    Cannot take due to ulcers     ROS Review of Systems  Constitutional:  Negative for chills and fever.  HENT:  Positive for hearing loss (Right sided). Negative for congestion, postnasal drip, sinus pressure, sinus pain and sore throat.   Eyes:  Negative for pain and discharge.  Respiratory:  Negative for cough and shortness of breath.   Cardiovascular:  Negative for chest pain and palpitations.  Gastrointestinal:  Negative for abdominal pain, diarrhea, nausea and vomiting.  Endocrine: Negative for polydipsia and polyuria.  Genitourinary:  Negative for dysuria and hematuria.  Musculoskeletal:  Positive for arthralgias and neck pain. Negative for neck stiffness.  Skin:  Negative for rash.  Neurological:  Positive for weakness (B/l hands) and numbness. Negative for dizziness, seizures, syncope, speech difficulty and light-headedness.  Psychiatric/Behavioral:  Positive for sleep disturbance. Negative for agitation and behavioral problems. The patient is nervous/anxious.       Objective:    Physical Exam Vitals reviewed.  Constitutional:      General: She is not in acute distress.    Appearance: She is obese. She is not diaphoretic.  HENT:     Head: Normocephalic and atraumatic.     Nose: Nose normal. No congestion.     Mouth/Throat:     Mouth: Mucous membranes are moist.     Pharynx: No posterior oropharyngeal erythema.  Eyes:     General: No scleral icterus.    Extraocular Movements: Extraocular movements intact.  Neck:     Vascular: No carotid bruit.  Cardiovascular:     Rate and Rhythm: Normal rate and regular rhythm.     Pulses: Normal pulses.     Heart  sounds: Normal heart sounds. No murmur heard. Pulmonary:     Breath sounds: Normal breath sounds. No wheezing or rales.   Abdominal:     Palpations: Abdomen is soft.     Tenderness: There is no abdominal tenderness.  Musculoskeletal:        General: Tenderness (MCP joints of index and middle finger of left hand) present.     Cervical back: Neck supple. No tenderness.     Right lower leg: No edema.     Left lower leg: No edema.  Skin:    General: Skin is warm.     Findings: No rash.  Neurological:     General: No focal deficit present.     Mental Status: She is alert and oriented to person, place, and time.     Sensory: No sensory deficit.     Motor: Weakness (LUE - 4/5) present.  Psychiatric:        Mood and Affect: Mood normal.        Behavior: Behavior normal.     BP 119/83 (BP Location: Right Arm, Patient Position: Sitting, Cuff Size: Large)   Pulse (!) 105   Ht 5\' 6"  (1.676 m)   Wt 276 lb 3.2 oz (125.3 kg)   SpO2 92%   BMI 44.58 kg/m  Wt Readings from Last 3 Encounters:  04/24/23 276 lb 3.2 oz (125.3 kg)  02/07/23 275 lb 9.6 oz (125 kg)  01/27/23 273 lb (123.8 kg)    Lab Results  Component Value Date   TSH 2.510 11/13/2022   Lab Results  Component Value Date   WBC 17.3 (H) 01/31/2023   HGB 12.9 01/31/2023   HCT 38.5 01/31/2023   MCV 91.4 01/31/2023   PLT 274 01/31/2023   Lab Results  Component Value Date   NA 134 (L) 01/31/2023   K 4.1 01/31/2023   CO2 24 01/31/2023   GLUCOSE 146 (H) 01/31/2023   BUN 35 (H) 01/31/2023   CREATININE 0.99 01/31/2023   BILITOT 0.5 01/27/2023   ALKPHOS 80 01/27/2023   AST 19 01/27/2023   ALT 20 01/27/2023   PROT 7.6 01/27/2023   ALBUMIN 3.2 (L) 01/30/2023   CALCIUM 9.0 01/31/2023   ANIONGAP 8 01/31/2023   EGFR 60 11/13/2022   Lab Results  Component Value Date   CHOL 155 11/13/2022   Lab Results  Component Value Date   HDL 29 (L) 11/13/2022   Lab Results  Component Value Date   LDLCALC 103 (H) 11/13/2022   Lab Results  Component Value Date   TRIG 125 11/13/2022   Lab Results  Component Value Date   CHOLHDL 5.3 (H)  11/13/2022   Lab Results  Component Value Date   HGBA1C 6.2 04/24/2023      Assessment & Plan:   Problem List Items Addressed This Visit       Cardiovascular and Mediastinum   Hypertension    BP: 119/83   Well-controlled with hydrochlorothiazide Lisinopril recently discontinued - cough improved Advised to stay compliant with medications Advised DASH diet and moderate exercise as tolerated      Hot flashes    Due to her current smoking, she is not a candidate for HRT. She prefers to avoid Paxil for now.        Endocrine   Type 2 diabetes mellitus with hyperlipidemia (HCC) - Primary    Lab Results  Component Value Date   HGBA1C 6.2 04/24/2023   HbA1c was 6.5 in 04/24  Well controlled now  On metformin 500 mg once daily, but has jitteriness due to it (?) Started Ozempic -increase dose as tolerated, can also help with weight loss Advised to follow diabetic diet On statin F/u CMP and lipid panel Diabetic eye exam: Advised to follow up with Ophthalmology for diabetic eye exam      Relevant Medications   Semaglutide,0.25 or 0.5MG /DOS, (OZEMPIC, 0.25 OR 0.5 MG/DOSE,) 2 MG/3ML SOPN   Other Relevant Orders   Bayer DCA Hb A1c Waived   Urine Microalbumin w/creat. ratio     Nervous and Auditory   Cervical spondylosis with radiculopathy    MRI of cervical spine reviewed Has neurosurgery follow-up Has Celebrex as needed for mild to moderate pain Has oxycodone 5 mg as needed for severe pain Increased dose of gabapentin to 600 mg 3 times daily Had PT and OT, but had worsening of her pain Planned for cervical spine surgery - medically optimized with moderate risk      Relevant Medications   gabapentin (NEURONTIN) 300 MG capsule     Musculoskeletal and Integument   DDD (degenerative disc disease), lumbar    Has chronic low back pain, history of lumbar spine surgeries Increased dose of gabapentin to 600 mg 3 times daily Flexeril as needed for muscle spasms Follow-up  with neurosurgery      Relevant Medications   gabapentin (NEURONTIN) 300 MG capsule     Other   Tobacco abuse    Smokes about 0.5 pack/day  Asked about quitting: confirms that she currently smokes cigarettes Advise to quit smoking: Educated about QUITTING to reduce the risk of cancer, cardio and cerebrovascular disease. Assess willingness: Unwilling to quit at this time, but is working on cutting back. Assist with counseling and pharmacotherapy: Counseled for 5 minutes and literature provided. Arrange for follow up: follow up in 3 months and continue to offer help.      Mixed hyperlipidemia    On Crestor Follow DASH diet      Other Visit Diagnoses     Encounter for immunization       Relevant Orders   Flu vaccine trivalent PF, 6mos and older(Flulaval,Afluria,Fluarix,Fluzone) (Completed)   Encounter for screening mammogram for malignant neoplasm of breast       Relevant Orders   MM Digital Screening      Meds ordered this encounter  Medications   gabapentin (NEURONTIN) 300 MG capsule    Sig: Take 2 capsules (600 mg total) by mouth 3 (three) times daily.    Dispense:  180 capsule    Refill:  3   Semaglutide,0.25 or 0.5MG /DOS, (OZEMPIC, 0.25 OR 0.5 MG/DOSE,) 2 MG/3ML SOPN    Sig: Inject 0.25 mg into the skin every 7 (seven) days for 28 days, THEN 0.5 mg every 7 (seven) days.    Dispense:  3 mL    Refill:  1    Follow-up: Return in about 3 months (around 07/25/2023) for Annual physical.    Anabel Halon, MD

## 2023-04-24 NOTE — Assessment & Plan Note (Addendum)
Lab Results  Component Value Date   HGBA1C 6.2 04/24/2023   HbA1c was 6.5 in 04/24  Well controlled now On metformin 500 mg once daily, but has jitteriness due to it (?) Started Ozempic -increase dose as tolerated, can also help with weight loss Advised to follow diabetic diet On statin F/u CMP and lipid panel Diabetic eye exam: Advised to follow up with Ophthalmology for diabetic eye exam

## 2023-04-24 NOTE — Assessment & Plan Note (Signed)
BP: 119/83   Well-controlled with hydrochlorothiazide Lisinopril recently discontinued - cough improved Advised to stay compliant with medications Advised DASH diet and moderate exercise as tolerated

## 2023-04-24 NOTE — Assessment & Plan Note (Addendum)
MRI of cervical spine reviewed Has neurosurgery follow-up Has Celebrex as needed for mild to moderate pain Has oxycodone 5 mg as needed for severe pain Increased dose of gabapentin to 600 mg 3 times daily Had PT and OT, but had worsening of her pain Planned for cervical spine surgery - medically optimized with moderate risk

## 2023-04-24 NOTE — Patient Instructions (Signed)
Please start taking Ozempic as prescribed - 0.25 mg once weekly for 4 weeks and then 0.50 mg once weekly.  Please continue to take other medications as prescribed.  Please continue to follow low carb diet and perform moderate exercise/walking as tolerated.

## 2023-04-24 NOTE — Assessment & Plan Note (Signed)
On Crestor Follow DASH diet

## 2023-04-24 NOTE — Assessment & Plan Note (Signed)
Due to her current smoking, she is not a candidate for HRT. She prefers to avoid Paxil for now.

## 2023-04-25 ENCOUNTER — Encounter (HOSPITAL_COMMUNITY): Payer: Medicaid Other

## 2023-04-26 LAB — MICROALBUMIN / CREATININE URINE RATIO
Creatinine, Urine: 150.9 mg/dL
Microalb/Creat Ratio: 5 mg/g{creat} (ref 0–29)
Microalbumin, Urine: 7.5 ug/mL

## 2023-05-02 ENCOUNTER — Other Ambulatory Visit: Payer: Self-pay | Admitting: Internal Medicine

## 2023-05-02 DIAGNOSIS — F411 Generalized anxiety disorder: Secondary | ICD-10-CM

## 2023-05-15 ENCOUNTER — Other Ambulatory Visit: Payer: Self-pay

## 2023-05-15 DIAGNOSIS — E1169 Type 2 diabetes mellitus with other specified complication: Secondary | ICD-10-CM

## 2023-05-15 MED ORDER — METFORMIN HCL ER 500 MG PO TB24: 500.0000 mg | ORAL_TABLET | Freq: Every day | ORAL | 0 refills | Status: DC

## 2023-06-02 ENCOUNTER — Other Ambulatory Visit: Payer: Self-pay | Admitting: Internal Medicine

## 2023-06-02 DIAGNOSIS — S14129D Central cord syndrome at unspecified level of cervical spinal cord, subsequent encounter: Secondary | ICD-10-CM

## 2023-06-02 DIAGNOSIS — M51369 Other intervertebral disc degeneration, lumbar region without mention of lumbar back pain or lower extremity pain: Secondary | ICD-10-CM

## 2023-06-02 DIAGNOSIS — I10 Essential (primary) hypertension: Secondary | ICD-10-CM

## 2023-06-19 ENCOUNTER — Other Ambulatory Visit: Payer: Self-pay

## 2023-06-19 DIAGNOSIS — E1169 Type 2 diabetes mellitus with other specified complication: Secondary | ICD-10-CM

## 2023-06-19 MED ORDER — METFORMIN HCL ER 500 MG PO TB24: 500.0000 mg | ORAL_TABLET | Freq: Every day | ORAL | 0 refills | Status: DC

## 2023-07-20 ENCOUNTER — Other Ambulatory Visit: Payer: Self-pay | Admitting: Internal Medicine

## 2023-07-20 DIAGNOSIS — E1169 Type 2 diabetes mellitus with other specified complication: Secondary | ICD-10-CM

## 2023-07-22 NOTE — Telephone Encounter (Signed)
PT DUE FOR OV

## 2023-08-02 ENCOUNTER — Other Ambulatory Visit: Payer: Self-pay | Admitting: Internal Medicine

## 2023-08-02 DIAGNOSIS — F419 Anxiety disorder, unspecified: Secondary | ICD-10-CM

## 2023-08-21 ENCOUNTER — Other Ambulatory Visit: Payer: Self-pay | Admitting: Internal Medicine

## 2023-08-21 DIAGNOSIS — E1169 Type 2 diabetes mellitus with other specified complication: Secondary | ICD-10-CM

## 2023-09-18 ENCOUNTER — Other Ambulatory Visit: Payer: Self-pay | Admitting: Internal Medicine

## 2023-09-18 DIAGNOSIS — E1169 Type 2 diabetes mellitus with other specified complication: Secondary | ICD-10-CM

## 2023-10-02 ENCOUNTER — Other Ambulatory Visit: Payer: Self-pay | Admitting: Internal Medicine

## 2023-10-02 DIAGNOSIS — S14129D Central cord syndrome at unspecified level of cervical spinal cord, subsequent encounter: Secondary | ICD-10-CM

## 2023-10-02 DIAGNOSIS — M51369 Other intervertebral disc degeneration, lumbar region without mention of lumbar back pain or lower extremity pain: Secondary | ICD-10-CM

## 2023-10-21 ENCOUNTER — Ambulatory Visit: Payer: Self-pay | Admitting: Internal Medicine

## 2023-10-21 ENCOUNTER — Other Ambulatory Visit: Payer: Self-pay | Admitting: Internal Medicine

## 2023-10-21 DIAGNOSIS — E1169 Type 2 diabetes mellitus with other specified complication: Secondary | ICD-10-CM

## 2023-10-21 MED ORDER — METFORMIN HCL ER 500 MG PO TB24
500.0000 mg | ORAL_TABLET | Freq: Every day | ORAL | 0 refills | Status: DC
Start: 2023-10-21 — End: 2023-10-22

## 2023-10-21 NOTE — Telephone Encounter (Signed)
 Chief Complaint: metformin is out, and "I sometimes get headaches" Symptoms: "headache starting" Frequency: today Pertinent Negatives: Patient denies CMS changes, vision changes, speech changes,  Disposition: [] ED /[] Urgent Care (no appt availability in office) / [] Appointment(In office/virtual)/ []  [facility]/ [x] Home Care/ [] Refused Recommended Disposition /[] [facility]/ []  Follow-up with PCP Additional Notes: Pt states that she Is calling in because the pharmacy has told her before that they have requested refills on medications but then they have not requested them. Pt states that she has been out of her metformin for about 2 days and this is what happens. Pt states she "feels a headache coming on".  Pt has not taken any OTC medications for the HA. Pt states that she had a fall last summer and believes she is "probably going to need surgery in the next month or so". Pt states that she does not want an appt for her head discomfort, but states she would like one to get her BP and labs checked. Pt scheduled for office visit.   Copied from CRM [id number]. Topic: Clinical - Red Word Triage >> Oct 21, 2023  1:47 PM Nyra Capes wrote: Red Word that prompted transfer to Nurse Triage: patient called in is out of metFORMIN (GLUCOPHAGE-XR) 500 MG 24 hr tablet since 10/19/23 Saturday.  Patient was just a the pharmacy who stated they faxed in in request on Monday 10/21/23 morning  Patient is feeling lethargic, headache is coming, and funny feeling Answer Assessment - Initial Assessment Questions 1. LOCATION: "Where does it hurt?"      Front of head right between eyes 2. ONSET: "When did the headache start?" (Minutes, hours or days)      today 3. PATTERN: "Does the pain come and go, or has it been constant since it started?"     Kind of constant 4. SEVERITY: "How bad is the pain?" and "What does it keep you from doing?"  (e.g., Scale 1-10; mild, moderate, or severe)   - MILD (1-3): doesn't  interfere with normal activities    - MODERATE (4-7): interferes with normal activities or awakens from sleep    - SEVERE (8-10): excruciating pain, unable to do any normal activities        3 5. RECURRENT SYMPTOM: "Have you ever had headaches before?" If Yes, ask: "When was the last time?" and "What happened that time?"      Not since taking metformin 6. CAUSE: "What do you think is causing the headache?"     unsure 7. MIGRAINE: "Have you been diagnosed with migraine headaches?" If Yes, ask: "Is this headache similar?"      denies 8. HEAD INJURY: "Has there been any recent injury to the head?"      Fall last summer, states she was hospitalized and thinks she will need surgery in the next month or so 9. OTHER SYMPTOMS: "Do you have any other symptoms?" (fever, stiff neck, eye pain, sore throat, cold symptoms)     Neck pain, no new, has hurt since fell  Protocols used: Headache-A-AH  Reason for Disposition  [1] Headache AND [2] has not taken pain medications  Answer Assessment - Initial Assessment Questions 1. LOCATION: "Where does it hurt?"      Front of head right between eyes 2. ONSET: "When did the headache start?" (Minutes, hours or days)      today 3. PATTERN: "Does the pain come and go, or has it been constant since it started?"  Kind of constant 4. SEVERITY: "How bad is the pain?" and "What does it keep you from doing?"  (e.g., Scale 1-10; mild, moderate, or severe)   - MILD (1-3): doesn't interfere with normal activities    - MODERATE (4-7): interferes with normal activities or awakens from sleep    - SEVERE (8-10): excruciating pain, unable to do any normal activities        3 5. RECURRENT SYMPTOM: "Have you ever had headaches before?" If Yes, ask: "When was the last time?" and "What happened that time?"      Not since taking metformin 6. CAUSE: "What do you think is causing the headache?"     unsure 7. MIGRAINE: "Have you been diagnosed with migraine headaches?" If  Yes, ask: "Is this headache similar?"      denies 8. HEAD INJURY: "Has there been any recent injury to the head?"      Fall last summer, states she was hospitalized and thinks she will need surgery in the next month or so 9. OTHER SYMPTOMS: "Do you have any other symptoms?" (fever, stiff neck, eye pain, sore throat, cold symptoms)     Neck pain, no new, has hurt since fell  Protocols used: Headache-A-AH

## 2023-10-29 ENCOUNTER — Other Ambulatory Visit: Payer: Self-pay | Admitting: Internal Medicine

## 2023-11-04 ENCOUNTER — Other Ambulatory Visit: Payer: Self-pay | Admitting: Neurosurgery

## 2023-11-04 ENCOUNTER — Telehealth: Payer: Self-pay | Admitting: Internal Medicine

## 2023-11-04 NOTE — Telephone Encounter (Signed)
 Pre op clearance  Noted  Copied Sleeved  Original in PCP box Copy front desk folder

## 2023-11-06 ENCOUNTER — Encounter: Payer: Self-pay | Admitting: Internal Medicine

## 2023-11-06 ENCOUNTER — Other Ambulatory Visit: Payer: Self-pay | Admitting: Internal Medicine

## 2023-11-06 ENCOUNTER — Ambulatory Visit: Admitting: Internal Medicine

## 2023-11-06 VITALS — BP 109/75 | HR 97 | Ht 67.0 in | Wt 274.4 lb

## 2023-11-06 DIAGNOSIS — E1169 Type 2 diabetes mellitus with other specified complication: Secondary | ICD-10-CM

## 2023-11-06 DIAGNOSIS — Z01818 Encounter for other preprocedural examination: Secondary | ICD-10-CM

## 2023-11-06 DIAGNOSIS — Z7984 Long term (current) use of oral hypoglycemic drugs: Secondary | ICD-10-CM

## 2023-11-06 DIAGNOSIS — M4722 Other spondylosis with radiculopathy, cervical region: Secondary | ICD-10-CM

## 2023-11-06 DIAGNOSIS — F411 Generalized anxiety disorder: Secondary | ICD-10-CM

## 2023-11-06 DIAGNOSIS — I10 Essential (primary) hypertension: Secondary | ICD-10-CM

## 2023-11-06 DIAGNOSIS — E1142 Type 2 diabetes mellitus with diabetic polyneuropathy: Secondary | ICD-10-CM | POA: Diagnosis not present

## 2023-11-06 DIAGNOSIS — J410 Simple chronic bronchitis: Secondary | ICD-10-CM

## 2023-11-06 DIAGNOSIS — E782 Mixed hyperlipidemia: Secondary | ICD-10-CM

## 2023-11-06 MED ORDER — BENZONATATE 200 MG PO CAPS: 200.0000 mg | ORAL_CAPSULE | Freq: Two times a day (BID) | ORAL | 1 refills | Status: DC | PRN

## 2023-11-06 NOTE — Patient Instructions (Signed)
 Please continue to take medications as prescribed.  Please continue to follow low carb diet and perform moderate exercise/walking as tolerated.

## 2023-11-06 NOTE — Assessment & Plan Note (Signed)
 MRI of cervical spine reviewed Has neurosurgery follow-up Has Celebrex as needed for mild to moderate pain Has oxycodone 5 mg as needed for severe pain On gabapentin to 400 mg 3 times daily Had PT and OT, but had worsening of her pain Planned for cervical spine surgery - medically optimized with moderate risk

## 2023-11-06 NOTE — Assessment & Plan Note (Signed)
 Lab Results  Component Value Date   HGBA1C 6.2 04/24/2023   Well controlled now On metformin 500 mg once daily, but has jitteriness due to it (?) Advised to follow diabetic diet On statin F/u CMP and lipid panel Diabetic eye exam: Advised to follow up with Ophthalmology for diabetic eye exam

## 2023-11-06 NOTE — Progress Notes (Unsigned)
 Established Patient Office Visit  Subjective:  Patient ID: Brenda Taylor, female    DOB: Apr 06, 1971  Age: 53 y.o. MRN: [id number]  CC:  Chief Complaint  Patient presents with   Pre-op Exam    Pre op exam    HPI Brenda Taylor is a 53 y.o. female with past medical history of hypertension, anxiety and colonic polyp who presents for f/u of her chronic medical conditions.  HTN: BP is well-controlled. Takes medications regularly. Patient denies headache, dizziness, chest pain, dyspnea or palpitations.  Type II DM: Her HbA1c has improved to 6.4 now.  She takes metformin 500 mg QD. She has chronic fatigue, but denies polyuria or polyphagia.  She has been taking gabapentin 400 mg TID. She still c/o b/l leg pain, which is worse at nighttime. Her pain gets slightly better with walking. She also reports leg cramps at nighttime, but has been better since increasing dose of gabapentin to 400 mg. Denies any fall or recent injury.  She also has chronic neck pain, radiating to bilateral UE.  She has weakness of the hands as well.  She has seen spine surgery and has been advised for surgery.  Anxiety: Now well-controlled with Xanax and Buspar.  Does not take Xanax daily.  Denies any anhedonia, SI or HI.   Past Medical History:  Diagnosis Date   Anxiety    Chronic pain    DDD (degenerative disc disease)    Diabetes mellitus without complication (HCC)    GERD (gastroesophageal reflux disease)    HBP (high blood pressure)    Headache    Hernia    Multiple gastric ulcers    Pre-diabetes    Rotator cuff syndrome of left shoulder 10/09/2011   Ulcer disease     Past Surgical History:  Procedure Laterality Date   APPENDECTOMY     BACK SURGERY     x5   BIOPSY  05/04/2021   Procedure: BIOPSY;  Surgeon: Lanelle Bal, DO;  Location: [facility];  Service: Endoscopy;;   BREAST SURGERY     CARPAL TUNNEL RELEASE     bilateral   CHOLECYSTECTOMY     COLONOSCOPY  10/20/2009   JYN:WGNFAOZ anal  canal/left-side diverticula and multiple polyps (hyperplastic). poor prep compromised exam. Next TCS 09/2014.   COLONOSCOPY WITH PROPOFOL N/A 05/04/2021   Procedure: COLONOSCOPY WITH PROPOFOL;  Surgeon: Lanelle Bal, DO;  Location: [facility];  Service: Endoscopy;  Laterality: N/A;  9:45am   ESOPHAGOGASTRODUODENOSCOPY  03/06/2010   HYQ:MVHQ quadrant distal esophageal/patent tubular esophagus/small HH, antral erosions and ulcerations. Bx negative for persistent H.pylori   ESOPHAGOGASTRODUODENOSCOPY  10/20/2009   ION:GEXBM HH/prepyloric antral ulcer s/p bx (+h.pylori), ERE   ESOPHAGOGASTRODUODENOSCOPY (EGD) WITH PROPOFOL N/A 05/04/2021   Procedure: ESOPHAGOGASTRODUODENOSCOPY (EGD) WITH PROPOFOL;  Surgeon: Lanelle Bal, DO;  Location: [facility];  Service: Endoscopy;  Laterality: N/A;   INTRAUTERINE DEVICE INSERTION     LEEP N/A 02/14/2021   Procedure: LOOP ELECTROSURGICAL EXCISION PROCEDURE (LEEP);  Surgeon: Myna Hidalgo, DO;  Location: [facility];  Service: Gynecology;  Laterality: N/A;   MASTECTOMY, PARTIAL Left    x 2   MOUTH SURGERY     POLYPECTOMY  05/04/2021   Procedure: POLYPECTOMY;  Surgeon: Lanelle Bal, DO;  Location: [facility];  Service: Endoscopy;;   ROTATOR CUFF REPAIR     right   TUBAL LIGATION      Family History  Problem Relation Age of Onset   Heart disease  Other    Arthritis Other    Cancer Other    Asthma Other    Diabetes Other    Heart disease Mother    Diabetes Mother    Cancer Mother    Non-Hodgkin's lymphoma Mother    Hypertension Mother    Arthritis Mother    Lung cancer Father    Neuropathy Father    Hypertension Father     Social History   Socioeconomic History   Marital status: Widowed    Spouse name: Not on file   Number of children: Not on file   Years of education: 9   Highest education level: Not on file  Occupational History   Occupation: Conservation officer, nature  Tobacco Use   Smoking status: Every Day    Current packs/day: 0.50     Types: Cigarettes    Passive exposure: Current   Smokeless tobacco: Never   Tobacco comments:    15 cigarettes a day as of 11/06/23  Vaping Use   Vaping status: Never Used  Substance and Sexual Activity   Alcohol use: No   Drug use: Yes    Frequency: 7.0 times per week    Types: Marijuana    Comment: twice daily   Sexual activity: Yes    Birth control/protection: I.U.D.  Other Topics Concern   Not on file  Social History Narrative   Not on file   Social Drivers of Health   Financial Resource Strain: Medium Risk (12/26/2020)   Overall Financial Resource Strain (CARDIA)    Difficulty of Paying Living Expenses: Somewhat hard  Food Insecurity: No Food Insecurity (01/27/2023)   Hunger Vital Sign    Worried About Running Out of Food in the Last Year: Never true    Ran Out of Food in the Last Year: Never true  Transportation Needs: No Transportation Needs (01/27/2023)   PRAPARE - Administrator, Civil Service (Medical): No    Lack of Transportation (Non-Medical): No  Physical Activity: Inactive (12/26/2020)   Exercise Vital Sign    Days of Exercise per Week: 0 days    Minutes of Exercise per Session: 0 min  Stress: Stress Concern Present (12/26/2020)   [employer] of Occupational Health - Occupational Stress Questionnaire    Feeling of Stress : To some extent  Social Connections: Socially Isolated (12/26/2020)   Social Connection and Isolation Panel [NHANES]    Frequency of Communication with Friends and Family: Three times a week    Frequency of Social Gatherings with Friends and Family: Once a week    Attends Religious Services: Never    Database administrator or Organizations: Not on file    Attends Banker Meetings: Never    Marital Status: Widowed  Intimate Partner Violence: Not At Risk (01/27/2023)   Humiliation, Afraid, Rape, and Kick questionnaire    Fear of Current or Ex-Partner: No    Emotionally Abused: No    Physically Abused: No     Sexually Abused: No    Outpatient Medications Prior to Visit  Medication Sig Dispense Refill   ALPRAZolam (XANAX) 0.5 MG tablet TAKE ONE TABLET BY MOUTH AT BEDTIME AS NEEDED FOR ANXIETY. 30 tablet 0   busPIRone (BUSPAR) 15 MG tablet TAKE 1 TABLET BY MOUTH ONCE DAILY. 30 tablet 3   celecoxib (CELEBREX) 200 MG capsule TAKE ONE CAPSULE BY MOUTH ONCE DAILY. 30 capsule 0   cyclobenzaprine (FLEXERIL) 10 MG tablet Take 10 mg by mouth as needed for muscle spasms.  gabapentin (NEURONTIN) 400 MG capsule TAKE ONE CAPSULE BY MOUTH 3 TIMES A DAY 270 capsule 1   hydrochlorothiazide (HYDRODIURIL) 25 MG tablet TAKE 1 TABLET BY MOUTH ONCE A DAY. 30 tablet 5   metFORMIN (GLUCOPHAGE-XR) 500 MG 24 hr tablet TAKE ONE TABLET (=500MG  TOTAL) EVERY DAY WITH BREAKFAST 30 tablet 0   olopatadine (PATADAY) 0.1 % ophthalmic solution Place 1 drop into both eyes 2 (two) times daily. 5 mL 1   oxyCODONE (OXY IR/ROXICODONE) 5 MG immediate release tablet Take 1 tablet (5 mg total) by mouth every 6 (six) hours as needed for moderate pain. 30 tablet 0   pantoprazole (PROTONIX) 40 MG tablet TAKE (1) TABLET BY MOUTH TWICE DAILY BEFORE MEALS. 60 tablet 0   rosuvastatin (CRESTOR) 5 MG tablet Take 1 tablet (5 mg total) by mouth daily. 90 tablet 3   gabapentin (NEURONTIN) 300 MG capsule Take 2 capsules (600 mg total) by mouth 3 (three) times daily. 180 capsule 3   benzonatate (TESSALON) 200 MG capsule TAKE 1 CAPSULE BY MOUTH 2 TIMES DAILY AS NEEDED FOR COUGH. (Patient not taking: Reported on 11/06/2023) 30 capsule 0   No facility-administered medications prior to visit.    Allergies  Allergen Reactions   Amoxicillin Hives   Aspirin Other (See Comments)    Cannot take due to ulcers   Nsaids Other (See Comments)    Cannot take due to ulcers     ROS Review of Systems  Constitutional:  Negative for chills and fever.  HENT:  Positive for hearing loss (Right sided). Negative for congestion, postnasal drip, sinus pressure, sinus  pain and sore throat.   Eyes:  Negative for pain and discharge.  Respiratory:  Negative for cough and shortness of breath.   Cardiovascular:  Negative for chest pain and palpitations.  Gastrointestinal:  Negative for abdominal pain, diarrhea, nausea and vomiting.  Endocrine: Negative for polydipsia and polyuria.  Genitourinary:  Negative for dysuria and hematuria.  Musculoskeletal:  Positive for arthralgias and neck pain. Negative for neck stiffness.  Skin:  Negative for rash.  Neurological:  Positive for weakness (B/l hands) and numbness. Negative for dizziness, seizures, syncope, speech difficulty and light-headedness.  Psychiatric/Behavioral:  Positive for sleep disturbance. Negative for agitation and behavioral problems. The patient is nervous/anxious.       Objective:    Physical Exam Vitals reviewed.  Constitutional:      General: She is not in acute distress.    Appearance: She is obese. She is not diaphoretic.  HENT:     Head: Normocephalic and atraumatic.     Nose: Nose normal. No congestion.     Mouth/Throat:     Mouth: Mucous membranes are moist.     Pharynx: No posterior oropharyngeal erythema.  Eyes:     General: No scleral icterus.    Extraocular Movements: Extraocular movements intact.  Neck:     Vascular: No carotid bruit.  Cardiovascular:     Rate and Rhythm: Normal rate and regular rhythm.     Pulses: Normal pulses.     Heart sounds: Normal heart sounds. No murmur heard. Pulmonary:     Breath sounds: Normal breath sounds. No wheezing or rales.  Abdominal:     Palpations: Abdomen is soft.     Tenderness: There is no abdominal tenderness.  Musculoskeletal:        General: Tenderness (MCP joints of index and middle finger of left hand) present.     Cervical back: Neck supple. Tenderness present.  Right lower leg: No edema.     Left lower leg: No edema.  Skin:    General: Skin is warm.     Findings: No rash.  Neurological:     General: No focal  deficit present.     Mental Status: She is alert and oriented to person, place, and time.     Sensory: No sensory deficit.     Motor: Weakness (LUE - 4/5) present.  Psychiatric:        Mood and Affect: Mood normal.        Behavior: Behavior normal.     BP 109/75   Pulse 97   Ht 5\' 7"  (1.702 m)   Wt 274 lb 6.4 oz (124.5 kg)   SpO2 92%   BMI 42.98 kg/m  Wt Readings from Last 3 Encounters:  11/06/23 274 lb 6.4 oz (124.5 kg)  04/24/23 276 lb 3.2 oz (125.3 kg)  02/07/23 275 lb 9.6 oz (125 kg)    Lab Results  Component Value Date   TSH 2.510 11/13/2022   Lab Results  Component Value Date   WBC 14.0 (H) 11/06/2023   HGB 12.9 11/06/2023   HCT 38.1 11/06/2023   MCV 88 11/06/2023   PLT 296 11/06/2023   Lab Results  Component Value Date   NA 140 11/06/2023   K 3.5 11/06/2023   CO2 23 11/06/2023   GLUCOSE 98 11/06/2023   BUN 22 11/06/2023   CREATININE 0.93 11/06/2023   BILITOT <0.2 11/06/2023   ALKPHOS 96 11/06/2023   AST 17 11/06/2023   ALT 19 11/06/2023   PROT 7.3 11/06/2023   ALBUMIN 4.2 11/06/2023   CALCIUM 9.5 11/06/2023   ANIONGAP 8 01/31/2023   EGFR 73 11/06/2023   Lab Results  Component Value Date   CHOL 155 11/13/2022   Lab Results  Component Value Date   HDL 29 (L) 11/13/2022   Lab Results  Component Value Date   LDLCALC 103 (H) 11/13/2022   Lab Results  Component Value Date   TRIG 125 11/13/2022   Lab Results  Component Value Date   CHOLHDL 5.3 (H) 11/13/2022   Lab Results  Component Value Date   HGBA1C 6.4 (H) 11/06/2023      Assessment & Plan:   Problem List Items Addressed This Visit       Cardiovascular and Mediastinum   Hypertension - Primary   BP: 109/75   Well-controlled with hydrochlorothiazide Lisinopril recently discontinued - cough improved Advised to stay compliant with medications Advised DASH diet and moderate exercise as tolerated      Relevant Orders   EKG 12-Lead (Completed)     Respiratory   Simple  chronic bronchitis (HCC)   Needs to quit smoking Has chronic cough, takes Tessalon as needed Denies dyspnea or wheezing currently      Relevant Medications   benzonatate (TESSALON) 200 MG capsule     Endocrine   Diabetic neuropathy (HCC)   On gabapentin 400 mg 3 times daily Still has chronic numbness and tingling of the bilateral LE      Type 2 diabetes mellitus with other specified complication (HCC)   Lab Results  Component Value Date   HGBA1C 6.2 04/24/2023   Well controlled now Associated with HTN and HLD On metformin 500 mg once daily Advised to follow diabetic diet On statin F/u CMP and lipid panel Diabetic eye exam: Advised to follow up with Ophthalmology for diabetic eye exam        Nervous and  Auditory   Cervical spondylosis with radiculopathy   MRI of cervical spine reviewed Has neurosurgery follow-up Has Celebrex as needed for mild to moderate pain Has oxycodone 5 mg as needed for severe pain On gabapentin to 400 mg 3 times daily Had PT and OT, but had worsening of her pain Planned for cervical spine surgery - medically optimized with moderate risk        Other   GAD (generalized anxiety disorder) (Chronic)   Well-controlled with BuSpar and Xanax, takes Xanax PRN Advised to avoid marijuana products      Mixed hyperlipidemia (Chronic)   On Crestor Follow DASH diet      Preop examination   EKG: Sinus rhythm. No signs of active ischemia. BP wnl, DM controlled RCRI: 0  Medically optimized for cervical spine surgery with moderate risk (h/o HTN and DM), needs to cut down -> quit smoking for better wound healing      Relevant Orders   EKG 12-Lead (Completed)   Meds ordered this encounter  Medications   benzonatate (TESSALON) 200 MG capsule    Sig: Take 1 capsule (200 mg total) by mouth 2 (two) times daily as needed for cough.    Dispense:  30 capsule    Refill:  1    Follow-up: Return in about 2 months (around 01/06/2024), or if symptoms  worsen or fail to improve.    Meldon Sport, MD

## 2023-11-07 ENCOUNTER — Telehealth: Payer: Self-pay | Admitting: Internal Medicine

## 2023-11-07 LAB — CMP14+EGFR
ALT: 19 IU/L (ref 0–32)
AST: 17 IU/L (ref 0–40)
Albumin: 4.2 g/dL (ref 3.8–4.9)
Alkaline Phosphatase: 96 IU/L (ref 44–121)
BUN/Creatinine Ratio: 24 — ABNORMAL HIGH (ref 9–23)
BUN: 22 mg/dL (ref 6–24)
Bilirubin Total: <0.2 mg/dL (ref 0.0–1.2)
CO2: 23 mmol/L (ref 20–29)
Calcium: 9.5 mg/dL (ref 8.7–10.2)
Chloride: 101 mmol/L (ref 96–106)
Creatinine, Ser: 0.93 mg/dL (ref 0.57–1.00)
Globulin, Total: 3.1 g/dL (ref 1.5–4.5)
Glucose: 98 mg/dL (ref 70–99)
Potassium: 3.5 mmol/L (ref 3.5–5.2)
Sodium: 140 mmol/L (ref 134–144)
Total Protein: 7.3 g/dL (ref 6.0–8.5)
eGFR: 73 mL/min/{1.73_m2} (ref >59)

## 2023-11-07 LAB — CBC WITH DIFFERENTIAL/PLATELET
Basophils Absolute: 0.1 10*3/uL (ref 0.0–0.2)
Basos: 1 %
EOS (ABSOLUTE): 0.3 10*3/uL (ref 0.0–0.4)
Eos: 2 %
Hematocrit: 38.1 % (ref 34.0–46.6)
Hemoglobin: 12.9 g/dL (ref 11.1–15.9)
Immature Grans (Abs): 0 10*3/uL (ref 0.0–0.1)
Immature Granulocytes: 0 %
Lymphocytes Absolute: 4.1 10*3/uL — ABNORMAL HIGH (ref 0.7–3.1)
Lymphs: 29 %
MCH: 29.8 pg (ref 26.6–33.0)
MCHC: 33.9 g/dL (ref 31.5–35.7)
MCV: 88 fL (ref 79–97)
Monocytes Absolute: 1.1 10*3/uL — ABNORMAL HIGH (ref 0.1–0.9)
Monocytes: 8 %
Neutrophils Absolute: 8.3 10*3/uL — ABNORMAL HIGH (ref 1.4–7.0)
Neutrophils: 60 %
Platelets: 296 10*3/uL (ref 150–450)
RBC: 4.33 x10E6/uL (ref 3.77–5.28)
RDW: 13.1 % (ref 11.7–15.4)
WBC: 14 10*3/uL — ABNORMAL HIGH (ref 3.4–10.8)

## 2023-11-07 LAB — HEMOGLOBIN A1C
Est. average glucose Bld gHb Est-mCnc: 137 mg/dL
Hgb A1c MFr Bld: 6.4 % — ABNORMAL HIGH (ref 4.8–5.6)

## 2023-11-07 NOTE — Assessment & Plan Note (Addendum)
 EKG: Sinus rhythm. No signs of active ischemia. BP wnl, DM controlled RCRI: 0  Medically optimized for cervical spine surgery with moderate risk (h/o HTN and DM), needs to cut down -> quit smoking for better wound healing

## 2023-11-07 NOTE — Assessment & Plan Note (Signed)
 On gabapentin 400 mg 3 times daily Still has chronic numbness and tingling of the bilateral LE

## 2023-11-07 NOTE — Telephone Encounter (Signed)
 Copied from CRM [id number]. Topic: Clinical - Lab/Test Results >> Nov 07, 2023 10:22 AM Rennis Case wrote: Reason for CRM: Patient given lab result. No further questions.

## 2023-11-07 NOTE — Assessment & Plan Note (Signed)
 Well-controlled with BuSpar and Xanax, takes Xanax PRN Advised to avoid marijuana products

## 2023-11-07 NOTE — Assessment & Plan Note (Signed)
 BP: 109/75   Well-controlled with hydrochlorothiazide Lisinopril recently discontinued - cough improved Advised to stay compliant with medications Advised DASH diet and moderate exercise as tolerated

## 2023-11-07 NOTE — Assessment & Plan Note (Signed)
On Crestor Follow DASH diet

## 2023-11-07 NOTE — Assessment & Plan Note (Signed)
Needs to quit smoking Has chronic cough, takes Tessalon as needed Denies dyspnea or wheezing currently

## 2023-11-09 ENCOUNTER — Other Ambulatory Visit: Payer: Self-pay

## 2023-11-09 ENCOUNTER — Emergency Department (HOSPITAL_COMMUNITY)
Admission: EM | Admit: 2023-11-09 | Discharge: 2023-11-09 | Disposition: A | Discharge status: Home / Self Care | Attending: Emergency Medicine | Admitting: Emergency Medicine

## 2023-11-09 ENCOUNTER — Encounter (HOSPITAL_COMMUNITY): Payer: Self-pay | Admitting: Emergency Medicine

## 2023-11-09 ENCOUNTER — Emergency Department (HOSPITAL_COMMUNITY)

## 2023-11-09 DIAGNOSIS — E119 Type 2 diabetes mellitus without complications: Secondary | ICD-10-CM | POA: Diagnosis not present

## 2023-11-09 DIAGNOSIS — Z79899 Other long term (current) drug therapy: Secondary | ICD-10-CM | POA: Insufficient documentation

## 2023-11-09 DIAGNOSIS — I10 Essential (primary) hypertension: Secondary | ICD-10-CM | POA: Insufficient documentation

## 2023-11-09 DIAGNOSIS — M5442 Lumbago with sciatica, left side: Secondary | ICD-10-CM | POA: Insufficient documentation

## 2023-11-09 DIAGNOSIS — M545 Low back pain, unspecified: Secondary | ICD-10-CM | POA: Diagnosis present

## 2023-11-09 DIAGNOSIS — M5432 Sciatica, left side: Secondary | ICD-10-CM

## 2023-11-09 DIAGNOSIS — Z7984 Long term (current) use of oral hypoglycemic drugs: Secondary | ICD-10-CM | POA: Insufficient documentation

## 2023-11-09 MED ORDER — HYDROMORPHONE HCL 1 MG/ML IJ SOLN
1.0000 mg | Freq: Once | INTRAMUSCULAR | Status: AC
  Administered 2023-11-09: 1 mg via INTRAVENOUS
  Filled 2023-11-09: qty 1

## 2023-11-09 MED ORDER — ONDANSETRON HCL 4 MG/2ML IJ SOLN
4.0000 mg | Freq: Once | INTRAMUSCULAR | Status: AC
  Administered 2023-11-09: 4 mg via INTRAVENOUS
  Filled 2023-11-09: qty 2

## 2023-11-09 MED ORDER — DEXAMETHASONE SODIUM PHOSPHATE 10 MG/ML IJ SOLN
10.0000 mg | Freq: Once | INTRAMUSCULAR | Status: AC
  Administered 2023-11-09: 10 mg via INTRAVENOUS
  Filled 2023-11-09: qty 1

## 2023-11-09 MED ORDER — PREDNISONE 10 MG PO TABS: ORAL_TABLET | ORAL | 0 refills | Status: DC

## 2023-11-09 MED ORDER — OXYCODONE-ACETAMINOPHEN 5-325 MG PO TABS
2.0000 | ORAL_TABLET | Freq: Once | ORAL | Status: AC
  Administered 2023-11-09: 2 via ORAL
  Filled 2023-11-09: qty 2

## 2023-11-09 MED ORDER — OXYCODONE-ACETAMINOPHEN 7.5-325 MG PO TABS: 1.0000 | ORAL_TABLET | Freq: Four times a day (QID) | ORAL | 0 refills | Status: DC | PRN

## 2023-11-09 NOTE — Discharge Instructions (Addendum)
 You are being prescribed a stronger dose of oxycodone  to help bridge your pain until you are able to have your surgery.  Do not drive within 4 hours of taking this medication as it will make you drowsy.  Use this in place of your current oxycodone  5 mg, do not take both of these medications together.  I am also placing you on a prednisone  taper, keep a close watch on your blood glucose levels while you are on this taper as prednisone  can elevate your blood glucose levels.  Take your next dose of prednisone  tomorrow as you have received today's dose here.

## 2023-11-09 NOTE — ED Provider Notes (Signed)
  EMERGENCY DEPARTMENT AT [facility] Provider Note   CSN: [id number] Arrival date & time: 11/09/23  1147     History  Chief Complaint  Patient presents with   Hip Pain    Brenda Taylor is a 53 y.o. female with history including hypertension, degenerative disc disease, GERD with history of PUD, type 2 diabetes presenting for evaluation of suspected sciatica.  Patient describes she is currently awaiting surgical intervention of her C-spine under the care of Dr. Andy Bannister, describes significant acute on chronic neck pain since a fall which occurred last July.  She is currently taking oxycodone  5 mg tablets for this problem, surgery scheduled for May 13.  In the interim she has been on her feet more with her job at a [employer] and has developed low back and left buttock pain which radiates down her left leg to her heel.  She suspects sciatica which she has had in the past.  She denies weakness or numbness in the extremities, has had no urinary or fecal incontinence or retention, denies fevers or chills.  She currently takes oxycodone  5 mg once daily to help her through her work shift but states this medication really is not controlling her pain currently.  She cannot tolerate NSAIDs given PUD, has had prednisone  in the past with some symptom improvement.  The history is provided by the patient.       Home Medications Prior to Admission medications   Medication Sig Start Date End Date Taking? Authorizing Provider  oxyCODONE -acetaminophen  (PERCOCET) 7.5-325 MG tablet Take 1 tablet by mouth every 6 (six) hours as needed for severe pain (pain score 7-10). 11/09/23  Yes Nora Sabey, PA-C  predniSONE  (DELTASONE ) 10 MG tablet 4, 3, 2 then 1 tablet by mouth daily for 4 days total. 11/09/23  Yes Elva Mauro, PA-C  ALPRAZolam  (XANAX ) 0.5 MG tablet TAKE ONE TABLET BY MOUTH AT BEDTIME AS NEEDED FOR ANXIETY. 08/05/23   Meldon Sport, MD  benzonatate  (TESSALON ) 200 MG  capsule Take 1 capsule (200 mg total) by mouth 2 (two) times daily as needed for cough. 11/06/23   Meldon Sport, MD  busPIRone  (BUSPAR ) 15 MG tablet TAKE 1 TABLET BY MOUTH ONCE DAILY. 05/02/23   Meldon Sport, MD  celecoxib  (CELEBREX ) 200 MG capsule TAKE ONE CAPSULE BY MOUTH ONCE DAILY. 10/02/23   Meldon Sport, MD  cyclobenzaprine  (FLEXERIL ) 10 MG tablet Take 10 mg by mouth as needed for muscle spasms.    [provider]  gabapentin  (NEURONTIN ) 400 MG capsule TAKE ONE CAPSULE BY MOUTH 3 TIMES A DAY 09/18/23   Meldon Sport, MD  hydrochlorothiazide  (HYDRODIURIL ) 25 MG tablet TAKE 1 TABLET BY MOUTH ONCE A DAY. 06/03/23   Meldon Sport, MD  metFORMIN  (GLUCOPHAGE -XR) 500 MG 24 hr tablet TAKE ONE TABLET (=500MG  TOTAL) EVERY DAY WITH BREAKFAST 10/22/23   Meldon Sport, MD  olopatadine  (PATADAY ) 0.1 % ophthalmic solution Place 1 drop into both eyes 2 (two) times daily. 01/01/23   Meldon Sport, MD  oxyCODONE  (OXY IR/ROXICODONE ) 5 MG immediate release tablet Take 1 tablet (5 mg total) by mouth every 6 (six) hours as needed for moderate pain. 02/07/23   Meldon Sport, MD  pantoprazole  (PROTONIX ) 40 MG tablet TAKE (1) TABLET BY MOUTH TWICE DAILY BEFORE MEALS. 11/06/23   Vinetta Greening, DO  rosuvastatin  (CRESTOR ) 5 MG tablet Take 1 tablet (5 mg total) by mouth daily. 11/14/22   Cleola Dach  K, MD      Allergies    Amoxicillin, Aspirin, and Nsaids    Review of Systems   Review of Systems  Constitutional:  Negative for chills and fever.  Respiratory:  Negative for shortness of breath.   Cardiovascular:  Negative for chest pain and leg swelling.  Gastrointestinal:  Negative for abdominal distention, abdominal pain and constipation.  Genitourinary:  Negative for difficulty urinating, dysuria, flank pain, frequency and urgency.  Musculoskeletal:  Positive for back pain and neck pain. Negative for gait problem and joint swelling.  Skin:  Negative for rash.  Neurological:  Negative for  weakness and numbness.    Physical Exam Updated Vital Signs BP 110/73   Pulse 91   Temp 98.4 F (36.9 C) (Oral)   Resp 14   Ht 5\' 7"  (1.702 m)   Wt 124.3 kg   SpO2 96%   BMI 42.91 kg/m  Physical Exam Vitals and nursing note reviewed.  Constitutional:      Appearance: She is well-developed.  HENT:     Head: Normocephalic.  Eyes:     Conjunctiva/sclera: Conjunctivae normal.  Cardiovascular:     Rate and Rhythm: Normal rate.     Pulses: Normal pulses.     Comments: Pedal pulses normal. Pulmonary:     Effort: Pulmonary effort is normal.  Abdominal:     General: Bowel sounds are normal. There is no distension.     Palpations: Abdomen is soft. There is no mass.  Musculoskeletal:     Cervical back: Normal range of motion and neck supple.     Lumbar back: Tenderness present. No swelling, edema or spasms. Positive left straight leg raise test. Negative right straight leg raise test.     Comments: No midline lumbar pain.  TTP left sciatic notch.  No foot drop.  Pain with left hip flexion,  no weakness with flex/ext maneuver of hips, knees, ankles.   Skin:    General: Skin is warm and dry.  Neurological:     General: No focal deficit present.     Mental Status: She is alert and oriented to person, place, and time.     Sensory: No sensory deficit.     Motor: No tremor or atrophy.     Gait: Gait normal.     Comments: No strength deficit noted in hip and knee flexor and extensor muscle groups.  Ankle flexion and extension intact.     ED Results / Procedures / Treatments   Labs (all labs ordered are listed, but only abnormal results are displayed) Labs Reviewed - No data to display  EKG None  Radiology No results found.  Procedures Procedures    Medications Ordered in ED Medications  dexamethasone  (DECADRON ) injection 10 mg (10 mg Intravenous Given 11/09/23 1354)  HYDROmorphone  (DILAUDID ) injection 1 mg (1 mg Intravenous Given 11/09/23 1354)  ondansetron  (ZOFRAN )  injection 4 mg (4 mg Intravenous Given 11/09/23 1350)  oxyCODONE -acetaminophen  (PERCOCET/ROXICET) 5-325 MG per tablet 2 tablet (2 tablets Oral Given 11/09/23 1539)    ED Course/ Medical Decision Making/ A&P                                 Medical Decision Making Patient presenting with symptoms suggesting left sciatica.  She has no neurovascular compromise on her exam or by history.  She is currently under the care of Dr.  Andy Bannister of neurosurgery and has a cervical fusion surgery  scheduled for May 13 with him.  In the interim has developed left buttock pain which radiates down to her left lateral leg to her heel, no weakness or numbness  in this extremity.  No symptoms suggesting cauda equina.  She is placed on pain medication and a short prednisone  taper, advised close watch on her CBGs while on this prednisone .  Return precautions were outlined.  Amount and/or Complexity of Data Reviewed Radiology: ordered and independent interpretation performed.    Details: Patient wanted to leave prior to reading of imaging.  I have reviewed her lumbar spine, she does have significant degenerative joint disease, no mass, no compression fractures, no metastatic changes or other acute concerns.  Risk Prescription drug management.           Final Clinical Impression(s) / ED Diagnoses Final diagnoses:  Sciatica of left side    Rx / DC Orders ED Discharge Orders          Ordered    oxyCODONE -acetaminophen  (PERCOCET) 7.5-325 MG tablet  Every 6 hours PRN        11/09/23 1624    predniSONE  (DELTASONE ) 10 MG tablet        11/09/23 1627              Katherine Pancake, PA-C 11/09/23 1631    Cheyenne Cotta, MD 11/12/23 1121

## 2023-11-09 NOTE — ED Triage Notes (Signed)
 Pt to ER states she had a fall earlier this year and has spinal cord issues related.  States for last two days she has had left hip pain that is keeping her up at night. Denies new injuries.

## 2023-11-11 ENCOUNTER — Emergency Department (HOSPITAL_COMMUNITY)
Admission: EM | Admit: 2023-11-11 | Discharge: 2023-11-12 | Disposition: A | Discharge status: Home / Self Care | Attending: Emergency Medicine | Admitting: Emergency Medicine

## 2023-11-11 ENCOUNTER — Other Ambulatory Visit: Payer: Self-pay | Admitting: Internal Medicine

## 2023-11-11 ENCOUNTER — Other Ambulatory Visit: Payer: Self-pay

## 2023-11-11 ENCOUNTER — Emergency Department (HOSPITAL_COMMUNITY)

## 2023-11-11 ENCOUNTER — Encounter (HOSPITAL_COMMUNITY): Payer: Self-pay

## 2023-11-11 DIAGNOSIS — M5416 Radiculopathy, lumbar region: Secondary | ICD-10-CM

## 2023-11-11 DIAGNOSIS — M545 Low back pain, unspecified: Secondary | ICD-10-CM | POA: Diagnosis present

## 2023-11-11 DIAGNOSIS — E782 Mixed hyperlipidemia: Secondary | ICD-10-CM

## 2023-11-11 DIAGNOSIS — M79605 Pain in left leg: Secondary | ICD-10-CM | POA: Insufficient documentation

## 2023-11-11 MED ORDER — DICLOFENAC SODIUM 1 % EX GEL
4.0000 g | Freq: Four times a day (QID) | CUTANEOUS | Status: DC
  Administered 2023-11-12 (×2): 4 g via TOPICAL
  Filled 2023-11-11: qty 100

## 2023-11-11 MED ORDER — LORAZEPAM 0.5 MG PO TABS: 0.5000 mg | ORAL_TABLET | ORAL | Status: DC | PRN

## 2023-11-11 MED ORDER — TIZANIDINE HCL 4 MG PO TABS
4.0000 mg | ORAL_TABLET | Freq: Once | ORAL | Status: AC
  Administered 2023-11-11: 4 mg via ORAL
  Filled 2023-11-11: qty 1

## 2023-11-11 MED ORDER — DEXAMETHASONE SODIUM PHOSPHATE 10 MG/ML IJ SOLN
10.0000 mg | Freq: Once | INTRAMUSCULAR | Status: AC
Start: 2023-11-11 — End: 2023-11-11
  Administered 2023-11-11: 10 mg via INTRAVENOUS
  Filled 2023-11-11: qty 1

## 2023-11-11 MED ORDER — DICLOFENAC EPOLAMINE 1.3 % EX PTCH
1.0000 | MEDICATED_PATCH | Freq: Two times a day (BID) | CUTANEOUS | Status: DC
  Administered 2023-11-11: 1 via TRANSDERMAL
  Filled 2023-11-11: qty 1

## 2023-11-11 MED ORDER — HYDROMORPHONE HCL 1 MG/ML IJ SOLN
1.0000 mg | Freq: Once | INTRAMUSCULAR | Status: AC
  Administered 2023-11-11: 1 mg via INTRAMUSCULAR
  Filled 2023-11-11: qty 1

## 2023-11-11 MED ORDER — HYDROMORPHONE HCL 1 MG/ML IJ SOLN
1.0000 mg | Freq: Once | INTRAMUSCULAR | Status: AC
  Administered 2023-11-11: 1 mg via INTRAVENOUS
  Filled 2023-11-11: qty 1

## 2023-11-11 MED ORDER — OXYCODONE-ACETAMINOPHEN 5-325 MG PO TABS
2.0000 | ORAL_TABLET | Freq: Four times a day (QID) | ORAL | Status: DC | PRN
  Administered 2023-11-12 (×3): 2 via ORAL
  Filled 2023-11-11 (×3): qty 2

## 2023-11-11 MED ORDER — LIDOCAINE 5 % EX PTCH
1.0000 | MEDICATED_PATCH | CUTANEOUS | Status: DC
  Filled 2023-11-11: qty 1

## 2023-11-11 NOTE — ED Provider Notes (Signed)
 I received the patient in signout.  Plan is for reevaluation after MRI for ultimate disposition. Physical Exam  BP 111/68   Pulse 77   Temp 98.2 F (36.8 C) (Oral)   Resp 18   Ht 5\' 7"  (1.702 m)   Wt 124.3 kg   SpO2 94%   BMI 42.92 kg/m   Physical Exam General: No acute distress Neuro: The patient is able to ambulate with an antalgic gait due to pain but only for a few feet, difficult to test strength due to pain.  Reports normal sensation throughout bilateral lower extremities.  Procedures  Procedures  ED Course / MDM    Medical Decision Making Amount and/or Complexity of Data Reviewed Radiology: ordered.  Risk Prescription drug management.   The patient was unable to ambulate here on reassessment.  I did call and discussed her case with neurosurgery.  After review of the patient's MRI there is no indication for any acute neurosurgical intervention.  The patient will be kept in observation for social work to see the patient about rehabilitation as she is unable to ambulate on her own at this time.       Carin Charleston, MD 11/11/23 2223

## 2023-11-11 NOTE — ED Provider Notes (Signed)
 [place] EMERGENCY DEPARTMENT AT [facility] Provider Note   CSN: [id number] Arrival date & time: 11/11/23  1227     History  Chief Complaint  Patient presents with   Back Pain    Brenda Taylor is a 53 y.o. female.  HPI Patient presents with concern of back pain, left leg pain.  Patient has a history of radiculopathy, as well as prior fall with back injury.  She is active scheduled for May 13 spine surgery.  Today she presents with concern for pain in the left back, left buttock, going down the left leg.  She arrives via EMS, EMS reports same history. They deny any hemodynamic instability in transport.     Home Medications Prior to Admission medications   Medication Sig Start Date End Date Taking? Authorizing Provider  ALPRAZolam  (XANAX ) 0.5 MG tablet TAKE ONE TABLET BY MOUTH AT BEDTIME AS NEEDED FOR ANXIETY. 08/05/23   Meldon Sport, MD  benzonatate  (TESSALON ) 200 MG capsule Take 1 capsule (200 mg total) by mouth 2 (two) times daily as needed for cough. 11/06/23   Meldon Sport, MD  busPIRone  (BUSPAR ) 15 MG tablet TAKE 1 TABLET BY MOUTH ONCE DAILY. 05/02/23   Meldon Sport, MD  celecoxib  (CELEBREX ) 200 MG capsule TAKE ONE CAPSULE BY MOUTH ONCE DAILY. 10/02/23   Meldon Sport, MD  cyclobenzaprine  (FLEXERIL ) 10 MG tablet Take 10 mg by mouth as needed for muscle spasms.    [provider]  gabapentin  (NEURONTIN ) 400 MG capsule TAKE ONE CAPSULE BY MOUTH 3 TIMES A DAY 09/18/23   Meldon Sport, MD  hydrochlorothiazide  (HYDRODIURIL ) 25 MG tablet TAKE 1 TABLET BY MOUTH ONCE A DAY. 06/03/23   Meldon Sport, MD  metFORMIN  (GLUCOPHAGE -XR) 500 MG 24 hr tablet TAKE ONE TABLET (=500MG  TOTAL) EVERY DAY WITH BREAKFAST 10/22/23   Meldon Sport, MD  olopatadine  (PATADAY ) 0.1 % ophthalmic solution Place 1 drop into both eyes 2 (two) times daily. 01/01/23   Meldon Sport, MD  oxyCODONE  (OXY IR/ROXICODONE ) 5 MG immediate release tablet Take 1 tablet (5 mg total) by mouth  every 6 (six) hours as needed for moderate pain. 02/07/23   Meldon Sport, MD  oxyCODONE -acetaminophen  (PERCOCET) 7.5-325 MG tablet Take 1 tablet by mouth every 6 (six) hours as needed for severe pain (pain score 7-10). 11/09/23   Idol, Julie, PA-C  pantoprazole  (PROTONIX ) 40 MG tablet TAKE (1) TABLET BY MOUTH TWICE DAILY BEFORE MEALS. 11/06/23   Vinetta Greening, DO  predniSONE  (DELTASONE ) 10 MG tablet 4, 3, 2 then 1 tablet by mouth daily for 4 days total. 11/09/23   Idol, Concha Deed, PA-C  rosuvastatin  (CRESTOR ) 5 MG tablet TAKE ONE TABLET BY MOUTH ONCE DAILY. 11/11/23   Meldon Sport, MD      Allergies    Amoxicillin, Aspirin, and Nsaids    Review of Systems   Review of Systems  Physical Exam Updated Vital Signs BP 99/64   Pulse 74   Temp 98.4 F (36.9 C) (Oral)   Resp 18   Ht 5\' 7"  (1.702 m)   Wt 124.3 kg   SpO2 93%   BMI 42.92 kg/m  Physical Exam Vitals and nursing note reviewed.  Constitutional:      Appearance: She is well-developed. She is obese. She is ill-appearing.  HENT:     Head: Normocephalic and atraumatic.  Eyes:     Conjunctiva/sclera: Conjunctivae normal.  Cardiovascular:     Rate and Rhythm: Normal  rate and regular rhythm.     Pulses: Normal pulses.  Pulmonary:     Effort: Pulmonary effort is normal. No respiratory distress.     Breath sounds: No stridor.  Abdominal:     General: There is no distension.  Musculoskeletal:     Comments: Patient in right lateral decubitus position, states that she has pain throughout the left buttock and left leg.  Skin:    General: Skin is warm and dry.  Neurological:     Mental Status: She is alert and oriented to person, place, and time.     Cranial Nerves: No cranial nerve deficit.     Comments: Patient describes subjective heaviness in the left foot, but does wiggle her toes, has appropriate color  Psychiatric:        Mood and Affect: Mood normal.     ED Results / Procedures / Treatments   Labs (all labs ordered  are listed, but only abnormal results are displayed) Labs Reviewed - No data to display  EKG None  Radiology No results found.  Procedures Procedures    Medications Ordered in ED Medications  diclofenac  (FLECTOR ) 1.3 % 1 patch (1 patch Transdermal Patch Applied 11/11/23 1347)  tiZANidine  (ZANAFLEX ) tablet 4 mg (has no administration in time range)  HYDROmorphone  (DILAUDID ) injection 1 mg (1 mg Intramuscular Given 11/11/23 1346)  dexamethasone  (DECADRON ) injection 10 mg (10 mg Intravenous Given 11/11/23 1347)  HYDROmorphone  (DILAUDID ) injection 1 mg (1 mg Intramuscular Given 11/11/23 1433)    ED Course/ Medical Decision Making/ A&P                                 Medical Decision Making Adult female known spinal disease presents with left leg buttock pain.  Does not describe incontinence, or retention, no red flags for CNS pathology.  Given her history, suspicion for lumbosacral radiculopathy.  Patient received meds here, though there are some medications given the patient's allergic profile.   Amount and/or Complexity of Data Reviewed Independent Historian: EMS External Data Reviewed: notes.    Details: ED note from last month reviewed  Risk Prescription drug management. Decision regarding hospitalization.   3:28 PM Patient partially able to bear weight, with assistance while trying to go to the bathroom.  She continues to describe the pain as severe rating up the back of the left leg, consistent with sciatica-like pattern.  She had 1 episode of urinating in the bed, though it is unclear if this was incontinence or from initial pain medication.  Given the severity of symptoms, questionable incontinence, MRI pending.        Final Clinical Impression(s) / ED Diagnoses Final diagnoses:  None    Rx / DC Orders ED Discharge Orders     None         Dorenda Gandy, MD 11/11/23 1528

## 2023-11-11 NOTE — ED Triage Notes (Signed)
 Pt BIB ems for left side back pain that radiates to left foot "numbness." Pt was seen in ED on Saturday and prescribed pain medication she also is being followed by Dr. Lydia Sams and scheduled for surgery on May 13. Pt informed EMS she took her percocet before they arrived with no relief. Pt is tearful during triage pain 10/10.

## 2023-11-11 NOTE — ED Notes (Signed)
 Tried to ambulate pt with walker, pt reached the doorway and stated she was in too much pain to walk.

## 2023-11-12 LAB — CBG MONITORING, ED: Glucose-Capillary: 93 mg/dL (ref 70–99)

## 2023-11-12 MED ORDER — PANTOPRAZOLE SODIUM 40 MG PO TBEC
40.0000 mg | DELAYED_RELEASE_TABLET | Freq: Two times a day (BID) | ORAL | Status: DC
  Administered 2023-11-12: 40 mg via ORAL
  Filled 2023-11-12: qty 1

## 2023-11-12 MED ORDER — BUSPIRONE HCL 5 MG PO TABS
15.0000 mg | ORAL_TABLET | Freq: Every day | ORAL | Status: DC
  Administered 2023-11-12: 15 mg via ORAL
  Filled 2023-11-12: qty 3

## 2023-11-12 MED ORDER — PREDNISONE 10 MG (21) PO TBPK: ORAL_TABLET | Freq: Every day | ORAL | 0 refills | Status: DC

## 2023-11-12 MED ORDER — METHOCARBAMOL 500 MG PO TABS: 1000.0000 mg | ORAL_TABLET | Freq: Three times a day (TID) | ORAL | 0 refills | Status: DC | PRN

## 2023-11-12 MED ORDER — GABAPENTIN 400 MG PO CAPS
400.0000 mg | ORAL_CAPSULE | Freq: Three times a day (TID) | ORAL | Status: DC
  Administered 2023-11-12 (×2): 400 mg via ORAL
  Filled 2023-11-12 (×2): qty 1

## 2023-11-12 MED ORDER — METFORMIN HCL ER 500 MG PO TB24
500.0000 mg | ORAL_TABLET | Freq: Every day | ORAL | Status: DC
  Administered 2023-11-12: 500 mg via ORAL
  Filled 2023-11-12 (×2): qty 1

## 2023-11-12 MED ORDER — LIDOCAINE 5 % EX PTCH: 1.0000 | MEDICATED_PATCH | CUTANEOUS | 0 refills | Status: DC

## 2023-11-12 NOTE — Evaluation (Signed)
 Physical Therapy Evaluation Patient Details Name: Brenda Taylor MRN: [id number] DOB: [birth year] Today's Date: 11/12/2023  History of Present Illness  Brenda Taylor is a 53 y.o. female.     HPI  Patient presents with concern of back pain, left leg pain.  Patient has a history of radiculopathy, as well as prior fall with back injury.  She is active scheduled for May 13 spine surgery.  Today she presents with concern for pain in the left back, left buttock, going down the left leg.  She arrives via EMS, EMS reports same history.  They deny any hemodynamic instability in transport.   Clinical Impression  Patient has difficulty moving LLE during bed mobility due to radiating paint from low back, unsteady on feet requiring hand held assist for maintaining standing balance, required use of RW for gait training with fair/good return demonstrated for walking in hallway without loss of balance, but limited mostly due to c/o fatigue and low back pain with radiation down LLE. Patient put back to bed after therapy. Patient will benefit from continued skilled physical therapy in hospital and recommended venue below to increase strength, balance, endurance for safe ADLs and gait.         If plan is discharge home, recommend the following: A lot of help with walking and/or transfers;A lot of help with bathing/dressing/bathroom;Assistance with cooking/housework;Help with stairs or ramp for entrance   Can travel by private vehicle        Equipment Recommendations BSC/3in1  Recommendations for Other Services       Functional Status Assessment Patient has had a recent decline in their functional status and demonstrates the ability to make significant improvements in function in a reasonable and predictable amount of time.     Precautions / Restrictions Precautions Precautions: Fall Restrictions Weight Bearing Restrictions Per Provider Order: No      Mobility  Bed Mobility Overal bed mobility: Needs  Assistance Bed Mobility: Rolling, Sidelying to Sit, Sit to Sidelying Rolling: Contact guard assist, Min assist Sidelying to sit: Min assist     Sit to sidelying: Min assist, Mod assist General bed mobility comments: slow labored movement with c/o increased low back pain    Transfers Overall transfer level: Needs assistance Equipment used: Rolling walker (2 wheels) Transfers: Sit to/from Stand, Bed to chair/wheelchair/BSC Sit to Stand: Min assist   Step pivot transfers: Min assist       General transfer comment: slow labored movement, difficulty moving LLE due to pain    Ambulation/Gait Ambulation/Gait assistance: Min assist, Mod assist Gait Distance (Feet): 25 Feet Assistive device: Rolling walker (2 wheels) Gait Pattern/deviations: Decreased step length - right, Decreased step length - left, Decreased stride length, Antalgic Gait velocity: slow     General Gait Details: slow labored movement with difficulty advancing LLE due to increasing back pain with radiation  Stairs            Wheelchair Mobility     Tilt Bed    Modified Rankin (Stroke Patients Only)       Balance Overall balance assessment: Needs assistance Sitting-balance support: Feet supported, Bilateral upper extremity supported Sitting balance-Leahy Scale: Fair Sitting balance - Comments: seated at EOB   Standing balance support: Reliant on assistive device for balance, During functional activity, Bilateral upper extremity supported Standing balance-Leahy Scale: Fair Standing balance comment: using RW  Pertinent Vitals/Pain Pain Assessment Pain Assessment: 0-10 Pain Score: 8  Pain Location: low back with radiation down LLE Pain Descriptors / Indicators: Grimacing, Guarding, Sharp Pain Intervention(s): Limited activity within patient's tolerance, Monitored during session, Repositioned    Home Living Family/patient expects to be discharged to::  Private residence Living Arrangements: Spouse/significant other Available Help at Discharge: Family;Available 24 hours/day Type of Home: House Home Access: Stairs to enter Entrance Stairs-Rails: Left Entrance Stairs-Number of Steps: 4   Home Layout: One level Home Equipment: Rollator (4 wheels);Cane - single point      Prior Function Prior Level of Function : Independent/Modified Independent;Working/employed;Driving             Mobility Comments: Community ambulation without AD, drives, works ADLs Comments: Independent     Extremity/Trunk Assessment   Upper Extremity Assessment Upper Extremity Assessment: Generalized weakness    Lower Extremity Assessment Lower Extremity Assessment: Generalized weakness;LLE deficits/detail LLE Deficits / Details: grossly -4/5 LLE: Unable to fully assess due to pain LLE Sensation: WNL LLE Coordination: WNL    Cervical / Trunk Assessment Cervical / Trunk Assessment: Kyphotic  Communication   Communication Communication: No apparent difficulties    Cognition Arousal: Alert Behavior During Therapy: WFL for tasks assessed/performed   PT - Cognitive impairments: No apparent impairments                         Following commands: Intact       Cueing Cueing Techniques: Verbal cues, Tactile cues     General Comments      Exercises     Assessment/Plan    PT Assessment Patient needs continued PT services  PT Problem List Decreased strength;Decreased activity tolerance;Decreased balance;Decreased mobility       PT Treatment Interventions DME instruction;Gait training;Stair training;Functional mobility training;Therapeutic activities;Therapeutic exercise;Balance training;Patient/family education    PT Goals (Current goals can be found in the Care Plan section)  Acute Rehab PT Goals Patient Stated Goal: return home with family to assist PT Goal Formulation: With patient Time For Goal Achievement:  11/19/23 Potential to Achieve Goals: Good    Frequency Min 2X/week     Co-evaluation               AM-PAC PT "6 Clicks" Mobility  Outcome Measure Help needed turning from your back to your side while in a flat bed without using bedrails?: A Little Help needed moving from lying on your back to sitting on the side of a flat bed without using bedrails?: A Lot Help needed moving to and from a bed to a chair (including a wheelchair)?: A Little Help needed standing up from a chair using your arms (e.g., wheelchair or bedside chair)?: A Little Help needed to walk in hospital room?: A Lot Help needed climbing 3-5 steps with a railing? : A Lot 6 Click Score: 15    End of Session Equipment Utilized During Treatment: Gait belt Activity Tolerance: Patient tolerated treatment well;Patient limited by fatigue;Patient limited by pain Patient left: in bed;with call bell/phone within reach Nurse Communication: Mobility status PT Visit Diagnosis: Unsteadiness on feet (R26.81);Other abnormalities of gait and mobility (R26.89);Muscle weakness (generalized) (M62.81)    Time: 1610-9604 PT Time Calculation (min) (ACUTE ONLY): 16 min   Charges:   PT Evaluation $PT Eval Low Complexity: 1 Low PT Treatments $Therapeutic Activity: 8-22 mins PT General Charges $$ ACUTE PT VISIT: 1 Visit         10:49 AM, 11/12/23 Brenda Taylor, MPT  Physical Therapist with [facility] 336 [phone] office [phone] mobile phone

## 2023-11-12 NOTE — ED Notes (Addendum)
 PT recommended HHPT or OPPT . CSW spoke with patient about recommendation. Patient agreeable to recommendation . Patient asked if CSW could have therapy come out to her house. CSW reached out to [facility] and awaiting on response.  PT also recommended BSC. Patient agreeable to CSW to arranging equipment with [facility]. TOC will continue to follow.   Addendum 2:16 pm  [facility] accepted patient insurance to do PT/OT in the home.

## 2023-11-12 NOTE — ED Provider Notes (Addendum)
 Emergency Medicine Observation Re-evaluation Note  Brenda Taylor is a 53 y.o. female, seen on rounds today.  Pt initially presented to the ED for complaints of Back Pain Currently, the patient is sleeping.  Physical Exam  BP 130/65   Pulse 74   Temp 98.2 F (36.8 C) (Oral)   Resp 18   Ht 5\' 7"  (1.702 m)   Wt 124.3 kg   SpO2 94%   BMI 42.92 kg/m  Physical Exam General: Sleeping Cardiac: Extremities well-perfused Lungs: Breathing is unlabored Psych: Deferred  ED Course / MDM  EKG:   I have reviewed the labs performed to date as well as medications administered while in observation.  Recent changes in the last 24 hours include presentation to the emergency department yesterday for low back pain with left-sided sciatica.  MRI showed a large disc bulge with left subarticular extrusion and migration at L4-5 with displacement of left L5 nerve root.  Case discussed with neurosurgery who does not feel there is any indication for acute intervention.  Patient was unable to ambulate and was kept in the ED for social work consult.  Patient underwent physical therapy evaluation.  She does require help with walking and transfers.  She would benefit from 3in1 bedside commode due to unsteadiness on feet and other abnormalities of gait and mobility.  Face-to-face orders were placed.  Multimodal pain control was prescribed.  Plan  Current plan is for discharge with home health PT/OT.    Iva Mariner, MD 11/12/23 3474    Iva Mariner, MD 11/12/23 1402    Iva Mariner, MD 11/12/23 [phone]

## 2023-11-12 NOTE — Plan of Care (Signed)
  Problem: Acute Rehab PT Goals(only PT should resolve) Goal: Pt Will Go Supine/Side To Sit Outcome: Progressing Flowsheets (Taken 11/12/2023 1051) Pt will go Supine/Side to Sit: with contact guard assist Goal: Patient Will Transfer Sit To/From Stand Outcome: Progressing Flowsheets (Taken 11/12/2023 1051) Patient will transfer sit to/from stand: with contact guard assist Goal: Pt Will Transfer Bed To Chair/Chair To Bed Outcome: Progressing Flowsheets (Taken 11/12/2023 1051) Pt will Transfer Bed to Chair/Chair to Bed: with contact guard assist Goal: Pt Will Ambulate Outcome: Progressing Flowsheets (Taken 11/12/2023 1051) Pt will Ambulate:  50 feet  with contact guard assist  with supervision  with rolling walker   10:51 AM, 11/12/23 Walton Guppy, MPT Physical Therapist with [facility] 336 [phone] office [phone] mobile phone

## 2023-11-12 NOTE — ED Notes (Signed)
 Talked with pharmacy who states her metformin  is on the way down now

## 2023-11-12 NOTE — Discharge Instructions (Addendum)
 Prescriptions were sent to your pharmacy. Take methocarbamol  in place of Flexeril . Take prednisone  tapering dose as prescribed. Lidocaine  patches can be replaced daily.  Avoid having more than 1 patch on your skin. Continue your other home pain medications as prescribed.  Keep your appointment with your spine doctor.  You should be hearing from home health services for physical therapy and support at home.  Return to the emergency department for any new or worsening symptoms of concern.

## 2023-11-12 NOTE — ED Notes (Signed)
 Transition of Care [facility]) - Emergency Department Mini Assessment   Patient Details  Name: Brenda Taylor MRN: [id number] Date of Birth: 1971/07/12  Transition of Care [facility] CM/SW Contact:    Cyndie Dredge, LCSWA Phone Number: 11/12/2023, 9:06 AM   Clinical Narrative:  CSW spoke with patient daughter this morning and then patient later on afterwards. Patient lives at home with her boyfriend and grandchildren she is caring for. Patient has a walker and cane at home. Patient was still working as a Production designer, theatre/television/film at [employer] and getting around driving up until her leg pain and not being able to walk . Patient has had numerous back surgery's and one upcoming on May 13th. CSW shared with daughter and patient about MD placing a consult for SNF. Patient states that she rather go home because her fiance helps her with everything . Patient was going to outpatient therapy at [facility] [address] in [place] until her doctor took her out due to it causing more damage then good. While speaking with patient, PT had arrived to work with patient.  TOC to follow.    ED Mini Assessment: What brought you to the Emergency Department? : Leg pain  Barriers to Discharge: Continued Medical Work up  [employer] interventions: Consult placed for SNF placement  Means of departure: Ambulance  Interventions which prevented an admission or readmission: SNF Placement    Patient Contact and Communications Key Contact 1: Solicitor 2: Barnett Libel with: Daughter and patient Contact Date: 11/12/23,   Contact time: 0902 Contact Phone Number: [phone] and [phone] Call outcome: Consult for SNF and start of leg pain  Patient states their goals for this hospitalization and ongoing recovery are:: Get her surgery sooner than May 13 and figure out why she is having pain in her leg CMS Medicare.gov Compare Post Acute Care list provided to:: Patient Choice offered to / list presented to :  Patient  Admission diagnosis:  Leg Pain Patient Active Problem List   Diagnosis Date Noted   Preop examination 11/06/2023   Cervical spondylosis with radiculopathy 04/24/2023   Hot flashes 04/24/2023   DDD (degenerative disc disease), lumbar 02/07/2023   Hospital discharge follow-up 02/07/2023   Syncope 01/27/2023   Central cord syndrome (HCC) 01/27/2023   Allergic conjunctivitis of both eyes 01/02/2023   Meralgia paresthetica of left side 01/01/2023   Type 2 diabetes mellitus with other specified complication (HCC) 11/14/2022   Plantar wart of both feet 11/13/2022   Simple chronic bronchitis (HCC) 11/13/2022   Acute sinusitis 09/13/2022   Sciatica 04/30/2022   Primary osteoarthritis of hands, bilateral 03/06/2022   Gastroesophageal reflux disease without esophagitis 03/06/2022   Restless legs syndrome (RLS) 07/18/2021   Intertrigo 05/30/2021   Early satiety 04/10/2021   Encounter for general adult medical examination with abnormal findings 12/15/2020   Mixed hyperlipidemia 12/15/2020   Vitamin D  deficiency 09/15/2020   Osteoarthritis of right foot 07/05/2020   Right hand weakness 06/28/2020   History of fusion of cervical spine 06/28/2020   Arthritis 06/01/2020   Diabetic neuropathy (HCC) 06/01/2020   Hearing problem of right ear 05/02/2020   Obesity (BMI 30-39.9) 05/02/2020   Tobacco abuse 05/02/2020   Prediabetes 05/02/2020   GAD (generalized anxiety disorder) 05/07/2019   Hypertension 04/23/2019   HNP (herniated nucleus pulposus), lumbar 07/03/2013   Chronic idiopathic constipation 10/31/2012   PCP:  Meldon Sport, MD Pharmacy:   [facility] [place], [place] - [address] [phone]  [address] [place] [address] Phone: [phone] Fax: [phone]  [facility] [address] [place] - [address] [place] [address] [place] [address] [place] [address] Phone: [phone] Fax: [phone]

## 2023-11-14 ENCOUNTER — Other Ambulatory Visit: Payer: Self-pay | Admitting: Internal Medicine

## 2023-11-14 DIAGNOSIS — M51369 Other intervertebral disc degeneration, lumbar region without mention of lumbar back pain or lower extremity pain: Secondary | ICD-10-CM

## 2023-11-14 DIAGNOSIS — S14129D Central cord syndrome at unspecified level of cervical spinal cord, subsequent encounter: Secondary | ICD-10-CM

## 2023-11-15 ENCOUNTER — Telehealth: Payer: Self-pay

## 2023-11-15 NOTE — Telephone Encounter (Signed)
 Copied from CRM [id number]. Topic: General - Other >> Nov 14, 2023  1:21 PM Lotus Round B wrote: Reason for CRM: pt called in to see if she can get a work note for being out of work due to blowing out her L5 and was in the hospital Saturday 4/19 and went back Monday 4/21 . She also said she started PT today 4/24 . So if it is possible to please give her a call if that note can be done for her.

## 2023-11-15 NOTE — Telephone Encounter (Signed)
 Pt would like a leave of absence work note as of 04/19 is now doing PT is unable to do much walking.

## 2023-11-15 NOTE — Telephone Encounter (Signed)
 Copied from CRM [id number]. Topic: Clinical - Home Health Verbal Orders >> Nov 14, 2023  1:08 PM [place] wrote: Caller/Agency: Rachelle with [facility] Callback Number: [phone] Service Requested: Physical Therapy Frequency: 2x a week for 2 weeks Any new concerns about the patient? No

## 2023-11-15 NOTE — Telephone Encounter (Signed)
 Okay to do?

## 2023-11-18 NOTE — Telephone Encounter (Signed)
 Pt scheduled on 11/19/2023 to further discuss.

## 2023-11-19 ENCOUNTER — Telehealth: Admitting: Internal Medicine

## 2023-11-20 ENCOUNTER — Telehealth: Payer: Self-pay

## 2023-11-20 NOTE — Telephone Encounter (Signed)
 Copied from CRM [id number]. Topic: Clinical - Home Health Verbal Orders >> Nov 20, 2023  1:41 PM Rennis Case wrote: Caller/Agency: [place] // Harolyn Likes Number: [phone] Service Requested: Occupational Therapy Frequency: 1 x 4 Any new concerns about the patient? No

## 2023-11-20 NOTE — Telephone Encounter (Signed)
Pt informed

## 2023-11-21 ENCOUNTER — Other Ambulatory Visit: Payer: Self-pay | Admitting: Internal Medicine

## 2023-11-21 DIAGNOSIS — E1169 Type 2 diabetes mellitus with other specified complication: Secondary | ICD-10-CM

## 2023-11-22 ENCOUNTER — Encounter (HOSPITAL_COMMUNITY): Payer: Self-pay

## 2023-11-22 ENCOUNTER — Telehealth: Payer: Self-pay | Admitting: Internal Medicine

## 2023-11-22 NOTE — Pre-Procedure Instructions (Signed)
 Surgical Instructions   Your procedure is scheduled on Dec 03, 2023. Report to [facility] Main Entrance "A" at 5:30 A.M., then check in with the Admitting office. Any questions or running late day of surgery: call [phone]  Questions prior to your surgery date: call [phone], Monday-Friday, 8am-4pm. If you experience any cold or flu symptoms such as cough, fever, chills, shortness of breath, etc. between now and your scheduled surgery, please notify us  at the above number.     Remember:  Do not eat or drink after midnight the night before your surgery   Take these medicines the morning of surgery with A SIP OF WATER : busPIRone  (BUSPAR )  gabapentin  (NEURONTIN )  olopatadine  (PATADAY ) ophthalmic solution  pantoprazole  (PROTONIX )  predniSONE   rosuvastatin  (CRESTOR )    May take these medicines IF NEEDED: ALPRAZolam  (XANAX )  benzonatate  (TESSALON )  methocarbamol  (ROBAXIN )  oxyCODONE  (OXY IR/ROXICODONE )  oxyCODONE -acetaminophen  (PERCOCET)    One week prior to surgery, STOP taking any Aspirin (unless otherwise instructed by your surgeon) Aleve , Naproxen , Ibuprofen , Motrin , Advil , Goody's, BC's, all herbal medications, fish oil, and non-prescription vitamins. This includes your medication: celecoxib  (CELEBREX )    WHAT DO I DO ABOUT MY DIABETES MEDICATION?   Do not take metFORMIN  (GLUCOPHAGE -XR) the morning of surgery.   HOW TO MANAGE YOUR DIABETES BEFORE AND AFTER SURGERY  Why is it important to control my blood sugar before and after surgery? Improving blood sugar levels before and after surgery helps healing and can limit problems. A way of improving blood sugar control is eating a healthy diet by:  Eating less sugar and carbohydrates  Increasing activity/exercise  Talking with your doctor about reaching your blood sugar goals High blood sugars (greater than 180 mg/dL) can raise your risk of infections and slow your recovery, so you will need to focus on controlling  your diabetes during the weeks before surgery. Make sure that the doctor who takes care of your diabetes knows about your planned surgery including the date and location.  How do I manage my blood sugar before surgery? Check your blood sugar at least 4 times a day, starting 2 days before surgery, to make sure that the level is not too high or low.  Check your blood sugar the morning of your surgery when you wake up and every 2 hours until you get to the Short Stay unit.  If your blood sugar is less than 70 mg/dL, you will need to treat for low blood sugar: Do not take insulin . Treat a low blood sugar (less than 70 mg/dL) with  cup of clear juice (cranberry or apple), 4 glucose tablets, OR glucose gel. Recheck blood sugar in 15 minutes after treatment (to make sure it is greater than 70 mg/dL). If your blood sugar is not greater than 70 mg/dL on recheck, call [phone] for further instructions. Report your blood sugar to the short stay nurse when you get to Short Stay.  If you are admitted to the hospital after surgery: Your blood sugar will be checked by the staff and you will probably be given insulin  after surgery (instead of oral diabetes medicines) to make sure you have good blood sugar levels. The goal for blood sugar control after surgery is 80-180 mg/dL.                      Do NOT Smoke (Tobacco/Vaping) for 24 hours prior to your procedure.  If you use a CPAP at night, you may bring your mask/headgear for your  overnight stay.   You will be asked to remove any contacts, glasses, piercing's, hearing aid's, dentures/partials prior to surgery. Please bring cases for these items if needed.    Patients discharged the day of surgery will not be allowed to drive home, and someone needs to stay with them for 24 hours.  SURGICAL WAITING ROOM VISITATION Patients may have no more than 2 support people in the waiting area - these visitors may rotate.   Pre-op nurse will coordinate an  appropriate time for 1 ADULT support person, who may not rotate, to accompany patient in pre-op.  Children under the age of 23 must have an adult with them who is not the patient and must remain in the main waiting area with an adult.  If the patient needs to stay at the hospital during part of their recovery, the visitor guidelines for inpatient rooms apply.  Please refer to the [facility] website for the visitor guidelines for any additional information.   If you received a COVID test during your pre-op visit  it is requested that you wear a mask when out in public, stay away from anyone that may not be feeling well and notify your surgeon if you develop symptoms. If you have been in contact with anyone that has tested positive in the last 10 days please notify you surgeon.      Pre-operative 5 CHG Bathing Instructions   You can play a key role in reducing the risk of infection after surgery. Your skin needs to be as free of germs as possible. You can reduce the number of germs on your skin by washing with CHG (chlorhexidine  gluconate) soap before surgery. CHG is an antiseptic soap that kills germs and continues to kill germs even after washing.   DO NOT use if you have an allergy to chlorhexidine /CHG or antibacterial soaps. If your skin becomes reddened or irritated, stop using the CHG and notify one of our RNs at [phone].   Please shower with the CHG soap starting 4 days before surgery using the following schedule:     Please keep in mind the following:  DO NOT shave, including legs and underarms, starting the day of your first shower.   You may shave your face at any point before/day of surgery.  Place clean sheets on your bed the day you start using CHG soap. Use a clean washcloth (not used since being washed) for each shower. DO NOT sleep with pets once you start using the CHG.   CHG Shower Instructions:  Wash your face and private area with normal soap. If you choose to  wash your hair, wash first with your normal shampoo.  After you use shampoo/soap, rinse your hair and body thoroughly to remove shampoo/soap residue.  Turn the water  OFF and apply about 3 tablespoons (45 ml) of CHG soap to a CLEAN washcloth.  Apply CHG soap ONLY FROM YOUR NECK DOWN TO YOUR TOES (washing for 3-5 minutes)  DO NOT use CHG soap on face, private areas, open wounds, or sores.  Pay special attention to the area where your surgery is being performed.  If you are having back surgery, having someone wash your back for you may be helpful. Wait 2 minutes after CHG soap is applied, then you may rinse off the CHG soap.  Pat dry with a clean towel  Put on clean clothes/pajamas   If you choose to wear lotion, please use ONLY the CHG-compatible lotions that are listed below.  Additional instructions for the day of surgery: DO NOT APPLY any lotions, deodorants, cologne, or perfumes.   Do not bring valuables to the hospital. [facility] is not responsible for any belongings/valuables. Do not wear nail polish, gel polish, artificial nails, or any other type of covering on natural nails (fingers and toes) Do not wear jewelry or makeup Put on clean/comfortable clothes.  Please brush your teeth.  Ask your nurse before applying any prescription medications to the skin.     CHG Compatible Lotions   Aveeno Moisturizing lotion  Cetaphil Moisturizing Cream  Cetaphil Moisturizing Lotion  Clairol Herbal Essence Moisturizing Lotion, Dry Skin  Clairol Herbal Essence Moisturizing Lotion, Extra Dry Skin  Clairol Herbal Essence Moisturizing Lotion, Normal Skin  Curel Age Defying Therapeutic Moisturizing Lotion with Alpha Hydroxy  Curel Extreme Care Body Lotion  Curel Soothing Hands Moisturizing Hand Lotion  Curel Therapeutic Moisturizing Cream, Fragrance-Free  Curel Therapeutic Moisturizing Lotion, Fragrance-Free  Curel Therapeutic Moisturizing Lotion, Original Formula  Eucerin Daily Replenishing  Lotion  Eucerin Dry Skin Therapy Plus Alpha Hydroxy Crme  Eucerin Dry Skin Therapy Plus Alpha Hydroxy Lotion  Eucerin Original Crme  Eucerin Original Lotion  Eucerin Plus Crme Eucerin Plus Lotion  Eucerin TriLipid Replenishing Lotion  Keri Anti-Bacterial Hand Lotion  Keri Deep Conditioning Original Lotion Dry Skin Formula Softly Scented  Keri Deep Conditioning Original Lotion, Fragrance Free Sensitive Skin Formula  Keri Lotion Fast Absorbing Fragrance Free Sensitive Skin Formula  Keri Lotion Fast Absorbing Softly Scented Dry Skin Formula  Keri Original Lotion  Keri Skin Renewal Lotion Keri Silky Smooth Lotion  Keri Silky Smooth Sensitive Skin Lotion  Nivea Body Creamy Conditioning Oil  Nivea Body Extra Enriched Lotion  Nivea Body Original Lotion  Nivea Body Sheer Moisturizing Lotion Nivea Crme  Nivea Skin Firming Lotion  NutraDerm 30 Skin Lotion  NutraDerm Skin Lotion  NutraDerm Therapeutic Skin Cream  NutraDerm Therapeutic Skin Lotion  ProShield Protective Hand Cream  Provon moisturizing lotion  Please read over the following fact sheets that you were given.

## 2023-11-22 NOTE — Telephone Encounter (Signed)
 Noted will be on the lookout for this.

## 2023-11-22 NOTE — Telephone Encounter (Signed)
 Copied from CRM [id number]. Topic: General - Other >> Nov 21, 2023  4:38 PM [place] B wrote: Reason for CRM: Bambi Lever from [facility] says will be faxing form for bp monitor pt

## 2023-11-25 ENCOUNTER — Encounter (HOSPITAL_COMMUNITY): Payer: Self-pay

## 2023-11-25 ENCOUNTER — Encounter (HOSPITAL_COMMUNITY)
Admission: RE | Admit: 2023-11-25 | Discharge: 2023-11-25 | Disposition: A | Discharge status: Home / Self Care | Source: Ambulatory Visit | Attending: Neurosurgery | Admitting: Neurosurgery

## 2023-11-25 ENCOUNTER — Other Ambulatory Visit: Payer: Self-pay

## 2023-11-25 ENCOUNTER — Other Ambulatory Visit (HOSPITAL_COMMUNITY)

## 2023-11-25 VITALS — BP 129/88 | HR 92 | Temp 98.2°F | Resp 17 | Ht 67.0 in | Wt 274.0 lb

## 2023-11-25 DIAGNOSIS — E119 Type 2 diabetes mellitus without complications: Secondary | ICD-10-CM | POA: Diagnosis not present

## 2023-11-25 DIAGNOSIS — Z01818 Encounter for other preprocedural examination: Secondary | ICD-10-CM | POA: Diagnosis present

## 2023-11-25 HISTORY — DX: Unspecified osteoarthritis, unspecified site: M19.90

## 2023-11-25 HISTORY — DX: Myoneural disorder, unspecified: G70.9

## 2023-11-25 HISTORY — DX: Personal history of other diseases of the digestive system: Z87.19

## 2023-11-25 HISTORY — DX: Depression, unspecified: F32.A

## 2023-11-25 LAB — TYPE AND SCREEN
ABO/RH(D): O NEG
Antibody Screen: NEGATIVE

## 2023-11-25 LAB — SURGICAL PCR SCREEN
MRSA, PCR: NEGATIVE
Staphylococcus aureus: POSITIVE — AB

## 2023-11-25 LAB — GLUCOSE, CAPILLARY: Glucose-Capillary: 229 mg/dL — ABNORMAL HIGH (ref 70–99)

## 2023-11-25 NOTE — Progress Notes (Signed)
 PCP - Dr. Cleola Dach Cardiologist - Pt saw Dr. Armida Lander 06/11/2019 for palpitations. Work-up completed and results normal. PRN follow-up per pt  PPM/ICD - Denies Device Orders - n/a Rep Notified - n/a  Chest x-ray - n/a  EKG - 11/06/2023 Stress Test - Denies ECHO - 01/31/2023 Cardiac Cath - Denies  Sleep Study - Denies. STOP BANG score of 5. Results sent to PCP    Pt is DM2. She does not own a glucose meter and does not check her blood sugars. Unknown fasting range. At pre-op appointment, pt blood sugar was 229. She has only had a few sips of Sun Drop to take her medications this morning including taking her DM medications. Last A1c 6.4 on 11/06/2023  Last dose of GLP1 agonist- n/a   GLP1 instructions: n/a  Blood Thinner Instructions: n/a Aspirin Instructions: n/a  NPO after midnight  COVID TEST- n/a   Anesthesia review: Yes. Medical Clearance from PCP. Hx of HTN, DM2 with blood sugar of 229 at appointment. Pt highly encouraged to buy a glucose meter prior to surgery to be able to check sugars at home. Pt informed that if her blood sugar were too high morning of surgery, there would be a chance that it would be cancelled  Patient denies shortness of breath, fever, cough and chest pain at PAT appointment. Pt denies any respiratory illness/infection in the last two months.    All instructions explained to the patient, with a verbal understanding of the material. Patient agrees to go over the instructions while at home for a better understanding. Patient also instructed to self quarantine after being tested for COVID-19. The opportunity to ask questions was provided.

## 2023-11-26 NOTE — Progress Notes (Signed)
 Anesthesia Chart Review:  53 yo female with pertinent hx including current smoker, HTN, hiatal hernia, PUD,  GERD on PPI, NIDDM2,   Pt had prior cardiology eval in 2020 for c/o palpitations. Event monitor showed primarily sinus rhythm ranging from sinus bradycardia to sinus tachycardia.  No evidence is SVT, atrial fibrillation, pauses, ventricular tachycardia, heart blocks. Did have some PVCs. PRN followup recommended. Recent echo 01/2023 showed EF 60-65%, normal RV function, no significant valvular abnormalities.  Hx of prior C6-7 ACDF. Cervical MRI 01/2023 showed disc protrusion at C3-4 with flattening of the ventral cord. Additional disc protrusions with cord contact at C2-3 and C4-5.  No formal diagnosis of OSA but BMI of 43 and STOP-BANG score of 5 raises concern.  Pt reports she does not have a glucometer and does not track BG. CBG at PAT was 229. Last A1c 6.4 on 11/06/23.  PCP clearance from Dr. Cleola Dach dated 11/06/23 states pt is moderate risk.   CMP and CBC 11/06/23 reviewed, unremarkable.   EKG 11/06/23: Sinus Rhythm. Rate 97. Nonspecific T-abnormality.  TTE 01/31/23: 1. Left ventricular ejection fraction, by estimation, is 60 to 65%. The  left ventricle has normal function. The left ventricle has no regional  wall motion abnormalities. Left ventricular diastolic parameters were  normal.   2. Right ventricular systolic function is normal. The right ventricular  size is normal.   3. The mitral valve is grossly normal. No evidence of mitral valve  regurgitation.   4. The aortic valve is tricuspid. Aortic valve regurgitation is not  visualized. No aortic stenosis is present.   5. The inferior vena cava is normal in size with greater than 50%  respiratory variability, suggesting right atrial pressure of 3 mmHg.   Comparison(s): No significant change from prior study.     Edilia Gordon [facility]/Anesthesiology Phone [phone] 11/26/2023 9:42 AM

## 2023-11-26 NOTE — Anesthesia Preprocedure Evaluation (Addendum)
 Anesthesia Evaluation  Patient identified by MRN, date of birth, ID band Patient awake    Reviewed: Allergy & Precautions, NPO status , Patient's Chart, lab work & pertinent test results  History of Anesthesia Complications Negative for: history of anesthetic complications  Airway Mallampati: II  TM Distance: >3 FB Neck ROM: Full    Dental  (+) Dental Advisory Given, Poor Dentition   Pulmonary COPD, Current SmokerPatient did not abstain from smoking.   breath sounds clear to auscultation       Cardiovascular hypertension, Pt. on medications (-) angina  Rhythm:Regular Rate:Normal  '24 ECHO: EF 60-65%, normal LVF, normal RVF, no significant valvular abnormalities   Neuro/Psych   Anxiety Depression       GI/Hepatic Neg liver ROS, hiatal hernia, PUD,GERD  Medicated and Controlled,,  Endo/Other  diabetes (glu 141), Oral Hypoglycemic Agents  BMI 43  Renal/GU negative Renal ROS     Musculoskeletal   Abdominal   Peds  Hematology   Anesthesia Other Findings   Reproductive/Obstetrics                             Anesthesia Physical Anesthesia Plan  ASA: 3  Anesthesia Plan: General   Post-op Pain Management: Tylenol  PO (pre-op)*   Induction: Intravenous  PONV Risk Score and Plan: 2 and Dexamethasone  and Ondansetron   Airway Management Planned: Oral ETT and Video Laryngoscope Planned  Additional Equipment: None  Intra-op Plan:   Post-operative Plan: Extubation in OR  Informed Consent: I have reviewed the patients History and Physical, chart, labs and discussed the procedure including the risks, benefits and alternatives for the proposed anesthesia with the patient or authorized representative who has indicated his/her understanding and acceptance.     Dental advisory given  Plan Discussed with: CRNA and Surgeon  Anesthesia Plan Comments: (PAT note by Rudy Costain, PA-C: 53 yo female  with pertinent hx including current smoker, HTN, hiatal hernia, PUD,  GERD on PPI, NIDDM2,   Pt had prior cardiology eval in 2020 for c/o palpitations. Event monitor showed primarily sinus rhythm ranging from sinus bradycardia to sinus tachycardia.  No evidence is SVT, atrial fibrillation, pauses, ventricular tachycardia, heart blocks. Did have some PVCs. PRN followup recommended. Recent echo 01/2023 showed EF 60-65%, normal RV function, no significant valvular abnormalities.  Hx of prior C6-7 ACDF. Cervical MRI 01/2023 showed disc protrusion at C3-4 with flattening of the ventral cord. Additional disc protrusions with cord contact at C2-3 and C4-5.  No formal diagnosis of OSA but BMI of 43 and STOP-BANG score of 5 raises concern.  Pt reports she does not have a glucometer and does not track BG. CBG at PAT was 229. Last A1c 6.4 on 11/06/23.  PCP clearance from Dr. Cleola Dach dated 11/06/23 states pt is moderate risk.   CMP and CBC 11/06/23 reviewed, unremarkable.   EKG 11/06/23: Sinus Rhythm. Rate 97. Nonspecific T-abnormality.  TTE 01/31/23: 1. Left ventricular ejection fraction, by estimation, is 60 to 65%. The  left ventricle has normal function. The left ventricle has no regional  wall motion abnormalities. Left ventricular diastolic parameters were  normal.  2. Right ventricular systolic function is normal. The right ventricular  size is normal.  3. The mitral valve is grossly normal. No evidence of mitral valve  regurgitation.  4. The aortic valve is tricuspid. Aortic valve regurgitation is not  visualized. No aortic stenosis is present.  5. The inferior vena cava is normal in  size with greater than 50%  respiratory variability, suggesting right atrial pressure of 3 mmHg.   Comparison(s): No significant change from prior study.   )        Anesthesia Quick Evaluation

## 2023-11-28 DIAGNOSIS — E1142 Type 2 diabetes mellitus with diabetic polyneuropathy: Secondary | ICD-10-CM | POA: Diagnosis not present

## 2023-11-28 DIAGNOSIS — M4722 Other spondylosis with radiculopathy, cervical region: Secondary | ICD-10-CM | POA: Diagnosis not present

## 2023-11-28 DIAGNOSIS — F411 Generalized anxiety disorder: Secondary | ICD-10-CM

## 2023-11-28 DIAGNOSIS — G2581 Restless legs syndrome: Secondary | ICD-10-CM

## 2023-11-28 DIAGNOSIS — J410 Simple chronic bronchitis: Secondary | ICD-10-CM

## 2023-11-28 DIAGNOSIS — E1169 Type 2 diabetes mellitus with other specified complication: Secondary | ICD-10-CM | POA: Diagnosis not present

## 2023-11-28 DIAGNOSIS — G8929 Other chronic pain: Secondary | ICD-10-CM

## 2023-11-28 DIAGNOSIS — K219 Gastro-esophageal reflux disease without esophagitis: Secondary | ICD-10-CM

## 2023-11-28 DIAGNOSIS — H9191 Unspecified hearing loss, right ear: Secondary | ICD-10-CM

## 2023-11-28 DIAGNOSIS — M51379 Other intervertebral disc degeneration, lumbosacral region without mention of lumbar back pain or lower extremity pain: Secondary | ICD-10-CM | POA: Diagnosis not present

## 2023-11-28 DIAGNOSIS — S14129D Central cord syndrome at unspecified level of cervical spinal cord, subsequent encounter: Secondary | ICD-10-CM

## 2023-11-28 DIAGNOSIS — I10 Essential (primary) hypertension: Secondary | ICD-10-CM

## 2023-12-03 ENCOUNTER — Inpatient Hospital Stay (HOSPITAL_COMMUNITY)

## 2023-12-03 ENCOUNTER — Encounter (HOSPITAL_COMMUNITY)
Admission: RE | Disposition: A | Discharge status: Home / Self Care | Payer: Self-pay | Source: Home / Self Care | Attending: Neurosurgery

## 2023-12-03 ENCOUNTER — Other Ambulatory Visit: Payer: Self-pay

## 2023-12-03 ENCOUNTER — Encounter (HOSPITAL_COMMUNITY): Payer: Self-pay | Admitting: *Deleted

## 2023-12-03 ENCOUNTER — Inpatient Hospital Stay (HOSPITAL_COMMUNITY): Payer: Self-pay

## 2023-12-03 ENCOUNTER — Inpatient Hospital Stay (HOSPITAL_COMMUNITY): Payer: Self-pay | Admitting: Physician Assistant

## 2023-12-03 ENCOUNTER — Inpatient Hospital Stay (HOSPITAL_COMMUNITY)
Admission: RE | Admit: 2023-12-03 | Discharge: 2023-12-04 | DRG: 472 | Disposition: A | Discharge status: Home / Self Care | Attending: Neurosurgery | Admitting: Neurosurgery

## 2023-12-03 DIAGNOSIS — Z881 Allergy status to other antibiotic agents status: Secondary | ICD-10-CM

## 2023-12-03 DIAGNOSIS — Z886 Allergy status to analgesic agent status: Secondary | ICD-10-CM

## 2023-12-03 DIAGNOSIS — E119 Type 2 diabetes mellitus without complications: Secondary | ICD-10-CM

## 2023-12-03 DIAGNOSIS — F17210 Nicotine dependence, cigarettes, uncomplicated: Secondary | ICD-10-CM | POA: Diagnosis present

## 2023-12-03 DIAGNOSIS — M19042 Primary osteoarthritis, left hand: Secondary | ICD-10-CM | POA: Diagnosis present

## 2023-12-03 DIAGNOSIS — I10 Essential (primary) hypertension: Secondary | ICD-10-CM | POA: Diagnosis present

## 2023-12-03 DIAGNOSIS — K219 Gastro-esophageal reflux disease without esophagitis: Secondary | ICD-10-CM | POA: Diagnosis present

## 2023-12-03 DIAGNOSIS — M19071 Primary osteoarthritis, right ankle and foot: Secondary | ICD-10-CM | POA: Diagnosis present

## 2023-12-03 DIAGNOSIS — R531 Weakness: Secondary | ICD-10-CM | POA: Diagnosis present

## 2023-12-03 DIAGNOSIS — M4712 Other spondylosis with myelopathy, cervical region: Secondary | ICD-10-CM | POA: Diagnosis present

## 2023-12-03 DIAGNOSIS — M2578 Osteophyte, vertebrae: Secondary | ICD-10-CM | POA: Diagnosis present

## 2023-12-03 DIAGNOSIS — Z801 Family history of malignant neoplasm of trachea, bronchus and lung: Secondary | ICD-10-CM

## 2023-12-03 DIAGNOSIS — G2581 Restless legs syndrome: Secondary | ICD-10-CM | POA: Diagnosis present

## 2023-12-03 DIAGNOSIS — E1169 Type 2 diabetes mellitus with other specified complication: Secondary | ICD-10-CM | POA: Diagnosis present

## 2023-12-03 DIAGNOSIS — M4802 Spinal stenosis, cervical region: Secondary | ICD-10-CM | POA: Diagnosis present

## 2023-12-03 DIAGNOSIS — M5106 Intervertebral disc disorders with myelopathy, lumbar region: Secondary | ICD-10-CM | POA: Diagnosis present

## 2023-12-03 DIAGNOSIS — R292 Abnormal reflex: Secondary | ICD-10-CM | POA: Diagnosis present

## 2023-12-03 DIAGNOSIS — Z7984 Long term (current) use of oral hypoglycemic drugs: Secondary | ICD-10-CM

## 2023-12-03 DIAGNOSIS — M19041 Primary osteoarthritis, right hand: Secondary | ICD-10-CM | POA: Diagnosis present

## 2023-12-03 DIAGNOSIS — E782 Mixed hyperlipidemia: Secondary | ICD-10-CM | POA: Diagnosis present

## 2023-12-03 DIAGNOSIS — J449 Chronic obstructive pulmonary disease, unspecified: Secondary | ICD-10-CM

## 2023-12-03 DIAGNOSIS — M4722 Other spondylosis with radiculopathy, cervical region: Secondary | ICD-10-CM | POA: Diagnosis present

## 2023-12-03 DIAGNOSIS — Z807 Family history of other malignant neoplasms of lymphoid, hematopoietic and related tissues: Secondary | ICD-10-CM | POA: Diagnosis not present

## 2023-12-03 DIAGNOSIS — Z79899 Other long term (current) drug therapy: Secondary | ICD-10-CM

## 2023-12-03 DIAGNOSIS — E1140 Type 2 diabetes mellitus with diabetic neuropathy, unspecified: Secondary | ICD-10-CM | POA: Diagnosis present

## 2023-12-03 DIAGNOSIS — Z833 Family history of diabetes mellitus: Secondary | ICD-10-CM | POA: Diagnosis not present

## 2023-12-03 DIAGNOSIS — Z825 Family history of asthma and other chronic lower respiratory diseases: Secondary | ICD-10-CM | POA: Diagnosis not present

## 2023-12-03 DIAGNOSIS — F411 Generalized anxiety disorder: Secondary | ICD-10-CM | POA: Diagnosis present

## 2023-12-03 DIAGNOSIS — G959 Disease of spinal cord, unspecified: Secondary | ICD-10-CM

## 2023-12-03 DIAGNOSIS — Z8711 Personal history of peptic ulcer disease: Secondary | ICD-10-CM

## 2023-12-03 DIAGNOSIS — Z8249 Family history of ischemic heart disease and other diseases of the circulatory system: Secondary | ICD-10-CM

## 2023-12-03 DIAGNOSIS — Z6841 Body Mass Index (BMI) 40.0 and over, adult: Secondary | ICD-10-CM | POA: Diagnosis not present

## 2023-12-03 DIAGNOSIS — Z8261 Family history of arthritis: Secondary | ICD-10-CM

## 2023-12-03 HISTORY — PX: ANTERIOR CERVICAL DECOMP/DISCECTOMY FUSION: SHX1161

## 2023-12-03 LAB — GLUCOSE, CAPILLARY
Glucose-Capillary: 165 mg/dL — ABNORMAL HIGH (ref 70–99)
Glucose-Capillary: 141 mg/dL — ABNORMAL HIGH (ref 70–99)
Glucose-Capillary: 113 mg/dL — ABNORMAL HIGH (ref 70–99)
Glucose-Capillary: 173 mg/dL — ABNORMAL HIGH (ref 70–99)
Glucose-Capillary: 208 mg/dL — ABNORMAL HIGH (ref 70–99)

## 2023-12-03 LAB — ABO/RH: ABO/RH(D): O NEG

## 2023-12-03 SURGERY — ANTERIOR CERVICAL DECOMPRESSION/DISCECTOMY FUSION 3 LEVELS
Anesthesia: General

## 2023-12-03 MED ORDER — ACETAMINOPHEN 500 MG PO TABS
1000.0000 mg | ORAL_TABLET | Freq: Once | ORAL | Status: AC
  Administered 2023-12-03: 1000 mg via ORAL
  Filled 2023-12-03: qty 2

## 2023-12-03 MED ORDER — FENTANYL CITRATE (PF) 100 MCG/2ML IJ SOLN: 50.0000 ug | Freq: Once | INTRAMUSCULAR | Status: AC

## 2023-12-03 MED ORDER — INSULIN ASPART 100 UNIT/ML IJ SOLN
0.0000 [IU] | INTRAMUSCULAR | Status: DC | PRN
  Administered 2023-12-03: 2 [IU] via SUBCUTANEOUS
  Filled 2023-12-03: qty 1

## 2023-12-03 MED ORDER — LACTATED RINGERS IV SOLN: INTRAVENOUS | Status: DC

## 2023-12-03 MED ORDER — METHOCARBAMOL 500 MG PO TABS
500.0000 mg | ORAL_TABLET | Freq: Four times a day (QID) | ORAL | Status: DC | PRN
  Administered 2023-12-03 – 2023-12-04 (×2): 500 mg via ORAL
  Filled 2023-12-03 (×2): qty 1

## 2023-12-03 MED ORDER — ALBUTEROL SULFATE HFA 108 (90 BASE) MCG/ACT IN AERS
INHALATION_SPRAY | RESPIRATORY_TRACT | Status: DC | PRN
  Administered 2023-12-03: 8 via RESPIRATORY_TRACT

## 2023-12-03 MED ORDER — HYDROMORPHONE HCL 1 MG/ML IJ SOLN
INTRAMUSCULAR | Status: AC
  Filled 2023-12-03: qty 1

## 2023-12-03 MED ORDER — SODIUM CHLORIDE 0.9% FLUSH: 3.0000 mL | INTRAVENOUS | Status: DC | PRN

## 2023-12-03 MED ORDER — ROCURONIUM BROMIDE 10 MG/ML (PF) SYRINGE
PREFILLED_SYRINGE | INTRAVENOUS | Status: AC
  Filled 2023-12-03: qty 10

## 2023-12-03 MED ORDER — HYDROMORPHONE HCL 1 MG/ML IJ SOLN
0.2500 mg | INTRAMUSCULAR | Status: DC | PRN
Start: 2023-12-03 — End: 2023-12-03

## 2023-12-03 MED ORDER — FENTANYL CITRATE (PF) 250 MCG/5ML IJ SOLN
INTRAMUSCULAR | Status: AC
  Filled 2023-12-03: qty 5

## 2023-12-03 MED ORDER — PANTOPRAZOLE SODIUM 40 MG PO TBEC
40.0000 mg | DELAYED_RELEASE_TABLET | Freq: Every day | ORAL | Status: DC
  Administered 2023-12-04: 40 mg via ORAL
  Filled 2023-12-03: qty 1

## 2023-12-03 MED ORDER — ONDANSETRON HCL 4 MG PO TABS: 4.0000 mg | ORAL_TABLET | Freq: Four times a day (QID) | ORAL | Status: DC | PRN

## 2023-12-03 MED ORDER — PROPOFOL 10 MG/ML IV BOLUS
INTRAVENOUS | Status: AC
  Filled 2023-12-03: qty 20

## 2023-12-03 MED ORDER — LABETALOL HCL 5 MG/ML IV SOLN
5.0000 mg | INTRAVENOUS | Status: DC | PRN
  Administered 2023-12-03 (×2): 5 mg via INTRAVENOUS

## 2023-12-03 MED ORDER — ROSUVASTATIN CALCIUM 5 MG PO TABS
5.0000 mg | ORAL_TABLET | Freq: Every day | ORAL | Status: DC
  Administered 2023-12-04: 5 mg via ORAL
  Filled 2023-12-03: qty 1

## 2023-12-03 MED ORDER — ALPRAZOLAM 0.5 MG PO TABS: 0.5000 mg | ORAL_TABLET | Freq: Every day | ORAL | Status: DC

## 2023-12-03 MED ORDER — FENTANYL CITRATE (PF) 100 MCG/2ML IJ SOLN
INTRAMUSCULAR | Status: AC
Start: 2023-12-03 — End: 2023-12-03
  Administered 2023-12-03: 50 ug via INTRAVENOUS
  Filled 2023-12-03: qty 2

## 2023-12-03 MED ORDER — VANCOMYCIN HCL 1500 MG/300ML IV SOLN
1500.0000 mg | INTRAVENOUS | Status: AC
  Administered 2023-12-03: 1500 mg via INTRAVENOUS
  Filled 2023-12-03: qty 300

## 2023-12-03 MED ORDER — ROCURONIUM BROMIDE 10 MG/ML (PF) SYRINGE
PREFILLED_SYRINGE | INTRAVENOUS | Status: AC
Start: 2023-12-03 — End: ?
  Filled 2023-12-03: qty 10

## 2023-12-03 MED ORDER — ALBUTEROL SULFATE HFA 108 (90 BASE) MCG/ACT IN AERS
INHALATION_SPRAY | RESPIRATORY_TRACT | Status: AC
  Filled 2023-12-03: qty 6.7

## 2023-12-03 MED ORDER — ACETAMINOPHEN 650 MG RE SUPP: 650.0000 mg | RECTAL | Status: DC | PRN

## 2023-12-03 MED ORDER — ONDANSETRON HCL 4 MG/2ML IJ SOLN
INTRAMUSCULAR | Status: AC
  Filled 2023-12-03: qty 2

## 2023-12-03 MED ORDER — CHLORHEXIDINE GLUCONATE 0.12 % MT SOLN
15.0000 mL | Freq: Once | OROMUCOSAL | Status: AC
  Administered 2023-12-03: 15 mL via OROMUCOSAL
  Filled 2023-12-03: qty 15

## 2023-12-03 MED ORDER — POLYETHYLENE GLYCOL 3350 17 G PO PACK: 17.0000 g | PACK | Freq: Every day | ORAL | Status: DC | PRN

## 2023-12-03 MED ORDER — BENZONATATE 100 MG PO CAPS: 200.0000 mg | ORAL_CAPSULE | Freq: Two times a day (BID) | ORAL | Status: DC | PRN

## 2023-12-03 MED ORDER — DEXAMETHASONE SODIUM PHOSPHATE 10 MG/ML IJ SOLN
INTRAMUSCULAR | Status: AC
Start: 2023-12-03 — End: ?
  Filled 2023-12-03: qty 1

## 2023-12-03 MED ORDER — LIDOCAINE 2% (20 MG/ML) 5 ML SYRINGE
INTRAMUSCULAR | Status: AC
  Filled 2023-12-03: qty 5

## 2023-12-03 MED ORDER — DEXAMETHASONE 4 MG PO TABS
2.0000 mg | ORAL_TABLET | Freq: Three times a day (TID) | ORAL | Status: DC
  Administered 2023-12-03 – 2023-12-04 (×2): 2 mg via ORAL
  Filled 2023-12-03 (×2): qty 1

## 2023-12-03 MED ORDER — PROPOFOL 10 MG/ML IV BOLUS
INTRAVENOUS | Status: DC | PRN
  Administered 2023-12-03: 150 mg via INTRAVENOUS

## 2023-12-03 MED ORDER — ONDANSETRON HCL 4 MG/2ML IJ SOLN: 4.0000 mg | Freq: Four times a day (QID) | INTRAMUSCULAR | Status: DC | PRN

## 2023-12-03 MED ORDER — OXYCODONE HCL 5 MG PO TABS: 5.0000 mg | ORAL_TABLET | Freq: Once | ORAL | Status: DC | PRN

## 2023-12-03 MED ORDER — LABETALOL HCL 5 MG/ML IV SOLN
INTRAVENOUS | Status: AC
  Filled 2023-12-03: qty 4

## 2023-12-03 MED ORDER — MEPERIDINE HCL 25 MG/ML IJ SOLN: 6.2500 mg | INTRAMUSCULAR | Status: DC | PRN

## 2023-12-03 MED ORDER — BUPIVACAINE HCL (PF) 0.5 % IJ SOLN
INTRAMUSCULAR | Status: AC
  Filled 2023-12-03: qty 30

## 2023-12-03 MED ORDER — OXYCODONE HCL 5 MG PO TABS
10.0000 mg | ORAL_TABLET | ORAL | Status: DC | PRN
  Administered 2023-12-03 – 2023-12-04 (×4): 10 mg via ORAL
  Filled 2023-12-03 (×4): qty 2

## 2023-12-03 MED ORDER — ACETAMINOPHEN 325 MG PO TABS: 650.0000 mg | ORAL_TABLET | ORAL | Status: DC | PRN

## 2023-12-03 MED ORDER — LIDOCAINE-EPINEPHRINE 1 %-1:100000 IJ SOLN
INTRAMUSCULAR | Status: AC
  Filled 2023-12-03: qty 1

## 2023-12-03 MED ORDER — THROMBIN 5000 UNITS EX KIT
PACK | CUTANEOUS | Status: AC
  Filled 2023-12-03: qty 1

## 2023-12-03 MED ORDER — DEXAMETHASONE SODIUM PHOSPHATE 10 MG/ML IJ SOLN
INTRAMUSCULAR | Status: DC | PRN
  Administered 2023-12-03: 10 mg via INTRAVENOUS

## 2023-12-03 MED ORDER — ESMOLOL HCL 100 MG/10ML IV SOLN
INTRAVENOUS | Status: DC | PRN
  Administered 2023-12-03: 20 ug via INTRAVENOUS

## 2023-12-03 MED ORDER — THROMBIN 5000 UNITS EX SOLR
OROMUCOSAL | Status: DC | PRN
  Administered 2023-12-03: 5 mL via TOPICAL

## 2023-12-03 MED ORDER — ALBUMIN HUMAN 5 % IV SOLN: INTRAVENOUS | Status: DC | PRN

## 2023-12-03 MED ORDER — DEXAMETHASONE SODIUM PHOSPHATE 10 MG/ML IJ SOLN
INTRAMUSCULAR | Status: AC
  Filled 2023-12-03: qty 1

## 2023-12-03 MED ORDER — OXYCODONE HCL 5 MG PO TABS: 5.0000 mg | ORAL_TABLET | ORAL | Status: DC | PRN

## 2023-12-03 MED ORDER — DOCUSATE SODIUM 100 MG PO CAPS
100.0000 mg | ORAL_CAPSULE | Freq: Two times a day (BID) | ORAL | Status: DC
  Administered 2023-12-03 – 2023-12-04 (×2): 100 mg via ORAL
  Filled 2023-12-03 (×2): qty 1

## 2023-12-03 MED ORDER — ALBUMIN HUMAN 5 % IV SOLN
12.5000 g | Freq: Once | INTRAVENOUS | Status: AC
  Administered 2023-12-03: 12.5 g via INTRAVENOUS

## 2023-12-03 MED ORDER — MENTHOL 3 MG MT LOZG
1.0000 | LOZENGE | OROMUCOSAL | Status: DC | PRN
  Administered 2023-12-04: 3 mg via ORAL
  Filled 2023-12-03: qty 9

## 2023-12-03 MED ORDER — ONDANSETRON HCL 4 MG/2ML IJ SOLN
INTRAMUSCULAR | Status: DC | PRN
  Administered 2023-12-03: 4 mg via INTRAVENOUS

## 2023-12-03 MED ORDER — CHLORHEXIDINE GLUCONATE CLOTH 2 % EX PADS
6.0000 | MEDICATED_PAD | Freq: Once | CUTANEOUS | Status: DC
Start: 2023-12-03 — End: 2023-12-03

## 2023-12-03 MED ORDER — FENTANYL CITRATE (PF) 250 MCG/5ML IJ SOLN
INTRAMUSCULAR | Status: DC | PRN
Start: 2023-12-03 — End: 2023-12-03
  Administered 2023-12-03: 100 ug via INTRAVENOUS
  Administered 2023-12-03 (×2): 50 ug via INTRAVENOUS
  Administered 2023-12-03: 250 ug via INTRAVENOUS

## 2023-12-03 MED ORDER — MIDAZOLAM HCL 2 MG/2ML IJ SOLN: 0.5000 mg | Freq: Once | INTRAMUSCULAR | Status: DC | PRN

## 2023-12-03 MED ORDER — METFORMIN HCL ER 500 MG PO TB24
500.0000 mg | ORAL_TABLET | Freq: Every day | ORAL | Status: DC
  Administered 2023-12-04: 500 mg via ORAL
  Filled 2023-12-03: qty 1

## 2023-12-03 MED ORDER — INSULIN ASPART 100 UNIT/ML IJ SOLN
0.0000 [IU] | Freq: Every day | INTRAMUSCULAR | Status: DC
  Administered 2023-12-03: 2 [IU] via SUBCUTANEOUS

## 2023-12-03 MED ORDER — INSULIN ASPART 100 UNIT/ML IJ SOLN
0.0000 [IU] | Freq: Three times a day (TID) | INTRAMUSCULAR | Status: DC
  Administered 2023-12-04: 4 [IU] via SUBCUTANEOUS

## 2023-12-03 MED ORDER — MIDAZOLAM HCL 2 MG/2ML IJ SOLN
INTRAMUSCULAR | Status: AC
  Filled 2023-12-03: qty 2

## 2023-12-03 MED ORDER — 0.9 % SODIUM CHLORIDE (POUR BTL) OPTIME
TOPICAL | Status: DC | PRN
  Administered 2023-12-03: 1000 mL

## 2023-12-03 MED ORDER — HYDROMORPHONE HCL 1 MG/ML IJ SOLN
0.5000 mg | INTRAMUSCULAR | Status: DC | PRN
  Administered 2023-12-03: 0.5 mg via INTRAVENOUS
  Filled 2023-12-03: qty 0.5

## 2023-12-03 MED ORDER — OXYCODONE HCL 5 MG/5ML PO SOLN: 5.0000 mg | Freq: Once | ORAL | Status: DC | PRN

## 2023-12-03 MED ORDER — ORAL CARE MOUTH RINSE: 15.0000 mL | Freq: Once | OROMUCOSAL | Status: AC

## 2023-12-03 MED ORDER — ALBUMIN HUMAN 5 % IV SOLN
INTRAVENOUS | Status: AC
  Filled 2023-12-03: qty 250

## 2023-12-03 MED ORDER — VANCOMYCIN HCL IN DEXTROSE 1-5 GM/200ML-% IV SOLN
1000.0000 mg | Freq: Two times a day (BID) | INTRAVENOUS | Status: DC
  Administered 2023-12-03: 1000 mg via INTRAVENOUS
  Filled 2023-12-03: qty 200

## 2023-12-03 MED ORDER — HYDROMORPHONE HCL 1 MG/ML IJ SOLN
INTRAMUSCULAR | Status: AC
  Filled 2023-12-03: qty 0.5

## 2023-12-03 MED ORDER — PHENYLEPHRINE HCL-NACL 20-0.9 MG/250ML-% IV SOLN
INTRAVENOUS | Status: DC | PRN
  Administered 2023-12-03: 50 ug/min via INTRAVENOUS

## 2023-12-03 MED ORDER — PHENOL 1.4 % MT LIQD: 1.0000 | OROMUCOSAL | Status: DC | PRN

## 2023-12-03 MED ORDER — BUPIVACAINE HCL 0.5 % IJ SOLN
INTRAMUSCULAR | Status: DC | PRN
  Administered 2023-12-03: 3 mL

## 2023-12-03 MED ORDER — ROCURONIUM BROMIDE 10 MG/ML (PF) SYRINGE
PREFILLED_SYRINGE | INTRAVENOUS | Status: DC | PRN
  Administered 2023-12-03: 30 mg via INTRAVENOUS
  Administered 2023-12-03 (×2): 10 mg via INTRAVENOUS
  Administered 2023-12-03: 70 mg via INTRAVENOUS

## 2023-12-03 MED ORDER — HYDROCHLOROTHIAZIDE 25 MG PO TABS
25.0000 mg | ORAL_TABLET | Freq: Every day | ORAL | Status: DC
  Administered 2023-12-03 – 2023-12-04 (×2): 25 mg via ORAL
  Filled 2023-12-03 (×2): qty 1

## 2023-12-03 MED ORDER — MIDAZOLAM HCL 2 MG/2ML IJ SOLN
INTRAMUSCULAR | Status: DC | PRN
  Administered 2023-12-03: 2 mg via INTRAVENOUS

## 2023-12-03 MED ORDER — FENTANYL CITRATE (PF) 100 MCG/2ML IJ SOLN
50.0000 ug | Freq: Once | INTRAMUSCULAR | Status: AC
  Administered 2023-12-03: 50 ug via INTRAVENOUS

## 2023-12-03 MED ORDER — EPHEDRINE SULFATE-NACL 50-0.9 MG/10ML-% IV SOSY
PREFILLED_SYRINGE | INTRAVENOUS | Status: DC | PRN
  Administered 2023-12-03 (×2): 5 mg via INTRAVENOUS

## 2023-12-03 MED ORDER — SUGAMMADEX SODIUM 200 MG/2ML IV SOLN
INTRAVENOUS | Status: DC | PRN
  Administered 2023-12-03: 200 mg via INTRAVENOUS

## 2023-12-03 MED ORDER — PREGABALIN 75 MG PO CAPS
75.0000 mg | ORAL_CAPSULE | Freq: Two times a day (BID) | ORAL | Status: DC
  Administered 2023-12-03 – 2023-12-04 (×2): 75 mg via ORAL
  Filled 2023-12-03 (×2): qty 1

## 2023-12-03 MED ORDER — FLEET ENEMA RE ENEM: 1.0000 | ENEMA | Freq: Once | RECTAL | Status: DC | PRN

## 2023-12-03 MED ORDER — SODIUM CHLORIDE 0.9% FLUSH: 3.0000 mL | Freq: Two times a day (BID) | INTRAVENOUS | Status: DC

## 2023-12-03 MED ORDER — SODIUM CHLORIDE 0.9 % IV SOLN: 250.0000 mL | INTRAVENOUS | Status: DC

## 2023-12-03 MED ORDER — BUSPIRONE HCL 15 MG PO TABS
15.0000 mg | ORAL_TABLET | Freq: Every day | ORAL | Status: DC
  Administered 2023-12-04: 15 mg via ORAL
  Filled 2023-12-03: qty 1

## 2023-12-03 MED ORDER — LIDOCAINE-EPINEPHRINE 1 %-1:100000 IJ SOLN
INTRAMUSCULAR | Status: DC | PRN
  Administered 2023-12-03: 3 mL

## 2023-12-03 MED ORDER — EPHEDRINE 5 MG/ML INJ
INTRAVENOUS | Status: AC
  Filled 2023-12-03: qty 5

## 2023-12-03 MED ORDER — CHLORHEXIDINE GLUCONATE CLOTH 2 % EX PADS: 6.0000 | MEDICATED_PAD | Freq: Once | CUTANEOUS | Status: DC

## 2023-12-03 MED ORDER — METHOCARBAMOL 1000 MG/10ML IJ SOLN: 500.0000 mg | Freq: Four times a day (QID) | INTRAMUSCULAR | Status: DC | PRN

## 2023-12-03 MED ORDER — HYDROMORPHONE HCL 1 MG/ML IJ SOLN
INTRAMUSCULAR | Status: DC | PRN
Start: 2023-12-03 — End: 2023-12-03
  Administered 2023-12-03 (×3): .5 mg via INTRAVENOUS

## 2023-12-03 MED ORDER — LIDOCAINE 2% (20 MG/ML) 5 ML SYRINGE
INTRAMUSCULAR | Status: DC | PRN
  Administered 2023-12-03: 20 mg via INTRAVENOUS

## 2023-12-03 MED ORDER — PHENYLEPHRINE 80 MCG/ML (10ML) SYRINGE FOR IV PUSH (FOR BLOOD PRESSURE SUPPORT)
PREFILLED_SYRINGE | INTRAVENOUS | Status: DC | PRN
  Administered 2023-12-03 (×3): 80 ug via INTRAVENOUS
  Administered 2023-12-03: 160 ug via INTRAVENOUS

## 2023-12-03 SURGICAL SUPPLY — 59 items
BAG COUNTER SPONGE SURGICOUNT (BAG) ×1 IMPLANT
BAND RUBBER #18 3X1/16 STRL (MISCELLANEOUS) ×2 IMPLANT
BASKET BONE COLLECTION (BASKET) IMPLANT
BENZOIN TINCTURE PRP APPL 2/3 (GAUZE/BANDAGES/DRESSINGS) ×1 IMPLANT
BIT DRILL NEURO 2X3.1 SFT TUCH (MISCELLANEOUS) ×1 IMPLANT
BLADE CLIPPER SURG (BLADE) IMPLANT
BUR MATCHSTICK NEURO 3.0 LAGG (BURR) ×1 IMPLANT
CAGE SPNL 6D 14XMED 16X7X (Cage) IMPLANT
CANISTER SUCTION 3000ML PPV (SUCTIONS) ×1 IMPLANT
DEVICE ENDSKLTN IMPL 16X14X7X6 (Cage) IMPLANT
DRAPE C-ARM 42X72 X-RAY (DRAPES) ×2 IMPLANT
DRAPE HALF SHEET 40X57 (DRAPES) IMPLANT
DRAPE LAPAROTOMY 100X72 PEDS (DRAPES) ×1 IMPLANT
DRAPE MICROSCOPE SLANT 54X150 (MISCELLANEOUS) ×1 IMPLANT
DRESSING MEPILEX FLEX 4X4 (GAUZE/BANDAGES/DRESSINGS) ×1 IMPLANT
DRSG OPSITE 4X5.5 SM (GAUZE/BANDAGES/DRESSINGS) ×2 IMPLANT
DRSG OPSITE POSTOP 4X6 (GAUZE/BANDAGES/DRESSINGS) IMPLANT
DURAPREP 26ML APPLICATOR (WOUND CARE) ×1 IMPLANT
ELECT COATED BLADE 2.86 ST (ELECTRODE) ×1 IMPLANT
ELECTRODE REM PT RTRN 9FT ADLT (ELECTROSURGICAL) ×1 IMPLANT
GAUZE 4X4 16PLY ~~LOC~~+RFID DBL (SPONGE) IMPLANT
GLOVE BIO SURGEON STRL SZ7 (GLOVE) ×2 IMPLANT
GLOVE BIOGEL PI IND STRL 6.5 (GLOVE) IMPLANT
GLOVE BIOGEL PI IND STRL 7.5 (GLOVE) ×2 IMPLANT
GLOVE ECLIPSE 7.5 STRL STRAW (GLOVE) ×1 IMPLANT
GLOVE SURG SS PI 6.5 STRL IVOR (GLOVE) IMPLANT
GOWN STRL REUS W/ TWL LRG LVL3 (GOWN DISPOSABLE) ×2 IMPLANT
GOWN STRL REUS W/ TWL XL LVL3 (GOWN DISPOSABLE) ×1 IMPLANT
GOWN STRL REUS W/TWL 2XL LVL3 (GOWN DISPOSABLE) IMPLANT
HEMOSTAT POWDER KIT SURGIFOAM (HEMOSTASIS) ×1 IMPLANT
KIT BASIN OR (CUSTOM PROCEDURE TRAY) ×1 IMPLANT
KIT TURNOVER KIT B (KITS) ×1 IMPLANT
NDL HYPO 22X1.5 SAFETY MO (MISCELLANEOUS) ×1 IMPLANT
NDL SPNL 22GX3.5 QUINCKE BK (NEEDLE) ×1 IMPLANT
NEEDLE HYPO 22X1.5 SAFETY MO (MISCELLANEOUS) ×1 IMPLANT
NEEDLE SPNL 22GX3.5 QUINCKE BK (NEEDLE) ×1 IMPLANT
NS IRRIG 1000ML POUR BTL (IV SOLUTION) ×1 IMPLANT
PACK LAMINECTOMY NEURO (CUSTOM PROCEDURE TRAY) ×1 IMPLANT
PAD ARMBOARD POSITIONER FOAM (MISCELLANEOUS) ×3 IMPLANT
PATTIES SURGICAL .5 X.5 (GAUZE/BANDAGES/DRESSINGS) ×1 IMPLANT
PATTIES SURGICAL 1X1 (DISPOSABLE) ×1 IMPLANT
PIN DISTRACTION TAP 12 DISP (PIN) ×2 IMPLANT
PLATE 39MM (Plate) IMPLANT
PLATE ZEVO 1LVL 17MM (Plate) IMPLANT
PUTTY DBF 3CC CORTICAL FIBERS (Putty) IMPLANT
SCREW 3.5 SELFDRILL 15MM VARI (Screw) IMPLANT
SPIKE FLUID TRANSFER (MISCELLANEOUS) ×1 IMPLANT
SPONGE INTESTINAL PEANUT (DISPOSABLE) ×1 IMPLANT
STAPLER SKIN PROX 35W (STAPLE) IMPLANT
STRIP CLOSURE SKIN 1/2X4 (GAUZE/BANDAGES/DRESSINGS) ×1 IMPLANT
SUT MNCRL AB 4-0 PS2 18 (SUTURE) ×1 IMPLANT
SUT SILK 2 0 TIES 10X30 (SUTURE) IMPLANT
SUT VIC AB 3-0 SH 8-18 (SUTURE) ×1 IMPLANT
SYR 30ML SLIP (SYRINGE) ×1 IMPLANT
TAPE CLOTH 3X10 TAN LF (GAUZE/BANDAGES/DRESSINGS) ×1 IMPLANT
TIP KERRISON THIN FOOTPLATE 2M (MISCELLANEOUS) ×1 IMPLANT
TOWEL GREEN STERILE (TOWEL DISPOSABLE) ×1 IMPLANT
TOWEL GREEN STERILE FF (TOWEL DISPOSABLE) ×1 IMPLANT
WATER STERILE IRR 1000ML POUR (IV SOLUTION) ×1 IMPLANT

## 2023-12-03 NOTE — Op Note (Signed)
 PREOP DIAGNOSIS: Cervical spondylitic myelopathy  POSTOP DIAGNOSIS: Cervical spondylitic myelopathy  PROCEDURE: 1. Arthrodesis C3-4, anterior interbody technique, including Discectomy for decompression of spinal cord and exiting nerve roots with foraminotomies  2. Arthrodesis, additional level C4-5 anterior interbody technique, including Discectomy for decompression of spinal cord and exiting nerve roots with foraminotomies  3. Arthrodesis, additional level C5-6 anterior interbody technique, including Discectomy for decompression of spinal cord and exiting nerve roots with foraminotomies  4. Placement of intervertebral biomechanical device C3-4 5. Placement of intervertebral biomechanical device C4-5 6. Placement of intervertebral biomechanical device C5-6 7. Placement of anterior instrumentation consisting of interbody plate and screws C3-4-5-6 8. Use of morselized bone allograft  9. Use of intraoperative microscope  SURGEON: Dr. Arvilla Birmingham, MD  ASSISTANT:  Easter Golden, PA.  Please note there were no qualified trainees available to assist with the procedure.  Assistance required for retraction of the visceral structures to allow for safe instrumentation.  ANESTHESIA: General Endotracheal  EBL: 50 ml  IMPLANTS: Medtronic 7 mm medium Titan C cage x 3 39 mm and 17 mm Zevo plate 15 mm screws x 10 DBF  SPECIMENS: None  DRAINS: Medium hemovac  COMPLICATIONS: None immediate  CONDITION: Hemodynamically stable to PACU  HISTORY: 53 y.o. female with morbid obesity (BMI 43), DM, hx of C6-7 ACDF developed progressive cervical spondylitic myelopathy with hyperreflexia, distal UE weakness, and gait difficulty.  She was found to have multilevel degenerative disc disease with stenosis at C3-4, C4-5, and C5-6.    Risks, benefits, alternatives, and expected convalescence were discussed with the patient.  Risks discussed included but were not limited to bleeding, pain, infection,  dysphagia, dysphonia, pseudoarthrosis, hardware failure, adjacent segment disease, CSF leak, neurologic deficits, weakness, numbness, paralysis, coma, and death. After all questions were answered, informed consent was obtained.  PROCEDURE IN DETAIL: The patient was brought to the operating room and transferred to the operative table. After induction of general anesthesia, the patient was positioned on the operative table in the supine position with all pressure points meticulously padded. The skin of the neck was then prepped and draped in the usual sterile fashion.  After timeout was conducted, the skin was infiltrated with local anesthetic. Skin incision was then made sharply and Bovie electrocautery was used to dissect the subcutaneous tissue until the platysma was identified. The platysma was then divided and undermined. The sternocleidomastoid muscle was then identified and, utilizing natural fascial planes in the neck, the prevertebral fascia was identified and the carotid sheath was retracted laterally and the trachea and esophagus retracted medially. Again using fluoroscopy, the C3-4 disc space was identified. Bovie electrocautery was used to dissect in the subperiosteal plane and elevate the bilateral longus coli muscles. There was a very large osteophyte at C5-6 which was completely enveloping the C6-7 plate.  This osteophyte was removed with rongeurs and drill to expose the disc space and the previous anterior cervical plate.  However, the screw heads and locking mechanism appeared warped from the chronic osteophyte.  As risks of drilling out the rest of the plate and attempting removal appeared to outweigh the benefits, we elected to instead instrument the C5-6 disc space with an independent small plate.  Self-retaining retractors were then placed. Caspar distraction pins were placed in the adjacent bodies to allow for gentle distraction.  At this point, the microscope was draped and brought into  the field, and the remainder of the case was done under the microscope using microdissecting technique.  The disc space was  incised sharply and combination of high speed drill, curettes, and rongeurs were use to initially complete a discectomy. The high-speed drill was then used to complete discectomy until the posterior annulus was identified and removed and the posterior longitudinal ligament was identified. Using a nerve hook, the PLL was elevated, and Kerrison rongeurs were used to remove the posterior longitudinal ligament and the ventral thecal sac was identified.  Using a combination of curettes and rongeurs, complete decompression of the thecal sac and exiting nerve roots at this level was completed, and verified with easy passage of micro-nerve hook centrally and in the bilateral foramina.  Having completed our decompression, attention was turned to placement of the intervertebral device. Trial spacers were used to select a size 7 mm graft. This graft was then filled with morcellized allograft, and inserted under live fluoroscopy. A 17 mm anterior cervical plate was then placed, with just enough room over the previous plate to allow screw insertion and fixation into C6.   Attention was then turned to the C4-5 level. Caspar distraction pin was placed in the adjacent body to allow for gentle distraction of the disc space.  In a similar fashion, discectomy was completed initially with curettes and rongeurs, and completed with the drill. The PLL was again identified, elevated and incised. Using Kerrison rongeurs, decompression of the spinal cord and exiting roots was completed and confirmed with a dissector. Trial spacers were used to select a 7 mm graft. This graft was then filled with morcellized allograft, and inserted under live fluoroscopy.  Attention was then turned to the C3-4 level. Caspar distraction pin was placed in the adjacent body to allow for gentle distraction of the disc space.  In a  similar fashion, discectomy was completed initially with curettes and rongeurs, and completed with the drill. The PLL was again identified, elevated and incised. Using Kerrison rongeurs, decompression of the spinal cord and exiting roots was completed and confirmed with a dissector. Trial spacers were used to select a 7 mm graft. This graft was then filled with morcellized allograft, and inserted under live fluoroscopy.  After placement of the intervertebral devices, the caspar pins were removed.  An anterior cervical plate was placed across the interspaces of C3-4 and C4-5 for anterior fixation.  Using a high-speed drill, the cortex of the cervical vertebral bodies was punctured, and screws inserted in the vertebral bodies. Final fluoroscopic images in AP and lateral projections were taken to confirm good hardware placement.  At this point, after all counts were verified to be correct, meticulous hemostasis was secured using a combination of bipolar electrocautery and passive hemostatics.  A medium Hemovac drain was placed in the deep cervical space and tunneled out the skin and secured with a stitch.  The platysma muscle was then closed using interrupted 3-0 Vicryl sutures, and the skin was closed with a 4-0 monocryl in subcuticular fashion followed by steri-strips. Sterile dressings were then applied and the drapes removed.  The patient tolerated the procedure well and was extubated in the room and taken to the postanesthesia care unit in stable condition.  All counts were correct at the end of the procedure.

## 2023-12-03 NOTE — Anesthesia Procedure Notes (Signed)
 Procedure Name: Intubation Date/Time: 12/03/2023 11:30 AM  Performed by: Candance Certain, CRNAPre-anesthesia Checklist: Patient identified, Emergency Drugs available, Suction available and Patient being monitored Patient Re-evaluated:Patient Re-evaluated prior to induction Oxygen Delivery Method: Circle System Utilized Preoxygenation: Pre-oxygenation with 100% oxygen Induction Type: IV induction Ventilation: Mask ventilation without difficulty Laryngoscope Size: Glidescope and 3 Grade View: Grade I Tube type: Oral Tube size: 7.0 mm Number of attempts: 1 Airway Equipment and Method: Stylet and Oral airway Placement Confirmation: ETT inserted through vocal cords under direct vision, positive ETCO2 and breath sounds checked- equal and bilateral Secured at: 20 cm Tube secured with: Tape Dental Injury: Teeth and Oropharynx as per pre-operative assessment

## 2023-12-03 NOTE — Transfer of Care (Signed)
 Immediate Anesthesia Transfer of Care Note  Patient: Brenda Taylor  Procedure(s) Performed: ANTERIOR CERVICAL DECOMPRESSION/DISCECTOMY FUSION CERVICAL THREE-FOUR CERVICAL FOUR-FIVE CERVICAL FIVE-SIX EXPLORE FUSION SIX-SEVEN  Patient Location: PACU  Anesthesia Type:General  Level of Consciousness: awake  Airway & Oxygen Therapy: Patient Spontanous Breathing and Patient connected to face mask oxygen  Post-op Assessment: Report given to RN, Post -op Vital signs reviewed and stable, and Patient moving all extremities  Post vital signs: Reviewed and stable  Last Vitals:  Vitals Value Taken Time  BP 115/55 12/03/23 1545  Temp    Pulse 121 12/03/23 1550  Resp 14 12/03/23 1550  SpO2 82 % 12/03/23 1550  Vitals shown include unfiled device data.  Last Pain:  Vitals:   12/03/23 0942  TempSrc:   PainSc: 8       Patients Stated Pain Goal: 1 (12/03/23 0626)  Complications: No notable events documented.

## 2023-12-03 NOTE — Progress Notes (Signed)
 Pharmacy Antibiotic Note  Brenda Taylor is a 53 y.o. female admitted on 12/03/2023 with surgical prophylaxis.  Pharmacy has been consulted for vancomycin dosing.  Pt received vancomycin 1500mg  IV x1 on 5/13 @1148 . Per consult, continue therapy until directed by MD as drain is in place.  Pt has Scr 0.93 from 11/06/23  Plan: Vancomycin 1g IV q 12 to begin at 2300 -goal trough 10-28mcg/mL F/u levels PRN, LOT per MD, and renal function tomorrow AM   Height: 5\' 7"  (170.2 cm) Weight: 124.3 kg (274 lb) IBW/kg (Calculated) : 61.6  Temp (24hrs), Avg:98.5 F (36.9 C), Min:97.9 F (36.6 C), Max:99.2 F (37.3 C)  No results for input(s): "WBC", "CREATININE", "LATICACIDVEN", "VANCOTROUGH", "VANCOPEAK", "VANCORANDOM", "GENTTROUGH", "GENTPEAK", "GENTRANDOM", "TOBRATROUGH", "TOBRAPEAK", "TOBRARND", "AMIKACINPEAK", "AMIKACINTROU", "AMIKACIN" in the last 168 hours.  CrCl cannot be calculated (Patient's most recent lab result is older than the maximum 21 days allowed.).    Allergies  Allergen Reactions   Amoxicillin Hives   Aspirin Other (See Comments)    Cannot take due to ulcers   Nsaids Other (See Comments)    Cannot take due to ulcers     Antimicrobials this admission: Vanc 5/13>  Dose adjustments this admission:   Microbiology results: 5/5 MRSA neg; MSSA pos  Thank you for allowing pharmacy to be a part of this patient's care.  Oralee Billow, PharmD, BCCCP Clinical Pharmacist 12/03/2023 6:21 PM

## 2023-12-03 NOTE — H&P (Signed)
 CC: cervical myeloradiculopathy  HPI:     Patient is a 53 y.o. female with hx of C6-7 ACDF who c/o neck pain, bilateral shoulders. Feels weakness and numbness is getting worse in her hands. In her left hand, numbness is worse and feels it getting worse. She has significant pain with coughing or sneezing.  She smokes 1 pack/day. PT was doing more harm than good so she was told to stop after 10 weeks.     Patient Active Problem List   Diagnosis Date Noted   Preop examination 11/06/2023   Cervical spondylosis with radiculopathy 04/24/2023   Hot flashes 04/24/2023   DDD (degenerative disc disease), lumbar 02/07/2023   Hospital discharge follow-up 02/07/2023   Syncope 01/27/2023   Central cord syndrome (HCC) 01/27/2023   Allergic conjunctivitis of both eyes 01/02/2023   Meralgia paresthetica of left side 01/01/2023   Type 2 diabetes mellitus with other specified complication (HCC) 11/14/2022   Plantar wart of both feet 11/13/2022   Simple chronic bronchitis (HCC) 11/13/2022   Acute sinusitis 09/13/2022   Sciatica 04/30/2022   Primary osteoarthritis of hands, bilateral 03/06/2022   Gastroesophageal reflux disease without esophagitis 03/06/2022   Restless legs syndrome (RLS) 07/18/2021   Intertrigo 05/30/2021   Early satiety 04/10/2021   Encounter for general adult medical examination with abnormal findings 12/15/2020   Mixed hyperlipidemia 12/15/2020   Vitamin D  deficiency 09/15/2020   Osteoarthritis of right foot 07/05/2020   Right hand weakness 06/28/2020   History of fusion of cervical spine 06/28/2020   Arthritis 06/01/2020   Diabetic neuropathy (HCC) 06/01/2020   Hearing problem of right ear 05/02/2020   Obesity (BMI 30-39.9) 05/02/2020   Tobacco abuse 05/02/2020   Prediabetes 05/02/2020   GAD (generalized anxiety disorder) 05/07/2019   Hypertension 04/23/2019   HNP (herniated nucleus pulposus), lumbar 07/03/2013   Chronic idiopathic constipation 10/31/2012   Past  Medical History:  Diagnosis Date   Anxiety    Arthritis    Chronic pain    DDD (degenerative disc disease)    Depression    Diabetes mellitus without complication (HCC)    GERD (gastroesophageal reflux disease)    HBP (high blood pressure)    Hernia    History of hiatal hernia    Multiple gastric ulcers    Neuromuscular disorder (HCC)    Neuropathy bilateral feet and hands   Rotator cuff syndrome of left shoulder 10/09/2011   Ulcer disease     Past Surgical History:  Procedure Laterality Date   APPENDECTOMY     BACK SURGERY     x5   BIOPSY  05/04/2021   Procedure: BIOPSY;  Surgeon: Vinetta Greening, DO;  Location: [facility];  Service: Endoscopy;;   CARPAL TUNNEL RELEASE     bilateral   CHOLECYSTECTOMY     COLONOSCOPY  10/20/2009   ZOX:WRUEAVW anal canal/left-side diverticula and multiple polyps (hyperplastic). poor prep compromised exam. Next TCS 09/2014.   COLONOSCOPY WITH PROPOFOL  N/A 05/04/2021   Procedure: COLONOSCOPY WITH PROPOFOL ;  Surgeon: Vinetta Greening, DO;  Location: [facility];  Service: Endoscopy;  Laterality: N/A;  9:45am   ESOPHAGOGASTRODUODENOSCOPY  03/06/2010   UJW:JXBJ quadrant distal esophageal/patent tubular esophagus/small HH, antral erosions and ulcerations. Bx negative for persistent H.pylori   ESOPHAGOGASTRODUODENOSCOPY  10/20/2009   YNW:GNFAO HH/prepyloric antral ulcer s/p bx (+h.pylori), ERE   ESOPHAGOGASTRODUODENOSCOPY (EGD) WITH PROPOFOL  N/A 05/04/2021   Procedure: ESOPHAGOGASTRODUODENOSCOPY (EGD) WITH PROPOFOL ;  Surgeon: Vinetta Greening, DO;  Location: [facility];  Service:  Endoscopy;  Laterality: N/A;   INTRAUTERINE DEVICE INSERTION     LEEP N/A 02/14/2021   Procedure: LOOP ELECTROSURGICAL EXCISION PROCEDURE (LEEP);  Surgeon: Ozan, Jennifer, DO;  Location: [facility];  Service: Gynecology;  Laterality: N/A;   MASTECTOMY, PARTIAL Left    x 2   MOUTH SURGERY     All top teeth removed and 6-8 of bottom teeth removed   POLYPECTOMY   05/04/2021   Procedure: POLYPECTOMY;  Surgeon: Vinetta Greening, DO;  Location: [facility];  Service: Endoscopy;;   ROTATOR CUFF REPAIR     right   TUBAL LIGATION      Medications Prior to Admission  Medication Sig Dispense Refill Last Dose/Taking   ALPRAZolam  (XANAX ) 0.5 MG tablet TAKE ONE TABLET BY MOUTH AT BEDTIME AS NEEDED FOR ANXIETY. (Patient taking differently: Take 0.5 mg by mouth at bedtime.) 30 tablet 0 12/02/2023 Bedtime   benzonatate  (TESSALON ) 200 MG capsule Take 1 capsule (200 mg total) by mouth 2 (two) times daily as needed for cough. (Patient taking differently: Take 200 mg by mouth in the morning and at bedtime.) 30 capsule 1 12/03/2023 at  4:00 AM   busPIRone  (BUSPAR ) 15 MG tablet TAKE 1 TABLET BY MOUTH ONCE DAILY. 30 tablet 3 12/03/2023 at  4:00 AM   celecoxib  (CELEBREX ) 200 MG capsule TAKE ONE CAPSULE BY MOUTH ONCE DAILY. 30 capsule 0 Past Week   hydrochlorothiazide  (HYDRODIURIL ) 25 MG tablet TAKE 1 TABLET BY MOUTH ONCE A DAY. 30 tablet 5 12/02/2023   metFORMIN  (GLUCOPHAGE -XR) 500 MG 24 hr tablet TAKE ONE TABLET (=500MG  TOTAL) EVERY DAY WITH BREAKFAST 30 tablet 0 12/02/2023   olopatadine  (PATADAY ) 0.1 % ophthalmic solution Place 1 drop into both eyes 2 (two) times daily. (Patient taking differently: Place 2 drops into both eyes daily as needed for allergies.) 5 mL 1 Past Month   oxyCODONE  (OXY IR/ROXICODONE ) 5 MG immediate release tablet Take 1 tablet (5 mg total) by mouth every 6 (six) hours as needed for moderate pain. 30 tablet 0 12/02/2023 Bedtime   pantoprazole  (PROTONIX ) 40 MG tablet TAKE (1) TABLET BY MOUTH TWICE DAILY BEFORE MEALS. (Patient taking differently: Take 40 mg by mouth daily.) 60 tablet 0 12/03/2023 at  4:00 AM   pregabalin (LYRICA) 75 MG capsule Take 1 capsule by mouth 2 (two) times daily.   12/03/2023 at  4:00 AM   rosuvastatin  (CRESTOR ) 5 MG tablet TAKE ONE TABLET BY MOUTH ONCE DAILY. 30 tablet 11 12/03/2023 at  4:00 AM   gabapentin  (NEURONTIN ) 400 MG capsule  TAKE ONE CAPSULE BY MOUTH 3 TIMES A DAY (Patient not taking: Reported on 11/25/2023) 270 capsule 1 Not Taking   lidocaine  (LIDODERM ) 5 % Place 1 patch onto the skin daily. Remove & Discard patch within 12 hours or as directed by MD (Patient not taking: Reported on 11/25/2023) 14 patch 0 Not Taking   methocarbamol  (ROBAXIN ) 500 MG tablet Take 2 tablets (1,000 mg total) by mouth every 8 (eight) hours as needed. (Patient not taking: Reported on 11/25/2023) 24 tablet 0 Not Taking   oxyCODONE -acetaminophen  (PERCOCET) 7.5-325 MG tablet Take 1 tablet by mouth every 6 (six) hours as needed for severe pain (pain score 7-10). (Patient not taking: Reported on 11/25/2023) 20 tablet 0 Not Taking   predniSONE  (STERAPRED UNI-PAK 21 TAB) 10 MG (21) TBPK tablet Take by mouth daily. Take 6 tabs by mouth daily  for 2 days, then 5 tabs for 2 days, then 4 tabs for 2 days, then 3 tabs  for 2 days, 2 tabs for 2 days, then 1 tab by mouth daily for 2 days (Patient not taking: Reported on 11/25/2023) 42 tablet 0 Not Taking   Allergies  Allergen Reactions   Amoxicillin Hives   Aspirin Other (See Comments)    Cannot take due to ulcers   Nsaids Other (See Comments)    Cannot take due to ulcers     Social History   Tobacco Use   Smoking status: Every Day    Current packs/day: 0.50    Types: Cigarettes    Passive exposure: Current   Smokeless tobacco: Never   Tobacco comments:    15 cigarettes a day as of 11/06/23  Substance Use Topics   Alcohol use: No    Family History  Problem Relation Age of Onset   Heart disease Other    Arthritis Other    Cancer Other    Asthma Other    Diabetes Other    Heart disease Mother    Diabetes Mother    Cancer Mother    Non-Hodgkin's lymphoma Mother    Hypertension Mother    Arthritis Mother    Lung cancer Father    Neuropathy Father    Hypertension Father      Review of Systems Pertinent items are noted in HPI.  Objective:   Patient Vitals for the past 8 hrs:  BP Temp Temp  src Pulse Resp SpO2 Height Weight  12/03/23 0605 (!) 152/99 97.9 F (36.6 C) Oral 100 18 96 % 5\' 7"  (1.702 m) 124.3 kg   No intake/output data recorded. No intake/output data recorded.      General : Alert, cooperative, no distress, appears stated age.  BMI 43.   Head:  Normocephalic/atraumatic    Eyes: PERRL, conjunctiva/corneas clear, EOM's intact. Fundi could not be visualized Neck: Supple Chest:  Respirations unlabored Chest wall: no tenderness or deformity Heart: Regular rate and rhythm Abdomen: Soft, nontender and nondistended Extremities: warm and well-perfused Skin: normal turgor, color and texture Neurologic:  Alert, oriented x 3.  Eyes open spontaneously. PERRL, EOMI, VFC, no facial droop. V1-3 intact.  No dysarthria, tongue protrusion symmetric.  CNII-XII intact. + Hoffman's bilaterally, 4/5 hand grip strength, 4+ biceps reflexes       Data ReviewCBC:  Lab Results  Component Value Date   WBC 14.0 (H) 11/06/2023   WBC 17.3 (H) 01/31/2023   RBC 4.33 11/06/2023   RBC 4.21 01/31/2023   BMP:  Lab Results  Component Value Date   GLUCOSE 98 11/06/2023   GLUCOSE 146 (H) 01/31/2023   CO2 23 11/06/2023   BUN 22 11/06/2023   CREATININE 0.93 11/06/2023   CREATININE 0.55 04/24/2019   CALCIUM  9.5 11/06/2023   Radiology review:  See clinic note for details  Assessment:   Active Problems:   * No active hospital problems. 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

## 2023-12-03 NOTE — Progress Notes (Signed)
 Orthopedic Tech Progress Note Patient Details:  Brenda Taylor [birth year] [id number] Aspen collar delivered to PACU Patient ID: [name], female   DOB: 04/27/1971, 53 y.o.   MRN: [id number]  [name] 12/03/2023, 4:10 PM

## 2023-12-03 NOTE — Anesthesia Postprocedure Evaluation (Signed)
 Anesthesia Post Note  Patient: Brenda Taylor  Procedure(s) Performed: ANTERIOR CERVICAL DECOMPRESSION/DISCECTOMY FUSION CERVICAL THREE-FOUR CERVICAL FOUR-FIVE CERVICAL FIVE-SIX EXPLORE FUSION SIX-SEVEN     Patient location during evaluation: PACU Anesthesia Type: General Level of consciousness: awake and alert, patient cooperative and oriented Pain management: pain level controlled Vital Signs Assessment: post-procedure vital signs reviewed and stable Respiratory status: spontaneous breathing, nonlabored ventilation, respiratory function stable and patient connected to nasal cannula oxygen Cardiovascular status: blood pressure returned to baseline and stable Postop Assessment: no apparent nausea or vomiting Anesthetic complications: no   No notable events documented.  Last Vitals:  Vitals:   12/03/23 1600 12/03/23 1615  BP: 104/74 102/66  Pulse: (!) 115 (!) 119  Resp: 13 13  Temp:    SpO2: 96% 95%    Last Pain:  Vitals:   12/03/23 1545  TempSrc:   PainSc: Asleep    LLE Motor Response: Purposeful movement;Responds to commands (12/03/23 1615) LLE Sensation: Tingling;Numbness;Other (Comment) (had before surgery) (12/03/23 1615) RLE Motor Response: Purposeful movement;Responds to commands (12/03/23 1615) RLE Sensation: Full sensation (12/03/23 1615)      Dat Derksen,E. Nathalee Smarr

## 2023-12-04 ENCOUNTER — Encounter (HOSPITAL_COMMUNITY): Payer: Self-pay | Admitting: Neurosurgery

## 2023-12-04 ENCOUNTER — Other Ambulatory Visit: Payer: Self-pay | Admitting: Internal Medicine

## 2023-12-04 DIAGNOSIS — I10 Essential (primary) hypertension: Secondary | ICD-10-CM

## 2023-12-04 LAB — BASIC METABOLIC PANEL WITH GFR
Anion gap: 12 (ref 5–15)
BUN: 13 mg/dL (ref 6–20)
CO2: 25 mmol/L (ref 22–32)
Calcium: 9.2 mg/dL (ref 8.9–10.3)
Chloride: 99 mmol/L (ref 98–111)
Creatinine, Ser: 0.66 mg/dL (ref 0.44–1.00)
GFR, Estimated: >60 mL/min (ref >60)
Glucose, Bld: 178 mg/dL — ABNORMAL HIGH (ref 70–99)
Potassium: 4.2 mmol/L (ref 3.5–5.1)
Sodium: 136 mmol/L (ref 135–145)

## 2023-12-04 LAB — GLUCOSE, CAPILLARY: Glucose-Capillary: 167 mg/dL — ABNORMAL HIGH (ref 70–99)

## 2023-12-04 MED ORDER — METHOCARBAMOL 500 MG PO TABS: 500.0000 mg | ORAL_TABLET | Freq: Four times a day (QID) | ORAL | 1 refills | Status: DC | PRN

## 2023-12-04 MED ORDER — OXYCODONE HCL 10 MG PO TABS: 10.0000 mg | ORAL_TABLET | ORAL | 0 refills | Status: DC | PRN

## 2023-12-04 NOTE — Evaluation (Signed)
 Physical Therapy Evaluation  Patient Details Name: Brenda Taylor MRN: [id number] DOB: 02-May-1971 Today's Date: 12/04/2023  History of Present Illness  Pt is a 53 y.o. female s/p ACDF C3-7 12/03/2023. PMH significant for DDD, central cord syndrome, DMII, osteoarthritis, neuropathy, HTN, back surgery x5.  Clinical Impression  Pt admitted with above diagnosis. At the time of PT eval, pt was able to demonstrate transfers and ambulation with gross CGA with RW for support. Pt was educated on precautions, brace application/wearing schedule, appropriate activity progression, and car transfer. Pt currently with functional limitations due to the deficits listed below (see PT Problem List). Pt will benefit from skilled PT to increase their independence and safety with mobility to allow discharge to the venue listed below.          If plan is discharge home, recommend the following: A lot of help with walking and/or transfers;A lot of help with bathing/dressing/bathroom;Assistance with cooking/housework;Help with stairs or ramp for entrance   Can travel by private vehicle        Equipment Recommendations BSC/3in1  Recommendations for Other Services       Functional Status Assessment Patient has had a recent decline in their functional status and demonstrates the ability to make significant improvements in function in a reasonable and predictable amount of time.     Precautions / Restrictions Precautions Precautions: Fall;Cervical Precaution Booklet Issued: Yes (comment) Recall of Precautions/Restrictions: Intact Precaution/Restrictions Comments: Reviewed handout and pt was cued for precautions during functional mobility. Required Braces or Orthoses: Cervical Brace Cervical Brace: Hard collar;Other (comment) (routine orders, may remove to shower, may sleep without brace, may walk to restroom without brace) Restrictions Weight Bearing Restrictions Per Provider Order: No      Mobility  Bed  Mobility               General bed mobility comments: Pt was received sitting up EOB    Transfers Overall transfer level: Needs assistance Equipment used: Rolling walker (2 wheels) Transfers: Sit to/from Stand Sit to Stand: Contact guard assist           General transfer comment: VC's for hand placement on seated surface for safety. No assist required.    Ambulation/Gait Ambulation/Gait assistance: Contact guard assist   Assistive device: Rolling walker (2 wheels) Gait Pattern/deviations: Decreased step length - right, Decreased step length - left, Decreased stride length, Antalgic Gait velocity: slow Gait velocity interpretation: 1.31 - 2.62 ft/sec, indicative of limited community ambulator   General Gait Details: VC's for improved posture and closer walker proximity. Slow but generally steady without gross unsteadiness or LOB.  Stairs Stairs: Yes Stairs assistance: Contact guard assist Stair Management: One rail Left, Step to pattern, Forwards Number of Stairs: 5 General stair comments: VC's for sequencing and general safety.  Wheelchair Mobility     Tilt Bed    Modified Rankin (Stroke Patients Only)       Balance Overall balance assessment: Needs assistance Sitting-balance support: Feet supported, Bilateral upper extremity supported Sitting balance-Leahy Scale: Good Sitting balance - Comments: statically   Standing balance support: Reliant on assistive device for balance, During functional activity, Bilateral upper extremity supported Standing balance-Leahy Scale: Fair Standing balance comment: using RW                             Pertinent Vitals/Pain Pain Assessment Pain Assessment: Faces Faces Pain Scale: Hurts little more Pain Location: low back with radiation down LLE Pain  Descriptors / Indicators: Grimacing, Guarding Pain Intervention(s): Limited activity within patient's tolerance, Monitored during session, Repositioned     Home Living Family/patient expects to be discharged to:: Private residence Living Arrangements: Spouse/significant other;Other relatives (fiance, 3 grandchildren 11 mos, 6y.o, 9y.o.) Available Help at Discharge: Family;Available 24 hours/day Type of Home: House Home Access: Stairs to enter Entrance Stairs-Rails: Left Entrance Stairs-Number of Steps: 5   Home Layout: One level Home Equipment: Rollator (4 wheels);Cane - single point;Shower seat;BSC/3in1 Additional Comments: pt has custody of 3 grandchildren but has other family support    Prior Function Prior Level of Function : Independent/Modified Independent;Working/employed;Driving             Mobility Comments: Community ambulation without AD, drives, works ADLs Comments: Independent. Recently has been out of work since April 19 and using a rollator; prior to that was mod I. Recently started HHOT/PT services     Extremity/Trunk Assessment   Upper Extremity Assessment Upper Extremity Assessment: Defer to OT evaluation RUE Deficits / Details: generalized weakness, decr shoulder ROM, slowed pace of coordination RUE Sensation: decreased light touch RUE Coordination: decreased fine motor;decreased gross motor LUE Deficits / Details: generally weak, reports dropping things recently, shoulder ROM slightly greater than R; using BUE functionally todayb LUE Sensation: decreased light touch LUE Coordination: decreased fine motor;decreased gross motor    Lower Extremity Assessment Lower Extremity Assessment: Generalized weakness LLE Deficits / Details: Mild; consistent with pre-op diagnosis.    Cervical / Trunk Assessment Cervical / Trunk Assessment: Kyphotic  Communication   Communication Communication: No apparent difficulties    Cognition Arousal: Alert Behavior During Therapy: WFL for tasks assessed/performed   PT - Cognitive impairments: No apparent impairments                         Following  commands: Intact       Cueing Cueing Techniques: Verbal cues     General Comments      Exercises     Assessment/Plan    PT Assessment Patient needs continued PT services  PT Problem List Decreased strength;Decreased activity tolerance;Decreased balance;Decreased mobility       PT Treatment Interventions DME instruction;Gait training;Stair training;Functional mobility training;Therapeutic activities;Therapeutic exercise;Balance training;Patient/family education    PT Goals (Current goals can be found in the Care Plan section)  Acute Rehab PT Goals Patient Stated Goal: return home with family to assist PT Goal Formulation: With patient Time For Goal Achievement: 12/11/23 Potential to Achieve Goals: Good    Frequency Min 2X/week     Co-evaluation               AM-PAC PT "6 Clicks" Mobility  Outcome Measure Help needed turning from your back to your side while in a flat bed without using bedrails?: A Little Help needed moving from lying on your back to sitting on the side of a flat bed without using bedrails?: A Little Help needed moving to and from a bed to a chair (including a wheelchair)?: A Little Help needed standing up from a chair using your arms (e.g., wheelchair or bedside chair)?: A Little Help needed to walk in hospital room?: A Little Help needed climbing 3-5 steps with a railing? : A Little 6 Click Score: 18    End of Session Equipment Utilized During Treatment: Gait belt Activity Tolerance: Patient tolerated treatment well;Patient limited by fatigue;Patient limited by pain Patient left: in bed;with call bell/phone within reach Nurse Communication: Mobility status PT Visit Diagnosis: Unsteadiness  on feet (R26.81);Other abnormalities of gait and mobility (R26.89);Muscle weakness (generalized) (M62.81)    Time: 1610-9604 PT Time Calculation (min) (ACUTE ONLY): 23 min   Charges:   PT Evaluation $PT Eval Low Complexity: 1 Low PT Treatments $Gait  Training: 8-22 mins PT General Charges $$ ACUTE PT VISIT: 1 Visit         Simone Dubois, PT, DPT Acute Rehabilitation Services Secure Chat Preferred Office: [phone]   Venus Ginsberg 12/04/2023, 12:37 PM

## 2023-12-04 NOTE — Discharge Summary (Signed)
 Patient ID: Brenda Taylor MRN: [id number] DOB/AGE: August 22, 1970 53 y.o.  Admit date: 12/03/2023 Discharge date: 12/04/2023  Admission Diagnoses: Cervical myelopathy [facility]) [G95.9] Cervical spinal stenosis [M48.02]   Discharge Diagnoses: Same   Discharged Condition: Stable  Hospital Course:  Brenda Taylor is a 53 y.o. female who was admitted following an uncomplicated ACDF C3-6. They were recovered in PACU and transferred to the floor. Hospital course was uncomplicated. Pt stable for discharge today. Pt to f/u in office for routine post op visit. Pt is in agreement w/ plan. I have arranged ESI for patient's LLE pain in relation to L4-5 disc herniation.    Discharge Exam: Blood pressure (!) 148/89, pulse 93, temperature 98 F (36.7 C), temperature source Oral, resp. rate 18, height 5\' 7"  (1.702 m), weight 124.3 kg, SpO2 98%. A&O Speech fluent, appropriate Strength/sensation grossly intact BUE/BLE 4/5.  Collar in place Dressing c/d/I.  Ambulated w/ therapy w/o significant difficulty per patient.   Disposition: Discharge disposition: 01-Home or Self Care       Discharge Instructions     Incentive spirometry RT   Complete by: As directed       Allergies as of 12/04/2023       Reactions   Amoxicillin Hives   Aspirin Other (See Comments)   Cannot take due to ulcers   Nsaids Other (See Comments)   Cannot take due to ulcers         Medication List     STOP taking these medications    celecoxib  200 MG capsule Commonly known as: CELEBREX    gabapentin  400 MG capsule Commonly known as: NEURONTIN    oxyCODONE -acetaminophen  7.5-325 MG tablet Commonly known as: Percocet       TAKE these medications    ALPRAZolam  0.5 MG tablet Commonly known as: XANAX  TAKE ONE TABLET BY MOUTH AT BEDTIME AS NEEDED FOR ANXIETY. What changed: See the new instructions.   benzonatate  200 MG capsule Commonly known as: TESSALON  Take 1 capsule (200 mg total) by mouth 2 (two) times  daily as needed for cough. What changed: when to take this   busPIRone  15 MG tablet Commonly known as: BUSPAR  TAKE 1 TABLET BY MOUTH ONCE DAILY.   hydrochlorothiazide  25 MG tablet Commonly known as: HYDRODIURIL  TAKE 1 TABLET BY MOUTH ONCE A DAY.   lidocaine  5 % Commonly known as: Lidoderm  Place 1 patch onto the skin daily. Remove & Discard patch within 12 hours or as directed by MD   metFORMIN  500 MG 24 hr tablet Commonly known as: GLUCOPHAGE -XR TAKE ONE TABLET (=500MG  TOTAL) EVERY DAY WITH BREAKFAST   methocarbamol  500 MG tablet Commonly known as: ROBAXIN  Take 1 tablet (500 mg total) by mouth every 6 (six) hours as needed for muscle spasms. What changed:  how much to take when to take this reasons to take this   olopatadine  0.1 % ophthalmic solution Commonly known as: Pataday  Place 1 drop into both eyes 2 (two) times daily. What changed:  how much to take when to take this reasons to take this   Oxycodone  HCl 10 MG Tabs Take 1 tablet (10 mg total) by mouth every 4 (four) hours as needed for severe pain (pain score 7-10). What changed:  medication strength how much to take when to take this reasons to take this   pantoprazole  40 MG tablet Commonly known as: PROTONIX  TAKE (1) TABLET BY MOUTH TWICE DAILY BEFORE MEALS. What changed: See the new instructions.   predniSONE  10 MG (21) Tbpk tablet Commonly  known as: STERAPRED UNI-PAK 21 TAB Take by mouth daily. Take 6 tabs by mouth daily  for 2 days, then 5 tabs for 2 days, then 4 tabs for 2 days, then 3 tabs for 2 days, 2 tabs for 2 days, then 1 tab by mouth daily for 2 days   pregabalin 75 MG capsule Commonly known as: LYRICA Take 1 capsule by mouth 2 (two) times daily.   rosuvastatin  5 MG tablet Commonly known as: CRESTOR  TAKE ONE TABLET BY MOUTH ONCE DAILY.        Follow-up Information     [facility] Follow up.   Specialty: [facility] Contact information: [address] [place]  [address] [place] [address] [phone]                 Signed: Lajean Taylor Brenda Taylor Brenda Taylor 12/04/2023, 10:15 AM

## 2023-12-04 NOTE — Telephone Encounter (Unsigned)
 Copied from CRM [id number]. Topic: Clinical - Order For Equipment >> Dec 04, 2023 12:03 PM Baldomero Bone wrote: Reason for CRM: Brenda Taylor, daughter, states patient had neck surgery yesterday, and patient's surgeon advised patient to call PCP to request glucose monitor so that she can check sugar 3 to 4 times a days. Callback number is [phone]

## 2023-12-04 NOTE — Plan of Care (Signed)
 Pt doing well. Pt and family given D/C instructions with verbal understanding. Rx's were sent to the pharmacy by MD. Pt's incision is clean and dry with no sign of infection. Pt's IV and Hemovac were removed prior to D/C. Pt received RW from [employer] per MD order. Pt D/C'd home via wheelchair per MD order. Pt is stable @ D/C and has no other needs at this time. Barron Lien, RN

## 2023-12-04 NOTE — Evaluation (Signed)
 Occupational Therapy Evaluation Patient Details Name: Brenda Taylor MRN: [id number] DOB: 1970/12/01 Today's Date: 12/04/2023   History of Present Illness   Pt is a 53 y.o. female s/p ACDF C3-7 12/03/2023. PMH significant for DDD, central cord syndrome, DMII, osteoarthritis, HLD, neuropathy, HTN, back surgery x5.     Clinical Impressions PTA, pt lived with fiance and was mod I within the home, using rollator, but has not worked since April 19, and reports that she has had to have more assistance than usual taking care of her grandchildren. Upon eval, pt requiring set-up for UB ADL, min A for brace application, and CGA for LB ADL. Pt educated and demonstrating compensatory techniques for bed mobility, UB ADL, LB ADL, brace application, toileting, shower transfers, and grooming within precautions. All education provided and questions answered. Recommending discharge home with HHOT resumed.       If plan is discharge home, recommend the following:   A little help with walking and/or transfers;A little help with bathing/dressing/bathroom;Two people to help with bathing/dressing/bathroom;Assist for transportation;Help with stairs or ramp for entrance     Functional Status Assessment   Patient has had a recent decline in their functional status and demonstrates the ability to make significant improvements in function in a reasonable and predictable amount of time.     Equipment Recommendations   Other (comment) (RW)     Recommendations for Other Services   PT consult     Precautions/Restrictions   Precautions Precautions: Fall;Cervical Precaution Booklet Issued: Yes (comment) Recall of Precautions/Restrictions: Intact Precaution/Restrictions Comments: all precautions reviewed within the context of ADL Required Braces or Orthoses: Cervical Brace Cervical Brace: Hard collar;Other (comment) (routine orders, may remove to shower, may sleep without brace, may walk to restroom  without brace) Restrictions Weight Bearing Restrictions Per Provider Order: No     Mobility Bed Mobility               General bed mobility comments: discusssed compensatory techniques but pt OOB on arrival and EOB on departure ahs been sleeping on couch    Transfers Overall transfer level: Needs assistance Equipment used: Rolling walker (2 wheels) Transfers: Sit to/from Stand Sit to Stand: Contact guard assist           General transfer comment: for safety, cues for hand placement, predominantly during descent      Balance Overall balance assessment: Needs assistance Sitting-balance support: Feet supported, Bilateral upper extremity supported Sitting balance-Leahy Scale: Good Sitting balance - Comments: statically   Standing balance support: Reliant on assistive device for balance, During functional activity, Bilateral upper extremity supported Standing balance-Leahy Scale: Fair                             ADL either performed or assessed with clinical judgement   ADL Overall ADL's : Needs assistance/impaired Eating/Feeding: Modified independent;Sitting   Grooming: Contact guard assist;Standing   Upper Body Bathing: Set up;Sitting   Lower Body Bathing: Contact guard assist;Sit to/from stand   Upper Body Dressing : Set up;Sitting   Lower Body Dressing: Contact guard assist;Sitting/lateral leans;Sit to/from stand;Adhering to back precautions   Toilet Transfer: Contact guard assist;Ambulation;BSC/3in1;Rolling walker (2 wheels)     Toileting - Clothing Manipulation Details (indicate cue type and reason): reviewed compensatory techniques     Functional mobility during ADLs: Contact guard assist;Rolling walker (2 wheels)       Vision Baseline Vision/History: 1 Wears glasses Ability to See in Adequate Light:  0 Adequate Patient Visual Report: No change from baseline Vision Assessment?: No apparent visual deficits     Perception          Praxis         Pertinent Vitals/Pain Pain Assessment Pain Assessment: Faces Faces Pain Scale: Hurts little more Pain Location: low back with radiation down LLE Pain Descriptors / Indicators: Grimacing, Guarding Pain Intervention(s): Limited activity within patient's tolerance, Monitored during session     Extremity/Trunk Assessment Upper Extremity Assessment Upper Extremity Assessment: Generalized weakness;Right hand dominant;RUE deficits/detail;LUE deficits/detail RUE Deficits / Details: generalized weakness, decr shoulder ROM, slowed pace of coordination RUE Sensation: decreased light touch RUE Coordination: decreased fine motor;decreased gross motor LUE Deficits / Details: generally weak, reports dropping things recently, shoulder ROM slightly greater than R; using BUE functionally todayb LUE Sensation: decreased light touch LUE Coordination: decreased fine motor;decreased gross motor   Lower Extremity Assessment Lower Extremity Assessment: Defer to PT evaluation   Cervical / Trunk Assessment Cervical / Trunk Assessment: Kyphotic   Communication Communication Communication: No apparent difficulties   Cognition Arousal: Alert Behavior During Therapy: WFL for tasks assessed/performed Cognition: No apparent impairments             OT - Cognition Comments: able to recall all precautions and implement functionally with min cues during new education                 Following commands: Intact       Cueing  General Comments   Cueing Techniques: Verbal cues      Exercises     Shoulder Instructions      Home Living Family/patient expects to be discharged to:: Private residence Living Arrangements: Spouse/significant other;Other relatives (fiance, 3 grandchildren 11 mos, 6y.o, 9y.o.) Available Help at Discharge: Family;Available 24 hours/day Type of Home: House Home Access: Stairs to enter [employer] of Steps: 5 Entrance Stairs-Rails:  Left Home Layout: One level     Bathroom Shower/Tub: Chief Strategy Officer: Standard Bathroom Accessibility: Yes How Accessible: Accessible via walker Home Equipment: Rollator (4 wheels);Cane - single point;Shower seat;BSC/3in1   Additional Comments: pt has custody of 3 grandchildren but has other family support      Prior Functioning/Environment Prior Level of Function : Independent/Modified Independent;Working/employed;Driving             Mobility Comments: Community ambulation without AD, drives, works ADLs Comments: Independent. Recently has been out of work since April 19 and using a rollator; prior to that was mod I. Recently started HHOT/PT services    OT Problem List: Decreased strength;Decreased activity tolerance;Impaired balance (sitting and/or standing);Decreased coordination;Decreased knowledge of use of DME or AE;Decreased knowledge of precautions;Impaired UE functional use   OT Treatment/Interventions: Therapeutic exercise;Self-care/ADL training;DME and/or AE instruction;Therapeutic activities;Patient/family education;Balance training      OT Goals(Current goals can be found in the care plan section)   Acute Rehab OT Goals Patient Stated Goal: get better OT Goal Formulation: With patient Time For Goal Achievement: 12/18/23 Potential to Achieve Goals: Good   OT Frequency:  Min 1X/week    Co-evaluation              AM-PAC OT "6 Clicks" Daily Activity     Outcome Measure Help from another person eating meals?: None Help from another person taking care of personal grooming?: A Little Help from another person toileting, which includes using toliet, bedpan, or urinal?: A Little Help from another person bathing (including washing, rinsing, drying)?: A Little Help from another  person to put on and taking off regular upper body clothing?: A Little Help from another person to put on and taking off regular lower body clothing?: A Little 6  Click Score: 19   End of Session Equipment Utilized During Treatment: Gait belt;Rolling walker (2 wheels);Cervical collar Nurse Communication: Mobility status  Activity Tolerance: Patient tolerated treatment well Patient left: in bed;with call bell/phone within reach  OT Visit Diagnosis: Unsteadiness on feet (R26.81);Muscle weakness (generalized) (M62.81);Pain Pain - part of body:  (LLE)                Time: 1610-9604 OT Time Calculation (min): 34 min Charges:  OT General Charges $OT Visit: 1 Visit OT Evaluation $OT Eval Low Complexity: 1 Low OT Treatments $Self Care/Home Management : 8-22 mins  Karilyn Ouch, OTR/L [facility] Acute Rehabilitation Office: [phone]   Brenda Taylor 12/04/2023, 9:14 AM

## 2023-12-04 NOTE — Care Management (Signed)
 Patient with order to DC to home today. Unit staff to provide DME needed for home.   Patient active w [facility] prior to admission. Will need HH resumption orders if needed for DC   Liaison for agency has been notified of admission. Information added to AVS  Patient will have family/ friends provide transportation home.

## 2023-12-05 ENCOUNTER — Telehealth: Payer: Self-pay

## 2023-12-05 NOTE — Transitions of Care (Post Inpatient/ED Visit) (Signed)
   12/05/2023  Name: Brenda Taylor MRN: [id number] DOB: 15-Apr-1971  Today's TOC FU Call Status: Today's TOC FU Call Status:: Unsuccessful Call (1st Attempt) Unsuccessful Call (1st Attempt) Date: 12/05/23  Attempted to reach the patient regarding the most recent Inpatient/ED visit.  Follow Up Plan: Additional outreach attempts will be made to reach the patient to complete the Transitions of Care (Post Inpatient/ED visit) call.   Tonia Frankel RN, CCM [place]  VBCI-Population Health RN Care Manager [phone]

## 2023-12-06 ENCOUNTER — Telehealth: Payer: Self-pay

## 2023-12-06 NOTE — Transitions of Care (Post Inpatient/ED Visit) (Signed)
   12/06/2023  Name: Brenda Taylor MRN: [id number] DOB: 09/30/1970  Today's TOC FU Call Status: Today's TOC FU Call Status:: Unsuccessful Call (2nd Attempt) Unsuccessful Call (2nd Attempt) Date: 12/06/23  Attempted to reach the patient regarding the most recent Inpatient/ED visit.Patient answered but said she was already on phone with case worker and agreed to call back next week  Follow Up Plan: Additional outreach attempts will be made to reach the patient to complete the Transitions of Care (Post Inpatient/ED visit) call.   Tonia Frankel RN, CCM [place]  VBCI-Population Health RN Care Manager [phone]

## 2023-12-09 ENCOUNTER — Other Ambulatory Visit: Payer: Self-pay

## 2023-12-09 ENCOUNTER — Telehealth: Payer: Self-pay

## 2023-12-09 DIAGNOSIS — E1169 Type 2 diabetes mellitus with other specified complication: Secondary | ICD-10-CM

## 2023-12-09 MED ORDER — BLOOD GLUCOSE TEST VI STRP: 1.0000 | ORAL_STRIP | Freq: Three times a day (TID) | 5 refills | Status: AC

## 2023-12-09 MED ORDER — LANCETS MISC. MISC: 1.0000 | Freq: Three times a day (TID) | 0 refills | Status: DC

## 2023-12-09 MED ORDER — LANCET DEVICE MISC: 1.0000 | Freq: Three times a day (TID) | 0 refills | Status: DC

## 2023-12-09 MED ORDER — BLOOD GLUCOSE MONITORING SUPPL DEVI: 1.0000 | Freq: Three times a day (TID) | 0 refills | Status: DC

## 2023-12-09 NOTE — Telephone Encounter (Signed)
 Copied from CRM [id number]. Topic: Clinical - Home Health Verbal Orders >> Dec 09, 2023 11:08 AM Essie A wrote: Caller/Agency: [facility] Callback Number: [phone] Service Requested: Occupational Therapy Frequency: once a week for 5 weeks Any new concerns about the patient? Yes Resting heart rate is 101, BP 144/97.  Patient wants to know if her glucometer has been sent to [facility]

## 2023-12-09 NOTE — Transitions of Care (Post Inpatient/ED Visit) (Signed)
 12/09/2023  Name: Brenda Taylor MRN: [id number] DOB: 02-Nov-1970  Today's TOC FU Call Status: Today's TOC FU Call Status:: Successful TOC FU Call Completed TOC FU Call Complete Date: 12/09/23 Patient's Name and Date of Birth confirmed.  Transition Care Management Follow-up Telephone Call How have you been since you were released from the hospital?: Same (Patient states her pre-op pain is no better but now she has surgical pain of neck in addition to left leg pain that she had prior to this surgery) Any questions or concerns?: No  Items Reviewed:    Medications Reviewed Today: Medications Reviewed Today     Reviewed by Sharmaine Dearth, RN (Registered Nurse) on 12/09/23 at 1401  Med List Status: <None>   Medication Order Taking? Sig Documenting Provider Last Dose Status Informant  ALPRAZolam  (XANAX ) 0.5 MG tablet [id number] Yes TAKE ONE TABLET BY MOUTH AT BEDTIME AS NEEDED FOR ANXIETY.  Patient taking differently: Take 0.5 mg by mouth at bedtime.   Meldon Sport, MD Taking Active Self, Pharmacy Records  benzonatate  (TESSALON ) 200 MG capsule [id number] Yes Take 1 capsule (200 mg total) by mouth 2 (two) times daily as needed for cough.  Patient taking differently: Take 200 mg by mouth in the morning and at bedtime.   Meldon Sport, MD Taking Active Self, Pharmacy Records  busPIRone  (BUSPAR ) 15 MG tablet [id number] No TAKE 1 TABLET BY MOUTH ONCE DAILY.  Patient not taking: Reported on 12/09/2023   Meldon Sport, MD Not Taking Active Self, Pharmacy Records           Med Note [facility], [place] A   Mon Dec 09, 2023  2:00 PM) Per 12/09/23 review-patient states she is NOT taking this medication by her choice  hydrochlorothiazide  (HYDRODIURIL ) 25 MG tablet [id number] Yes TAKE 1 TABLET BY MOUTH ONCE A DAY. Meldon Sport, MD Taking Active   lidocaine  (LIDODERM ) 5 % [id number] No Place 1 patch onto the skin daily. Remove & Discard patch within 12 hours or as directed by MD  Patient not taking:  Reported on 11/25/2023   Iva Mariner, MD Not Taking Active Self, Pharmacy Records  metFORMIN  (GLUCOPHAGE -XR) 500 MG 24 hr tablet [id number] Yes TAKE ONE TABLET (=500MG  TOTAL) EVERY DAY WITH BREAKFAST Meldon Sport, MD Taking Active Self, Pharmacy Records  methocarbamol  (ROBAXIN ) 500 MG tablet [id number] Yes Take 1 tablet (500 mg total) by mouth every 6 (six) hours as needed for muscle spasms. Easter Golden Caylin, PA-C Taking Active   olopatadine  (PATADAY ) 0.1 % ophthalmic solution [id number] No Place 1 drop into both eyes 2 (two) times daily.  Patient not taking: Reported on 12/09/2023   Meldon Sport, MD Not Taking Active Self, Pharmacy Records  oxyCODONE  10 MG TABS [id number] No Take 1 tablet (10 mg total) by mouth every 4 (four) hours as needed for severe pain (pain score 7-10).  Patient not taking: Reported on 12/09/2023   Tomlinson, Sara Caylin, PA-C Not Taking Active            Med Note [facility]adene Brewer, [place] A   Mon Dec 09, 2023  2:01 PM) Patient states she completed this but has called surgeon's office to see if MD will prescribe something for pain   pantoprazole  (PROTONIX ) 40 MG tablet [id number] Yes TAKE (1) TABLET BY MOUTH TWICE DAILY BEFORE MEALS.  Patient taking differently: Take 40 mg by mouth daily.   Vinetta Greening, DO Taking Active Self, Pharmacy Records  Med Note (CRUTHIS, CHLOE C   Mon Nov 25, 2023 10:42 AM) Pt stated she is only taking 40 mg once daily.   predniSONE  (STERAPRED UNI-PAK 21 TAB) 10 MG (21) TBPK tablet [id number] No Take by mouth daily. Take 6 tabs by mouth daily  for 2 days, then 5 tabs for 2 days, then 4 tabs for 2 days, then 3 tabs for 2 days, 2 tabs for 2 days, then 1 tab by mouth daily for 2 days  Patient not taking: Reported on 11/25/2023   Iva Mariner, MD Not Taking Consider Medication Status and Discontinue (Completed Course) Self, Pharmacy Records  pregabalin  (LYRICA ) 75 MG capsule [id number] Yes Take 1 capsule by mouth 2 (two) times daily. [provider] Taking Active Self, Pharmacy Records  rosuvastatin  (CRESTOR ) 5 MG tablet [id number] Yes TAKE ONE TABLET BY MOUTH ONCE DAILY. Meldon Sport, MD Taking Active Self, Pharmacy Records            Home Care and Equipment/Supplies: Were Home Health Services Ordered?: Yes Name of Home Health Agency:: [facility] Follow up PT/OT Has Agency set up a time to come to your home?: Yes First Home Health Visit Date: 12/09/23 Any new equipment or medical supplies ordered?: Yes Name of Medical supply agency?: patient has walker sent home from hospital Were you able to get the equipment/medical supplies?: Yes Do you have any questions related to the use of the equipment/supplies?: No  Functional Questionnaire: Do you need assistance with bathing/showering or dressing?: Yes (patient states her live in fiance and her daughter  helps as needed) Do you need assistance with meal preparation?: Yes Do you need assistance with eating?: No Do you have difficulty maintaining continence: No Do you need assistance with getting out of bed/getting out of a chair/moving?: Yes (Patient using walker to assist) Do you have difficulty managing or taking your medications?: No  Follow up appointments reviewed: PCP Follow-up appointment confirmed?: Yes Date of PCP follow-up appointment?: 12/17/23 Follow-up Provider: PCP office with FNP Specialist Hospital Follow-up appointment confirmed?: Yes Date of Specialist follow-up appointment?: 12/18/23 Follow-Up Specialty Provider:: Arvilla [place] surgeon Do you need transportation to your follow-up appointment?: No (Daughter will help patient get to appointment) Do you understand care options if your condition(s) worsen?: Yes-patient verbalized understanding  SDOH Interventions Today    Flowsheet Row Most Recent Value  SDOH Interventions   Food Insecurity Interventions Intervention Not Indicated  Housing Interventions Intervention Not Indicated   Utilities Interventions Intervention Not Indicated       Goals Addressed             This Visit's Progress    VBCI Transitions of Care (TOC) Care Plan       Problems:  Recent Hospitalization for treatment of Cervical myelopathy; Cervical spinal stenosis ANTERIOR CERVICAL DECOMPRESSION/DISCECTOMY FUSION CERVICAL THREE-FOUR CERVICAL FOUR-FIVE CERVICAL FIVE-SIX EXPLORE FUSION SIX-SEVEN - states this is her 2nd C spine surgery Pain - patient reached out to her surgeon's office re: need for additional pain medication  Goal:  Over the next 30 days, the patient will not experience hospital readmission  Interventions:  Transitions of Care: Doctor Visits  - discussed the importance of doctor visits Arranged PCP follow-up within 12-14 days (Care Guide Scheduled) Post-op wound/incision care reviewed with patient/caregiver  Diabetes Interventions: Assessed patient's understanding of A1c goal: <6.5% Reviewed medications with patient and discussed importance of medication adherence Reviewed scheduled/upcoming provider appointments including: see all appointments below Assessed social determinant of health barriers [facility] RN sent  inbasket message to PCP requesting glucometer.   Lab Results  Component Value Date   HGBA1C 6.4 (H) 11/06/2023    Surgery (Cervical myelopathy; Cervical spinal stenosis ANTERIOR CERVICAL DECOMPRESSION/DISCECTOMY FUSION CERVICAL THREE-FOUR CERVICAL FOUR-FIVE CERVICAL FIVE-SIX EXPLORE FUSION SIX-SEVEN ): Evaluation of current treatment plan related to Cervical myelopathy; Cervical spinal stenosis ANTERIOR CERVICAL DECOMPRESSION/DISCECTOMY FUSION CERVICAL THREE-FOUR CERVICAL FOUR-FIVE CERVICAL FIVE-SIX EXPLORE FUSION SIX-SEVEN  surgery assessed patient/caregiver understanding of surgical procedure   reviewed medications with patient and addressed questions  Patient Self Care Activities:  Attend all scheduled provider appointments Call pharmacy for medication refills  3-7 days in advance of running out of medications Call provider office for new concerns or questions  Notify RN Care Manager of TOC call rescheduling needs Participate in Transition of Care Program/Attend TOC scheduled calls Take medications as prescribed   drink 6 to 8 glasses of water  each day fill half of plate with vegetables  Plan:  Next PCP appointment scheduled for: 12/17/23 4pm Surgeon Appointment: 12/18/23 Telephone follow up appointment with care management team member scheduled for:  [facility] RN, Orpha Blade 12/19/23 10am The patient has been provided with contact information for the care management team and has been advised to call with any health related questions or concerns.         Tonia Frankel RN, CCM [place]  VBCI-Population Health RN Care Manager [phone]

## 2023-12-09 NOTE — Telephone Encounter (Signed)
 Orders confirmed with Mallory.

## 2023-12-09 NOTE — Telephone Encounter (Signed)
 Glucometer sent

## 2023-12-11 ENCOUNTER — Ambulatory Visit: Payer: Self-pay | Admitting: Internal Medicine

## 2023-12-17 ENCOUNTER — Inpatient Hospital Stay

## 2023-12-19 ENCOUNTER — Other Ambulatory Visit: Payer: Self-pay

## 2023-12-19 ENCOUNTER — Telehealth: Payer: Self-pay

## 2023-12-19 NOTE — Telephone Encounter (Signed)
 Copied from CRM [id number]. Topic: Clinical - Home Health Verbal Orders >> Dec 19, 2023  2:26 PM Stanly Early wrote: Caller/Agency: [facility] [place] [facility] Callback Number: [phone] okay to leave voicemail Service Requested: Physical Therapy Frequency: 1*6  Any new concerns about the patient? No

## 2023-12-19 NOTE — Telephone Encounter (Signed)
 Approved orders via vm

## 2023-12-19 NOTE — Telephone Encounter (Signed)
 Tried calling agency unable to get through

## 2023-12-19 NOTE — Transitions of Care (Post Inpatient/ED Visit) (Signed)
 Transition of Care week 2  Visit Note  12/19/2023  Name: Brenda Taylor MRN: [id number]          DOB: 03/22/1971  Situation: Patient enrolled in [facility] 30-day program. Visit completed with pt by telephone.   Background:   Initial Transition Care Management Follow-up Telephone Call    Past Medical History:  Diagnosis Date   Anxiety    Arthritis    Chronic pain    DDD (degenerative disc disease)    Depression    Diabetes mellitus without complication (HCC)    GERD (gastroesophageal reflux disease)    HBP (high blood pressure)    Hernia    History of hiatal hernia    Multiple gastric ulcers    Neuromuscular disorder (HCC)    Neuropathy bilateral feet and hands   Rotator cuff syndrome of left shoulder 10/09/2011   Ulcer disease     Assessment:Patient reports that her neck is better but her back is not .  Reports left leg pain, numbness, tingling, burning, aching and hurting.  Saw Neurosurgeon for follow up with neck.  June 10th is injection for back.  Recent surgery is neck only.  5 prior back surgery.   Pain level neck 7,  pain level for leg- 9.   Patient Reported Symptoms: Cognitive Cognitive Status: Able to follow simple commands, Alert and oriented to person, place, and time, Normal speech and language skills      Neurological Neurological Review of Symptoms: No symptoms reported    HEENT HEENT Symptoms Reported: Sore throat Other HEENT Symptoms/Conditions: continues to have a sore throat.      Cardiovascular Cardiovascular Symptoms Reported: No symptoms reported    Respiratory Respiratory Symptoms Reported: No symptoms reported    Endocrine Patient reports the following symptoms related to hypoglycemia or hyperglycemia : No symptoms reported Is patient diabetic?: Yes Is patient checking blood sugars at home?: Yes Endocrine Conditions: Diabetes Endocrine Management Strategies: Medication therapy Endocrine Self-Management Outcome: 4 (good) Endocrine Comment: reports  that she has a new glucometer.  Gastrointestinal Gastrointestinal Symptoms Reported: Constipation Additional Gastrointestinal Details: is taking a stool softner. Gastrointestinal Management Strategies: Medication therapy    Genitourinary Genitourinary Symptoms Reported: No symptoms reported    Integumentary Integumentary Symptoms Reported: Incision Additional Integumentary Details: front incision. - glued.  seen by surgeon yesterday and dressing removed. Skin Management Strategies: Medication therapy, Medical device Skin Self-Management Outcome: 4 (good) Skin Comment: Wearing neck brace, dressing removed by surgeon yesterday.   no current dressing.  Musculoskeletal Musculoskelatal Symptoms Reviewed: Difficulty walking, Unsteady gait Additional Musculoskeletal Details: leg pain, tingling and numbness using a walker. Musculoskeletal Conditions: Osteoarthritis Musculoskeletal Management Strategies: Medication therapy Musculoskeletal Self-Management Outcome: 4 (good) Musculoskeletal Comment: 5 back surgeries. pending back injection, reports neck pain is decreasing      Psychosocial Psychosocial Symptoms Reported: No symptoms reported         There were no vitals filed for this visit.  Medications Reviewed Today     Reviewed by Vanetta Generous, RN (Registered Nurse) on 12/19/23 at 1016  Med List Status: <None>   Medication Order Taking? Sig Documenting Provider Last Dose Status Informant  ALPRAZolam  (XANAX ) 0.5 MG tablet [id number] Yes TAKE ONE TABLET BY MOUTH AT BEDTIME AS NEEDED FOR ANXIETY.  Patient taking differently: Take 0.5 mg by mouth at bedtime.   Meldon Sport, MD Taking Active Self, Pharmacy Records  benzonatate  (TESSALON ) 200 MG capsule [id number] Yes Take 1 capsule (200 mg total) by mouth 2 (two) times  daily as needed for cough.  Patient taking differently: Take 200 mg by mouth in the morning and at bedtime.   Meldon Sport, MD Taking Active Self, Pharmacy Records   Blood Glucose Monitoring Suppl DEVI [id number] Yes 1 each by Does not apply route in the morning, at noon, and at bedtime. May substitute to any manufacturer covered by patient's insurance. Meldon Sport, MD Taking Active   busPIRone  (BUSPAR ) 15 MG tablet [id number] Yes TAKE 1 TABLET BY MOUTH ONCE DAILY. Meldon Sport, MD Taking Active Self, Pharmacy Records           Med Note [facility], [place] A   Mon Dec 09, 2023  2:00 PM) Per 12/09/23 review-patient states she is NOT taking this medication by her choice  Glucose Blood (BLOOD GLUCOSE TEST STRIPS) STRP [id number] Yes 1 each by In Vitro route in the morning, at noon, and at bedtime. May substitute to any manufacturer covered by patient's insurance. Meldon Sport, MD Taking Active   hydrochlorothiazide  (HYDRODIURIL ) 25 MG tablet [id number] Yes TAKE 1 TABLET BY MOUTH ONCE A DAY. Meldon Sport, MD Taking Active   Lancet Device MISC [id number] Yes 1 each by Does not apply route in the morning, at noon, and at bedtime. May substitute to any manufacturer covered by patient's insurance. Meldon Sport, MD Taking Active   Lancets Misc. MISC [id number] Yes 1 each by Does not apply route in the morning, at noon, and at bedtime. May substitute to any manufacturer covered by patient's insurance. Meldon Sport, MD Taking Active   lidocaine  (LIDODERM ) 5 % [id number] No Place 1 patch onto the skin daily. Remove & Discard patch within 12 hours or as directed by MD  Patient not taking: Reported on 11/25/2023   Iva Mariner, MD Not Taking Active Self, Pharmacy Records  metFORMIN  (GLUCOPHAGE -XR) 500 MG 24 hr tablet [id number] Yes TAKE ONE TABLET (=500MG  TOTAL) EVERY DAY WITH BREAKFAST Meldon Sport, MD Taking Active Self, Pharmacy Records  methocarbamol  (ROBAXIN ) 500 MG tablet [id number] Yes Take 1 tablet (500 mg total) by mouth every 6 (six) hours as needed for muscle spasms. Easter Golden Caylin, PA-C Taking Active   olopatadine  (PATADAY ) 0.1 % ophthalmic solution  [id number] No Place 1 drop into both eyes 2 (two) times daily.  Patient not taking: Reported on 12/09/2023   Meldon Sport, MD Not Taking Active Self, Pharmacy Records  oxycodone  (OXY-IR) 5 MG capsule [id number] Yes Take 5 mg by mouth every 4 (four) hours as needed for pain. Take 1-2 every 6 hours as needed [provider] Taking Active   oxyCODONE  10 MG TABS [id number] No Take 1 tablet (10 mg total) by mouth every 4 (four) hours as needed for severe pain (pain score 7-10).  Patient not taking: Reported on 12/19/2023   Tomlinson, Sara Caylin, PA-C Not Taking Active            Med Note Bernadene Brewer, [place] A   Mon Dec 09, 2023  2:01 PM) Patient states she completed this but has called surgeon's office to see if MD will prescribe something for pain   pantoprazole  (PROTONIX ) 40 MG tablet [id number] Yes TAKE (1) TABLET BY MOUTH TWICE DAILY BEFORE MEALS.  Patient taking differently: Take 40 mg by mouth daily.   Vinetta Greening, DO Taking Active Self, Pharmacy Records           Med Note (CRUTHIS, Lenor Raddle Nov 25, 2023 10:42 AM) Pt stated she is only  taking 40 mg once daily.   predniSONE  (STERAPRED UNI-PAK 21 TAB) 10 MG (21) TBPK tablet [id number]  Take by mouth daily. Take 6 tabs by mouth daily  for 2 days, then 5 tabs for 2 days, then 4 tabs for 2 days, then 3 tabs for 2 days, 2 tabs for 2 days, then 1 tab by mouth daily for 2 days  Patient not taking: Reported on 11/25/2023   Iva Mariner, MD  Active Self, Pharmacy Records  pregabalin  (LYRICA ) 75 MG capsule [id number] Yes Take 1 capsule by mouth 2 (two) times daily. [provider] Taking Active Self, Pharmacy Records  rosuvastatin  (CRESTOR ) 5 MG tablet [id number] Yes TAKE ONE TABLET BY MOUTH ONCE DAILY. Meldon Sport, MD Taking Active Self, Pharmacy Records            Recommendation:   PCP Follow-up as planned.    Follow Up Plan:   Telephone follow up appointment date/time:  12/26/2023  10 am  Orpha Blade, RN, BSN,  CEN Population Health- Transition of Care Team.  [facility] [phone]

## 2023-12-19 NOTE — Patient Instructions (Signed)
 Visit Information  Thank you for taking time to visit with me today. Please don't hesitate to contact me if I can be of assistance to you before our next scheduled telephone appointment.  Following are the goals we discussed today:   Goals      [facility] Transitions of Care (TOC) Care Plan     Problems:  Recent Hospitalization for treatment of Cervical myelopathy; Cervical spinal stenosis ANTERIOR CERVICAL DECOMPRESSION/DISCECTOMY FUSION CERVICAL THREE-FOUR CERVICAL FOUR-FIVE CERVICAL FIVE-SIX EXPLORE FUSION SIX-SEVEN - states this is her 2nd C spine surgery.   12/19/2023  Patient reports that her neck is getting better but complains of leg pain, numbness and tingling.  Has a scheduled injection for her back. Continues to wear her neck brace.  Pain - patient reached out to her surgeon's office re: need for additional pain medication5/29/2025   Reports her pain medications has been changed from 10 mg to 5 mg.  Reports that her pain is ok.  Reports worse pain in her leg which is not part of this recent surgery.   Goal:  Over the next 30 days, the patient will not experience hospital readmission  Interventions:  Transitions of Care: Doctor Visits  - discussed the importance of doctor visits Reviewed s/s of infection Reviewed when to call MD for changes in condition Diabetes Interventions: Assessed patient's understanding of A1c goal: <6.5% Reviewed medications with patient and discussed importance of medication adherence Reviewed scheduled/upcoming provider appointments including: see all appointments below  Lab Results  Component Value Date   HGBA1C 6.4 (H) 11/06/2023    Surgery (Cervical myelopathy; Cervical spinal stenosis ANTERIOR CERVICAL DECOMPRESSION/DISCECTOMY FUSION CERVICAL THREE-FOUR CERVICAL FOUR-FIVE CERVICAL FIVE-SIX EXPLORE FUSION SIX-SEVEN ):  Reviewed importance of wound assessment daily. Reviewed importance of continuing to wear her neck brace.   Patient Self Care  Activities:  Attend all scheduled provider appointments Call pharmacy for medication refills 3-7 days in advance of running out of medications Call provider office for new concerns or questions  Notify RN Care Manager of TOC call rescheduling needs Participate in Transition of Care Program/Attend TOC scheduled calls Take medications as prescribed   Take pain medications as prescribed Monitor CBG as directed.   Plan:  Telephone follow up appointment with care management team member scheduled for:  [facility] RN, Orpha Blade 12/26/2023 10am The patient has been provided with contact information for the care management team and has been advised to call with any health related questions or concerns.          Our next appointment is by telephone on 12/26/2023 at 1000  Please call the care guide team at [phone] if you need to cancel or reschedule your appointment.   If you are experiencing a Mental Health or Behavioral Health Crisis or need someone to talk to, please call the Suicide and Crisis Lifeline: 988 call the [place]  National Suicide Prevention Lifeline: 959-078-3790 or TTY: (267) 540-3608 TTY 256-323-7806) to talk to a trained counselor call 1-800-273-TALK (toll free, 24 hour hotline) call 911   Patient verbalizes understanding of instructions and care plan provided today and agrees to view in MyChart. Active MyChart status and patient understanding of how to access instructions and care plan via MyChart confirmed with patient.     Orpha Blade, RN, BSN, CEN [employer]- Transition of Care Team.  [facility] [phone]

## 2023-12-20 NOTE — Telephone Encounter (Signed)
 Brenda Taylor calling back to check on orders, advised of [name]'s message. Brenda Taylor verbalized understanding.

## 2023-12-23 ENCOUNTER — Other Ambulatory Visit: Payer: Self-pay | Admitting: Internal Medicine

## 2023-12-23 DIAGNOSIS — F419 Anxiety disorder, unspecified: Secondary | ICD-10-CM

## 2023-12-23 DIAGNOSIS — E1169 Type 2 diabetes mellitus with other specified complication: Secondary | ICD-10-CM

## 2023-12-24 ENCOUNTER — Encounter: Payer: Self-pay | Admitting: Internal Medicine

## 2023-12-26 ENCOUNTER — Telehealth: Payer: Self-pay

## 2023-12-30 ENCOUNTER — Inpatient Hospital Stay (HOSPITAL_COMMUNITY)
Admission: EM | Admit: 2023-12-30 | Discharge: 2024-01-02 | DRG: 872 | Disposition: A | Discharge status: Home Health | Attending: Internal Medicine | Admitting: Internal Medicine

## 2023-12-30 ENCOUNTER — Other Ambulatory Visit: Payer: Self-pay

## 2023-12-30 ENCOUNTER — Encounter (HOSPITAL_COMMUNITY): Payer: Self-pay

## 2023-12-30 ENCOUNTER — Emergency Department (HOSPITAL_COMMUNITY)

## 2023-12-30 DIAGNOSIS — Z743 Need for continuous supervision: Secondary | ICD-10-CM | POA: Diagnosis not present

## 2023-12-30 DIAGNOSIS — Z6841 Body Mass Index (BMI) 40.0 and over, adult: Secondary | ICD-10-CM | POA: Diagnosis not present

## 2023-12-30 DIAGNOSIS — E66813 Obesity, class 3: Secondary | ICD-10-CM | POA: Diagnosis present

## 2023-12-30 DIAGNOSIS — Z7984 Long term (current) use of oral hypoglycemic drugs: Secondary | ICD-10-CM

## 2023-12-30 DIAGNOSIS — Z981 Arthrodesis status: Secondary | ICD-10-CM | POA: Diagnosis not present

## 2023-12-30 DIAGNOSIS — A419 Sepsis, unspecified organism: Secondary | ICD-10-CM | POA: Diagnosis present

## 2023-12-30 DIAGNOSIS — F17210 Nicotine dependence, cigarettes, uncomplicated: Secondary | ICD-10-CM | POA: Diagnosis present

## 2023-12-30 DIAGNOSIS — A400 Sepsis due to streptococcus, group A: Secondary | ICD-10-CM | POA: Diagnosis not present

## 2023-12-30 DIAGNOSIS — Z1152 Encounter for screening for COVID-19: Secondary | ICD-10-CM | POA: Diagnosis not present

## 2023-12-30 DIAGNOSIS — J36 Peritonsillar abscess: Secondary | ICD-10-CM | POA: Diagnosis not present

## 2023-12-30 DIAGNOSIS — Z79899 Other long term (current) drug therapy: Secondary | ICD-10-CM | POA: Diagnosis not present

## 2023-12-30 DIAGNOSIS — I10 Essential (primary) hypertension: Secondary | ICD-10-CM | POA: Diagnosis present

## 2023-12-30 DIAGNOSIS — E1142 Type 2 diabetes mellitus with diabetic polyneuropathy: Secondary | ICD-10-CM

## 2023-12-30 DIAGNOSIS — M549 Dorsalgia, unspecified: Secondary | ICD-10-CM | POA: Diagnosis not present

## 2023-12-30 DIAGNOSIS — E1140 Type 2 diabetes mellitus with diabetic neuropathy, unspecified: Secondary | ICD-10-CM | POA: Diagnosis present

## 2023-12-30 DIAGNOSIS — Z880 Allergy status to penicillin: Secondary | ICD-10-CM | POA: Diagnosis not present

## 2023-12-30 DIAGNOSIS — R0902 Hypoxemia: Secondary | ICD-10-CM | POA: Diagnosis not present

## 2023-12-30 LAB — CBC WITH DIFFERENTIAL/PLATELET
Abs Immature Granulocytes: 0.24 10*3/uL — ABNORMAL HIGH (ref 0.00–0.07)
Basophils Absolute: 0.1 10*3/uL (ref 0.0–0.1)
Basophils Relative: 1 %
Eosinophils Absolute: 0.3 10*3/uL (ref 0.0–0.5)
Eosinophils Relative: 1 %
HCT: 40.8 % (ref 36.0–46.0)
Hemoglobin: 13.5 g/dL (ref 12.0–15.0)
Immature Granulocytes: 1 %
Lymphocytes Relative: 17 %
Lymphs Abs: 3.5 10*3/uL (ref 0.7–4.0)
MCH: 29.9 pg (ref 26.0–34.0)
MCHC: 33.1 g/dL (ref 30.0–36.0)
MCV: 90.3 fL (ref 80.0–100.0)
Monocytes Absolute: 1.4 10*3/uL — ABNORMAL HIGH (ref 0.1–1.0)
Monocytes Relative: 7 %
Neutro Abs: 15 10*3/uL — ABNORMAL HIGH (ref 1.7–7.7)
Neutrophils Relative %: 73 %
Platelets: 345 10*3/uL (ref 150–400)
RBC: 4.52 MIL/uL (ref 3.87–5.11)
RDW: 13.7 % (ref 11.5–15.5)
WBC: 20.5 10*3/uL — ABNORMAL HIGH (ref 4.0–10.5)
nRBC: 0 % (ref 0.0–0.2)

## 2023-12-30 LAB — BASIC METABOLIC PANEL WITH GFR
Anion gap: 13 (ref 5–15)
BUN: 25 mg/dL — ABNORMAL HIGH (ref 6–20)
CO2: 26 mmol/L (ref 22–32)
Calcium: 9.5 mg/dL (ref 8.9–10.3)
Chloride: 96 mmol/L — ABNORMAL LOW (ref 98–111)
Creatinine, Ser: 0.82 mg/dL (ref 0.44–1.00)
GFR, Estimated: >60 mL/min (ref >60)
Glucose, Bld: 147 mg/dL — ABNORMAL HIGH (ref 70–99)
Potassium: 3.4 mmol/L — ABNORMAL LOW (ref 3.5–5.1)
Sodium: 135 mmol/L (ref 135–145)

## 2023-12-30 LAB — GLUCOSE, CAPILLARY
Glucose-Capillary: 171 mg/dL — ABNORMAL HIGH (ref 70–99)
Glucose-Capillary: 240 mg/dL — ABNORMAL HIGH (ref 70–99)

## 2023-12-30 LAB — RESP PANEL BY RT-PCR (RSV, FLU A&B, COVID)  RVPGX2
Influenza A by PCR: NEGATIVE
Influenza B by PCR: NEGATIVE
Resp Syncytial Virus by PCR: NEGATIVE
SARS Coronavirus 2 by RT PCR: NEGATIVE

## 2023-12-30 LAB — GROUP A STREP BY PCR: Group A Strep by PCR: DETECTED — AB

## 2023-12-30 LAB — LACTIC ACID, PLASMA: Lactic Acid, Venous: 1.2 mmol/L (ref 0.5–1.9)

## 2023-12-30 MED ORDER — SODIUM CHLORIDE 0.9 % IV SOLN: 2.0000 g | INTRAVENOUS | Status: DC

## 2023-12-30 MED ORDER — MORPHINE SULFATE (PF) 2 MG/ML IV SOLN
2.0000 mg | INTRAVENOUS | Status: DC | PRN
  Administered 2023-12-30 – 2023-12-31 (×5): 2 mg via INTRAVENOUS
  Filled 2023-12-30 (×5): qty 1

## 2023-12-30 MED ORDER — METRONIDAZOLE 500 MG/100ML IV SOLN
500.0000 mg | Freq: Two times a day (BID) | INTRAVENOUS | Status: DC
  Administered 2023-12-31 – 2024-01-02 (×5): 500 mg via INTRAVENOUS
  Filled 2023-12-30 (×5): qty 100

## 2023-12-30 MED ORDER — INSULIN ASPART 100 UNIT/ML IJ SOLN
0.0000 [IU] | Freq: Four times a day (QID) | INTRAMUSCULAR | Status: DC
  Administered 2023-12-30: 5 [IU] via SUBCUTANEOUS
  Administered 2023-12-30 – 2023-12-31 (×2): 3 [IU] via SUBCUTANEOUS
  Administered 2023-12-31: 2 [IU] via SUBCUTANEOUS
  Administered 2023-12-31 – 2024-01-01 (×2): 3 [IU] via SUBCUTANEOUS
  Administered 2024-01-01: 2 [IU] via SUBCUTANEOUS

## 2023-12-30 MED ORDER — SODIUM CHLORIDE 0.9 % IV SOLN
2.0000 g | Freq: Once | INTRAVENOUS | Status: AC
  Administered 2023-12-30: 2 g via INTRAVENOUS
  Filled 2023-12-30: qty 20

## 2023-12-30 MED ORDER — FENTANYL CITRATE PF 50 MCG/ML IJ SOSY
25.0000 ug | PREFILLED_SYRINGE | Freq: Once | INTRAMUSCULAR | Status: AC
  Administered 2023-12-30: 25 ug via INTRAVENOUS
  Filled 2023-12-30: qty 1

## 2023-12-30 MED ORDER — ACETAMINOPHEN 650 MG RE SUPP: 650.0000 mg | Freq: Four times a day (QID) | RECTAL | Status: DC | PRN

## 2023-12-30 MED ORDER — DEXAMETHASONE SODIUM PHOSPHATE 10 MG/ML IJ SOLN
10.0000 mg | Freq: Once | INTRAMUSCULAR | Status: AC
  Administered 2023-12-30: 10 mg
  Filled 2023-12-30: qty 1

## 2023-12-30 MED ORDER — LORAZEPAM 2 MG/ML IJ SOLN
0.5000 mg | Freq: Every evening | INTRAMUSCULAR | Status: DC | PRN
  Administered 2023-12-30: 0.5 mg via INTRAVENOUS
  Filled 2023-12-30 (×2): qty 1

## 2023-12-30 MED ORDER — METRONIDAZOLE 500 MG/100ML IV SOLN
500.0000 mg | Freq: Once | INTRAVENOUS | Status: AC
  Administered 2023-12-30: 500 mg via INTRAVENOUS
  Filled 2023-12-30: qty 100

## 2023-12-30 MED ORDER — IOHEXOL 300 MG/ML  SOLN
75.0000 mL | Freq: Once | INTRAMUSCULAR | Status: AC | PRN
  Administered 2023-12-30: 75 mL via INTRAVENOUS

## 2023-12-30 MED ORDER — POTASSIUM CHLORIDE IN NACL 40-0.9 MEQ/L-% IV SOLN
INTRAVENOUS | Status: DC
  Filled 2023-12-30 (×2): qty 1000

## 2023-12-30 MED ORDER — ENOXAPARIN SODIUM 40 MG/0.4ML IJ SOSY: 40.0000 mg | PREFILLED_SYRINGE | INTRAMUSCULAR | Status: DC

## 2023-12-30 MED ORDER — ACETAMINOPHEN 325 MG PO TABS
650.0000 mg | ORAL_TABLET | Freq: Four times a day (QID) | ORAL | Status: DC | PRN
  Administered 2023-12-31: 650 mg via ORAL
  Filled 2023-12-30: qty 2

## 2023-12-30 MED ORDER — POLYETHYLENE GLYCOL 3350 17 G PO PACK
17.0000 g | PACK | Freq: Every day | ORAL | Status: DC | PRN
Start: 2023-12-30 — End: 2024-01-02
  Administered 2024-01-01: 17 g via ORAL
  Filled 2023-12-30: qty 1

## 2023-12-30 MED ORDER — LABETALOL HCL 5 MG/ML IV SOLN: 10.0000 mg | INTRAVENOUS | Status: DC | PRN

## 2023-12-30 MED ORDER — ENOXAPARIN SODIUM 60 MG/0.6ML IJ SOSY
60.0000 mg | PREFILLED_SYRINGE | INTRAMUSCULAR | Status: DC
  Administered 2023-12-30 – 2024-01-01 (×3): 60 mg via SUBCUTANEOUS
  Filled 2023-12-30 (×3): qty 0.6

## 2023-12-30 MED ORDER — ONDANSETRON HCL 4 MG PO TABS: 4.0000 mg | ORAL_TABLET | Freq: Four times a day (QID) | ORAL | Status: DC | PRN

## 2023-12-30 MED ORDER — ONDANSETRON HCL 4 MG/2ML IJ SOLN: 4.0000 mg | Freq: Four times a day (QID) | INTRAMUSCULAR | Status: DC | PRN

## 2023-12-30 MED ORDER — DEXAMETHASONE SODIUM PHOSPHATE 10 MG/ML IJ SOLN
8.0000 mg | Freq: Three times a day (TID) | INTRAMUSCULAR | Status: DC
  Administered 2023-12-30 – 2024-01-02 (×8): 8 mg via INTRAVENOUS
  Filled 2023-12-30 (×10): qty 0.8

## 2023-12-30 MED ORDER — SODIUM CHLORIDE 0.9 % IV BOLUS
1000.0000 mL | Freq: Once | INTRAVENOUS | Status: AC
  Administered 2023-12-30: 1000 mL via INTRAVENOUS

## 2023-12-30 NOTE — ED Provider Notes (Signed)
 [facility] EMERGENCY DEPARTMENT AT [facility] Provider Note   CSN: [id number] Arrival date & time: 12/30/23  1145     History  Chief Complaint  Patient presents with   Dysphagia    Brenda Taylor is a 53 y.o. female.  She is history of diabetes, hypertension, obesity.  Presents to ER for left-sided sore throat that started at 3 AM and woke her from sleep.  She having pain with swallowing but is able to swallow.  She denies fever or chills,  HPI     Home Medications Prior to Admission medications   Medication Sig Start Date End Date Taking? Authorizing Provider  ALPRAZolam  (XANAX ) 0.5 MG tablet TAKE ONE TABLET BY MOUTH AT BEDTIME AS NEEDED FOR ANXIETY. 12/24/23   Tobi Fortes, MD  benzonatate  (TESSALON ) 200 MG capsule Take 1 capsule (200 mg total) by mouth 2 (two) times daily as needed for cough. Patient taking differently: Take 200 mg by mouth in the morning and at bedtime. 11/06/23   Meldon Sport, MD  Blood Glucose Monitoring Suppl DEVI 1 each by Does not apply route in the morning, at noon, and at bedtime. May substitute to any manufacturer covered by patient's insurance. 12/09/23   Meldon Sport, MD  busPIRone  (BUSPAR ) 15 MG tablet TAKE 1 TABLET BY MOUTH ONCE DAILY. 05/02/23   Meldon Sport, MD  Glucose Blood (BLOOD GLUCOSE TEST STRIPS) STRP 1 each by In Vitro route in the morning, at noon, and at bedtime. May substitute to any manufacturer covered by patient's insurance. 12/09/23 06/26/24  Meldon Sport, MD  hydrochlorothiazide  (HYDRODIURIL ) 25 MG tablet TAKE 1 TABLET BY MOUTH ONCE A DAY. 12/04/23   Meldon Sport, MD  Lancet Device MISC 1 each by Does not apply route in the morning, at noon, and at bedtime. May substitute to any manufacturer covered by patient's insurance. 12/09/23 01/08/24  Meldon Sport, MD  Lancets Misc. MISC 1 each by Does not apply route in the morning, at noon, and at bedtime. May substitute to any manufacturer covered by patient's  insurance. 12/09/23 01/08/24  Meldon Sport, MD  lidocaine  (LIDODERM ) 5 % Place 1 patch onto the skin daily. Remove & Discard patch within 12 hours or as directed by MD Patient not taking: Reported on 11/25/2023 11/12/23   Iva Mariner, MD  metFORMIN  (GLUCOPHAGE -XR) 500 MG 24 hr tablet TAKE ONE TABLET (=500MG  TOTAL) EVERY DAY WITH BREAKFAST 12/24/23   Meldon Sport, MD  methocarbamol  (ROBAXIN ) 500 MG tablet Take 1 tablet (500 mg total) by mouth every 6 (six) hours as needed for muscle spasms. 12/04/23   Easter Golden Caylin, PA-C  olopatadine  (PATADAY ) 0.1 % ophthalmic solution Place 1 drop into both eyes 2 (two) times daily. Patient not taking: Reported on 12/09/2023 01/01/23   Meldon Sport, MD  oxyCODONE  (OXY IR/ROXICODONE ) 5 MG immediate release tablet Take 5-10 mg by mouth every 6 (six) hours as needed (pain). 12/27/23   [provider]  pantoprazole  (PROTONIX ) 40 MG tablet TAKE (1) TABLET BY MOUTH TWICE DAILY BEFORE MEALS. Patient taking differently: Take 40 mg by mouth daily. 11/06/23   Vinetta Greening, DO  predniSONE  (STERAPRED UNI-PAK 21 TAB) 10 MG (21) TBPK tablet Take by mouth daily. Take 6 tabs by mouth daily  for 2 days, then 5 tabs for 2 days, then 4 tabs for 2 days, then 3 tabs for 2 days, 2 tabs for 2 days, then 1 tab by mouth daily for  2 days Patient not taking: Reported on 11/25/2023 11/12/23   Iva Mariner, MD  pregabalin  (LYRICA ) 75 MG capsule Take 1 capsule by mouth 2 (two) times daily. 11/13/23   [provider]  rosuvastatin  (CRESTOR ) 5 MG tablet TAKE ONE TABLET BY MOUTH ONCE DAILY. 11/11/23   Meldon Sport, MD      Allergies    Aspirin, Nsaids, and Amoxicillin    Review of Systems   Review of Systems  Physical Exam Updated Vital Signs BP (!) 150/89   Pulse 92   Temp 98.5 F (36.9 C) (Oral)   Resp 18   Ht 5\' 7"  (1.702 m)   Wt 124.3 kg   LMP  (LMP Unknown)   SpO2 98%   BMI 42.92 kg/m  Physical Exam Vitals and nursing note reviewed.  Constitutional:       General: She is not in acute distress.    Appearance: She is well-developed.  HENT:     Head: Normocephalic and atraumatic.     Mouth/Throat:     Mouth: Mucous membranes are moist.     Comments: Patient has some trismus from recent cervical spine surgery, partially visualize posterior pharynx shows edema left greater than right.  Patient speaks in full and clear sentences. Eyes:     Conjunctiva/sclera: Conjunctivae normal.  Cardiovascular:     Rate and Rhythm: Normal rate and regular rhythm.     Heart sounds: No murmur heard. Pulmonary:     Effort: Pulmonary effort is normal. No respiratory distress.     Breath sounds: Normal breath sounds.  Abdominal:     Palpations: Abdomen is soft.     Tenderness: There is no abdominal tenderness.  Musculoskeletal:        General: No swelling.     Cervical back: Neck supple.  Skin:    General: Skin is warm and dry.     Capillary Refill: Capillary refill takes less than 2 seconds.  Neurological:     General: No focal deficit present.     Mental Status: She is alert and oriented to person, place, and time.  Psychiatric:        Mood and Affect: Mood normal.     ED Results / Procedures / Treatments   Labs (all labs ordered are listed, but only abnormal results are displayed) Labs Reviewed  GROUP A STREP BY PCR - Abnormal; Notable for the following components:      Result Value   Group A Strep by PCR DETECTED (*)    All other components within normal limits  CBC WITH DIFFERENTIAL/PLATELET - Abnormal; Notable for the following components:   WBC 20.5 (*)    Neutro Abs 15.0 (*)    Monocytes Absolute 1.4 (*)    Abs Immature Granulocytes 0.24 (*)    All other components within normal limits  BASIC METABOLIC PANEL WITH GFR - Abnormal; Notable for the following components:   Potassium 3.4 (*)    Chloride 96 (*)    Glucose, Bld 147 (*)    BUN 25 (*)    All other components within normal limits  RESP PANEL BY RT-PCR (RSV, FLU A&B,  COVID)  RVPGX2  CULTURE, BLOOD (ROUTINE X 2)  CULTURE, BLOOD (ROUTINE X 2)  LACTIC ACID, PLASMA    EKG None  Radiology CT Soft Tissue Neck W Contrast Result Date: 12/30/2023 CLINICAL DATA:  Epiglottitis or tonsillitis suspected. Trismus, strep+, ?pta. EXAM: CT NECK WITH CONTRAST TECHNIQUE: Multidetector CT imaging of the neck was performed  using the standard protocol following the bolus administration of intravenous contrast. RADIATION DOSE REDUCTION: This exam was performed according to the departmental dose-optimization program which includes automated exposure control, adjustment of the mA and/or kV according to patient size and/or use of iterative reconstruction technique. CONTRAST:  75mL OMNIPAQUE IOHEXOL 300 MG/ML  SOLN COMPARISON:  None Available. FINDINGS: Pharynx and larynx: Asymmetric enlargement of the left palatine tonsil with regional low-density edema extending inferiorly and posteriorly in the oropharynx. 1.9 x 1.6 x 2.1 cm fluid collection along the anterolateral aspect of the tonsil. Symmetric prominence of the posterior nasopharyngeal soft tissues. Moderate narrowing of the upper oropharyngeal airway. No significant epiglottic swelling. No retropharyngeal fluid collection. Salivary glands: Asymmetric enlargement of the left submandibular gland with mild surrounding stranding. Unremarkable parotid glands. Thyroid: Unremarkable. Lymph nodes: Mildly enlarged level II lymph nodes measuring up to 1.3 cm in short axis bilaterally, likely reactive. Vascular: Mild-to-moderate atherosclerosis about the right greater than left carotid bifurcations. Limited intracranial: Unremarkable. Visualized orbits: Unremarkable. Mastoids and visualized paranasal sinuses: Mild scattered mucosal thickening in left greater than right ethmoid air cells. Bilateral mastoid effusions. Partial left middle ear opacification. Skeleton: C3-C7 ACDF.  Dental caries.  Absent maxillary dentition. Upper chest: Clear lung  apices. Other: None. IMPRESSION: 1. Acute tonsillitis with a 2 cm left peritonsillar abscess. 2. Suspected acute left submandibular sialoadenitis. 3. Bilateral mastoid and left middle ear effusions. Electronically Signed   By: Aundra Lee M.D.   On: 12/30/2023 15:00    Procedures Procedures    Medications Ordered in ED Medications  morphine  (PF) 2 MG/ML injection 2 mg (has no administration in time range)  sodium chloride  0.9 % bolus 1,000 mL (0 mLs Intravenous Stopped 12/30/23 1417)  dexamethasone  (DECADRON ) injection 10 mg (10 mg Other Given 12/30/23 1317)  iohexol (OMNIPAQUE) 300 MG/ML solution 75 mL (75 mLs Intravenous Contrast Given 12/30/23 1412)  metroNIDAZOLE (FLAGYL) IVPB 500 mg (0 mg Intravenous Stopped 12/30/23 1631)  cefTRIAXone (ROCEPHIN) 2 g in sodium chloride  0.9 % 100 mL IVPB (0 g Intravenous Stopped 12/30/23 1600)  fentaNYL  (SUBLIMAZE ) injection 25 mcg (25 mcg Intravenous Given 12/30/23 1643)    ED Course/ Medical Decision Making/ A&P                                 Medical Decision Making Differential diagnosis includes but limited to otitis, peritonsillar abscess, retropharyngeal abscess, GERD, URI, other  Course: Patient with sore throat that started suddenly this morning.  Woke her from sleep.  She was with elevated heart rate.  She is given IV fluids, obtain labs, had leukocytosis of 20.5, when I recognized that she met sepsis criteria ordered lactic acid, blood cultures and given her likely peritonsillar abscess was given IV antibiotics.  She is unfortunately penicillin allergic so she was given Flagyl and Rocephin.  C/o neck fusion interpreted by me, approximately 2 cm left peritonsillar abscess, agree with radiology read.  Labs show leukocytosis of 20.5, lactic acid normal, positive for group A strep  I consulted ENT and spoke with Dr. Westley Hammers, as the patient could be managed medically and does not need any emergent procedure.  She advised admission with IV antibiotics  and dexamethasone  8 mg every 8 x 3 doses  afternoon versus outpatient management with a prednisone  taper, 3 times daily clindamycin  and follow-up in the office on Wednesday afternoon.  I discussed with patient, she meets sepsis criteria, she is diabetic is getting  steroid dosing so feel admission is most appropriate for her and she is agreeable.  She has had improvement of her symptoms after the dexamethasone .  Discussed with hospitalist for admission to [facility] in case of worsening symptoms requiring ENT intervention.  Amount and/or Complexity of Data Reviewed Labs: ordered. Decision-making details documented in ED Course. Radiology: ordered. Decision-making details documented in ED Course.  Risk Prescription drug management. Decision regarding hospitalization.           Final Clinical Impression(s) / ED Diagnoses Final diagnoses:  None    Rx / DC Orders ED Discharge Orders     None         Aimee Houseman, PA-C 12/30/23 1710    Teddi Favors, DO 12/31/23 [id number]

## 2023-12-30 NOTE — Assessment & Plan Note (Signed)
 Stable. -Hold home HCTZ, she is unable to swallow -As needed labetalol  10mg  for systolic greater than 170

## 2023-12-30 NOTE — Assessment & Plan Note (Addendum)
 Controlled, recent A1c 11/06/2023 -6.4. - SSI- M q6hr - Hold home metformin  -Monitor closely while on steroids

## 2023-12-30 NOTE — Transitions of Care (Post Inpatient/ED Visit) (Signed)
 12/30/2023  Patient ID: Brenda Taylor, female   DOB: 21-May-1971, 53 y.o.   MRN: [id number]  Preparing to call patient for 3rd TOC attempt. Noted that patient is in the ED.  Plan: will follow up within 24 hours.  Orpha Blade, RN, BSN, CEN [employer]- Transition of Care Team.  [facility] [phone]

## 2023-12-30 NOTE — ED Notes (Signed)
 [facility] called to transport patient. Nurse made aware.

## 2023-12-30 NOTE — Assessment & Plan Note (Addendum)
 Peritonsillar abscess with sepsis.  Meets sepsis criteria with tachycardia heart rate 102-123, leukocytosis of 20.5.  Lactic acid 1.2.  Group A strep-positive.  CT soft tissue neck-acute tonsillitis, 2 cm left peritonsillar abscess, acute left submandibular sialoadenitis.  - EDP talked to ENT- Dr. Westley Hammers- admit to [facility], IV antibiotics, and steroids. -IV ceftriaxone and metronidazole - IV dexamethasone  8mg  TID - 1 L bolus given, N/s + 40 kcl 100cc/hr x 15hrs - Follow-up blood cultures - Liquid diet

## 2023-12-30 NOTE — H&P (Addendum)
 History and Physical    Brenda Taylor:[id number] DOB: 1970/10/06 DOA: 12/30/2023  PCP: Meldon Sport, MD   Patient coming from: Home  I have personally briefly reviewed patient's old medical records in [facility]  Chief Complaint: Sorethroat  HPI: Brenda Taylor is a 53 y.o. female with medical history significant for diabetes mellitus, hypertension, gastric ulcers. Patient presented to the ED with complaints of sudden onset of pain to the left side of her neck that started about 3 AM this morning and woke up from sleep.  She reports today she was unable to swallow food or pills for fear of choking, due to severe pain.  Reports some mild difficulty getting air into her lungs due to pain. No fevers no chills.  Recent hospitalization 5/20 to 5/15 under neurosurgery service for anterior cervical decompression/discectomy/fusion, by Dr. Andy Bannister  ED Course: Temperature 99.7.  Heart rate 102-123.  Respirate rate 18.  Blood pressure systolic 110-148.  O2 sats greater 98% on room air. Leukocytosis of 20.5. Group A strep positive. Lactic acid CT soft tissue neck with contrast-acute tonsillitis with a 2 cm left peritonsillar abscess, suspected acute left submandibular sialoadenitis. Bilateral mastoid and left middle ear effusions. Patient started on IV ceftriaxone and metronidazole. 1 L bolus given. EDP talked to ENT- Dr. Westley Hammers- manage medically-dexamethasone  10mg  Q8 hourly, and IV antibiotics, if patient will be admitted, recommended admission to [facility], in case patient's needs drainage of abscess.    Review of Systems: As per HPI all other systems reviewed and negative.  Past Medical History:  Diagnosis Date   Anxiety    Arthritis    Chronic pain    DDD (degenerative disc disease)    Depression    Diabetes mellitus without complication (HCC)    GERD (gastroesophageal reflux disease)    HBP (high blood pressure)    Hernia    History of hiatal hernia    Multiple gastric  ulcers    Neuromuscular disorder (HCC)    Neuropathy bilateral feet and hands   Rotator cuff syndrome of left shoulder 10/09/2011   Ulcer disease     Past Surgical History:  Procedure Laterality Date   ANTERIOR CERVICAL DECOMP/DISCECTOMY FUSION N/A 12/03/2023   Procedure: ANTERIOR CERVICAL DECOMPRESSION/DISCECTOMY FUSION CERVICAL THREE-FOUR CERVICAL FOUR-FIVE CERVICAL FIVE-SIX EXPLORE FUSION SIX-SEVEN;  Surgeon: Van Gelinas, MD;  Location: [facility] OR;  Service: Neurosurgery;  Laterality: N/A;  3C   APPENDECTOMY     BACK SURGERY     x5   BIOPSY  05/04/2021   Procedure: BIOPSY;  Surgeon: Vinetta Greening, DO;  Location: [facility] END[facility] SUITE;  Service: Endoscopy;;   CARPAL TUNNEL RELEASE     bilateral   CHOLECYSTECTOMY     COLONOSCOPY  10/20/2009   NFA:OZHYQMV anal canal/left-side diverticula and multiple polyps (hyperplastic). poor prep compromised exam. Next TCS 09/2014.   COLONOSCOPY WITH PROPOFOL  N/A 05/04/2021   Procedure: COLONOSCOPY WITH PROPOFOL ;  Surgeon: Vinetta Greening, DO;  Location: [facility];  Service: Endoscopy;  Laterality: N/A;  9:45am   ESOPHAGOGASTRODUODENOSCOPY  03/06/2010   HQI:ONGE quadrant distal esophageal/patent tubular esophagus/small HH, antral erosions and ulcerations. Bx negative for persistent H.pylori   ESOPHAGOGASTRODUODENOSCOPY  10/20/2009   XBM:WUXLK HH/prepyloric antral ulcer s/p bx (+h.pylori), ERE   ESOPHAGOGASTRODUODENOSCOPY (EGD) WITH PROPOFOL  N/A 05/04/2021   Procedure: ESOPHAGOGASTRODUODENOSCOPY (EGD) WITH PROPOFOL ;  Surgeon: Vinetta Greening, DO;  Location: [facility];  Service: Endoscopy;  Laterality: N/A;   INTRAUTERINE DEVICE INSERTION  LEEP N/A 02/14/2021   Procedure: LOOP ELECTROSURGICAL EXCISION PROCEDURE (LEEP);  Surgeon: Ozan, Jennifer, DO;  Location: [facility] ORS;  Service: Gynecology;  Laterality: N/A;   MASTECTOMY, PARTIAL Left    x 2   MOUTH SURGERY     All top teeth removed and 6-8 of bottom teeth removed   POLYPECTOMY   05/04/2021   Procedure: POLYPECTOMY;  Surgeon: Vinetta Greening, DO;  Location: [facility];  Service: Endoscopy;;   ROTATOR CUFF REPAIR     right   TUBAL LIGATION       reports that she has been smoking cigarettes. She has been exposed to tobacco smoke. She has never used smokeless tobacco. She reports that she does not currently use drugs after having used the following drugs: Marijuana. Frequency: 7.00 times per week. She reports that she does not drink alcohol.  Allergies  Allergen Reactions   Aspirin Other (See Comments)    Cannot take due to ulcers   Nsaids Other (See Comments)    Cannot take due to ulcers    Amoxicillin Hives    Family History  Problem Relation Age of Onset   Heart disease Other    Arthritis Other    Cancer Other    Asthma Other    Diabetes Other    Heart disease Mother    Diabetes Mother    Cancer Mother    Non-Hodgkin's lymphoma Mother    Hypertension Mother    Arthritis Mother    Lung cancer Father    Neuropathy Father    Hypertension Father     Prior to Admission medications   Medication Sig Start Date End Date Taking? Authorizing Provider  ALPRAZolam  (XANAX ) 0.5 MG tablet TAKE ONE TABLET BY MOUTH AT BEDTIME AS NEEDED FOR ANXIETY. 12/24/23   Tobi Fortes, MD  benzonatate  (TESSALON ) 200 MG capsule Take 1 capsule (200 mg total) by mouth 2 (two) times daily as needed for cough. Patient taking differently: Take 200 mg by mouth in the morning and at bedtime. 11/06/23   Meldon Sport, MD  Blood Glucose Monitoring Suppl DEVI 1 each by Does not apply route in the morning, at noon, and at bedtime. May substitute to any manufacturer covered by patient's insurance. 12/09/23   Meldon Sport, MD  busPIRone  (BUSPAR ) 15 MG tablet TAKE 1 TABLET BY MOUTH ONCE DAILY. 05/02/23   Meldon Sport, MD  Glucose Blood (BLOOD GLUCOSE TEST STRIPS) STRP 1 each by In Vitro route in the morning, at noon, and at bedtime. May substitute to any manufacturer covered by  patient's insurance. 12/09/23 06/26/24  Meldon Sport, MD  hydrochlorothiazide  (HYDRODIURIL ) 25 MG tablet TAKE 1 TABLET BY MOUTH ONCE A DAY. 12/04/23   Meldon Sport, MD  Lancet Device MISC 1 each by Does not apply route in the morning, at noon, and at bedtime. May substitute to any manufacturer covered by patient's insurance. 12/09/23 01/08/24  Meldon Sport, MD  Lancets Misc. MISC 1 each by Does not apply route in the morning, at noon, and at bedtime. May substitute to any manufacturer covered by patient's insurance. 12/09/23 01/08/24  Meldon Sport, MD  lidocaine  (LIDODERM ) 5 % Place 1 patch onto the skin daily. Remove & Discard patch within 12 hours or as directed by MD Patient not taking: Reported on 11/25/2023 11/12/23   Iva Mariner, MD  metFORMIN  (GLUCOPHAGE -XR) 500 MG 24 hr tablet TAKE ONE TABLET (=500MG  TOTAL) EVERY DAY WITH BREAKFAST 12/24/23   Lydia Sams, Mariano Shiver  K, MD  methocarbamol  (ROBAXIN ) 500 MG tablet Take 1 tablet (500 mg total) by mouth every 6 (six) hours as needed for muscle spasms. 12/04/23   Easter Golden Caylin, PA-C  olopatadine  (PATADAY ) 0.1 % ophthalmic solution Place 1 drop into both eyes 2 (two) times daily. Patient not taking: Reported on 12/09/2023 01/01/23   Meldon Sport, MD  oxyCODONE  (OXY IR/ROXICODONE ) 5 MG immediate release tablet Take 5-10 mg by mouth every 6 (six) hours as needed (pain). 12/27/23   [provider]  pantoprazole  (PROTONIX ) 40 MG tablet TAKE (1) TABLET BY MOUTH TWICE DAILY BEFORE MEALS. Patient taking differently: Take 40 mg by mouth daily. 11/06/23   Vinetta Greening, DO  predniSONE  (STERAPRED UNI-PAK 21 TAB) 10 MG (21) TBPK tablet Take by mouth daily. Take 6 tabs by mouth daily  for 2 days, then 5 tabs for 2 days, then 4 tabs for 2 days, then 3 tabs for 2 days, 2 tabs for 2 days, then 1 tab by mouth daily for 2 days Patient not taking: Reported on 11/25/2023 11/12/23   Iva Mariner, MD  pregabalin  (LYRICA ) 75 MG capsule Take 1 capsule by mouth 2 (two)  times daily. 11/13/23   [provider]  rosuvastatin  (CRESTOR ) 5 MG tablet TAKE ONE TABLET BY MOUTH ONCE DAILY. 11/11/23   Meldon Sport, MD    Physical Exam: Vitals:   12/30/23 1152 12/30/23 1153 12/30/23 1430  BP:  110/83 (!) 148/85  Pulse:  (!) 123 (!) 102  Resp:  19 18  Temp:  99.7 F (37.6 C) 98.3 F (36.8 C)  TempSrc:  Oral Oral  SpO2:  98% 98%  Weight: 124.3 kg    Height: 5\' 7"  (1.702 m)      Constitutional: NAD, calm, comfortable Vitals:   12/30/23 1152 12/30/23 1153 12/30/23 1430  BP:  110/83 (!) 148/85  Pulse:  (!) 123 (!) 102  Resp:  19 18  Temp:  99.7 F (37.6 C) 98.3 F (36.8 C)  TempSrc:  Oral Oral  SpO2:  98% 98%  Weight: 124.3 kg    Height: 5\' 7"  (1.702 m)     Eyes: PERRL, lids and conjunctivae normal ENMT: Rigid cervical neck collar in place.  Mucous membranes are moist.  Neck: normal, supple, no masses, no thyromegaly Respiratory: No stridor, clear to auscultation bilaterally, no wheezing, no crackles. Normal respiratory effort. No accessory muscle use.  Cardiovascular: Tachycardic, regular rate and rhythm, no murmurs / rubs / gallops. No extremity edema.  Extremities warm .  Abdomen: obese, no tenderness, no masses palpated. No hepatosplenomegaly. Bowel sounds positive.  Musculoskeletal: no clubbing / cyanosis. No joint deformity upper and lower extremities.   Skin: no rashes, lesions, ulcers. No induration Neurologic: No facial asymmetry, moving extremities spontaneously, speech fluent Psychiatric: Normal judgment and insight. Alert and oriented x 3. Normal mood.   Labs on Admission: I have personally reviewed following labs and imaging studies  CBC: Recent Labs  Lab 12/30/23 1312  WBC 20.5*  NEUTROABS 15.0*  HGB 13.5  HCT 40.8  MCV 90.3  PLT 345   Basic Metabolic Panel: Recent Labs  Lab 12/30/23 1312  NA 135  K 3.4*  CL 96*  CO2 26  GLUCOSE 147*  BUN 25*  CREATININE 0.82  CALCIUM  9.5   Urine analysis:    Component  Value Date/Time   COLORURINE YELLOW 03/11/2015 1320   APPEARANCEUR CLEAR 03/11/2015 1320   LABSPEC 1.015 03/11/2015 1320   PHURINE 7.0 03/11/2015 1320  GLUCOSEU NEGATIVE 03/11/2015 1320   HGBUR NEGATIVE 03/11/2015 1320   BILIRUBINUR NEGATIVE 03/11/2015 1320   KETONESUR NEGATIVE 03/11/2015 1320   PROTEINUR NEGATIVE 03/11/2015 1320   UROBILINOGEN 0.2 03/11/2015 1320   NITRITE NEGATIVE 03/11/2015 1320   LEUKOCYTESUR NEGATIVE 03/11/2015 1320    Radiological Exams on Admission: CT Soft Tissue Neck W Contrast Result Date: 12/30/2023 CLINICAL DATA:  Epiglottitis or tonsillitis suspected. Trismus, strep+, ?pta. EXAM: CT NECK WITH CONTRAST TECHNIQUE: Multidetector CT imaging of the neck was performed using the standard protocol following the bolus administration of intravenous contrast. RADIATION DOSE REDUCTION: This exam was performed according to the departmental dose-optimization program which includes automated exposure control, adjustment of the mA and/or kV according to patient size and/or use of iterative reconstruction technique. CONTRAST:  75mL OMNIPAQUE IOHEXOL 300 MG/ML  SOLN COMPARISON:  None Available. FINDINGS: Pharynx and larynx: Asymmetric enlargement of the left palatine tonsil with regional low-density edema extending inferiorly and posteriorly in the oropharynx. 1.9 x 1.6 x 2.1 cm fluid collection along the anterolateral aspect of the tonsil. Symmetric prominence of the posterior nasopharyngeal soft tissues. Moderate narrowing of the upper oropharyngeal airway. No significant epiglottic swelling. No retropharyngeal fluid collection. Salivary glands: Asymmetric enlargement of the left submandibular gland with mild surrounding stranding. Unremarkable parotid glands. Thyroid: Unremarkable. Lymph nodes: Mildly enlarged level II lymph nodes measuring up to 1.3 cm in short axis bilaterally, likely reactive. Vascular: Mild-to-moderate atherosclerosis about the right greater than left carotid  bifurcations. Limited intracranial: Unremarkable. Visualized orbits: Unremarkable. Mastoids and visualized paranasal sinuses: Mild scattered mucosal thickening in left greater than right ethmoid air cells. Bilateral mastoid effusions. Partial left middle ear opacification. Skeleton: C3-C7 ACDF.  Dental caries.  Absent maxillary dentition. Upper chest: Clear lung apices. Other: None. IMPRESSION: 1. Acute tonsillitis with a 2 cm left peritonsillar abscess. 2. Suspected acute left submandibular sialoadenitis. 3. Bilateral mastoid and left middle ear effusions. Electronically Signed   By: Aundra Lee M.D.   On: 12/30/2023 15:00    EKG:  NONE.   Assessment/Plan Principal Problem:   Peritonsillar abscess Active Problems:   Sepsis (HCC)   Hypertension   Diabetic neuropathy (HCC)   Assessment and Plan: * Peritonsillar abscess Peritonsillar abscess with sepsis.  Meets sepsis criteria with tachycardia heart rate 102-123, leukocytosis of 20.5.  Lactic acid 1.2.  Group A strep-positive.  CT soft tissue neck-acute tonsillitis, 2 cm left peritonsillar abscess, acute left submandibular sialoadenitis.  - EDP talked to ENT- Dr. Westley Hammers- admit to [facility], IV antibiotics, and steroids. -IV ceftriaxone and metronidazole - IV dexamethasone  8mg  TID - 1 L bolus given, N/s + 40 kcl 100cc/hr x 15hrs - Follow-up blood cultures - Liquid diet   Hypertension Stable. -Hold home HCTZ, she is unable to swallow -As needed labetalol  10mg  for systolic greater than 170  Diabetic neuropathy (HCC) Controlled, recent A1c 11/06/2023 -6.4. - SSI- M q6hr - Hold home metformin  -Monitor closely while on steroids   DVT prophylaxis: Lovenox  Code Status: FULL code Family Communication: None Disposition Plan: > 2 days Consults called: ENT Admission status: INpt Tele I certify that at the point of admission it is my clinical judgment that the patient will require inpatient hospital care spanning beyond 2 midnights from the  point of admission due to high intensity of service, high risk for further deterioration and high frequency of surveillance required.   Author: Pati Bonine, MD 12/30/2023 4:47 PM  For on call review [email or web address].

## 2023-12-30 NOTE — ED Triage Notes (Signed)
 Pt arrived via [facility] from home c/o dysphagia that began this morning. Pt reports she was unable to swallow her medications or drink anything. Pt reports recent cervicale decompression surgery and presents to triage in a neck brace, and reports she needs to be in the [facility] until July.

## 2023-12-31 DIAGNOSIS — J36 Peritonsillar abscess: Secondary | ICD-10-CM | POA: Diagnosis not present

## 2023-12-31 LAB — GLUCOSE, CAPILLARY
Glucose-Capillary: 167 mg/dL — ABNORMAL HIGH (ref 70–99)
Glucose-Capillary: 155 mg/dL — ABNORMAL HIGH (ref 70–99)
Glucose-Capillary: 135 mg/dL — ABNORMAL HIGH (ref 70–99)

## 2023-12-31 LAB — CBC
HCT: 39.2 % (ref 36.0–46.0)
Hemoglobin: 12.9 g/dL (ref 12.0–15.0)
MCH: 29.1 pg (ref 26.0–34.0)
MCHC: 32.9 g/dL (ref 30.0–36.0)
MCV: 88.5 fL (ref 80.0–100.0)
Platelets: 367 10*3/uL (ref 150–400)
RBC: 4.43 MIL/uL (ref 3.87–5.11)
RDW: 13.6 % (ref 11.5–15.5)
WBC: 20.3 10*3/uL — ABNORMAL HIGH (ref 4.0–10.5)
nRBC: 0 % (ref 0.0–0.2)

## 2023-12-31 LAB — BASIC METABOLIC PANEL WITH GFR
Anion gap: 11 (ref 5–15)
BUN: 16 mg/dL (ref 6–20)
CO2: 23 mmol/L (ref 22–32)
Calcium: 9.1 mg/dL (ref 8.9–10.3)
Chloride: 102 mmol/L (ref 98–111)
Creatinine, Ser: 0.74 mg/dL (ref 0.44–1.00)
GFR, Estimated: >60 mL/min (ref >60)
Glucose, Bld: 168 mg/dL — ABNORMAL HIGH (ref 70–99)
Potassium: 3.8 mmol/L (ref 3.5–5.1)
Sodium: 136 mmol/L (ref 135–145)

## 2023-12-31 LAB — HIV ANTIBODY (ROUTINE TESTING W REFLEX): HIV Screen 4th Generation wRfx: NONREACTIVE

## 2023-12-31 MED ORDER — SODIUM CHLORIDE 0.9 % IV SOLN
2.0000 g | Freq: Three times a day (TID) | INTRAVENOUS | Status: DC
  Administered 2023-12-31 – 2024-01-02 (×7): 2 g via INTRAVENOUS
  Filled 2023-12-31 (×7): qty 12.5

## 2023-12-31 MED ORDER — SODIUM CHLORIDE 0.9 % IV SOLN: INTRAVENOUS | Status: DC

## 2023-12-31 NOTE — Consult Note (Signed)
 ENT CONSULT:  Reason for Consult: Peritonsillar abscess  Referring Physician:  Baxter Limber PA-C  HPI: Brenda Taylor is an 53 y.o. female who presented to [facility] on 12/30/2023 with reports of sudden onset of left-sided sore throat.  Patient reported odynophagia without dysphagia, no fevers or chills.  She had a CT neck with contrast performed in the ED which reported an approximately 2 cm left peritonsillar abscess.  Patient recently had anterior cervical spine fusion with Dr. 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  Per ED provider, patient had clear voice, without muffling, with some pharyngeal edema noted on examination.  Exam was limited secondary to trismus.  Decision was made to transfer to [facility] for possible I&D while undergoing conservative management with IV antibiotics and steroids.  Patient notes significant improvement in overall condition since admission. She reports marked reduction in throat pain and improved swallow.    Past Medical History:  Diagnosis Date   Anxiety    Arthritis    Chronic pain    DDD (degenerative disc disease)    Depression    Diabetes mellitus without complication (HCC)    GERD (gastroesophageal reflux disease)    HBP (high blood pressure)    Hernia    History of hiatal hernia    Multiple gastric ulcers    Neuromuscular disorder (HCC)    Neuropathy bilateral feet and hands   Rotator cuff syndrome of left shoulder 10/09/2011   Ulcer disease     Past Surgical History:  Procedure Laterality Date   ANTERIOR CERVICAL DECOMP/DISCECTOMY FUSION N/A 12/03/2023   Procedure: ANTERIOR CERVICAL DECOMPRESSION/DISCECTOMY FUSION CERVICAL THREE-FOUR CERVICAL FOUR-FIVE CERVICAL FIVE-SIX EXPLORE FUSION SIX-SEVEN;  Surgeon: Van Gelinas, MD;  Location: [facility] OR;  Service: Neurosurgery;  Laterality: N/A;  3C   APPENDECTOMY     BACK SURGERY     x5   BIOPSY  05/04/2021   Procedure: BIOPSY;  Surgeon: Vinetta Greening, DO;   Location: [facility] END[facility] SUITE;  Service: Endoscopy;;   CARPAL TUNNEL RELEASE     bilateral   CHOLECYSTECTOMY     COLONOSCOPY  10/20/2009   ZOX:WRUEAVW anal canal/left-side diverticula and multiple polyps (hyperplastic). poor prep compromised exam. Next TCS 09/2014.   COLONOSCOPY WITH PROPOFOL  N/A 05/04/2021   Procedure: COLONOSCOPY WITH PROPOFOL ;  Surgeon: Vinetta Greening, DO;  Location: [facility];  Service: Endoscopy;  Laterality: N/A;  9:45am   ESOPHAGOGASTRODUODENOSCOPY  03/06/2010   UJW:JXBJ quadrant distal esophageal/patent tubular esophagus/small HH, antral erosions and ulcerations. Bx negative for persistent H.pylori   ESOPHAGOGASTRODUODENOSCOPY  10/20/2009   YNW:GNFAO HH/prepyloric antral ulcer s/p bx (+h.pylori), ERE   ESOPHAGOGASTRODUODENOSCOPY (EGD) WITH PROPOFOL  N/A 05/04/2021   Procedure: ESOPHAGOGASTRODUODENOSCOPY (EGD) WITH PROPOFOL ;  Surgeon: Vinetta Greening, DO;  Location[facility];  Service: Endoscopy;  Laterality: N/A;   INTRAUTERINE DEVICE INSERTION     LEEP N/A 02/14/2021   Procedure: LOOP ELECTROSURGICAL EXCISION PROCEDURE (LEEP);  Surgeon: Ozan, Jennifer, DO;  Location: [facility] ORS;  Service: Gynecology;  Laterality: N/A;   MASTECTOMY, PARTIAL Left    x 2   MOUTH SURGERY     All top teeth removed and 6-8 of bottom teeth removed   POLYPECTOMY  05/04/2021   Procedure: POLYPECTOMY;  Surgeon: Vinetta Greening, DO;  Location: [facility];  Service: Endoscopy;;   ROTATOR CUFF REPAIR     right   TUBAL LIGATION      Family History  Problem Relation Age of Onset  Heart disease Other    Arthritis Other    Cancer Other    Asthma Other    Diabetes Other    Heart disease Mother    Diabetes Mother    Cancer Mother    Non-Hodgkin's lymphoma Mother    Hypertension Mother    Arthritis Mother    Lung cancer Father    Neuropathy Father    Hypertension Father     Social History:  reports that she has been smoking cigarettes. She has been exposed to tobacco  smoke. She has never used smokeless tobacco. She reports that she does not currently use drugs after having used the following drugs: Marijuana. Frequency: 7.00 times per week. She reports that she does not drink alcohol.  Allergies:  Allergies  Allergen Reactions   Aspirin Other (See Comments)    Cannot take due to ulcers   Nsaids Other (See Comments)    Cannot take due to ulcers    Amoxicillin Hives    Medications: I have reviewed the patient's current medications.  Results for orders placed or performed during the hospital encounter of 12/30/23 (from the past 48 hours)  Group A Strep by PCR     Status: Abnormal   Collection Time: 12/30/23 11:53 AM   Specimen: Throat; Sterile Swab  Result Value Ref Range   Group A Strep by PCR DETECTED (A) NOT DETECTED    Comment: Performed at [facility], [address], [place], [place] [address]  Resp panel by RT-PCR (RSV, Flu A&B, Covid) Anterior Nasal Swab     Status: None   Collection Time: 12/30/23 11:54 AM   Specimen: Anterior Nasal Swab  Result Value Ref Range   SARS Coronavirus 2 by RT PCR NEGATIVE NEGATIVE    Comment: (NOTE) SARS-CoV-2 target nucleic acids are NOT DETECTED.  The SARS-CoV-2 RNA is generally detectable in upper respiratory specimens during the acute phase of infection. The lowest concentration of SARS-CoV-2 viral copies this assay can detect is 138 copies/mL. A negative result does not preclude SARS-Cov-2 infection and should not be used as the sole basis for treatment or other patient management decisions. A negative result may occur with  improper specimen collection/handling, submission of specimen other than nasopharyngeal swab, presence of viral mutation(s) within the areas targeted by this assay, and inadequate number of viral copies(<138 copies/mL). A negative result must be combined with clinical observations, patient history, and epidemiological information. The expected result is Negative.  Fact Sheet  for Patients:  BloggerCourse.com  Fact Sheet for Healthcare Providers:  SeriousBroker.it  This test is no t yet approved or cleared by the [place]  FDA and  has been authorized for detection and/or diagnosis of SARS-CoV-2 by FDA under an Emergency Use Authorization (EUA). This EUA will remain  in effect (meaning this test can be used) for the duration of the COVID-19 declaration under Section 564(b)(1) of the Act, 21 U.S.C.section 360bbb-3(b)(1), unless the authorization is terminated  or revoked sooner.       Influenza A by PCR NEGATIVE NEGATIVE   Influenza B by PCR NEGATIVE NEGATIVE    Comment: (NOTE) The Xpert Xpress SARS-CoV-2/FLU/RSV plus assay is intended as an aid in the diagnosis of influenza from Nasopharyngeal swab specimens and should not be used as a sole basis for treatment. Nasal washings and aspirates are unacceptable for Xpert Xpress SARS-CoV-2/FLU/RSV testing.  Fact Sheet for Patients: BloggerCourse.com  Fact Sheet for Healthcare Providers: SeriousBroker.it  This test is not yet approved or cleared by the [place] FDA and has been authorized for detection and/or diagnosis of SARS-CoV-2 by FDA under an Emergency Use Authorization (EUA). This EUA will remain in effect (meaning this test can be used) for the duration of the COVID-19 declaration under Section 564(b)(1) of the Act, 21 U.S.C. section 360bbb-3(b)(1), unless the authorization is terminated or revoked.     Resp Syncytial Virus by PCR NEGATIVE NEGATIVE    Comment: (NOTE) Fact Sheet for Patients: BloggerCourse.com  Fact Sheet for Healthcare Providers: SeriousBroker.it  This test is not yet approved or cleared by the [place]  FDA and has been authorized for detection and/or diagnosis of SARS-CoV-2 by FDA under an Emergency Use  Authorization (EUA). This EUA will remain in effect (meaning this test can be used) for the duration of the COVID-19 declaration under Section 564(b)(1) of the Act, 21 U.S.C. section 360bbb-3(b)(1), unless the authorization is terminated or revoked.  Performed at [facility], [address] [place], [place] [address]   CBC with Differential     Status: Abnormal   Collection Time: 12/30/23  1:12 PM  Result Value Ref Range   WBC 20.5 (H) 4.0 - 10.5 K/uL   RBC 4.52 3.87 - 5.11 MIL/uL   Hemoglobin 13.5 12.0 - 15.0 g/dL   HCT 60.4 54.0 - 98.1 %   MCV 90.3 80.0 - 100.0 fL   MCH 29.9 26.0 - 34.0 pg   MCHC 33.1 30.0 - 36.0 g/dL   RDW 19.1 47.8 - 29.5 %   Platelets 345 150 - 400 K/uL   nRBC 0.0 0.0 - 0.2 %   Neutrophils Relative % 73 %   Neutro Abs 15.0 (H) 1.7 - 7.7 K/uL   Lymphocytes Relative 17 %   Lymphs Abs 3.5 0.7 - 4.0 K/uL   Monocytes Relative 7 %   Monocytes Absolute 1.4 (H) 0.1 - 1.0 K/uL   Eosinophils Relative 1 %   Eosinophils Absolute 0.3 0.0 - 0.5 K/uL   Basophils Relative 1 %   Basophils Absolute 0.1 0.0 - 0.1 K/uL   Immature Granulocytes 1 %   Abs Immature Granulocytes 0.24 (H) 0.00 - 0.07 K/uL    Comment: Performed at [facility], [address], [place], [place] [address]  Basic metabolic panel     Status: Abnormal   Collection Time: 12/30/23  1:12 PM  Result Value Ref Range   Sodium 135 135 - 145 mmol/L   Potassium 3.4 (L) 3.5 - 5.1 mmol/L   Chloride 96 (L) 98 - 111 mmol/L   CO2 26 22 - 32 mmol/L   Glucose, Bld 147 (H) 70 - 99 mg/dL    Comment: Glucose reference range applies only to samples taken after fasting for at least 8 hours.   BUN 25 (H) 6 - 20 mg/dL   Creatinine, Ser 8.65 0.44 - 1.00 mg/dL   Calcium  9.5 8.9 - 10.3 mg/dL   GFR, Estimated >78 >46 mL/min    Comment: (NOTE) Calculated using the CKD-EPI Creatinine Equation (2021)    Anion gap 13 5 - 15    Comment: Performed at [facility], [address], [place], [place] [address]  Lactic acid,  plasma     Status: None   Collection Time: 12/30/23  3:02 PM  Result Value Ref Range   Lactic Acid, Venous 1.2 0.5 - 1.9 mmol/L    Comment: Performed at [facility], [address] [place], [place] [address]  Blood culture (routine x 2)     Status: None (Preliminary result)  Collection Time: 12/30/23  3:04 PM   Specimen: BLOOD  Result Value Ref Range   Specimen Description BLOOD BLOOD LEFT ARM    Special Requests      Blood Culture results may not be optimal due to an inadequate volume of blood received in culture bottles BOTTLES DRAWN AEROBIC AND ANAEROBIC   Culture      NO GROWTH < 24 HOURS Performed at [facility], [address], [place], [place] [address]    Report Status PENDING   Blood culture (routine x 2)     Status: None (Preliminary result)   Collection Time: 12/30/23  3:04 PM   Specimen: BLOOD  Result Value Ref Range   Specimen Description BLOOD LFOA    Special Requests      BOTTLES DRAWN AEROBIC AND ANAEROBIC Blood Culture results may not be optimal due to an inadequate volume of blood received in culture bottles   Culture      NO GROWTH < 24 HOURS Performed at [facility], [address], [place], [place] [address]    Report Status PENDING   Glucose, capillary     Status: Abnormal   Collection Time: 12/30/23  6:50 PM  Result Value Ref Range   Glucose-Capillary 240 (H) 70 - 99 mg/dL    Comment: Glucose reference range applies only to samples taken after fasting for at least 8 hours.  Glucose, capillary     Status: Abnormal   Collection Time: 12/30/23 11:36 PM  Result Value Ref Range   Glucose-Capillary 171 (H) 70 - 99 mg/dL    Comment: Glucose reference range applies only to samples taken after fasting for at least 8 hours.  HIV Antibody (routine testing w rflx)     Status: None   Collection Time: 12/31/23  5:58 AM  Result Value Ref Range   HIV Screen 4th Generation wRfx Non Reactive Non Reactive    Comment: Performed at [facility], [address] [place], [place] [address]  Basic metabolic panel     Status: Abnormal   Collection Time: 12/31/23  5:58 AM  Result Value Ref Range   Sodium 136 135 - 145 mmol/L   Potassium 3.8 3.5 - 5.1 mmol/L   Chloride 102 98 - 111 mmol/L   CO2 23 22 - 32 mmol/L   Glucose, Bld 168 (H) 70 - 99 mg/dL    Comment: Glucose reference range applies only to samples taken after fasting for at least 8 hours.   BUN 16 6 - 20 mg/dL   Creatinine, Ser 9.56 0.44 - 1.00 mg/dL   Calcium  9.1 8.9 - 10.3 mg/dL   GFR, Estimated >21 >30 mL/min    Comment: (NOTE) Calculated using the CKD-EPI Creatinine Equation (2021)    Anion gap 11 5 - 15    Comment: Performed at [facility], [address] [place], [place] [address]  CBC     Status: Abnormal   Collection Time: 12/31/23  5:58 AM  Result Value Ref Range   WBC 20.3 (H) 4.0 - 10.5 K/uL   RBC 4.43 3.87 - 5.11 MIL/uL   Hemoglobin 12.9 12.0 - 15.0 g/dL   HCT 46.9 62.9 - 52.8 %   MCV 88.5 80.0 - 100.0 fL   MCH 29.1 26.0 - 34.0 pg   MCHC 32.9 30.0 - 36.0 g/dL   RDW 41.3 24.4 - 01.0 %   Platelets 367 150 - 400 K/uL   nRBC 0.0 0.0 - 0.2 %  Comment: Performed at [facility], [address], [place], [place] [address]  Glucose, capillary     Status: Abnormal   Collection Time: 12/31/23  6:10 AM  Result Value Ref Range   Glucose-Capillary 167 (H) 70 - 99 mg/dL    Comment: Glucose reference range applies only to samples taken after fasting for at least 8 hours.  Glucose, capillary     Status: Abnormal   Collection Time: 12/31/23 11:50 AM  Result Value Ref Range   Glucose-Capillary 155 (H) 70 - 99 mg/dL    Comment: Glucose reference range applies only to samples taken after fasting for at least 8 hours.    CT Soft Tissue Neck W Contrast Result Date: 12/30/2023 CLINICAL DATA:  Epiglottitis or tonsillitis suspected. Trismus, strep+, ?pta. EXAM: CT NECK WITH CONTRAST TECHNIQUE: Multidetector CT imaging of the neck was performed using the standard protocol  following the bolus administration of intravenous contrast. RADIATION DOSE REDUCTION: This exam was performed according to the departmental dose-optimization program which includes automated exposure control, adjustment of the mA and/or kV according to patient size and/or use of iterative reconstruction technique. CONTRAST:  75mL OMNIPAQUE IOHEXOL 300 MG/ML  SOLN COMPARISON:  None Available. FINDINGS: Pharynx and larynx: Asymmetric enlargement of the left palatine tonsil with regional low-density edema extending inferiorly and posteriorly in the oropharynx. 1.9 x 1.6 x 2.1 cm fluid collection along the anterolateral aspect of the tonsil. Symmetric prominence of the posterior nasopharyngeal soft tissues. Moderate narrowing of the upper oropharyngeal airway. No significant epiglottic swelling. No retropharyngeal fluid collection. Salivary glands: Asymmetric enlargement of the left submandibular gland with mild surrounding stranding. Unremarkable parotid glands. Thyroid: Unremarkable. Lymph nodes: Mildly enlarged level II lymph nodes measuring up to 1.3 cm in short axis bilaterally, likely reactive. Vascular: Mild-to-moderate atherosclerosis about the right greater than left carotid bifurcations. Limited intracranial: Unremarkable. Visualized orbits: Unremarkable. Mastoids and visualized paranasal sinuses: Mild scattered mucosal thickening in left greater than right ethmoid air cells. Bilateral mastoid effusions. Partial left middle ear opacification. Skeleton: C3-C7 ACDF.  Dental caries.  Absent maxillary dentition. Upper chest: Clear lung apices. Other: None. IMPRESSION: 1. Acute tonsillitis with a 2 cm left peritonsillar abscess. 2. Suspected acute left submandibular sialoadenitis. 3. Bilateral mastoid and left middle ear effusions. Electronically Signed   By: Aundra Lee M.D.   On: 12/30/2023 15:00    ROS:ROS  Blood pressure 94/70, pulse 79, temperature 98 F (36.7 C), temperature source Oral, resp. rate 18,  height 5\' 7"  (1.702 m), weight 124.3 kg, SpO2 95%.  PHYSICAL EXAM:  CONSTITUTIONAL: well developed, nourished, no distress and alert and oriented x 3 PULMONARY/CHEST WALL: effort normal and no stridor, no stertor, no dysphonia HENT: Head : normocephalic and atraumatic Ears: Right ear:   canal normal, external ear normal and hearing normal Left ear:   canal normal, external ear normal and hearing normal Nose: nose normal and no purulence Mouth/Throat:  Mouth: uvula with slight deviation to the right, left palatal edema present with exudate on left tonsil Mucous membranes: normal EYES: conjunctiva normal, EOM normal and PERRL NECK: supple, trachea normal and no thyromegaly or cervical LAD  Studies Reviewed: CT neck with contrast reviewed, demonstrates 2 cm left peritonsillar abscess with associated reactive adenopathy.  Assessment/Plan: IXEL BOEHNING is a 53 y/o F with tonsillitis and left peritonsillar abscess - Patient notes significant improvement in symptoms since admission, with improved tolerance of secretions and marked reduction in throat pain. - I recommended proceeding with bedside I&D given  persistent palatal edema on examination.  Patient declined procedure and would like to continue conservative management at this time.  Given overall clinical appearance, stable oxygenation and secretions, absent trismus, this is a reasonable choice. - Continue antibiotics.  Last dose of Decadron  was received earlier today. - Patient would benefit from consistent dosing of NSAID for anti-inflammatory benefit, but given history of ulcers, will defer to primary.  Can consider Celebrex . - Okay for diet from ENT perspective.  Will reassess tomorrow afternoon.  Patient was counseled that if there is not continued improvement on physical exam, I&D will again be recommended.   Thank you for allowing me to participate in the care of this patient. Please do not hesitate to contact me with any questions or  concerns.   Daleen Dubs, DO Otolaryngology [facility] ENT Cell: [phone]   12/31/2023, 2:36 PM

## 2023-12-31 NOTE — Progress Notes (Signed)
  Progress Note   Patient: Brenda Taylor WJX:[id number] DOB: 07/23/1971 DOA: 12/30/2023     1 DOS: the patient was seen and examined on 12/31/2023    Assessment and Plan: * Peritonsillar abscess - Dr. Westley Hammers messaged 12/31/2023 as WBC is still at 20K; consider I&D - IV NS 75 cc/hr  - IV cefepime 2 g q8hr  - IV flagyl 500 mg q12  - IV morphine  2 mg q4 hr PRN - IV dexamethasone  8mg  TID - Follow-up blood cultures - Liquid diet  Hypertension -As needed labetalol  10mg  for systolic greater than 170 - Continue to monitor   Diabetic neuropathy (HCC) - Novolog  SS q6hr  Subjective: Pt seen and examined at the bedside. WBC remains at 20K despite IV ceftriaxone administration. IV cefepime/flagyl until WBC downtrends.  I have called to Dr. Jan Mcgill office and left my cell number. I have also reached out via EPIC chat to Dr. Westley Hammers directly asking about I&D and/or other ENT surgical intervention. Awaiting to hear back.   Physical Exam: Vitals:   12/30/23 1638 12/30/23 1849 12/30/23 2334 12/31/23 0608  BP: (!) 150/89 (!) 152/94 118/73 94/70  Pulse: 92 95 79   Resp: 18 18 18 18   Temp: 98.5 F (36.9 C) 97.7 F (36.5 C) 98.4 F (36.9 C) 98 F (36.7 C)  TempSrc: Oral Oral Oral Oral  SpO2: 98% 99% 95% 95%  Weight:      Height:       Physical Exam HENT:     Head: Normocephalic.     Mouth/Throat:     Mouth: Mucous membranes are moist.  Cardiovascular:     Rate and Rhythm: Normal rate.  Pulmonary:     Effort: Pulmonary effort is normal.  Abdominal:     Palpations: Abdomen is soft.  Musculoskeletal:        General: Normal range of motion.  Skin:    General: Skin is warm.  Neurological:     Mental Status: She is alert. Mental status is at baseline.  Psychiatric:        Mood and Affect: Mood normal.      Disposition: Status is: Inpatient Remains inpatient appropriate because: IV antibx and possible ENT surgical intervention   Planned Discharge Destination:  Home    Time spent: 35 minutes  Author: Marthe Dant , MD 12/31/2023 10:17 AM  For on call review [email or web address].

## 2023-12-31 NOTE — TOC Initial Note (Signed)
 Transition of Care (TOC) - Initial/Assessment Note   Spoke to patient at bedside. From home with boyfriend and three grandchildren ages 1, 16 and 9 years ld.   Patient PCP Cleola Dach  Patient has transportation to appointments   Patient has rolling walker, 3 in 1 and cane at home.     Transition of Care Department [facility]) has reviewed patient and no TOC needs have been identified at this time. We will continue to monitor patient advancement through interdisciplinary progression rounds. If new patient transition needs arise, please place a TOC consult.   Patient Details  Name: Brenda Taylor MRN: [id number] Date of Birth: [birth year]  Transition of Care [facility]) CM/SW Contact:    Terre Ferri, RN Phone Number: 12/31/2023, 12:42 PM  Clinical Narrative:                   Expected Discharge Plan: Home w Home Health Services Barriers to Discharge: Continued Medical Work up   Patient Goals and CMS Choice Patient states their goals for this hospitalization and ongoing recovery are:: to return to home CMS Medicare.gov Compare Post Acute Care list provided to:: Patient Choice offered to / list presented to : Patient      Expected Discharge Plan and Services   Discharge Planning Services: CM Consult Post Acute Care Choice: Home Health Living arrangements for the past 2 months: Single Family Home                 DME Arranged: N/A DME Agency: NA       HH Arranged: PT, OT HH Agency: [facility] Date HH Agency Contacted: 12/31/23 Time HH Agency Contacted: 1241 Representative spoke with at [facility] Agency: Loetta Ringer , need orders  Prior Living Arrangements/Services Living arrangements for the past 2 months: Single Family Home Lives with:: Significant Other Patient language and need for interpreter reviewed:: Yes Do you feel safe going back to the place where you live?: Yes      Need for Family Participation in Patient Care: Yes (Comment) Care giver support system in  place?: Yes (comment) Current home services: DME Criminal Activity/Legal Involvement Pertinent to Current Situation/Hospitalization: No - Comment as needed  Activities of Daily Living   ADL Screening (condition at time of admission) Independently performs ADLs?: Yes (appropriate for developmental age) Is the patient deaf or have difficulty hearing?: No Does the patient have difficulty seeing, even when wearing glasses/contacts?: Yes Does the patient have difficulty concentrating, remembering, or making decisions?: No  Permission Sought/Granted   Permission granted to share information with : Yes, Verbal Permission Granted     Permission granted to share info w AGENCY: [facility]        Emotional Assessment Appearance:: Appears stated age Attitude/Demeanor/Rapport: Engaged Affect (typically observed): Appropriate Orientation: : Oriented to Self, Oriented to Place, Oriented to  Time, Oriented to Situation Alcohol / Substance Use: Not Applicable Psych Involvement: No (comment)  Admission diagnosis:  Peritonsillar abscess [J36] Sepsis, due to unspecified organism, unspecified whether acute organ dysfunction present [facility]) [A41.9] Patient Active Problem List   Diagnosis Date Noted   Peritonsillar abscess 12/30/2023   Sepsis (HCC) 12/30/2023   Cervical spinal stenosis 12/03/2023   Preop examination 11/06/2023   Cervical spondylosis with radiculopathy 04/24/2023   Hot flashes 04/24/2023   DDD (degenerative disc disease), lumbar 02/07/2023   Hospital discharge follow-up 02/07/2023   Syncope 01/27/2023   Central cord syndrome (HCC) 01/27/2023   Allergic conjunctivitis of both eyes 01/02/2023   Meralgia paresthetica  of left side 01/01/2023   Type 2 diabetes mellitus with other specified complication (HCC) 11/14/2022   Plantar wart of both feet 11/13/2022   Simple chronic bronchitis (HCC) 11/13/2022   Acute sinusitis 09/13/2022   Sciatica 04/30/2022   Primary osteoarthritis of  hands, bilateral 03/06/2022   Gastroesophageal reflux disease without esophagitis 03/06/2022   Restless legs syndrome (RLS) 07/18/2021   Intertrigo 05/30/2021   Early satiety 04/10/2021   Encounter for general adult medical examination with abnormal findings 12/15/2020   Mixed hyperlipidemia 12/15/2020   Vitamin D  deficiency 09/15/2020   Osteoarthritis of right foot 07/05/2020   Right hand weakness 06/28/2020   History of fusion of cervical spine 06/28/2020   Arthritis 06/01/2020   Diabetic neuropathy (HCC) 06/01/2020   Hearing problem of right ear 05/02/2020   Obesity (BMI 30-39.9) 05/02/2020   Tobacco abuse 05/02/2020   Prediabetes 05/02/2020   GAD (generalized anxiety disorder) 05/07/2019   Hypertension 04/23/2019   HNP (herniated nucleus pulposus), lumbar 07/03/2013   Chronic idiopathic constipation 10/31/2012   PCP:  Meldon Sport, MD Pharmacy:   [facility] - [place], [place] - [address] [place] [address] Phone: [phone] Fax: [phone]  [facility] [address] [place] - [address] [place] [address] [place] [address] [place] [address] Phone: [phone] Fax: [phone]     Social Drivers of Health (SDOH) Social History: SDOH Screenings   Food Insecurity: No Food Insecurity (12/30/2023)  Housing: Unknown (12/30/2023)  Transportation Needs: No Transportation Needs (12/30/2023)  Utilities: Not At Risk (12/30/2023)  Alcohol Screen: Low Risk  (12/26/2020)  Depression (PHQ2-9): Low Risk  (12/09/2023)  Financial Resource Strain: Medium Risk (12/26/2020)  Physical Activity: Inactive (12/26/2020)  Social Connections: Socially Isolated (12/26/2020)  Stress: Stress Concern Present (12/26/2020)  Tobacco Use: High Risk (12/30/2023)   SDOH Interventions: Food Insecurity Interventions: Intervention Not Indicated Housing Interventions: Intervention Not Indicated Utilities Interventions: Intervention Not Indicated   Readmission Risk  Interventions     No data to display

## 2023-12-31 NOTE — Plan of Care (Signed)
   Problem: Education: Goal: Knowledge of General Education information will improve Description Including pain rating scale, medication(s)/side effects and non-pharmacologic comfort measures Outcome: Progressing   Problem: Health Behavior/Discharge Planning: Goal: Ability to manage health-related needs will improve Outcome: Progressing

## 2023-12-31 NOTE — Progress Notes (Signed)
 ENT cart placed outside of patient's room.

## 2023-12-31 NOTE — Plan of Care (Signed)

## 2024-01-01 DIAGNOSIS — J36 Peritonsillar abscess: Secondary | ICD-10-CM | POA: Diagnosis not present

## 2024-01-01 LAB — CBC
HCT: 37.1 % (ref 36.0–46.0)
Hemoglobin: 12.1 g/dL (ref 12.0–15.0)
MCH: 29.1 pg (ref 26.0–34.0)
MCHC: 32.6 g/dL (ref 30.0–36.0)
MCV: 89.2 fL (ref 80.0–100.0)
Platelets: 350 10*3/uL (ref 150–400)
RBC: 4.16 MIL/uL (ref 3.87–5.11)
RDW: 13.5 % (ref 11.5–15.5)
WBC: 18.8 10*3/uL — ABNORMAL HIGH (ref 4.0–10.5)
nRBC: 0 % (ref 0.0–0.2)

## 2024-01-01 LAB — C-REACTIVE PROTEIN: CRP: 3.4 mg/dL — ABNORMAL HIGH (ref <1.0)

## 2024-01-01 LAB — GLUCOSE, CAPILLARY
Glucose-Capillary: 130 mg/dL — ABNORMAL HIGH (ref 70–99)
Glucose-Capillary: 143 mg/dL — ABNORMAL HIGH (ref 70–99)
Glucose-Capillary: 145 mg/dL — ABNORMAL HIGH (ref 70–99)
Glucose-Capillary: 147 mg/dL — ABNORMAL HIGH (ref 70–99)
Glucose-Capillary: 165 mg/dL — ABNORMAL HIGH (ref 70–99)

## 2024-01-01 LAB — COMPREHENSIVE METABOLIC PANEL WITH GFR
ALT: 22 U/L (ref 0–44)
AST: 20 U/L (ref 15–41)
Albumin: 2.9 g/dL — ABNORMAL LOW (ref 3.5–5.0)
Alkaline Phosphatase: 82 U/L (ref 38–126)
Anion gap: 13 (ref 5–15)
BUN: 20 mg/dL (ref 6–20)
CO2: 21 mmol/L — ABNORMAL LOW (ref 22–32)
Calcium: 9.3 mg/dL (ref 8.9–10.3)
Chloride: 105 mmol/L (ref 98–111)
Creatinine, Ser: 0.88 mg/dL (ref 0.44–1.00)
GFR, Estimated: >60 mL/min (ref >60)
Glucose, Bld: 156 mg/dL — ABNORMAL HIGH (ref 70–99)
Potassium: 4 mmol/L (ref 3.5–5.1)
Sodium: 139 mmol/L (ref 135–145)
Total Bilirubin: 0.5 mg/dL (ref 0.0–1.2)
Total Protein: 7.5 g/dL (ref 6.5–8.1)

## 2024-01-01 LAB — PROTIME-INR
INR: 1.1 (ref 0.8–1.2)
Prothrombin Time: 14.2 s (ref 11.4–15.2)

## 2024-01-01 LAB — MAGNESIUM: Magnesium: 1.8 mg/dL (ref 1.7–2.4)

## 2024-01-01 MED ORDER — OXYCODONE HCL 5 MG PO TABS
5.0000 mg | ORAL_TABLET | Freq: Four times a day (QID) | ORAL | Status: DC | PRN
  Administered 2024-01-01 – 2024-01-02 (×3): 5 mg via ORAL
  Filled 2024-01-01 (×3): qty 1

## 2024-01-01 MED ORDER — INSULIN ASPART 100 UNIT/ML IJ SOLN
0.0000 [IU] | Freq: Four times a day (QID) | INTRAMUSCULAR | Status: DC
  Administered 2024-01-01 – 2024-01-02 (×3): 2 [IU] via SUBCUTANEOUS

## 2024-01-01 MED ORDER — LORAZEPAM 2 MG/ML IJ SOLN
0.5000 mg | Freq: Two times a day (BID) | INTRAMUSCULAR | Status: DC | PRN
  Administered 2024-01-01: 0.5 mg via INTRAVENOUS
  Filled 2024-01-01: qty 1

## 2024-01-01 MED ORDER — MORPHINE SULFATE (PF) 2 MG/ML IV SOLN
2.0000 mg | INTRAVENOUS | Status: DC | PRN
  Administered 2024-01-01 – 2024-01-02 (×2): 2 mg via INTRAVENOUS
  Filled 2024-01-01 (×2): qty 1

## 2024-01-01 NOTE — Progress Notes (Signed)
 ENT PROGRESS NOTE   Subjective: Patient seen and examined at bedside. No issues overnight.  Patient notes continued improvement in symptoms, and feels ready to go home.  She was able to eat pot roast yesterday without difficulty.  Ports lack of appetite today.  Objective: Vital signs in last 24 hours: Temp:  [97.9 F (36.6 C)-98.2 F (36.8 C)] 97.9 F (36.6 C) (06/11 1546) Pulse Rate:  [62-83] 83 (06/11 1546) Resp:  [17-22] 17 (06/11 1546) BP: (111-138)/(52-78) 111/78 (06/11 1546) SpO2:  [93 %-97 %] 97 % (06/11 1546)  PHYSICAL EXAM:  CONSTITUTIONAL: well developed, nourished, no distress and alert and oriented x 3 PULMONARY/CHEST WALL: effort normal and no stridor, no stertor, no dysphonia Mouth/Throat:  Mouth: uvula midline, left palatal edema improved with exudate on left tonsil.  Mucous membranes: normal EYES: conjunctiva normal, EOM normal and PERRL NECK: supple, trachea normal and no thyromegaly or cervical LAD  @LABLAST2 (wbc:2,hgb:2,hct:2,plt:2) Recent Labs    12/31/23 0558 01/01/24 0633  NA 136 139  K 3.8 4.0  CL 102 105  CO2 23 21*  GLUCOSE 168* 156*  BUN 16 20  CREATININE 0.74 0.88  CALCIUM  9.1 9.3    Medications: I have reviewed the patient's current medications.  New Imaging: None   Assessment/Plan: Brenda Taylor is a 53 y/o F with tonsillitis and left peritonsillar abscess - Patient notes significant improvement in symptoms since admission, with improved tolerance of secretions and marked reduction in throat pain. - Exam is improved, with uvula now midline and reduction in left palatal edema. Based on continued clinical improvement, incision and drainage was not pursued. - When deemed stable for discharge by primary team, recommend discharge with clindamycin , 300 mg 3 times daily for 10 days -Canceled patient that if she were to have another peritonsillar abscess in the future, tonsillectomy would be indicated - Follow-up with ENT or PCP outpatient to  ensure complete resolution.   LOS: 2 days   Thank you for allowing me to participate in the care of this patient. Please do not hesitate to contact me with any questions or concerns.   Daleen Dubs, DO Otolaryngology [facility] ENT Cell: [phone]   Daleen Dubs, DO [facility] ENT 01/01/2024, 5:45 PM

## 2024-01-01 NOTE — Progress Notes (Addendum)
  Progress Note   Patient: Brenda Taylor YQM:[id number] DOB: 1970/12/21 DOA: 12/30/2023     2 DOS: the patient was seen and examined on 01/01/2024  Assessment and Plan: * Peritonsillar abscess - Dr. 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 WBC downtrending 20 -->18. Continue with IV antibx. Pt reports her symptoms are slowly improving. Pt would like to avoid I&D if possible.   Physical Exam: Vitals:   12/31/23 1547 12/31/23 2232 01/01/24 0350 01/01/24 0822  BP: 115/65 126/67 138/76 (!) 134/52  Pulse: 96 80 74 62  Resp: 17 (!) 22 18 17   Temp: 97.7 F (36.5 C) 98.2 F (36.8 C) 98 F (36.7 C) 97.9 F (36.6 C)  TempSrc: Oral Oral Oral Oral  SpO2: 97% 95% 96% 93%  Weight:      Height:       Physical Exam HENT:     Head: Normocephalic.     Mouth/Throat:     Mouth: Mucous membranes are moist.  Cardiovascular:     Rate and Rhythm: Normal rate.  Pulmonary:     Effort: Pulmonary effort is normal.  Abdominal:     Palpations: Abdomen is soft.  Musculoskeletal:        General: Normal range of motion.  Skin:    General: Skin is warm.  Neurological:     Mental Status: She is alert and oriented to person, place, and time.  Psychiatric:        Mood and Affect: Mood normal.      Disposition: Status is: Inpatient Remains inpatient appropriate because: IV antibx  Planned Discharge Destination: Home    Time spent: 35 minutes  Author: Keishon Chavarin , MD 01/01/2024 1:47 PM  For on call review [email or web address].

## 2024-01-01 NOTE — Plan of Care (Signed)
  Problem: Coping: Goal: Level of anxiety will decrease Outcome: Progressing   Problem: Pain Managment: Goal: General experience of comfort will improve and/or be controlled Outcome: Progressing   Problem: Safety: Goal: Ability to remain free from injury will improve Outcome: Progressing   Problem: Coping: Goal: Ability to adjust to condition or change in health will improve Outcome: Progressing   Problem: Fluid Volume: Goal: Ability to maintain a balanced intake and output will improve Outcome: Progressing   Problem: Nutritional: Goal: Maintenance of adequate nutrition will improve Outcome: Progressing   Problem: Skin Integrity: Goal: Risk for impaired skin integrity will decrease Outcome: Progressing   Problem: Tissue Perfusion: Goal: Adequacy of tissue perfusion will improve Outcome: Progressing

## 2024-01-01 NOTE — Progress Notes (Signed)
   01/01/24 1700  Spiritual Encounters  Type of Visit Initial  Care provided to: Patient  Reason for visit Routine spiritual support  OnCall Visit No  Spiritual Framework  Presenting Themes Meaning/purpose/sources of inspiration;Values and beliefs;Goals in life/care;Significant life change;Coping tools;Rituals and practive;Community and relationships  Community/Connection Family;Faith community  Patient Stress Factors Health changes;Loss of control;Major life changes  Interventions  Spiritual Care Interventions Made Established relationship of care and support;Compassionate presence;Reflective listening;Normalization of emotions;Narrative/life review;Prayer  Intervention Outcomes  Outcomes Connection to spiritual care;Awareness around self/spiritual resourses;Connection to values and goals of care;Awareness of support;Reduced anxiety    Chaplain responded to consult request for prayer. Patient concerned about health changes on her neck and left foot. She said that she is an independent and active person who has been working since she was 53 years old. She has three grand kids that she takes care of them regularly. She declared that it is hard for her to stuck in bed with a lot of pain and doing nothing. She wants to go home as soon as possible.  She shared that how she experienced God's presence in her husband's life. She lost her husband 10 years ago. She still in deep sorrow.   Chaplain listened to her attentively, provided compassionate presence, reduced her anxiety and fear, shared some part of the life story and prayed upon pt's request.   M.Kubra Welby Hale Resident [phone]

## 2024-01-02 DIAGNOSIS — J36 Peritonsillar abscess: Secondary | ICD-10-CM | POA: Diagnosis not present

## 2024-01-02 LAB — GLUCOSE, CAPILLARY
Glucose-Capillary: 138 mg/dL — ABNORMAL HIGH (ref 70–99)
Glucose-Capillary: 157 mg/dL — ABNORMAL HIGH (ref 70–99)

## 2024-01-02 LAB — CBC
HCT: 35.5 % — ABNORMAL LOW (ref 36.0–46.0)
Hemoglobin: 11.6 g/dL — ABNORMAL LOW (ref 12.0–15.0)
MCH: 29 pg (ref 26.0–34.0)
MCHC: 32.7 g/dL (ref 30.0–36.0)
MCV: 88.8 fL (ref 80.0–100.0)
Platelets: 305 10*3/uL (ref 150–400)
RBC: 4 MIL/uL (ref 3.87–5.11)
RDW: 13.4 % (ref 11.5–15.5)
WBC: 14.3 10*3/uL — ABNORMAL HIGH (ref 4.0–10.5)
nRBC: 0 % (ref 0.0–0.2)

## 2024-01-02 LAB — COMPREHENSIVE METABOLIC PANEL WITH GFR
ALT: 22 U/L (ref 0–44)
AST: 22 U/L (ref 15–41)
Albumin: 2.9 g/dL — ABNORMAL LOW (ref 3.5–5.0)
Alkaline Phosphatase: 73 U/L (ref 38–126)
Anion gap: 10 (ref 5–15)
BUN: 26 mg/dL — ABNORMAL HIGH (ref 6–20)
CO2: 20 mmol/L — ABNORMAL LOW (ref 22–32)
Calcium: 9 mg/dL (ref 8.9–10.3)
Chloride: 106 mmol/L (ref 98–111)
Creatinine, Ser: 0.77 mg/dL (ref 0.44–1.00)
GFR, Estimated: >60 mL/min (ref >60)
Glucose, Bld: 142 mg/dL — ABNORMAL HIGH (ref 70–99)
Potassium: 4.1 mmol/L (ref 3.5–5.1)
Sodium: 136 mmol/L (ref 135–145)
Total Bilirubin: 0.3 mg/dL (ref 0.0–1.2)
Total Protein: 6.9 g/dL (ref 6.5–8.1)

## 2024-01-02 LAB — MAGNESIUM: Magnesium: 1.9 mg/dL (ref 1.7–2.4)

## 2024-01-02 LAB — C-REACTIVE PROTEIN: CRP: 0.9 mg/dL (ref <1.0)

## 2024-01-02 MED ORDER — CLINDAMYCIN HCL 300 MG PO CAPS: 300.0000 mg | ORAL_CAPSULE | Freq: Three times a day (TID) | ORAL | 0 refills | Status: AC

## 2024-01-02 NOTE — Discharge Summary (Addendum)
 Physician Discharge Summary   Patient: Brenda Taylor MRN: [id number] DOB: 1971-06-08  Admit date:     12/30/2023  Discharge date: 01/02/24  Discharge Physician: Mickle Albe    PCP: Meldon Sport, MD     Discharge Diagnoses: Principal Problem:   Peritonsillar abscess Active Problems:   Sepsis (HCC)   Hypertension   Diabetic neuropathy (HCC)  Resolved Problems:   * No resolved hospital problems. *  Hospital Course:  53 yo F admitted for peritonsillar abscess. Dr. Westley Hammers from ENT was following along while the pt was admitted. Pt received IV cefepime  2 g q8hr, IV flagyl  500 mg q12, Oxycodone  5 mg PO q6 hr PRN, IV morphine  2 mg q3 hr PRN, IV dexamethasone  8mg  TID. Diet was advanced slowly. Group A strep was positive. Blood cx's were negative x 3 days. Diet was advanced to carb modified diet and pt tolerated well. I&D was not completed as the pt wished to defer this procedure as she was improving clinically. WBC downtrended from 20 -->14. Pt was requesting discharge home. She will go home with 10 days of clindamycin  300 mg tid (per ENT recommendations). Amb referral made to Dr. Niel Barrs for followup. Pt understands that if there are any further issues regarding the tonsils, she is likely to require surgery in the future.  Please note: Peritonsillar abscess, unrelated to spinal fusion surgery.    DISCHARGE MEDICATION: Allergies as of 01/02/2024       Reactions   Aspirin Other (See Comments)   Cannot take due to ulcers   Nsaids Other (See Comments)   Cannot take due to ulcers    Amoxicillin Hives        Medication List     STOP taking these medications    benzonatate  200 MG capsule Commonly known as: TESSALON    lidocaine  5 % Commonly known as: Lidoderm    predniSONE  10 MG (21) Tbpk tablet Commonly known as: STERAPRED UNI-PAK 21 TAB       TAKE these medications    ALPRAZolam  0.5 MG tablet Commonly known as: XANAX  TAKE ONE TABLET BY MOUTH AT BEDTIME AS NEEDED  FOR ANXIETY. What changed: See the new instructions.   BLOOD GLUCOSE TEST STRIPS Strp 1 each by In Vitro route in the morning, at noon, and at bedtime. May substitute to any manufacturer covered by patient's insurance.   clindamycin  300 MG capsule Commonly known as: Cleocin  Take 1 capsule (300 mg total) by mouth 3 (three) times daily for 10 days.   hydrochlorothiazide  25 MG tablet Commonly known as: HYDRODIURIL  TAKE 1 TABLET BY MOUTH ONCE A DAY.   metFORMIN  500 MG 24 hr tablet Commonly known as: GLUCOPHAGE -XR TAKE ONE TABLET (=500MG  TOTAL) EVERY DAY WITH BREAKFAST   methocarbamol  500 MG tablet Commonly known as: ROBAXIN  Take 1 tablet (500 mg total) by mouth every 6 (six) hours as needed for muscle spasms.   oxyCODONE  5 MG immediate release tablet Commonly known as: Oxy IR/ROXICODONE  Take 5-10 mg by mouth every 6 (six) hours as needed (pain).   pantoprazole  40 MG tablet Commonly known as: PROTONIX  TAKE (1) TABLET BY MOUTH TWICE DAILY BEFORE MEALS. What changed: See the new instructions.   pregabalin  75 MG capsule Commonly known as: LYRICA  Take 1 capsule by mouth 2 (two) times daily.   rosuvastatin  5 MG tablet Commonly known as: CRESTOR  TAKE ONE TABLET BY MOUTH ONCE DAILY.        Discharge Exam: Filed Weights   12/30/23 1152  Weight: 124.3 kg  Physical Exam HENT:     Head: Normocephalic.     Mouth/Throat:     Mouth: Mucous membranes are moist.   Cardiovascular:     Rate and Rhythm: Normal rate.  Pulmonary:     Effort: Pulmonary effort is normal.  Abdominal:     Palpations: Abdomen is soft.   Musculoskeletal:        General: Normal range of motion.   Skin:    General: Skin is warm.   Neurological:     Mental Status: She is alert and oriented to person, place, and time.   Psychiatric:        Mood and Affect: Mood normal.      Condition at discharge: good  The results of significant diagnostics from this hospitalization (including imaging,  microbiology, ancillary and laboratory) are listed below for reference.   Imaging Studies: CT Soft Tissue Neck W Contrast Result Date: 12/30/2023 CLINICAL DATA:  Epiglottitis or tonsillitis suspected. Trismus, strep+, ?pta. EXAM: CT NECK WITH CONTRAST TECHNIQUE: Multidetector CT imaging of the neck was performed using the standard protocol following the bolus administration of intravenous contrast. RADIATION DOSE REDUCTION: This exam was performed according to the departmental dose-optimization program which includes automated exposure control, adjustment of the mA and/or kV according to patient size and/or use of iterative reconstruction technique. CONTRAST:  75mL OMNIPAQUE  IOHEXOL  300 MG/ML  SOLN COMPARISON:  None Available. FINDINGS: Pharynx and larynx: Asymmetric enlargement of the left palatine tonsil with regional low-density edema extending inferiorly and posteriorly in the oropharynx. 1.9 x 1.6 x 2.1 cm fluid collection along the anterolateral aspect of the tonsil. Symmetric prominence of the posterior nasopharyngeal soft tissues. Moderate narrowing of the upper oropharyngeal airway. No significant epiglottic swelling. No retropharyngeal fluid collection. Salivary glands: Asymmetric enlargement of the left submandibular gland with mild surrounding stranding. Unremarkable parotid glands. Thyroid: Unremarkable. Lymph nodes: Mildly enlarged level II lymph nodes measuring up to 1.3 cm in short axis bilaterally, likely reactive. Vascular: Mild-to-moderate atherosclerosis about the right greater than left carotid bifurcations. Limited intracranial: Unremarkable. Visualized orbits: Unremarkable. Mastoids and visualized paranasal sinuses: Mild scattered mucosal thickening in left greater than right ethmoid air cells. Bilateral mastoid effusions. Partial left middle ear opacification. Skeleton: C3-C7 ACDF.  Dental caries.  Absent maxillary dentition. Upper chest: Clear lung apices. Other: None. IMPRESSION: 1.  Acute tonsillitis with a 2 cm left peritonsillar abscess. 2. Suspected acute left submandibular sialoadenitis. 3. Bilateral mastoid and left middle ear effusions. Electronically Signed   By: Aundra Lee M.D.   On: 12/30/2023 15:00   DG Cervical Spine 2 or 3 views Result Date: 12/03/2023 CLINICAL DATA:  ACDF. EXAM: CERVICAL SPINE - 2-3 VIEW COMPARISON:  Cervical spine radiograph dated 04/29/2019. FINDINGS: 6 intraoperative fluoroscopic spot images provided. The total fluoroscopic time is 30 seconds with a cumulative air Karma 15.57 mGy. Status post ACDF. IMPRESSION: Intraoperative fluoroscopic spot images. Electronically Signed   By: Angus Bark M.D.   On: 12/03/2023 17:49   DG C-Arm 1-60 Min-No Report Result Date: 12/03/2023 Fluoroscopy was utilized by the requesting physician.  No radiographic interpretation.   DG C-Arm 1-60 Min-No Report Result Date: 12/03/2023 Fluoroscopy was utilized by the requesting physician.  No radiographic interpretation.   DG C-Arm 1-60 Min-No Report Result Date: 12/03/2023 Fluoroscopy was utilized by the requesting physician.  No radiographic interpretation.   DG C-Arm 1-60 Min-No Report Result Date: 12/03/2023 Fluoroscopy was utilized by the requesting physician.  No radiographic interpretation.    Microbiology: Results for orders placed  or performed during the hospital encounter of 12/30/23  Group A Strep by PCR     Status: Abnormal   Collection Time: 12/30/23 11:53 AM   Specimen: Throat; Sterile Swab  Result Value Ref Range Status   Group A Strep by PCR DETECTED (A) NOT DETECTED Final    Comment: Performed at [facility], [address] [place], [place] [address]  Resp panel by RT-PCR (RSV, Flu A&B, Covid) Anterior Nasal Swab     Status: None   Collection Time: 12/30/23 11:54 AM   Specimen: Anterior Nasal Swab  Result Value Ref Range Status   SARS Coronavirus 2 by RT PCR NEGATIVE NEGATIVE Final    Comment: (NOTE) SARS-CoV-2 target nucleic acids  are NOT DETECTED.  The SARS-CoV-2 RNA is generally detectable in upper respiratory specimens during the acute phase of infection. The lowest concentration of SARS-CoV-2 viral copies this assay can detect is 138 copies/mL. A negative result does not preclude SARS-Cov-2 infection and should not be used as the sole basis for treatment or other patient management decisions. A negative result may occur with  improper specimen collection/handling, submission of specimen other than nasopharyngeal swab, presence of viral mutation(s) within the areas targeted by this assay, and inadequate number of viral copies(<138 copies/mL). A negative result must be combined with clinical observations, patient history, and epidemiological information. The expected result is Negative.  Fact Sheet for Patients:  BloggerCourse.com  Fact Sheet for Healthcare Providers:  SeriousBroker.it  This test is no t yet approved or cleared by the [place]  FDA and  has been authorized for detection and/or diagnosis of SARS-CoV-2 by FDA under an Emergency Use Authorization (EUA). This EUA will remain  in effect (meaning this test can be used) for the duration of the COVID-19 declaration under Section 564(b)(1) of the Act, 21 U.S.C.section 360bbb-3(b)(1), unless the authorization is terminated  or revoked sooner.       Influenza A by PCR NEGATIVE NEGATIVE Final   Influenza B by PCR NEGATIVE NEGATIVE Final    Comment: (NOTE) The Xpert Xpress SARS-CoV-2/FLU/RSV plus assay is intended as an aid in the diagnosis of influenza from Nasopharyngeal swab specimens and should not be used as a sole basis for treatment. Nasal washings and aspirates are unacceptable for Xpert Xpress SARS-CoV-2/FLU/RSV testing.  Fact Sheet for Patients: BloggerCourse.com  Fact Sheet for Healthcare Providers: SeriousBroker.it  This test is  not yet approved or cleared by the [place]  FDA and has been authorized for detection and/or diagnosis of SARS-CoV-2 by FDA under an Emergency Use Authorization (EUA). This EUA will remain in effect (meaning this test can be used) for the duration of the COVID-19 declaration under Section 564(b)(1) of the Act, 21 U.S.C. section 360bbb-3(b)(1), unless the authorization is terminated or revoked.     Resp Syncytial Virus by PCR NEGATIVE NEGATIVE Final    Comment: (NOTE) Fact Sheet for Patients: BloggerCourse.com  Fact Sheet for Healthcare Providers: SeriousBroker.it  This test is not yet approved or cleared by the [place]  FDA and has been authorized for detection and/or diagnosis of SARS-CoV-2 by FDA under an Emergency Use Authorization (EUA). This EUA will remain in effect (meaning this test can be used) for the duration of the COVID-19 declaration under Section 564(b)(1) of the Act, 21 U.S.C. section 360bbb-3(b)(1), unless the authorization is terminated or revoked.  Performed at [facility], [address], [place], [place] [address]   Blood culture (routine x 2)     Status: None (Preliminary result)  Collection Time: 12/30/23  3:04 PM   Specimen: BLOOD  Result Value Ref Range Status   Specimen Description BLOOD BLOOD LEFT ARM  Final   Special Requests   Final    Blood Culture results may not be optimal due to an inadequate volume of blood received in culture bottles BOTTLES DRAWN AEROBIC AND ANAEROBIC   Culture   Final    NO GROWTH 3 DAYS Performed at [facility], [address] [place], [place] [address]    Report Status PENDING  Incomplete  Blood culture (routine x 2)     Status: None (Preliminary result)   Collection Time: 12/30/23  3:04 PM   Specimen: BLOOD  Result Value Ref Range Status   Specimen Description BLOOD LFOA  Final   Special Requests   Final    BOTTLES DRAWN AEROBIC AND ANAEROBIC Blood Culture  results may not be optimal due to an inadequate volume of blood received in culture bottles   Culture   Final    NO GROWTH 3 DAYS Performed at [facility], [address] [place], [place] [address]    Report Status PENDING  Incomplete    Labs: CBC: Recent Labs  Lab 12/30/23 1312 12/31/23 0558 01/01/24 0633 01/02/24 0543  WBC 20.5* 20.3* 18.8* 14.3*  NEUTROABS 15.0*  --   --   --   HGB 13.5 12.9 12.1 11.6*  HCT 40.8 39.2 37.1 35.5*  MCV 90.3 88.5 89.2 88.8  PLT 345 367 350 305   Basic Metabolic Panel: Recent Labs  Lab 12/30/23 1312 12/31/23 0558 01/01/24 0633 01/02/24 0543  NA 135 136 139 136  K 3.4* 3.8 4.0 4.1  CL 96* 102 105 106  CO2 26 23 21* 20*  GLUCOSE 147* 168* 156* 142*  BUN 25* 16 20 26*  CREATININE 0.82 0.74 0.88 0.77  CALCIUM  9.5 9.1 9.3 9.0  MG  --   --  1.8 1.9   Liver Function Tests: Recent Labs  Lab 01/01/24 0633 01/02/24 0543  AST 20 22  ALT 22 22  ALKPHOS 82 73  BILITOT 0.5 0.3  PROT 7.5 6.9  ALBUMIN  2.9* 2.9*   CBG: Recent Labs  Lab 01/01/24 0820 01/01/24 1149 01/01/24 1814 01/02/24 0006 01/02/24 0629  GLUCAP 143* 147* 130* 157* 138*    Discharge time spent: greater than 30 minutes.  Signed: Nai Borromeo , MD [facility] 01/02/2024

## 2024-01-02 NOTE — Plan of Care (Signed)
   Problem: Education: Goal: Knowledge of General Education information will improve Description: Including pain rating scale, medication(s)/side effects and non-pharmacologic comfort measures Outcome: Progressing   Problem: Health Behavior/Discharge Planning: Goal: Ability to manage health-related needs will improve Outcome: Progressing   Problem: Clinical Measurements: Goal: Will remain free from infection Outcome: Progressing   Problem: Nutrition: Goal: Adequate nutrition will be maintained Outcome: Progressing   Problem: Coping: Goal: Level of anxiety will decrease Outcome: Progressing   Problem: Safety: Goal: Ability to remain free from injury will improve Outcome: Progressing

## 2024-01-02 NOTE — TOC Transition Note (Signed)
 Transition of Care [facility]) - Discharge Note   Patient Details  Name: ROSELIN WIEMANN MRN: [id number] Date of Birth: 1971-02-09  Transition of Care [facility] CM/SW Contact:  Jeani Mill, RN Phone Number: 01/02/2024, 12:03 PM   Clinical Narrative:    Patient stable for discharge.  Patient is active with [facility]. Kelly with [facility] notified of discharge.  No other TOC needs at this time.   Final next level of care: Home w Home Health Services Barriers to Discharge: Barriers Resolved   Patient Goals and CMS Choice Patient states their goals for this hospitalization and ongoing recovery are:: to return to home CMS Medicare.gov Compare Post Acute Care list provided to:: Patient Choice offered to / list presented to : Patient      Discharge Placement  home                     Discharge Plan and Services Additional resources added to the After Visit Summary for     Discharge Planning Services: CM Consult Post Acute Care Choice: Home Health          DME Arranged: N/A DME Agency: NA       HH Arranged: PT, OT HH Agency: [facility] Date HH Agency Contacted: 12/31/23 Time HH Agency Contacted: 1241 Representative spoke with at [facility] Agency: Loetta Ringer , need orders  Social Drivers of Health (SDOH) Interventions SDOH Screenings   Food Insecurity: No Food Insecurity (12/30/2023)  Housing: Low Risk  (01/02/2024)  Transportation Needs: No Transportation Needs (12/30/2023)  Utilities: Not At Risk (12/30/2023)  Alcohol Screen: Low Risk  (12/26/2020)  Depression (PHQ2-9): Low Risk  (12/09/2023)  Financial Resource Strain: Medium Risk (12/26/2020)  Physical Activity: Inactive (12/26/2020)  Social Connections: Socially Isolated (12/26/2020)  Stress: Stress Concern Present (12/26/2020)  Tobacco Use: High Risk (12/30/2023)     Readmission Risk Interventions    01/02/2024   12:02 PM  Readmission Risk Prevention Plan  Transportation Screening Complete  PCP or Specialist Appt within  5-7 Days Complete  Home Care Screening Complete  Medication Review (RN CM) Complete

## 2024-01-02 NOTE — Progress Notes (Incomplete)
 Brenda Taylor to be D/C'd  per MD order.  Discussed with the patient and all questions fully answered.  VSS, Skin clean, dry and intact without evidence of skin break down, no evidence of skin tears noted.  IV catheter discontinued intact. Site without signs and symptoms of complications. Dressing and pressure applied.  An After Visit Summary was printed and given to the patient.  D/c education completed with patient/family including follow up instructions, medication list, d/c activities limitations if indicated, with other d/c instructions as indicated by MD - patient able to verbalize understanding, all questions fully answered.   Patient instructed to return to ED, call 911, or call MD for any changes in condition.   Patient to be escorted via WC, and D/C home via private auto.

## 2024-01-03 ENCOUNTER — Telehealth: Payer: Self-pay

## 2024-01-03 NOTE — Telephone Encounter (Unsigned)
 Copied from CRM [id number]. Topic: Clinical - Medical Advice >> Jan 03, 2024 11:28 AM Tiffini S wrote: Reason for CRM: [name] with [facility] [phone] called stating that the patient would like to rezoom physical therapy on 01/09/24

## 2024-01-03 NOTE — Transitions of Care (Post Inpatient/ED Visit) (Signed)
   01/03/2024  Name: Brenda Taylor MRN: [id number] DOB: 11-21-1970  Today's TOC FU Call Status: Today's TOC FU Call Status:: Unsuccessful Call (1st Attempt) Unsuccessful Call (1st Attempt) Date: 01/03/24  Attempted to reach the patient regarding the most recent Inpatient/ED visit.  Follow Up Plan: Additional outreach attempts will be made to reach the patient to complete the Transitions of Care (Post Inpatient/ED visit) call.   Orpha Blade, RN, BSN, CEN [employer]- Transition of Care Team.  [facility] [phone]

## 2024-01-04 LAB — CULTURE, BLOOD (ROUTINE X 2)
Culture: NO GROWTH
Culture: NO GROWTH

## 2024-01-06 ENCOUNTER — Telehealth: Payer: Self-pay

## 2024-01-06 NOTE — Telephone Encounter (Signed)
 Spoke to I[place] to approve orders.

## 2024-01-06 NOTE — Transitions of Care (Post Inpatient/ED Visit) (Signed)
 01/06/2024  Name: Brenda Taylor MRN: [id number] DOB: 11/11/1970  Today's TOC FU Call Status: Today's TOC FU Call Status:: Successful TOC FU Call Completed TOC FU Call Complete Date: 01/06/24 Patient's Name and Date of Birth confirmed.  Transition Care Management Follow-up Telephone Call How have you been since you were released from the hospital?: Better (reports that her throat is better) Any questions or concerns?: No  Items Reviewed: Did you receive and understand the discharge instructions provided?: Yes Medications obtained,verified, and reconciled?: Yes (Medications Reviewed) Any new allergies since your discharge?: No Dietary orders reviewed?: Yes Type of Diet Ordered:: soft Do you have support at home?: Yes People in Home [RPT]: significant other Name of Support/Comfort Primary Source: [place]  Medications Reviewed Today: Medications Reviewed Today     Reviewed by Vanetta Generous, RN (Registered Nurse) on 01/06/24 at 1231  Med List Status: <None>   Medication Order Taking? Sig Documenting Provider Last Dose Status Informant  ALPRAZolam  (XANAX ) 0.5 MG tablet [id number] Yes TAKE ONE TABLET BY MOUTH AT BEDTIME AS NEEDED FOR ANXIETY. Tobi Fortes, MD  Active Self, Pharmacy Records  clindamycin  (CLEOCIN ) 300 MG capsule [id number] Yes Take 1 capsule (300 mg total) by mouth 3 (three) times daily for 10 days. Sira, Zackery, MD  Active   Glucose Blood (BLOOD GLUCOSE TEST STRIPS) STRP [id number] Yes 1 each by In Vitro route in the morning, at noon, and at bedtime. May substitute to any manufacturer covered by patient's insurance. Meldon Sport, MD  Active Self, Pharmacy Records  hydrochlorothiazide  (HYDRODIURIL ) 25 MG tablet [id number] Yes TAKE 1 TABLET BY MOUTH ONCE A DAY. Meldon Sport, MD  Active Self, Pharmacy Records  metFORMIN  (GLUCOPHAGE -XR) 500 MG 24 hr tablet [id number] Yes TAKE ONE TABLET (=500MG  TOTAL) EVERY DAY WITH BREAKFAST Patel, Rutwik K, MD  Active Self, Pharmacy  Records  methocarbamol  (ROBAXIN ) 500 MG tablet [id number] Yes Take 1 tablet (500 mg total) by mouth every 6 (six) hours as needed for muscle spasms. Easter Golden [place], PA-C  Active Self, Pharmacy Records  oxyCODONE  (OXY IR/ROXICODONE ) 5 MG immediate release tablet [id number] Yes Take 5-10 mg by mouth every 6 (six) hours as needed (pain). [provider]  Active Self, Pharmacy Records  pantoprazole  (PROTONIX ) 40 MG tablet [id number] Yes TAKE (1) TABLET BY MOUTH TWICE DAILY BEFORE MEALS. Vinetta Greening, DO  Active Self, Pharmacy Records           Med Note [place], ANGELICA G   Mon Dec 30, 2023  3:27 PM)    pregabalin  (LYRICA ) 75 MG capsule [id number] Yes Take 1 capsule by mouth 2 (two) times daily. [provider]  Active Self, Pharmacy Records  rosuvastatin  (CRESTOR ) 5 MG tablet [id number] Yes TAKE ONE TABLET BY MOUTH ONCE DAILY. Meldon Sport, MD  Active Self, Pharmacy Records            Home Care and Equipment/Supplies: Were Home Health Services Ordered?: No Any new equipment or medical supplies ordered?: No  Functional Questionnaire: Do you need assistance with bathing/showering or dressing?: No Do you need assistance with meal preparation?: No Do you need assistance with eating?: No Do you have difficulty maintaining continence: No Do you need assistance with getting out of bed/getting out of a chair/moving?: No Do you have difficulty managing or taking your medications?: No  Follow up appointments reviewed: PCP Follow-up appointment confirmed?: Yes Date of PCP follow-up appointment?: 01/15/24 Follow-up Provider: Dr. Lydia Sams [facility] Follow-up appointment confirmed?: No Reason Specialist Follow-Up  Not Confirmed: Patient has Specialist Provider Number and will Call for Appointment Do you need transportation to your follow-up appointment?: No Do you understand care options if your condition(s) worsen?: Yes-patient verbalized understanding  SDOH  Interventions Today    Flowsheet Row Most Recent Value  SDOH Interventions   Food Insecurity Interventions Intervention Not Indicated  Housing Interventions Intervention Not Indicated  Transportation Interventions Intervention Not Indicated  Utilities Interventions Intervention Not Indicated   Patient reports that she is doing well.  Reports higher than normal CBG ranges since being on steroids while admitted.  Cbg today of 147 fasting.  Patient states that she continues to eat a soft diet.    Interventions: Reviewed all discharge instructions. Reviewed when to call 911 or get emergency treatment for difficulty breathing or swallowing. Reviewed pending follows up and ensured transportation. Reviewed and encouraged patient to take all her medications as prescribed.  Reviewed and offered 30 day TOC program and patient states that she will call me if she needs me. Reviewed DM diet via phone.  Reviewed fruits that are lower in carbs than another.    Orpha Blade, RN, BSN, CEN [employer]- Transition of Care Team.  [facility] [phone]

## 2024-01-10 ENCOUNTER — Telehealth: Payer: Self-pay

## 2024-01-10 ENCOUNTER — Telehealth: Payer: Self-pay | Admitting: Internal Medicine

## 2024-01-10 NOTE — Telephone Encounter (Signed)
 Spoke to [facility] to approve orders.

## 2024-01-10 NOTE — Telephone Encounter (Unsigned)
 Copied from CRM [id number]. Topic: Clinical - Home Health Verbal Orders >> Jan 10, 2024  9:58 AM Tiffany S wrote: [facility]: [phone] Service Requested: Occupational Therapy Frequency: Moved to the week of 01/19/24 Any new concerns about the patient? No

## 2024-01-13 NOTE — Telephone Encounter (Signed)
Left detailed message providing verbal orders.

## 2024-01-15 ENCOUNTER — Encounter: Payer: Self-pay | Admitting: Internal Medicine

## 2024-01-15 ENCOUNTER — Ambulatory Visit: Admitting: Internal Medicine

## 2024-01-15 VITALS — BP 102/73 | HR 101 | Ht 66.0 in | Wt 277.2 lb

## 2024-01-15 DIAGNOSIS — Z7984 Long term (current) use of oral hypoglycemic drugs: Secondary | ICD-10-CM

## 2024-01-15 DIAGNOSIS — F411 Generalized anxiety disorder: Secondary | ICD-10-CM

## 2024-01-15 DIAGNOSIS — Z09 Encounter for follow-up examination after completed treatment for conditions other than malignant neoplasm: Secondary | ICD-10-CM

## 2024-01-15 DIAGNOSIS — F331 Major depressive disorder, recurrent, moderate: Secondary | ICD-10-CM

## 2024-01-15 DIAGNOSIS — S14129D Central cord syndrome at unspecified level of cervical spinal cord, subsequent encounter: Secondary | ICD-10-CM

## 2024-01-15 DIAGNOSIS — I10 Essential (primary) hypertension: Secondary | ICD-10-CM | POA: Diagnosis not present

## 2024-01-15 DIAGNOSIS — M4722 Other spondylosis with radiculopathy, cervical region: Secondary | ICD-10-CM

## 2024-01-15 DIAGNOSIS — J36 Peritonsillar abscess: Secondary | ICD-10-CM

## 2024-01-15 DIAGNOSIS — E1142 Type 2 diabetes mellitus with diabetic polyneuropathy: Secondary | ICD-10-CM

## 2024-01-15 DIAGNOSIS — F419 Anxiety disorder, unspecified: Secondary | ICD-10-CM

## 2024-01-15 DIAGNOSIS — E1169 Type 2 diabetes mellitus with other specified complication: Secondary | ICD-10-CM | POA: Diagnosis not present

## 2024-01-15 MED ORDER — ALPRAZOLAM 0.5 MG PO TABS: 0.5000 mg | ORAL_TABLET | Freq: Two times a day (BID) | ORAL | 1 refills | Status: DC | PRN

## 2024-01-15 MED ORDER — DULOXETINE HCL 30 MG PO CPEP: 30.0000 mg | ORAL_CAPSULE | Freq: Every day | ORAL | 1 refills | Status: DC

## 2024-01-15 NOTE — Assessment & Plan Note (Signed)
 Restart gabapentin  300 mg 3 times daily instead of Lyrica  Still has chronic numbness and tingling of the bilateral LE

## 2024-01-15 NOTE — Assessment & Plan Note (Signed)
 BP: 102/73   Well-controlled with hydrochlorothiazide  Lisinopril  recently discontinued - cough improved Advised to stay compliant with medications Advised DASH diet and moderate exercise as tolerated

## 2024-01-15 NOTE — Assessment & Plan Note (Addendum)
 S/p cervical spine surgery in 06/25 Has neurosurgery follow-up Celebrex  as needed for mild to moderate pain Has oxycodone  5 mg as needed for severe pain On Lyrica  75 mg BID, switched to Gabapentin  300 mg TID for now as she reports it being more effective

## 2024-01-15 NOTE — Assessment & Plan Note (Addendum)
 Uncontrolled currently due to recent physical stressors Increased dose of Xanax  to 0.5 mg BID PRN Advised to avoid marijuana products

## 2024-01-15 NOTE — Assessment & Plan Note (Signed)
 MRI of cervical spine reviewed Needs neurosurgery follow-up Status post anterior cervical decompression/discectomy with C3-4, C4-5, C5-6 fusion Followed by neurosurgery Undergoing home OT

## 2024-01-15 NOTE — Assessment & Plan Note (Signed)
 Was evaluated by ENT specialist, was advised to get I&D, but she denied Was given IV antibiotics, followed by oral clindamycin  Remains afebrile, WBC trended down Recheck CBC and CMP today

## 2024-01-15 NOTE — Assessment & Plan Note (Signed)
 Hospital chart reviewed, including discharge summary Medications reconciled and reviewed with the patient in detail

## 2024-01-15 NOTE — Patient Instructions (Addendum)
 Please start taking Gabapentin  instead of Lyrica .  Please start taking Cymbalta as prescribed.  Please take Xanax  as prescribed for anxiety and insomnia.  Please contact Dr. Llewellyn for follow up of tonsillar abscess.

## 2024-01-15 NOTE — Progress Notes (Signed)
 Established Patient Office Visit  Subjective:  Patient ID: Brenda Taylor, female    DOB: 10/02/1970  Age: 53 y.o. MRN: [id number]  CC:  Chief Complaint  Patient presents with   Hypertension    Two month follow up   Leg Swelling    Swelling off and on in left foot     HPI Brenda Taylor is a 53 y.o. female with past medical history of hypertension, anxiety and colonic polyp who presents for f/u of her chronic medical conditions and recent hospitalizations.  HTN: BP is well-controlled. Takes medications regularly. Patient denies headache, dizziness, chest pain, dyspnea or palpitations.  Type II DM: Her HbA1c has improved to 6.4 now.  She takes metformin  500 mg QD. She has chronic fatigue, but denies polyuria or polyphagia.  Cervical spondylosis: She had anterior cervical decompression/discectomy, C3-4, C4-5, C5-6 fusion on 12/03/23. She has been taking gabapentin  400 mg TID.  Followed by spine surgery.  Undergoing OT.  She still c/o b/l leg pain, which is worse at nighttime. She also reports leg cramps at nighttime. She has been placed on Lyrica , but reports better response to Gabapentin  in the past.  Anxiety: Takes PRN Xanax , has stopped taking Buspar .  Takes Xanax  daily since her surgery. She has been more anxious since her surgery as she is not able to do her routine activities and is stressed whether she would be able to return to work. Denies SI or HI.  She had recent hospitalization for peritonsillar abscess 2 weeks ago, was managed medically with IV antibiotics followed by oral clindamycin . She denied I&D during hospitalization.  She has to schedule follow-up with ENT specialist -  Dr. Llewellyn.  Past Medical History:  Diagnosis Date   Anxiety    Arthritis    Chronic pain    DDD (degenerative disc disease)    Depression    Diabetes mellitus without complication (HCC)    GERD (gastroesophageal reflux disease)    HBP (high blood pressure)    Hernia    History of hiatal  hernia    Multiple gastric ulcers    Neuromuscular disorder (HCC)    Neuropathy bilateral feet and hands   Rotator cuff syndrome of left shoulder 10/09/2011   Ulcer disease     Past Surgical History:  Procedure Laterality Date   ANTERIOR CERVICAL DECOMP/DISCECTOMY FUSION N/A 12/03/2023   Procedure: ANTERIOR CERVICAL DECOMPRESSION/DISCECTOMY FUSION CERVICAL THREE-FOUR CERVICAL FOUR-FIVE CERVICAL FIVE-SIX EXPLORE FUSION SIX-SEVEN;  Surgeon: Debby Dorn MATSU, MD;  Location: [facility];  Service: Neurosurgery;  Laterality: N/A;  3C   APPENDECTOMY     BACK SURGERY     x5   BIOPSY  05/04/2021   Procedure: BIOPSY;  Surgeon: Cindie Carlin POUR, DO;  Location: [facility];  Service: Endoscopy;;   CARPAL TUNNEL RELEASE     bilateral   CHOLECYSTECTOMY     COLONOSCOPY  10/20/2009   MFM:qmpjaoz anal canal/left-side diverticula and multiple polyps (hyperplastic). poor prep compromised exam. Next TCS 09/2014.   COLONOSCOPY WITH PROPOFOL  N/A 05/04/2021   Procedure: COLONOSCOPY WITH PROPOFOL ;  Surgeon: Cindie Carlin POUR, DO;  Location: [facility];  Service: Endoscopy;  Laterality: N/A;  9:45am   ESOPHAGOGASTRODUODENOSCOPY  03/06/2010   MFM:qnlm quadrant distal esophageal/patent tubular esophagus/small HH, antral erosions and ulcerations. Bx negative for persistent H.pylori   ESOPHAGOGASTRODUODENOSCOPY  10/20/2009   MFM:dfjoo HH/prepyloric antral ulcer s/p bx (+h.pylori), ERE   ESOPHAGOGASTRODUODENOSCOPY (EGD) WITH PROPOFOL  N/A 05/04/2021   Procedure: ESOPHAGOGASTRODUODENOSCOPY (EGD) WITH PROPOFOL ;  Surgeon: Cindie Carlin POUR, DO;  Location: [facility];  Service: Endoscopy;  Laterality: N/A;   INTRAUTERINE DEVICE INSERTION     LEEP N/A 02/14/2021   Procedure: LOOP ELECTROSURGICAL EXCISION PROCEDURE (LEEP);  Surgeon: Ozan, Jennifer, DO;  Location: [facility];  Service: Gynecology;  Laterality: N/A;   MASTECTOMY, PARTIAL Left    x 2   MOUTH SURGERY     All top teeth removed and 6-8 of bottom teeth  removed   POLYPECTOMY  05/04/2021   Procedure: POLYPECTOMY;  Surgeon: Cindie Carlin POUR, DO;  Location: [facility];  Service: Endoscopy;;   ROTATOR CUFF REPAIR     right   TUBAL LIGATION      Family History  Problem Relation Age of Onset   Heart disease Other    Arthritis Other    Cancer Other    Asthma Other    Diabetes Other    Heart disease Mother    Diabetes Mother    Cancer Mother    Non-Hodgkin's lymphoma Mother    Hypertension Mother    Arthritis Mother    Lung cancer Father    Neuropathy Father    Hypertension Father     Social History   Socioeconomic History   Marital status: Widowed    Spouse name: Not on file   Number of children: Not on file   Years of education: 9   Highest education level: Not on file  Occupational History   Occupation: Conservation officer, nature  Tobacco Use   Smoking status: Every Day    Current packs/day: 0.50    Types: Cigarettes    Passive exposure: Current   Smokeless tobacco: Never   Tobacco comments:    15 cigarettes a day as of 11/06/23  Vaping Use   Vaping status: Never Used  Substance and Sexual Activity   Alcohol use: No   Drug use: Not Currently    Frequency: 7.0 times per week    Types: Marijuana    Comment: Quit marijuana 12/31/2022   Sexual activity: Yes    Birth control/protection: I.U.D.  Other Topics Concern   Not on file  Social History Narrative   Not on file   Social Drivers of Health   Financial Resource Strain: Medium Risk (12/26/2020)   Overall Financial Resource Strain (CARDIA)    Difficulty of Paying Living Expenses: Somewhat hard  Food Insecurity: No Food Insecurity (01/06/2024)   Hunger Vital Sign    Worried About Running Out of Food in the Last Year: Never true    Ran Out of Food in the Last Year: Never true  Transportation Needs: No Transportation Needs (01/06/2024)   PRAPARE - Administrator, Civil Service (Medical): No    Lack of Transportation (Non-Medical): No  Physical Activity: Inactive  (12/26/2020)   Exercise Vital Sign    Days of Exercise per Week: 0 days    Minutes of Exercise per Session: 0 min  Stress: Stress Concern Present (12/26/2020)   [employer] of Occupational Health - Occupational Stress Questionnaire    Feeling of Stress : To some extent  Social Connections: Socially Isolated (12/26/2020)   Social Connection and Isolation Panel    Frequency of Communication with Friends and Family: Three times a week    Frequency of Social Gatherings with Friends and Family: Once a week    Attends Religious Services: Never    Database administrator or Organizations: Not on file    Attends Banker Meetings: Never  Marital Status: Widowed  Intimate Partner Violence: Not At Risk (01/06/2024)   Humiliation, Afraid, Rape, and Kick questionnaire    Fear of Current or Ex-Partner: No    Emotionally Abused: No    Physically Abused: No    Sexually Abused: No    Outpatient Medications Prior to Visit  Medication Sig Dispense Refill   gabapentin  (NEURONTIN ) 300 MG capsule Take 300 mg by mouth 3 (three) times daily.     Glucose Blood (BLOOD GLUCOSE TEST STRIPS) STRP 1 each by In Vitro route in the morning, at noon, and at bedtime. May substitute to any manufacturer covered by patient's insurance. 100 strip 5   hydrochlorothiazide  (HYDRODIURIL ) 25 MG tablet TAKE 1 TABLET BY MOUTH ONCE A DAY. 30 tablet 5   metFORMIN  (GLUCOPHAGE -XR) 500 MG 24 hr tablet TAKE ONE TABLET (=500MG  TOTAL) EVERY DAY WITH BREAKFAST 30 tablet 0   methocarbamol  (ROBAXIN ) 500 MG tablet Take 1 tablet (500 mg total) by mouth every 6 (six) hours as needed for muscle spasms. 120 tablet 1   oxyCODONE  (OXY IR/ROXICODONE ) 5 MG immediate release tablet Take 5-10 mg by mouth every 6 (six) hours as needed (pain).     pantoprazole  (PROTONIX ) 40 MG tablet TAKE (1) TABLET BY MOUTH TWICE DAILY BEFORE MEALS. 60 tablet 0   rosuvastatin  (CRESTOR ) 5 MG tablet TAKE ONE TABLET BY MOUTH ONCE DAILY. 30 tablet 11    ALPRAZolam  (XANAX ) 0.5 MG tablet TAKE ONE TABLET BY MOUTH AT BEDTIME AS NEEDED FOR ANXIETY. 30 tablet 0   pregabalin  (LYRICA ) 75 MG capsule Take 1 capsule by mouth 2 (two) times daily.     No facility-administered medications prior to visit.    Allergies  Allergen Reactions   Aspirin Other (See Comments)    Cannot take due to ulcers   Nsaids Other (See Comments)    Cannot take due to ulcers    Amoxicillin Hives    ROS Review of Systems  Constitutional:  Negative for chills and fever.  HENT:  Positive for hearing loss (Right sided). Negative for congestion, postnasal drip, sinus pressure, sinus pain and sore throat.   Eyes:  Negative for pain and discharge.  Respiratory:  Negative for cough and shortness of breath.   Cardiovascular:  Positive for leg swelling. Negative for chest pain and palpitations.  Gastrointestinal:  Negative for abdominal pain, diarrhea, nausea and vomiting.  Endocrine: Negative for polydipsia and polyuria.  Genitourinary:  Negative for dysuria and hematuria.  Musculoskeletal:  Positive for arthralgias and neck pain. Negative for neck stiffness.  Skin:  Negative for rash.  Neurological:  Positive for weakness (B/l hands) and numbness. Negative for dizziness, seizures, syncope, speech difficulty and light-headedness.  Psychiatric/Behavioral:  Positive for sleep disturbance. Negative for agitation and behavioral problems. The patient is nervous/anxious.       Objective:    Physical Exam Vitals reviewed.  Constitutional:      General: She is not in acute distress.    Appearance: She is obese. She is not diaphoretic.  HENT:     Head: Normocephalic and atraumatic.     Nose: Nose normal. No congestion.     Mouth/Throat:     Mouth: Mucous membranes are moist.     Pharynx: No posterior oropharyngeal erythema.   Eyes:     General: No scleral icterus.    Extraocular Movements: Extraocular movements intact.   Neck:     Vascular: No carotid bruit.      Comments: Incision site C/D/I Cardiovascular:  Rate and Rhythm: Normal rate and regular rhythm.     Heart sounds: Normal heart sounds. No murmur heard. Pulmonary:     Breath sounds: Normal breath sounds. No wheezing or rales.   Musculoskeletal:        General: Tenderness (MCP joints of index and middle finger of left hand) present.     Cervical back: Neck supple. No tenderness.     Right lower leg: No edema.     Left lower leg: No edema.   Skin:    General: Skin is warm.     Findings: No rash.   Neurological:     General: No focal deficit present.     Mental Status: She is alert and oriented to person, place, and time.     Sensory: No sensory deficit.     Motor: Weakness (LUE - 4/5) present.   Psychiatric:        Mood and Affect: Mood normal.        Behavior: Behavior normal.     BP 102/73   Pulse (!) 101   Ht 5' 6 (1.676 m)   Wt 277 lb 3.2 oz (125.7 kg)   LMP  (LMP Unknown)   SpO2 95%   BMI 44.74 kg/m  Wt Readings from Last 3 Encounters:  01/15/24 277 lb 3.2 oz (125.7 kg)  12/30/23 274 lb 0.5 oz (124.3 kg)  12/03/23 274 lb (124.3 kg)    Lab Results  Component Value Date   TSH 2.510 11/13/2022   Lab Results  Component Value Date   WBC 10.3 01/15/2024   HGB 12.9 01/15/2024   HCT 40.1 01/15/2024   MCV 93 01/15/2024   PLT 251 01/15/2024   Lab Results  Component Value Date   NA 143 01/15/2024   K 4.0 01/15/2024   CO2 20 01/15/2024   GLUCOSE 152 (H) 01/15/2024   BUN 19 01/15/2024   CREATININE 1.06 (H) 01/15/2024   BILITOT 0.3 01/15/2024   ALKPHOS 95 01/15/2024   AST 16 01/15/2024   ALT 18 01/15/2024   PROT 6.9 01/15/2024   ALBUMIN  3.8 01/15/2024   CALCIUM  9.4 01/15/2024   ANIONGAP 10 01/02/2024   EGFR 62 01/15/2024   Lab Results  Component Value Date   CHOL 155 11/13/2022   Lab Results  Component Value Date   HDL 29 (L) 11/13/2022   Lab Results  Component Value Date   LDLCALC 103 (H) 11/13/2022   Lab Results  Component Value Date    TRIG 125 11/13/2022   Lab Results  Component Value Date   CHOLHDL 5.3 (H) 11/13/2022   Lab Results  Component Value Date   HGBA1C 6.4 (H) 11/06/2023      Assessment & Plan:   Problem List Items Addressed This Visit       Cardiovascular and Mediastinum   Hypertension   BP: 102/73   Well-controlled with hydrochlorothiazide  Lisinopril  recently discontinued - cough improved Advised to stay compliant with medications Advised DASH diet and moderate exercise as tolerated      Relevant Orders   CBC with Differential/Platelet (Completed)   CMP14+EGFR (Completed)     Respiratory   Peritonsillar abscess - Primary   Was evaluated by ENT specialist, was advised to get I&D, but she denied Was given IV antibiotics, followed by oral clindamycin  Remains afebrile, WBC trended down Recheck CBC and CMP today      Relevant Orders   CBC with Differential/Platelet (Completed)   CMP14+EGFR (Completed)     Endocrine  Diabetic neuropathy (HCC)   Restart gabapentin  300 mg 3 times daily instead of Lyrica  Still has chronic numbness and tingling of the bilateral LE      Type 2 diabetes mellitus with other specified complication (HCC)   Lab Results  Component Value Date   HGBA1C 6.4 (H) 11/06/2023   Well controlled now Associated with HTN and HLD On metformin  500 mg once daily Advised to follow diabetic diet On statin F/u CMP and lipid panel Diabetic eye exam: Advised to follow up with Ophthalmology for diabetic eye exam      Relevant Orders   CMP14+EGFR (Completed)     Nervous and Auditory   Central cord syndrome [facility])   MRI of cervical spine reviewed Needs neurosurgery follow-up Status post anterior cervical decompression/discectomy with C3-4, C4-5, C5-6 fusion Followed by neurosurgery Undergoing home OT      Cervical spondylosis with radiculopathy   S/p cervical spine surgery in 06/25 Has neurosurgery follow-up Celebrex  as needed for mild to moderate pain Has  oxycodone  5 mg as needed for severe pain On Lyrica  75 mg BID, switched to Gabapentin  300 mg TID for now as she reports it being more effective      Relevant Medications   gabapentin  (NEURONTIN ) 300 MG capsule   DULoxetine  (CYMBALTA ) 30 MG capsule   ALPRAZolam  (XANAX ) 0.5 MG tablet     Other   GAD (generalized anxiety disorder) (Chronic)   Uncontrolled currently due to recent physical stressors Increased dose of Xanax  to 0.5 mg BID PRN Advised to avoid marijuana products      Relevant Medications   DULoxetine  (CYMBALTA ) 30 MG capsule   ALPRAZolam  (XANAX ) 0.5 MG tablet   Hospital discharge follow-up   Hospital chart reviewed, including discharge summary Medications reconciled and reviewed with the patient in detail      Moderate episode of recurrent major depressive disorder (HCC)   Uncontrolled due to recent physical stressors Has crying spells and anxiety as well Had lengthy discussion about coping mechanisms Started Cymbalta  considering her neuropathy symptoms as well      Relevant Medications   DULoxetine  (CYMBALTA ) 30 MG capsule   ALPRAZolam  (XANAX ) 0.5 MG tablet    Meds ordered this encounter  Medications   DULoxetine  (CYMBALTA ) 30 MG capsule    Sig: Take 1 capsule (30 mg total) by mouth daily.    Dispense:  30 capsule    Refill:  1   ALPRAZolam  (XANAX ) 0.5 MG tablet    Sig: Take 1 tablet (0.5 mg total) by mouth 2 (two) times daily as needed for anxiety.    Dispense:  60 tablet    Refill:  1    Today, I spent 51 minutes in this patient encounter, including reviewing recent hospitalization charts, adjusting medications and counseling for medication compliance, amongst which more than half of the time was spent during face-to-face encounter with the patient.  Follow-up: Return in about 6 weeks (around 02/26/2024).    Suzzane MARLA Blanch, MD

## 2024-01-15 NOTE — Assessment & Plan Note (Signed)
 Lab Results  Component Value Date   HGBA1C 6.4 (H) 11/06/2023   Well controlled now Associated with HTN and HLD On metformin  500 mg once daily Advised to follow diabetic diet On statin F/u CMP and lipid panel Diabetic eye exam: Advised to follow up with Ophthalmology for diabetic eye exam

## 2024-01-16 ENCOUNTER — Ambulatory Visit: Payer: Self-pay | Admitting: Internal Medicine

## 2024-01-16 LAB — CBC WITH DIFFERENTIAL/PLATELET
Basophils Absolute: 0.1 10*3/uL (ref 0.0–0.2)
Basos: 1 %
EOS (ABSOLUTE): 0.4 10*3/uL (ref 0.0–0.4)
Eos: 4 %
Hematocrit: 40.1 % (ref 34.0–46.6)
Hemoglobin: 12.9 g/dL (ref 11.1–15.9)
Immature Grans (Abs): 0.1 10*3/uL (ref 0.0–0.1)
Immature Granulocytes: 1 %
Lymphocytes Absolute: 4.1 10*3/uL — ABNORMAL HIGH (ref 0.7–3.1)
Lymphs: 40 %
MCH: 30 pg (ref 26.6–33.0)
MCHC: 32.2 g/dL (ref 31.5–35.7)
MCV: 93 fL (ref 79–97)
Monocytes Absolute: 0.7 10*3/uL (ref 0.1–0.9)
Monocytes: 7 %
Neutrophils Absolute: 4.9 10*3/uL (ref 1.4–7.0)
Neutrophils: 47 %
Platelets: 251 10*3/uL (ref 150–450)
RBC: 4.3 x10E6/uL (ref 3.77–5.28)
RDW: 13.7 % (ref 11.7–15.4)
WBC: 10.3 10*3/uL (ref 3.4–10.8)

## 2024-01-16 LAB — CMP14+EGFR
ALT: 18 IU/L (ref 0–32)
AST: 16 IU/L (ref 0–40)
Albumin: 3.8 g/dL (ref 3.8–4.9)
Alkaline Phosphatase: 95 IU/L (ref 44–121)
BUN/Creatinine Ratio: 18 (ref 9–23)
BUN: 19 mg/dL (ref 6–24)
Bilirubin Total: 0.3 mg/dL (ref 0.0–1.2)
CO2: 20 mmol/L (ref 20–29)
Calcium: 9.4 mg/dL (ref 8.7–10.2)
Chloride: 105 mmol/L (ref 96–106)
Creatinine, Ser: 1.06 mg/dL — ABNORMAL HIGH (ref 0.57–1.00)
Globulin, Total: 3.1 g/dL (ref 1.5–4.5)
Glucose: 152 mg/dL — ABNORMAL HIGH (ref 70–99)
Potassium: 4 mmol/L (ref 3.5–5.2)
Sodium: 143 mmol/L (ref 134–144)
Total Protein: 6.9 g/dL (ref 6.0–8.5)
eGFR: 62 mL/min/{1.73_m2} (ref >59)

## 2024-01-17 DIAGNOSIS — F331 Major depressive disorder, recurrent, moderate: Secondary | ICD-10-CM | POA: Insufficient documentation

## 2024-01-17 NOTE — Assessment & Plan Note (Signed)
 Uncontrolled due to recent physical stressors Has crying spells and anxiety as well Had lengthy discussion about coping mechanisms Started Cymbalta  considering her neuropathy symptoms as well

## 2024-01-21 ENCOUNTER — Other Ambulatory Visit: Payer: Self-pay | Admitting: Internal Medicine

## 2024-01-21 ENCOUNTER — Telehealth: Payer: Self-pay

## 2024-01-21 NOTE — Telephone Encounter (Signed)
 Copied from CRM [id number]. Topic: Clinical - Home Health Verbal Orders >> Jan 21, 2024 12:30 PM Turkey B wrote: Caller/Agency: Malorie from [facility] Number: [phone]  Service Requested: Occupational Therapy Frequency: 1x3 Any new concerns about the patient? no

## 2024-01-21 NOTE — Telephone Encounter (Signed)
 Left detailed vm approving orders.

## 2024-01-28 ENCOUNTER — Other Ambulatory Visit: Payer: Self-pay | Admitting: Internal Medicine

## 2024-01-28 DIAGNOSIS — E1169 Type 2 diabetes mellitus with other specified complication: Secondary | ICD-10-CM

## 2024-01-28 MED ORDER — METFORMIN HCL ER 500 MG PO TB24: 500.0000 mg | ORAL_TABLET | Freq: Every day | ORAL | 0 refills | Status: DC

## 2024-01-28 NOTE — Telephone Encounter (Signed)
 Copied from CRM [id number]. Topic: Clinical - Medication Refill >> Jan 28, 2024  1:25 PM Berwyn MATSU wrote: Medication: metFORMIN  (GLUCOPHAGE -XR) 500 MG 24 hr tablet  Has the patient contacted their pharmacy? Yes (Agent: If no, request that the patient contact the pharmacy for the refill. If patient does not wish to contact the pharmacy document the reason why and proceed with request.) (Agent: If yes, when and what did the pharmacy advise?)  This is the patient's preferred pharmacy:  [facility] - [place], [place] - [address] [place] [address] Phone: [phone] Fax: [phone]    Is this the correct pharmacy for this prescription? Yes If no, delete pharmacy and type the correct one.   Has the prescription been filled recently? Yes  Is the patient out of the medication? Yes  Has the patient been seen for an appointment in the last year OR does the patient have an upcoming appointment? Yes  Can we respond through MyChart? Yes  Agent: Please be advised that Rx refills may take up to 3 business days. We ask that you follow-up with your pharmacy.

## 2024-01-30 ENCOUNTER — Telehealth: Payer: Self-pay

## 2024-01-30 NOTE — Telephone Encounter (Signed)
 Copied from CRM [id number]. Topic: General - Other >> Jan 29, 2024  4:17 PM Kevelyn M wrote: Lorie with [facility] called in to inform that patient missed appointment today. Patient did not want PT today.

## 2024-02-04 ENCOUNTER — Other Ambulatory Visit: Payer: Self-pay | Admitting: Surgery

## 2024-02-04 ENCOUNTER — Encounter: Payer: Self-pay | Admitting: Surgery

## 2024-02-04 DIAGNOSIS — M5416 Radiculopathy, lumbar region: Secondary | ICD-10-CM

## 2024-02-23 ENCOUNTER — Inpatient Hospital Stay
Admission: RE | Admit: 2024-02-23 | Discharge: 2024-02-23 | Disposition: A | Discharge status: Home / Self Care | Source: Ambulatory Visit | Attending: Surgery | Admitting: Surgery

## 2024-02-23 DIAGNOSIS — M5416 Radiculopathy, lumbar region: Secondary | ICD-10-CM

## 2024-02-23 MED ORDER — GADOPICLENOL 0.5 MMOL/ML IV SOLN
10.0000 mL | Freq: Once | INTRAVENOUS | Status: AC | PRN
  Administered 2024-02-23: 10 mL via INTRAVENOUS

## 2024-02-27 ENCOUNTER — Other Ambulatory Visit: Payer: Self-pay | Admitting: Internal Medicine

## 2024-02-27 DIAGNOSIS — E1169 Type 2 diabetes mellitus with other specified complication: Secondary | ICD-10-CM

## 2024-02-27 NOTE — Telephone Encounter (Signed)
 Copied from CRM [id number]. Topic: Clinical - Medication Refill >> Feb 27, 2024 10:28 AM Zebedee SAUNDERS wrote: Medication: metFORMIN  (GLUCOPHAGE -XR) 500 MG 24 hr tablet  Has the patient contacted their pharmacy? Yes (Agent: If no, request that the patient contact the pharmacy for the refill. If patient does not wish to contact the pharmacy document the reason why and proceed with request.) (Agent: If yes, when and what did the pharmacy advise?)  This is the patient's preferred pharmacy:  [facility] - [place], [place] - [address] [place] [address] Phone: [phone] Fax: [phone]  Is this the correct pharmacy for this prescription? Yes If no, delete pharmacy and type the correct one.   Has the prescription been filled recently? Yes  Is the patient out of the medication? Yes  Has the patient been seen for an appointment in the last year OR does the patient have an upcoming appointment? Yes  Can we respond through MyChart? Yes  Agent: Please be advised that Rx refills may take up to 3 business days. We ask that you follow-up with your pharmacy.

## 2024-03-04 ENCOUNTER — Telehealth: Payer: Self-pay | Admitting: Internal Medicine

## 2024-03-04 ENCOUNTER — Other Ambulatory Visit: Payer: Self-pay | Admitting: Internal Medicine

## 2024-03-04 ENCOUNTER — Ambulatory Visit: Admitting: Internal Medicine

## 2024-03-04 DIAGNOSIS — F331 Major depressive disorder, recurrent, moderate: Secondary | ICD-10-CM

## 2024-03-04 DIAGNOSIS — F411 Generalized anxiety disorder: Secondary | ICD-10-CM

## 2024-03-04 NOTE — Telephone Encounter (Signed)
 Patient unable to make medical clearance appt today- next available is in September, pt says she needs it done before then. Please advise Thank you

## 2024-03-09 ENCOUNTER — Telehealth (INDEPENDENT_AMBULATORY_CARE_PROVIDER_SITE_OTHER): Payer: Self-pay | Admitting: Internal Medicine

## 2024-03-09 VITALS — BP 110/66 | HR 89 | Temp 98.2°F | Ht 65.0 in | Wt 273.0 lb

## 2024-03-09 DIAGNOSIS — Z01818 Encounter for other preprocedural examination: Secondary | ICD-10-CM

## 2024-03-09 DIAGNOSIS — E1169 Type 2 diabetes mellitus with other specified complication: Secondary | ICD-10-CM

## 2024-03-09 DIAGNOSIS — M51362 Other intervertebral disc degeneration, lumbar region with discogenic back pain and lower extremity pain: Secondary | ICD-10-CM | POA: Diagnosis not present

## 2024-03-09 DIAGNOSIS — I10 Essential (primary) hypertension: Secondary | ICD-10-CM | POA: Diagnosis not present

## 2024-03-09 MED ORDER — METFORMIN HCL ER 500 MG PO TB24: 500.0000 mg | ORAL_TABLET | Freq: Every day | ORAL | 1 refills | Status: DC

## 2024-03-09 NOTE — Assessment & Plan Note (Signed)
 EKG (04/25): Sinus rhythm. No signs of active ischemia. BP wnl, DM controlled RCRI: 0  Medically optimized for lumbar spine surgery with moderate risk (h/o HTN and DM), needs to cut down -> quit smoking for better wound healing

## 2024-03-09 NOTE — Progress Notes (Signed)
 Virtual Visit via Video Note   Because of Brenda Taylor's co-morbid illnesses, she is at least at moderate risk for complications without adequate follow up.  This format is felt to be most appropriate for this patient at this time.  All issues noted in this document were discussed and addressed.  A limited physical exam was performed with this format.      Evaluation Performed:  Follow-up visit  Date:  03/09/2024   ID:  Brenda Taylor, DOB Dec 01, 1970, MRN [id number]  Patient Location: Home Provider Location: Office/Clinic  Participants: Patient Location of Patient: Home Location of Provider: Telehealth Consent was obtain for visit to be over via telehealth. I verified that I am speaking with the correct person using two identifiers.  PCP:  Tobie Suzzane POUR, MD   Chief Complaint: Preoperative clearance  History of Present Illness:    [name] is a 53 y.o. female with PMH of hypertension, type 2 DM, anxiety, OA and colonic polyp who has a video visit for preoperative clearance for lumbar fusion surgery.  HTN: BP is well-controlled. Takes medications regularly. Patient denies headache, dizziness, chest pain, dyspnea or palpitations.   Type II DM: Her HbA1c has improved to 6.4 now.  She takes metformin  500 mg QD. She has chronic fatigue, but denies polyuria or polyphagia.  Anxiety: Takes PRN Xanax , has stopped taking Buspar .  Takes Xanax  daily since her cervical spine surgery. She has been more anxious since her surgery as she is not able to do her routine activities and is stressed whether she would be able to return to work. Denies SI or HI.  Lumbar DDD: She has chronic low back pain, constant, sharp, radiating towards bilateral LE and is associated with bilateral LE numbness and weakness.  She has history of lumbar spine surgeries in the past.  She currently takes gabapentin  300 mg BID.  She has done PT in the past.  She is followed by spine surgery, planned to get lumbar  fusion surgery.  The patient does not have symptoms concerning for COVID-19 infection (fever, chills, cough, or new shortness of breath).   Past Medical, Surgical, Social History, Allergies, and Medications have been Reviewed.  Past Medical History:  Diagnosis Date   Anxiety    Arthritis    Chronic pain    DDD (degenerative disc disease)    Depression    Diabetes mellitus without complication (HCC)    GERD (gastroesophageal reflux disease)    HBP (high blood pressure)    Hernia    History of hiatal hernia    Multiple gastric ulcers    Neuromuscular disorder (HCC)    Neuropathy bilateral feet and hands   Rotator cuff syndrome of left shoulder 10/09/2011   Ulcer disease    Past Surgical History:  Procedure Laterality Date   ANTERIOR CERVICAL DECOMP/DISCECTOMY FUSION N/A 12/03/2023   Procedure: ANTERIOR CERVICAL DECOMPRESSION/DISCECTOMY FUSION CERVICAL THREE-FOUR CERVICAL FOUR-FIVE CERVICAL FIVE-SIX EXPLORE FUSION SIX-SEVEN;  Surgeon: Debby Dorn MATSU, MD;  Location: [facility] OR;  Service: Neurosurgery;  Laterality: N/A;  3C   APPENDECTOMY     BACK SURGERY     x5   BIOPSY  05/04/2021   Procedure: BIOPSY;  Surgeon: Cindie Carlin POUR, DO;  Location: [facility];  Service: Endoscopy;;   CARPAL TUNNEL RELEASE     bilateral   CHOLECYSTECTOMY     COLONOSCOPY  10/20/2009   MFM:qmpjaoz anal canal/left-side diverticula and multiple polyps (hyperplastic). poor prep compromised exam. Next TCS  09/2014.   COLONOSCOPY WITH PROPOFOL  N/A 05/04/2021   Procedure: COLONOSCOPY WITH PROPOFOL ;  Surgeon: Cindie Carlin POUR, DO;  Location: [facility];  Service: Endoscopy;  Laterality: N/A;  9:45am   ESOPHAGOGASTRODUODENOSCOPY  03/06/2010   MFM:qnlm quadrant distal esophageal/patent tubular esophagus/small HH, antral erosions and ulcerations. Bx negative for persistent H.pylori   ESOPHAGOGASTRODUODENOSCOPY  10/20/2009   MFM:dfjoo HH/prepyloric antral ulcer s/p bx (+h.pylori), ERE    ESOPHAGOGASTRODUODENOSCOPY (EGD) WITH PROPOFOL  N/A 05/04/2021   Procedure: ESOPHAGOGASTRODUODENOSCOPY (EGD) WITH PROPOFOL ;  Surgeon: Cindie Carlin POUR, DO;  Location: [facility];  Service: Endoscopy;  Laterality: N/A;   INTRAUTERINE DEVICE INSERTION     LEEP N/A 02/14/2021   Procedure: LOOP ELECTROSURGICAL EXCISION PROCEDURE (LEEP);  Surgeon: Ozan, Jennifer, DO;  Location: [facility];  Service: Gynecology;  Laterality: N/A;   MASTECTOMY, PARTIAL Left    x 2   MOUTH SURGERY     All top teeth removed and 6-8 of bottom teeth removed   POLYPECTOMY  05/04/2021   Procedure: POLYPECTOMY;  Surgeon: Cindie Carlin POUR, DO;  Location: [facility];  Service: Endoscopy;;   ROTATOR CUFF REPAIR     right   TUBAL LIGATION       Current Meds  Medication Sig   ALPRAZolam  (XANAX ) 0.5 MG tablet Take 1 tablet (0.5 mg total) by mouth 2 (two) times daily as needed for anxiety.   DULoxetine  (CYMBALTA ) 30 MG capsule Take 1 capsule (30 mg total) by mouth daily.   gabapentin  (NEURONTIN ) 300 MG capsule Take 300 mg by mouth 3 (three) times daily.   Glucose Blood (BLOOD GLUCOSE TEST STRIPS) STRP 1 each by In Vitro route in the morning, at noon, and at bedtime. May substitute to any manufacturer covered by patient's insurance.   hydrochlorothiazide  (HYDRODIURIL ) 25 MG tablet TAKE 1 TABLET BY MOUTH ONCE A DAY.   metFORMIN  (GLUCOPHAGE -XR) 500 MG 24 hr tablet Take 1 tablet (500 mg total) by mouth daily with breakfast.   methocarbamol  (ROBAXIN ) 500 MG tablet Take 1 tablet (500 mg total) by mouth every 6 (six) hours as needed for muscle spasms.   oxyCODONE  (OXY IR/ROXICODONE ) 5 MG immediate release tablet Take 5-10 mg by mouth every 6 (six) hours as needed (pain).   pantoprazole  (PROTONIX ) 40 MG tablet TAKE (1) TABLET BY MOUTH TWICE DAILY BEFORE MEALS.   rosuvastatin  (CRESTOR ) 5 MG tablet TAKE ONE TABLET BY MOUTH ONCE DAILY.     Allergies:   Aspirin, Nsaids, and Amoxicillin   ROS:   Please see the history of present  illness. All other systems reviewed and are negative.   Labs/Other Tests and Data Reviewed:    Recent Labs: 01/02/2024: Magnesium 1.9 01/15/2024: ALT 18; BUN 19; Creatinine, Ser 1.06; Hemoglobin 12.9; Platelets 251; Potassium 4.0; Sodium 143   Recent Lipid Panel Lab Results  Component Value Date/Time   CHOL 155 11/13/2022 04:54 PM   TRIG 125 11/13/2022 04:54 PM   HDL 29 (L) 11/13/2022 04:54 PM   CHOLHDL 5.3 (H) 11/13/2022 04:54 PM   LDLCALC 103 (H) 11/13/2022 04:54 PM    Wt Readings from Last 3 Encounters:  03/09/24 273 lb (123.8 kg)  01/15/24 277 lb 3.2 oz (125.7 kg)  12/30/23 274 lb 0.5 oz (124.3 kg)     Objective:    Vital Signs:  Ht 5' 5 (1.651 m)   Wt 273 lb (123.8 kg)   LMP  (LMP Unknown)   BMI 45.43 kg/m    VITAL SIGNS:  reviewed GEN:  no acute  distress EYES:  sclerae anicteric, EOMI - Extraocular Movements Intact RESPIRATORY:  normal respiratory effort, symmetric expansion NEURO:  alert and oriented x 3, no obvious focal deficit PSYCH:  normal affect  ASSESSMENT & PLAN:    Preop examination EKG (04/25): Sinus rhythm. No signs of active ischemia. BP wnl, DM controlled RCRI: 0  Medically optimized for lumbar spine surgery with moderate risk (h/o HTN and DM), needs to cut down -> quit smoking for better wound healing  Type 2 diabetes mellitus with other specified complication (HCC) Lab Results  Component Value Date   HGBA1C 6.4 (H) 11/06/2023   Well controlled now Associated with HTN and HLD On metformin  500 mg once daily Advised to follow diabetic diet On statin F/u CMP and lipid panel Diabetic eye exam: Advised to follow up with Ophthalmology for diabetic eye exam  Hypertension     Well-controlled with hydrochlorothiazide  Lisinopril  recently discontinued - cough improved Advised to stay compliant with medications Advised DASH diet and moderate exercise as tolerated  DDD (degenerative disc disease), lumbar Has chronic low back pain, history  of lumbar spine surgeries On gabapentin  300 mg 3 times daily Flexeril  as needed for muscle spasms Follow-up with neurosurgery - planned to get lumbar fusion surgery   I discussed the assessment and treatment plan with the patient. The patient was provided an opportunity to ask questions, and all were answered. The patient agreed with the plan and demonstrated an understanding of the instructions.   The patient was advised to call back or seek an in-person evaluation if the symptoms worsen or if the condition fails to improve as anticipated.  The above assessment and management plan was discussed with the patient. The patient verbalized understanding of and has agreed to the management plan.   Medication Adjustments/Labs and Tests Ordered: Current medicines are reviewed at length with the patient today.  Concerns regarding medicines are outlined above.   Tests Ordered: No orders of the defined types were placed in this encounter.   Medication Changes: No orders of the defined types were placed in this encounter.    Note: This dictation was prepared with Dragon dictation along with smaller phrase technology. Similar sounding words can be transcribed inadequately or may not be corrected upon review. Any transcriptional errors that result from this process are unintentional.      Disposition:  Follow up  Signed, Suzzane MARLA Blanch, MD  03/09/2024 3:25 PM     [facility]

## 2024-03-09 NOTE — Patient Instructions (Signed)
 Please continue to take medications as prescribed.  Please continue to follow low carb diet and perform moderate exercise/walking as tolerated.

## 2024-03-09 NOTE — Assessment & Plan Note (Signed)
 Lab Results  Component Value Date   HGBA1C 6.4 (H) 11/06/2023   Well controlled now Associated with HTN and HLD On metformin  500 mg once daily Advised to follow diabetic diet On statin F/u CMP and lipid panel Diabetic eye exam: Advised to follow up with Ophthalmology for diabetic eye exam

## 2024-03-09 NOTE — Assessment & Plan Note (Signed)
   Well-controlled with hydrochlorothiazide  Lisinopril  recently discontinued - cough improved Advised to stay compliant with medications Advised DASH diet and moderate exercise as tolerated

## 2024-03-09 NOTE — Assessment & Plan Note (Signed)
 Has chronic low back pain, history of lumbar spine surgeries On gabapentin  300 mg 3 times daily Flexeril  as needed for muscle spasms Follow-up with neurosurgery - planned to get lumbar fusion surgery

## 2024-03-16 ENCOUNTER — Other Ambulatory Visit: Payer: Self-pay | Admitting: Internal Medicine

## 2024-03-16 DIAGNOSIS — F411 Generalized anxiety disorder: Secondary | ICD-10-CM

## 2024-03-18 ENCOUNTER — Other Ambulatory Visit: Payer: Self-pay | Admitting: Neurosurgery

## 2024-03-18 NOTE — Pre-Procedure Instructions (Signed)
 Surgical Instructions   Your procedure is scheduled on March 25, 2024. Report to [facility] Main Entrance A at 10:30 A.M., then check in with the Admitting office. Any questions or running late day of surgery: call [phone]  Questions prior to your surgery date: call [phone], Monday-Friday, 8am-4pm. If you experience any cold or flu symptoms such as cough, fever, chills, shortness of breath, etc. between now and your scheduled surgery, please notify us  at the above number.     Remember:  Do not eat after midnight the night before your surgery  You may drink clear liquids until 9:30 AM the morning of your surgery.   Clear liquids allowed are: Water , Non-Citrus Juices (without pulp), Carbonated Beverages, Clear Tea (no milk, honey, etc.), Black Coffee Only (NO MILK, CREAM OR POWDERED CREAMER of any kind), and Gatorade.    Take these medicines the morning of surgery with A SIP OF WATER : DULoxetine  (CYMBALTA )  gabapentin  (NEURONTIN )  pantoprazole  (PROTONIX )  rosuvastatin  (CRESTOR )    May take these medicines IF NEEDED: ALPRAZolam  (XANAX )  methocarbamol  (ROBAXIN )  oxyCODONE  (OXY IR/ROXICODONE )    One week prior to surgery, STOP taking any Aspirin (unless otherwise instructed by your surgeon) Aleve , Naproxen , Ibuprofen , Motrin , Advil , Goody's, BC's, all herbal medications, fish oil, and non-prescription vitamins.   WHAT DO I DO ABOUT MY DIABETES MEDICATION?   Do not take metFORMIN  (GLUCOPHAGE -XR) the morning of surgery.   HOW TO MANAGE YOUR DIABETES BEFORE AND AFTER SURGERY  Why is it important to control my blood sugar before and after surgery? Improving blood sugar levels before and after surgery helps healing and can limit problems. A way of improving blood sugar control is eating a healthy diet by:  Eating less sugar and carbohydrates  Increasing activity/exercise  Talking with your doctor about reaching your blood sugar goals High blood sugars (greater  than 180 mg/dL) can raise your risk of infections and slow your recovery, so you will need to focus on controlling your diabetes during the weeks before surgery. Make sure that the doctor who takes care of your diabetes knows about your planned surgery including the date and location.  How do I manage my blood sugar before surgery? Check your blood sugar at least 4 times a day, starting 2 days before surgery, to make sure that the level is not too high or low.  Check your blood sugar the morning of your surgery when you wake up and every 2 hours until you get to the Short Stay unit.  If your blood sugar is less than 70 mg/dL, you will need to treat for low blood sugar: Do not take insulin . Treat a low blood sugar (less than 70 mg/dL) with  cup of clear juice (cranberry or apple), 4 glucose tablets, OR glucose gel. Recheck blood sugar in 15 minutes after treatment (to make sure it is greater than 70 mg/dL). If your blood sugar is not greater than 70 mg/dL on recheck, call [phone] for further instructions. Report your blood sugar to the short stay nurse when you get to Short Stay.  If you are admitted to the hospital after surgery: Your blood sugar will be checked by the staff and you will probably be given insulin  after surgery (instead of oral diabetes medicines) to make sure you have good blood sugar levels. The goal for blood sugar control after surgery is 80-180 mg/dL.  Do NOT Smoke (Tobacco/Vaping) for 24 hours prior to your procedure.  If you use a CPAP at night, you may bring your mask/headgear for your overnight stay.   You will be asked to remove any contacts, glasses, piercing's, hearing aid's, dentures/partials prior to surgery. Please bring cases for these items if needed.    Patients discharged the day of surgery will not be allowed to drive home, and someone needs to stay with them for 24 hours.  SURGICAL WAITING ROOM VISITATION Patients may have no  more than 2 support people in the waiting area - these visitors may rotate.   Pre-op nurse will coordinate an appropriate time for 1 ADULT support person, who may not rotate, to accompany patient in pre-op.  Children under the age of 70 must have an adult with them who is not the patient and must remain in the main waiting area with an adult.  If the patient needs to stay at the hospital during part of their recovery, the visitor guidelines for inpatient rooms apply.  Please refer to the [facility] website for the visitor guidelines for any additional information.   If you received a COVID test during your pre-op visit  it is requested that you wear a mask when out in public, stay away from anyone that may not be feeling well and notify your surgeon if you develop symptoms. If you have been in contact with anyone that has tested positive in the last 10 days please notify you surgeon.      Pre-operative 5 CHG Bathing Instructions   You can play a key role in reducing the risk of infection after surgery. Your skin needs to be as free of germs as possible. You can reduce the number of germs on your skin by washing with CHG (chlorhexidine  gluconate) soap before surgery. CHG is an antiseptic soap that kills germs and continues to kill germs even after washing.   DO NOT use if you have an allergy to chlorhexidine /CHG or antibacterial soaps. If your skin becomes reddened or irritated, stop using the CHG and notify one of our RNs at [phone].   Please shower with the CHG soap starting 4 days before surgery using the following schedule:     Please keep in mind the following:  DO NOT shave, including legs and underarms, starting the day of your first shower.   You may shave your face at any point before/day of surgery.  Place clean sheets on your bed the day you start using CHG soap. Use a clean washcloth (not used since being washed) for each shower. DO NOT sleep with pets once you start  using the CHG.   CHG Shower Instructions:  Wash your face and private area with normal soap. If you choose to wash your hair, wash first with your normal shampoo.  After you use shampoo/soap, rinse your hair and body thoroughly to remove shampoo/soap residue.  Turn the water  OFF and apply about 3 tablespoons (45 ml) of CHG soap to a CLEAN washcloth.  Apply CHG soap ONLY FROM YOUR NECK DOWN TO YOUR TOES (washing for 3-5 minutes)  DO NOT use CHG soap on face, private areas, open wounds, or sores.  Pay special attention to the area where your surgery is being performed.  If you are having back surgery, having someone wash your back for you may be helpful. Wait 2 minutes after CHG soap is applied, then you may rinse off the CHG soap.  Pat dry with a  clean towel  Put on clean clothes/pajamas   If you choose to wear lotion, please use ONLY the CHG-compatible lotions that are listed below.  Additional instructions for the day of surgery: DO NOT APPLY any lotions, deodorants, cologne, or perfumes.   Do not bring valuables to the hospital. [facility] is not responsible for any belongings/valuables. Do not wear nail polish, gel polish, artificial nails, or any other type of covering on natural nails (fingers and toes) Do not wear jewelry or makeup Put on clean/comfortable clothes.  Please brush your teeth.  Ask your nurse before applying any prescription medications to the skin.     CHG Compatible Lotions   Aveeno Moisturizing lotion  Cetaphil Moisturizing Cream  Cetaphil Moisturizing Lotion  Clairol Herbal Essence Moisturizing Lotion, Dry Skin  Clairol Herbal Essence Moisturizing Lotion, Extra Dry Skin  Clairol Herbal Essence Moisturizing Lotion, Normal Skin  Curel Age Defying Therapeutic Moisturizing Lotion with Alpha Hydroxy  Curel Extreme Care Body Lotion  Curel Soothing Hands Moisturizing Hand Lotion  Curel Therapeutic Moisturizing Cream, Fragrance-Free  Curel Therapeutic  Moisturizing Lotion, Fragrance-Free  Curel Therapeutic Moisturizing Lotion, Original Formula  Eucerin Daily Replenishing Lotion  Eucerin Dry Skin Therapy Plus Alpha Hydroxy Crme  Eucerin Dry Skin Therapy Plus Alpha Hydroxy Lotion  Eucerin Original Crme  Eucerin Original Lotion  Eucerin Plus Crme Eucerin Plus Lotion  Eucerin TriLipid Replenishing Lotion  Keri Anti-Bacterial Hand Lotion  Keri Deep Conditioning Original Lotion Dry Skin Formula Softly Scented  Keri Deep Conditioning Original Lotion, Fragrance Free Sensitive Skin Formula  Keri Lotion Fast Absorbing Fragrance Free Sensitive Skin Formula  Keri Lotion Fast Absorbing Softly Scented Dry Skin Formula  Keri Original Lotion  Keri Skin Renewal Lotion Keri Silky Smooth Lotion  Keri Silky Smooth Sensitive Skin Lotion  Nivea Body Creamy Conditioning Oil  Nivea Body Extra Enriched Lotion  Nivea Body Original Lotion  Nivea Body Sheer Moisturizing Lotion Nivea Crme  Nivea Skin Firming Lotion  NutraDerm 30 Skin Lotion  NutraDerm Skin Lotion  NutraDerm Therapeutic Skin Cream  NutraDerm Therapeutic Skin Lotion  ProShield Protective Hand Cream  Provon moisturizing lotion  Please read over the following fact sheets that you were given.

## 2024-03-19 ENCOUNTER — Encounter (HOSPITAL_COMMUNITY)
Admission: RE | Admit: 2024-03-19 | Discharge: 2024-03-19 | Disposition: A | Discharge status: Home / Self Care | Source: Ambulatory Visit | Attending: Neurosurgery | Admitting: Neurosurgery

## 2024-03-19 ENCOUNTER — Encounter (HOSPITAL_COMMUNITY): Payer: Self-pay

## 2024-03-19 ENCOUNTER — Other Ambulatory Visit: Payer: Self-pay

## 2024-03-19 VITALS — BP 112/86 | HR 93 | Temp 98.2°F | Resp 16 | Ht 65.0 in | Wt 274.0 lb

## 2024-03-19 DIAGNOSIS — F17210 Nicotine dependence, cigarettes, uncomplicated: Secondary | ICD-10-CM | POA: Insufficient documentation

## 2024-03-19 DIAGNOSIS — K449 Diaphragmatic hernia without obstruction or gangrene: Secondary | ICD-10-CM | POA: Diagnosis not present

## 2024-03-19 DIAGNOSIS — Z981 Arthrodesis status: Secondary | ICD-10-CM | POA: Insufficient documentation

## 2024-03-19 DIAGNOSIS — K219 Gastro-esophageal reflux disease without esophagitis: Secondary | ICD-10-CM | POA: Diagnosis not present

## 2024-03-19 DIAGNOSIS — E1140 Type 2 diabetes mellitus with diabetic neuropathy, unspecified: Secondary | ICD-10-CM | POA: Diagnosis not present

## 2024-03-19 DIAGNOSIS — K279 Peptic ulcer, site unspecified, unspecified as acute or chronic, without hemorrhage or perforation: Secondary | ICD-10-CM | POA: Insufficient documentation

## 2024-03-19 DIAGNOSIS — M5416 Radiculopathy, lumbar region: Secondary | ICD-10-CM | POA: Diagnosis not present

## 2024-03-19 DIAGNOSIS — Z01812 Encounter for preprocedural laboratory examination: Secondary | ICD-10-CM | POA: Insufficient documentation

## 2024-03-19 DIAGNOSIS — I10 Essential (primary) hypertension: Secondary | ICD-10-CM | POA: Diagnosis not present

## 2024-03-19 DIAGNOSIS — F419 Anxiety disorder, unspecified: Secondary | ICD-10-CM | POA: Diagnosis not present

## 2024-03-19 DIAGNOSIS — Z7984 Long term (current) use of oral hypoglycemic drugs: Secondary | ICD-10-CM | POA: Insufficient documentation

## 2024-03-19 DIAGNOSIS — Z01818 Encounter for other preprocedural examination: Secondary | ICD-10-CM | POA: Diagnosis present

## 2024-03-19 DIAGNOSIS — Z6841 Body Mass Index (BMI) 40.0 and over, adult: Secondary | ICD-10-CM | POA: Diagnosis not present

## 2024-03-19 LAB — CBC
HCT: 41 % (ref 36.0–46.0)
Hemoglobin: 13.5 g/dL (ref 12.0–15.0)
MCH: 30.1 pg (ref 26.0–34.0)
MCHC: 32.9 g/dL (ref 30.0–36.0)
MCV: 91.3 fL (ref 80.0–100.0)
Platelets: 297 K/uL (ref 150–400)
RBC: 4.49 MIL/uL (ref 3.87–5.11)
RDW: 13.9 % (ref 11.5–15.5)
WBC: 14.4 K/uL — ABNORMAL HIGH (ref 4.0–10.5)
nRBC: 0 % (ref 0.0–0.2)

## 2024-03-19 LAB — GLUCOSE, CAPILLARY: Glucose-Capillary: 157 mg/dL — ABNORMAL HIGH (ref 70–99)

## 2024-03-19 LAB — HEMOGLOBIN A1C
Hgb A1c MFr Bld: 7.2 % — ABNORMAL HIGH (ref 4.8–5.6)
Mean Plasma Glucose: 160 mg/dL

## 2024-03-19 LAB — SURGICAL PCR SCREEN
MRSA, PCR: NEGATIVE
Staphylococcus aureus: NEGATIVE

## 2024-03-19 LAB — BASIC METABOLIC PANEL WITH GFR
Anion gap: 11 (ref 5–15)
BUN: 22 mg/dL — ABNORMAL HIGH (ref 6–20)
CO2: 25 mmol/L (ref 22–32)
Calcium: 9.5 mg/dL (ref 8.9–10.3)
Chloride: 102 mmol/L (ref 98–111)
Creatinine, Ser: 0.73 mg/dL (ref 0.44–1.00)
GFR, Estimated: >60 mL/min (ref >60)
Glucose, Bld: 146 mg/dL — ABNORMAL HIGH (ref 70–99)
Potassium: 3.2 mmol/L — ABNORMAL LOW (ref 3.5–5.1)
Sodium: 138 mmol/L (ref 135–145)

## 2024-03-19 NOTE — Progress Notes (Signed)
 Surgical Instructions     Your procedure is scheduled on March 25, 2024. Report to [facility] Main Entrance A at 10:30 A.M., then check in with the Admitting office. Any questions or running late day of surgery: call [phone]   Questions prior to your surgery date: call [phone], Monday-Friday, 8am-4pm. If you experience any cold or flu symptoms such as cough, fever, chills, shortness of breath, etc. between now and your scheduled surgery, please notify us  at the above number.            Remember:       Do not eat or drink  after midnight the night before your surgery         Take these medicines the morning of surgery with A SIP OF WATER : DULoxetine  (CYMBALTA )  gabapentin  (NEURONTIN )  pantoprazole  (PROTONIX )  rosuvastatin  (CRESTOR )      May take these medicines IF NEEDED: ALPRAZolam  (XANAX )  methocarbamol  (ROBAXIN )  oxyCODONE  (OXY IR/ROXICODONE )      One week prior to surgery, STOP taking any Aspirin (unless otherwise instructed by your surgeon) Aleve , Naproxen , Ibuprofen , Motrin , Advil , Goody's, BC's, all herbal medications, fish oil, and non-prescription vitamins.     WHAT DO I DO ABOUT MY DIABETES MEDICATION?     Do not take metFORMIN  (GLUCOPHAGE -XR) the morning of surgery.     HOW TO MANAGE YOUR DIABETES BEFORE AND AFTER SURGERY   Why is it important to control my blood sugar before and after surgery? Improving blood sugar levels before and after surgery helps healing and can limit problems. A way of improving blood sugar control is eating a healthy diet by:  Eating less sugar and carbohydrates  Increasing activity/exercise  Talking with your doctor about reaching your blood sugar goals High blood sugars (greater than 180 mg/dL) can raise your risk of infections and slow your recovery, so you will need to focus on controlling your diabetes during the weeks before surgery. Make sure that the doctor who takes care of your diabetes knows about your planned  surgery including the date and location.   How do I manage my blood sugar before surgery? Check your blood sugar at least 4 times a day, starting 2 days before surgery, to make sure that the level is not too high or low.   Check your blood sugar the morning of your surgery when you wake up and every 2 hours until you get to the Short Stay unit.   If your blood sugar is less than 70 mg/dL, you will need to treat for low blood sugar: Do not take insulin . Treat a low blood sugar (less than 70 mg/dL) with  cup of clear juice (cranberry or apple), 4 glucose tablets, OR glucose gel. Recheck blood sugar in 15 minutes after treatment (to make sure it is greater than 70 mg/dL). If your blood sugar is not greater than 70 mg/dL on recheck, call [phone] for further instructions. Report your blood sugar to the short stay nurse when you get to Short Stay.   If you are admitted to the hospital after surgery: Your blood sugar will be checked by the staff and you will probably be given insulin  after surgery (instead of oral diabetes medicines) to make sure you have good blood sugar levels. The goal for blood sugar control after surgery is 80-180 mg/dL.                       Do NOT Smoke (Tobacco/Vaping) for 24 hours prior  to your procedure.   If you use a CPAP at night, you may bring your mask/headgear for your overnight stay.   You will be asked to remove any contacts, glasses, piercing's, hearing aid's, dentures/partials prior to surgery. Please bring cases for these items if needed.    Patients discharged the day of surgery will not be allowed to drive home, and someone needs to stay with them for 24 hours.   SURGICAL WAITING ROOM VISITATION Patients may have no more than 2 support people in the waiting area - these visitors may rotate.   Pre-op nurse will coordinate an appropriate time for 1 ADULT support person, who may not rotate, to accompany patient in pre-op.  Children under the age of 21  must have an adult with them who is not the patient and must remain in the main waiting area with an adult.   If the patient needs to stay at the hospital during part of their recovery, the visitor guidelines for inpatient rooms apply.   Please refer to the [facility] website for the visitor guidelines for any additional information.     If you received a COVID test during your pre-op visit  it is requested that you wear a mask when out in public, stay away from anyone that may not be feeling well and notify your surgeon if you develop symptoms. If you have been in contact with anyone that has tested positive in the last 10 days please notify you surgeon.         Pre-operative 5 CHG Bathing Instructions    You can play a key role in reducing the risk of infection after surgery. Your skin needs to be as free of germs as possible. You can reduce the number of germs on your skin by washing with CHG (chlorhexidine  gluconate) soap before surgery. CHG is an antiseptic soap that kills germs and continues to kill germs even after washing.    DO NOT use if you have an allergy to chlorhexidine /CHG or antibacterial soaps. If your skin becomes reddened or irritated, stop using the CHG and notify one of our RNs at [phone].    Please shower with the CHG soap starting 4 days before surgery using the following schedule:       Please keep in mind the following:  DO NOT shave, including legs and underarms, starting the day of your first shower.   You may shave your face at any point before/day of surgery.  Place clean sheets on your bed the day you start using CHG soap. Use a clean washcloth (not used since being washed) for each shower. DO NOT sleep with pets once you start using the CHG.    CHG Shower Instructions:  Wash your face and private area with normal soap. If you choose to wash your hair, wash first with your normal shampoo.  After you use shampoo/soap, rinse your hair and body  thoroughly to remove shampoo/soap residue.  Turn the water  OFF and apply about 3 tablespoons (45 ml) of CHG soap to a CLEAN washcloth.  Apply CHG soap ONLY FROM YOUR NECK DOWN TO YOUR TOES (washing for 3-5 minutes)  DO NOT use CHG soap on face, private areas, open wounds, or sores.  Pay special attention to the area where your surgery is being performed.  If you are having back surgery, having someone wash your back for you may be helpful. Wait 2 minutes after CHG soap is applied, then you may rinse off the  CHG soap.  Pat dry with a clean towel  Put on clean clothes/pajamas   If you choose to wear lotion, please use ONLY the CHG-compatible lotions that are listed below.   Additional instructions for the day of surgery: DO NOT APPLY any lotions, deodorants, cologne, or perfumes.   Do not bring valuables to the hospital. [facility] is not responsible for any belongings/valuables. Do not wear nail polish, gel polish, artificial nails, or any other type of covering on natural nails (fingers and toes) Do not wear jewelry or makeup Put on clean/comfortable clothes.  Please brush your teeth.  Ask your nurse before applying any prescription medications to the skin.        CHG Compatible Lotions    Aveeno Moisturizing lotion  Cetaphil Moisturizing Cream  Cetaphil Moisturizing Lotion  Clairol Herbal Essence Moisturizing Lotion, Dry Skin  Clairol Herbal Essence Moisturizing Lotion, Extra Dry Skin  Clairol Herbal Essence Moisturizing Lotion, Normal Skin  Curel Age Defying Therapeutic Moisturizing Lotion with Alpha Hydroxy  Curel Extreme Care Body Lotion  Curel Soothing Hands Moisturizing Hand Lotion  Curel Therapeutic Moisturizing Cream, Fragrance-Free  Curel Therapeutic Moisturizing Lotion, Fragrance-Free  Curel Therapeutic Moisturizing Lotion, Original Formula  Eucerin Daily Replenishing Lotion  Eucerin Dry Skin Therapy Plus Alpha Hydroxy Crme  Eucerin Dry Skin Therapy Plus Alpha  Hydroxy Lotion  Eucerin Original Crme  Eucerin Original Lotion  Eucerin Plus Crme Eucerin Plus Lotion  Eucerin TriLipid Replenishing Lotion  Keri Anti-Bacterial Hand Lotion  Keri Deep Conditioning Original Lotion Dry Skin Formula Softly Scented  Keri Deep Conditioning Original Lotion, Fragrance Free Sensitive Skin Formula  Keri Lotion Fast Absorbing Fragrance Free Sensitive Skin Formula  Keri Lotion Fast Absorbing Softly Scented Dry Skin Formula  Keri Original Lotion  Keri Skin Renewal Lotion Keri Silky Smooth Lotion  Keri Silky Smooth Sensitive Skin Lotion  Nivea Body Creamy Conditioning Oil  Nivea Body Extra Enriched Lotion  Nivea Body Original Lotion  Nivea Body Sheer Moisturizing Lotion Nivea Crme  Nivea Skin Firming Lotion  NutraDerm 30 Skin Lotion  NutraDerm Skin Lotion  NutraDerm Therapeutic Skin Cream  NutraDerm Therapeutic Skin Lotion  ProShield Protective Hand Cream  Provon moisturizing lotion   Please read over the following fact sheets that you were given

## 2024-03-19 NOTE — Progress Notes (Signed)
 PCP - Tobie Suzzane POUR, MD  Cardiologist -   PPM/ICD - denies Device Orders - n/a Rep Notified - n/a  Chest x-ray - denies EKG -11-06-23  Stress Test - denies ECHO - 01-31-23 Cardiac Cath - denies  Sleep Study - denies CPAP - n/a  Fasting Blood Sugar - Per patient blood sugar ranges 157-165. Blood sugar at PAT appointment 157 Checks Blood Sugar TID  Last dose of GLP1 agonist-   GLP1 instructions:   Blood Thinner Instructions: denies Aspirin Instructions:n/a  ERAS Protcol - NPO  COVID TEST- n/a  Anesthesia review: yes   Patient denies shortness of breath, fever, cough and chest pain at PAT appointment   All instructions explained to the patient, with a verbal understanding of the material. Patient agrees to go over the instructions while at home for a better understanding. Patient also instructed to self quarantine after being tested for COVID-19. The opportunity to ask questions was provided.

## 2024-03-20 NOTE — Progress Notes (Signed)
 Anesthesia Chart Review:  Case: [id number] Date/Time: 03/25/24 1215   Procedure: MINIMALLY INVASIVE (MIS) TRANSFORAMINAL LUMBAR INTERBODY FUSION (TLIF) 1 LEVEL - LT L45 MIS TLIF   Anesthesia type: General   Diagnosis: Lumbar radiculopathy [M54.16]   Pre-op diagnosis: LUMBAR RADICULOPATHY   [facility] / [facility]   Surgeons: Debby Dorn MATSU, MD       DISCUSSION: Patient is a 53 year old female scheduled for the above procedure.  History includes smoker, HTN, hiatal hernia, GERD, PUD, NIDDM2, neuropathy, anxiety, spinal surgery (Left L4-5 laminectomy/discectomy 03/11/2000; C6-7 ACDF 03/12/2007; C3-6 ACDF 12/03/2023).  BMI is consistent with morbid obesity.   She had prior cardiology evaluation in 2020 by Alvan Dorn, MD for palpitations. Event monitor showed primarily SR (53-152, average 94 bpm), rare PACs and PVCs, some atrial runs up to 140.  She had an unremarkable echo in 2021. Most recent echo in 01/2023 showed EF 60-65%, normal RV function, no significant valvular abnormalities.   PCP clearance from Dr. Suzzane Blanch dated 88/18/2025 as above patient is moderate risk (scanned under Media tab).  A1c 7.2% on 03/19/2024.   Anesthesia team to evaluate on the day of surgery. She will need repeat T&S on the day of surgery due to antibodies.   VS: BP 112/86   Pulse 93   Temp 36.8 C   Resp 16   Ht 5' 5 (1.651 m)   Wt 124.3 kg   LMP  (LMP Unknown)   SpO2 97%   BMI 45.60 kg/m    PROVIDERS: Blanch Suzzane POUR, MD is PCP  Cindie Dunnings, DO is GI   LABS: Preoperative labs noted. LFTs normal on 01/15/2024.  (all labs ordered are listed, but only abnormal results are displayed)  Labs Reviewed  GLUCOSE, CAPILLARY - Abnormal; Notable for the following components:      Result Value   Glucose-Capillary 157 (*)    All other components within normal limits  HEMOGLOBIN A1C - Abnormal; Notable for the following components:   Hgb A1c MFr Bld 7.2 (*)    All other components within  normal limits  BASIC METABOLIC PANEL WITH GFR - Abnormal; Notable for the following components:   Potassium 3.2 (*)    Glucose, Bld 146 (*)    BUN 22 (*)    All other components within normal limits  CBC - Abnormal; Notable for the following components:   WBC 14.4 (*)    All other components within normal limits  SURGICAL PCR SCREEN  TYPE AND SCREEN     IMAGES: MRI L-spine 02/23/2024: IMPRESSION: 1. Advanced chronic lower lumbar degeneration with chronic vacuum L3-L4 through L5-S1. With regard to left lower extremity radiating pain the symptomatic level(s) might be: - L3-L4 where severe left foraminal stenosis is related to bulky chronic left foraminal and far lateral disc herniation (series 106, image 23). Query left L3 radiculitis. Moderate multifactorial spinal stenosis at this level appears stable. - L4-L5 where moderate to severe left lateral recess is related to caudal migrated disc material. Query left L5 radiculitis. Moderate biforaminal stenosis here is stable, L4 nerve levels. And no spinal stenosis at this level following laminectomy. 2. Chronic L5-S1 degeneration with moderate right lateral recess stenosis, moderate to severe left greater than right foraminal stenosis. 3. Moderate L2-L3 spinal stenosis appears progressed from the noncontrast MRI earlier this year, perhaps related to increased mild epidural lipomatosis. Chronic asymmetric and bulky right foraminal disc with moderate to severe right L2 neural foraminal stenosis. 4. L1-L2 mild spinal stenosis  just below the tip of the conus medullaris is stable, although up to moderate left lateral recess stenosis there appears progressed, descending left L2 nerve level.    CT soft tissue neck 12/30/2023: IMPRESSION: 1. Acute tonsillitis with a 2 cm left peritonsillar abscess. 2. Suspected acute left submandibular sialoadenitis. 3. Bilateral mastoid and left middle ear effusions.    EKG: 11/06/2023: Sinus Rhythm.  Rate 97. Nonspecific T-abnormality.    CV:  TTE 01/31/23: 1. Left ventricular ejection fraction, by estimation, is 60 to 65%. The  left ventricle has normal function. The left ventricle has no regional  wall motion abnormalities. Left ventricular diastolic parameters were  normal.   2. Right ventricular systolic function is normal. The right ventricular  size is normal.   3. The mitral valve is grossly normal. No evidence of mitral valve  regurgitation.   4. The aortic valve is tricuspid. Aortic valve regurgitation is not  visualized. No aortic stenosis is present.   5. The inferior vena cava is normal in size with greater than 50%  respiratory variability, suggesting right atrial pressure of 3 mmHg.  - Comparison(s): No significant change from prior study.    Cardiac Event Monitor 07/15/2019: 14 day event monitor Min HR 53, Max HR 152, Avg HR 94 Rare supareventriclar ectopy overall, primarily in the form of isolated PACs. Did have some runs of atrial tachycardia up to 140 beats Rare ventricular ectopy, primaily in the form of isolated PVCs.    Past Medical History:  Diagnosis Date   Anxiety    Arthritis    Chronic pain    DDD (degenerative disc disease)    Depression    Diabetes mellitus without complication (HCC)    GERD (gastroesophageal reflux disease)    HBP (high blood pressure)    Headache    Hernia    History of hiatal hernia    Multiple gastric ulcers    Neuromuscular disorder (HCC)    Neuropathy bilateral feet and hands   Rotator cuff syndrome of left shoulder 10/09/2011   Ulcer disease     Past Surgical History:  Procedure Laterality Date   ANTERIOR CERVICAL DECOMP/DISCECTOMY FUSION N/A 12/03/2023   Procedure: ANTERIOR CERVICAL DECOMPRESSION/DISCECTOMY FUSION CERVICAL THREE-FOUR CERVICAL FOUR-FIVE CERVICAL FIVE-SIX EXPLORE FUSION SIX-SEVEN;  Surgeon: Debby Dorn MATSU, MD;  Location: [facility] OR;  Service: Neurosurgery;  Laterality: N/A;  3C   APPENDECTOMY      BACK SURGERY     x5   BIOPSY  05/04/2021   Procedure: BIOPSY;  Surgeon: Cindie Carlin POUR, DO;  Location: [facility] ENDO SUITE;  Service: Endoscopy;;   CARPAL TUNNEL RELEASE     bilateral   CHOLECYSTECTOMY     COLONOSCOPY  10/20/2009   MFM:qmpjaoz anal canal/left-side diverticula and multiple polyps (hyperplastic). poor prep compromised exam. Next TCS 09/2014.   COLONOSCOPY WITH PROPOFOL  N/A 05/04/2021   Procedure: COLONOSCOPY WITH PROPOFOL ;  Surgeon: Cindie Carlin POUR, DO;  [facility]: [facility];  Service: Endoscopy;  Laterality: N/A;  9:45am   ESOPHAGOGASTRODUODENOSCOPY  03/06/2010   MFM:qnlm quadrant distal esophageal/patent tubular esophagus/small HH, antral erosions and ulcerations. Bx negative for persistent H.pylori   ESOPHAGOGASTRODUODENOSCOPY  10/20/2009   MFM:dfjoo HH/prepyloric antral ulcer s/p bx (+h.pylori), ERE   ESOPHAGOGASTRODUODENOSCOPY (EGD) WITH PROPOFOL  N/A 05/04/2021   Procedure: ESOPHAGOGASTRODUODENOSCOPY (EGD) WITH PROPOFOL ;  Surgeon: Cindie Carlin POUR, DO;  [facility];  Service: Endoscopy;  Laterality: N/A;   INTRAUTERINE DEVICE INSERTION     LEEP N/A 02/14/2021   Procedure: LOOP ELECTROSURGICAL  EXCISION PROCEDURE (LEEP);  Surgeon: Ozan, Jennifer, DO;  Location: [facility] ORS;  Service: Gynecology;  Laterality: N/A;   MASTECTOMY, PARTIAL Left    x 2   MOUTH SURGERY     All top teeth removed and 6-8 of bottom teeth removed   POLYPECTOMY  05/04/2021   Procedure: POLYPECTOMY;  Surgeon: Cindie Carlin POUR, DO;  [facility]: [facility];  Service: Endoscopy;;   ROTATOR CUFF REPAIR     right   TUBAL LIGATION      MEDICATIONS:  ALPRAZolam  (XANAX ) 0.5 MG tablet   Aspirin-Acetaminophen -Caffeine (GOODYS EXTRA STRENGTH PO)   DULoxetine  (CYMBALTA ) 30 MG capsule   gabapentin  (NEURONTIN ) 300 MG capsule   Glucose Blood (BLOOD GLUCOSE TEST STRIPS) STRP   hydrochlorothiazide  (HYDRODIURIL ) 25 MG tablet   metFORMIN  (GLUCOPHAGE -XR) 500 MG 24 hr tablet   methocarbamol   (ROBAXIN ) 500 MG tablet   oxyCODONE -acetaminophen  (PERCOCET/ROXICET) 5-325 MG tablet   pantoprazole  (PROTONIX ) 40 MG tablet   rosuvastatin  (CRESTOR ) 5 MG tablet   No current facility-administered medications for this encounter.    Isaiah Ruder, PA-C Surgical Short Stay/Anesthesiology [facility] Phone [phone] [facility] Phone [phone] 03/20/2024 6:56 PM

## 2024-03-20 NOTE — Progress Notes (Signed)
 Patient will need a repeat type and screen on the day of surgery due to antibodies.

## 2024-03-24 NOTE — Anesthesia Preprocedure Evaluation (Signed)
 Anesthesia Evaluation  Patient identified by MRN, date of birth, ID band Patient awake    Reviewed: Allergy & Precautions, NPO status , Patient's Chart, lab work & pertinent test results  History of Anesthesia Complications Negative for: history of anesthetic complications  Airway Mallampati: IV  TM Distance: >3 FB Neck ROM: Limited    Dental  (+) Dental Advisory Given, Edentulous Upper, Partial Lower   Pulmonary COPD, neg recent URI, Current SmokerPatient did not abstain from smoking.   Pulmonary exam normal breath sounds clear to auscultation       Cardiovascular hypertension, Pt. on medications (-) angina  Rhythm:Regular Rate:Normal  '24 ECHO: EF 60-65%, normal LVF, normal RVF, no significant valvular abnormalities   Neuro/Psych  Headaches PSYCHIATRIC DISORDERS Anxiety Depression     Neuromuscular disease    GI/Hepatic Neg liver ROS, hiatal hernia, PUD,GERD  Medicated and Controlled,,  Endo/Other  diabetes, Oral Hypoglycemic Agents  Class 3 obesityBMI 43  Renal/GU negative Renal ROS     Musculoskeletal  (+) Arthritis ,    Abdominal  (+) + obese  Peds  Hematology   Anesthesia Other Findings   Reproductive/Obstetrics                              Anesthesia Physical Anesthesia Plan  ASA: 3  Anesthesia Plan: General   Post-op Pain Management: Tylenol  PO (pre-op)* and Gabapentin  PO (pre-op)*   Induction: Intravenous  PONV Risk Score and Plan: 2 and 3 and Dexamethasone , Ondansetron , Treatment may vary due to age or medical condition and Midazolam   Airway Management Planned: Oral ETT and Video Laryngoscope Planned  Additional Equipment: None  Intra-op Plan:   Post-operative Plan: Extubation in OR  Informed Consent: I have reviewed the patients History and Physical, chart, labs and discussed the procedure including the risks, benefits and alternatives for the proposed anesthesia  with the patient or authorized representative who has indicated his/her understanding and acceptance.     Dental advisory given  Plan Discussed with: CRNA  Anesthesia Plan Comments: (Risks of anesthesia explained at length. This includes, but is not limited to, sore throat, damage to teeth, lips gums, tongue and vocal cords, nausea and vomiting, reactions to medications, stroke, heart attack, and death. All patient questions were answered and the patient wishes to proceed.    PAT note by Lynwood Hope, PA-C: 53 yo female with pertinent hx including current smoker, HTN, hiatal hernia, PUD,  GERD on PPI, NIDDM2,   Pt had prior cardiology eval in 2020 for c/o palpitations. Event monitor showed primarily sinus rhythm ranging from sinus bradycardia to sinus tachycardia.  No evidence is SVT, atrial fibrillation, pauses, ventricular tachycardia, heart blocks. Did have some PVCs. PRN followup recommended. Recent echo 01/2023 showed EF 60-65%, normal RV function, no significant valvular abnormalities.  Hx of prior C6-7 ACDF. Cervical MRI 01/2023 showed disc protrusion at C3-4 with flattening of the ventral cord. Additional disc protrusions with cord contact at C2-3 and C4-5.  No formal diagnosis of OSA but BMI of 43 and STOP-BANG score of 5 raises concern.  Pt reports she does not have a glucometer and does not track BG. CBG at PAT was 229. Last A1c 6.4 on 11/06/23.  PCP clearance from Dr. Suzzane Blanch dated 11/06/23 states pt is moderate risk.   CMP and CBC 11/06/23 reviewed, unremarkable.   EKG 11/06/23: Sinus Rhythm. Rate 97. Nonspecific T-abnormality.  TTE 01/31/23: 1. Left ventricular ejection fraction, by estimation, is  60 to 65%. The  left ventricle has normal function. The left ventricle has no regional  wall motion abnormalities. Left ventricular diastolic parameters were  normal.  2. Right ventricular systolic function is normal. The right ventricular  size is normal.  3. The mitral  valve is grossly normal. No evidence of mitral valve  regurgitation.  4. The aortic valve is tricuspid. Aortic valve regurgitation is not  visualized. No aortic stenosis is present.  5. The inferior vena cava is normal in size with greater than 50%  respiratory variability, suggesting right atrial pressure of 3 mmHg.   Comparison(s): No significant change from prior study.   )         Anesthesia Quick Evaluation

## 2024-03-25 ENCOUNTER — Observation Stay (HOSPITAL_COMMUNITY)
Admission: RE | Admit: 2024-03-25 | Discharge: 2024-03-26 | Disposition: A | Discharge status: Home / Self Care | Attending: Neurosurgery | Admitting: Neurosurgery

## 2024-03-25 ENCOUNTER — Other Ambulatory Visit: Payer: Self-pay

## 2024-03-25 ENCOUNTER — Ambulatory Visit (HOSPITAL_COMMUNITY): Payer: Self-pay | Admitting: Vascular Surgery

## 2024-03-25 ENCOUNTER — Ambulatory Visit (HOSPITAL_COMMUNITY)

## 2024-03-25 ENCOUNTER — Encounter (HOSPITAL_COMMUNITY): Payer: Self-pay

## 2024-03-25 ENCOUNTER — Ambulatory Visit (HOSPITAL_COMMUNITY): Payer: Self-pay | Admitting: Anesthesiology

## 2024-03-25 ENCOUNTER — Encounter (HOSPITAL_COMMUNITY)
Admission: RE | Disposition: A | Discharge status: Home / Self Care | Payer: Self-pay | Source: Home / Self Care | Attending: Neurosurgery

## 2024-03-25 DIAGNOSIS — I10 Essential (primary) hypertension: Secondary | ICD-10-CM

## 2024-03-25 DIAGNOSIS — J449 Chronic obstructive pulmonary disease, unspecified: Secondary | ICD-10-CM | POA: Insufficient documentation

## 2024-03-25 DIAGNOSIS — E66813 Obesity, class 3: Secondary | ICD-10-CM | POA: Diagnosis not present

## 2024-03-25 DIAGNOSIS — F17210 Nicotine dependence, cigarettes, uncomplicated: Secondary | ICD-10-CM | POA: Insufficient documentation

## 2024-03-25 DIAGNOSIS — M5416 Radiculopathy, lumbar region: Secondary | ICD-10-CM | POA: Diagnosis not present

## 2024-03-25 DIAGNOSIS — F331 Major depressive disorder, recurrent, moderate: Secondary | ICD-10-CM | POA: Insufficient documentation

## 2024-03-25 DIAGNOSIS — F418 Other specified anxiety disorders: Secondary | ICD-10-CM | POA: Diagnosis not present

## 2024-03-25 DIAGNOSIS — Z79899 Other long term (current) drug therapy: Secondary | ICD-10-CM | POA: Diagnosis not present

## 2024-03-25 DIAGNOSIS — Z7982 Long term (current) use of aspirin: Secondary | ICD-10-CM | POA: Insufficient documentation

## 2024-03-25 DIAGNOSIS — Z6841 Body Mass Index (BMI) 40.0 and over, adult: Secondary | ICD-10-CM | POA: Diagnosis not present

## 2024-03-25 DIAGNOSIS — E119 Type 2 diabetes mellitus without complications: Secondary | ICD-10-CM | POA: Insufficient documentation

## 2024-03-25 DIAGNOSIS — Z01818 Encounter for other preprocedural examination: Principal | ICD-10-CM

## 2024-03-25 DIAGNOSIS — M21372 Foot drop, left foot: Secondary | ICD-10-CM | POA: Diagnosis present

## 2024-03-25 HISTORY — PX: TRANSFORAMINAL LUMBAR INTERBODY FUSION W/ MIS 1 LEVEL: SHX6145

## 2024-03-25 LAB — TYPE AND SCREEN
ABO/RH(D): O NEG
Antibody Screen: NEGATIVE

## 2024-03-25 LAB — GLUCOSE, CAPILLARY
Glucose-Capillary: 164 mg/dL — ABNORMAL HIGH (ref 70–99)
Glucose-Capillary: 159 mg/dL — ABNORMAL HIGH (ref 70–99)
Glucose-Capillary: 179 mg/dL — ABNORMAL HIGH (ref 70–99)
Glucose-Capillary: 231 mg/dL — ABNORMAL HIGH (ref 70–99)

## 2024-03-25 SURGERY — MINIMALLY INVASIVE (MIS) TRANSFORAMINAL LUMBAR INTERBODY FUSION (TLIF) 1 LEVEL
Anesthesia: General

## 2024-03-25 MED ORDER — ONDANSETRON HCL 4 MG/2ML IJ SOLN: 4.0000 mg | Freq: Four times a day (QID) | INTRAMUSCULAR | Status: DC | PRN

## 2024-03-25 MED ORDER — HYDROMORPHONE HCL 1 MG/ML IJ SOLN
0.2500 mg | INTRAMUSCULAR | Status: DC | PRN
  Administered 2024-03-25 (×2): 0.5 mg via INTRAVENOUS

## 2024-03-25 MED ORDER — ONDANSETRON HCL 4 MG/2ML IJ SOLN
INTRAMUSCULAR | Status: AC
  Filled 2024-03-25: qty 2

## 2024-03-25 MED ORDER — HYDROMORPHONE HCL 1 MG/ML IJ SOLN
INTRAMUSCULAR | Status: AC
  Filled 2024-03-25: qty 0.5

## 2024-03-25 MED ORDER — PHENYLEPHRINE 80 MCG/ML (10ML) SYRINGE FOR IV PUSH (FOR BLOOD PRESSURE SUPPORT)
PREFILLED_SYRINGE | INTRAVENOUS | Status: DC | PRN
Start: 2024-03-25 — End: 2024-03-25
  Administered 2024-03-25 (×2): 80 ug via INTRAVENOUS

## 2024-03-25 MED ORDER — PANTOPRAZOLE SODIUM 40 MG PO TBEC: 40.0000 mg | DELAYED_RELEASE_TABLET | Freq: Two times a day (BID) | ORAL | Status: DC | PRN

## 2024-03-25 MED ORDER — KETAMINE HCL 10 MG/ML IJ SOLN
INTRAMUSCULAR | Status: DC | PRN
Start: 2024-03-25 — End: 2024-03-25
  Administered 2024-03-25 (×2): 20 mg via INTRAVENOUS
  Administered 2024-03-25: 10 mg via INTRAVENOUS

## 2024-03-25 MED ORDER — FENTANYL CITRATE PF 50 MCG/ML IJ SOSY
50.0000 ug | PREFILLED_SYRINGE | Freq: Once | INTRAMUSCULAR | Status: AC
  Administered 2024-03-25: 50 ug via INTRAVENOUS

## 2024-03-25 MED ORDER — POTASSIUM CHLORIDE IN NACL 20-0.9 MEQ/L-% IV SOLN
INTRAVENOUS | Status: DC
  Filled 2024-03-25: qty 1000

## 2024-03-25 MED ORDER — BUPIVACAINE LIPOSOME 1.3 % IJ SUSP
INTRAMUSCULAR | Status: DC | PRN
  Administered 2024-03-25: 20 mL

## 2024-03-25 MED ORDER — CHLORHEXIDINE GLUCONATE 0.12 % MT SOLN
OROMUCOSAL | Status: AC
  Administered 2024-03-25: 15 mL via OROMUCOSAL
  Filled 2024-03-25: qty 15

## 2024-03-25 MED ORDER — CHLORHEXIDINE GLUCONATE CLOTH 2 % EX PADS: 6.0000 | MEDICATED_PAD | Freq: Once | CUTANEOUS | Status: DC

## 2024-03-25 MED ORDER — ROCURONIUM BROMIDE 10 MG/ML (PF) SYRINGE
PREFILLED_SYRINGE | INTRAVENOUS | Status: DC | PRN
  Administered 2024-03-25: 70 mg via INTRAVENOUS
  Administered 2024-03-25 (×3): 10 mg via INTRAVENOUS

## 2024-03-25 MED ORDER — ROCURONIUM BROMIDE 10 MG/ML (PF) SYRINGE
PREFILLED_SYRINGE | INTRAVENOUS | Status: AC
  Filled 2024-03-25: qty 10

## 2024-03-25 MED ORDER — INSULIN ASPART 100 UNIT/ML IJ SOLN
0.0000 [IU] | Freq: Three times a day (TID) | INTRAMUSCULAR | Status: DC
  Administered 2024-03-26: 2 [IU] via SUBCUTANEOUS

## 2024-03-25 MED ORDER — INSULIN ASPART 100 UNIT/ML IJ SOLN
0.0000 [IU] | INTRAMUSCULAR | Status: DC | PRN
  Administered 2024-03-25: 2 [IU] via SUBCUTANEOUS
  Filled 2024-03-25: qty 1

## 2024-03-25 MED ORDER — KETAMINE HCL 50 MG/5ML IJ SOSY
PREFILLED_SYRINGE | INTRAMUSCULAR | Status: AC
  Filled 2024-03-25: qty 5

## 2024-03-25 MED ORDER — ORAL CARE MOUTH RINSE: 15.0000 mL | Freq: Once | OROMUCOSAL | Status: AC

## 2024-03-25 MED ORDER — DULOXETINE HCL 30 MG PO CPEP
30.0000 mg | ORAL_CAPSULE | Freq: Every day | ORAL | Status: DC
  Administered 2024-03-26: 30 mg via ORAL
  Filled 2024-03-25: qty 1

## 2024-03-25 MED ORDER — POLYETHYLENE GLYCOL 3350 17 G PO PACK: 17.0000 g | PACK | Freq: Every day | ORAL | Status: DC | PRN

## 2024-03-25 MED ORDER — DOCUSATE SODIUM 100 MG PO CAPS
100.0000 mg | ORAL_CAPSULE | Freq: Two times a day (BID) | ORAL | Status: DC
  Administered 2024-03-25 – 2024-03-26 (×2): 100 mg via ORAL
  Filled 2024-03-25 (×2): qty 1

## 2024-03-25 MED ORDER — ROSUVASTATIN CALCIUM 5 MG PO TABS
5.0000 mg | ORAL_TABLET | Freq: Every day | ORAL | Status: DC
  Administered 2024-03-26: 5 mg via ORAL
  Filled 2024-03-25: qty 1

## 2024-03-25 MED ORDER — BUPIVACAINE HCL (PF) 0.5 % IJ SOLN
INTRAMUSCULAR | Status: AC
  Filled 2024-03-25: qty 30

## 2024-03-25 MED ORDER — BUPIVACAINE HCL (PF) 0.5 % IJ SOLN
INTRAMUSCULAR | Status: DC | PRN
  Administered 2024-03-25: 30 mL

## 2024-03-25 MED ORDER — LIDOCAINE-EPINEPHRINE 1 %-1:100000 IJ SOLN
INTRAMUSCULAR | Status: DC | PRN
  Administered 2024-03-25: 6 mL
  Administered 2024-03-25: 10 mL

## 2024-03-25 MED ORDER — SODIUM CHLORIDE 0.9 % IV SOLN: 250.0000 mL | INTRAVENOUS | Status: DC

## 2024-03-25 MED ORDER — ONDANSETRON HCL 4 MG/2ML IJ SOLN
INTRAMUSCULAR | Status: DC | PRN
  Administered 2024-03-25: 4 mg via INTRAVENOUS

## 2024-03-25 MED ORDER — THROMBIN 5000 UNITS EX SOLR
OROMUCOSAL | Status: DC | PRN
  Administered 2024-03-25: 5 mL via TOPICAL

## 2024-03-25 MED ORDER — VANCOMYCIN HCL 1500 MG/300ML IV SOLN
INTRAVENOUS | Status: AC
  Administered 2024-03-25: 1500 mg via INTRAVENOUS
  Filled 2024-03-25: qty 300

## 2024-03-25 MED ORDER — DEXMEDETOMIDINE HCL IN NACL 80 MCG/20ML IV SOLN
INTRAVENOUS | Status: DC | PRN
Start: 2024-03-25 — End: 2024-03-25
  Administered 2024-03-25 (×3): 8 ug via INTRAVENOUS

## 2024-03-25 MED ORDER — GABAPENTIN 300 MG PO CAPS
300.0000 mg | ORAL_CAPSULE | Freq: Three times a day (TID) | ORAL | Status: DC
  Administered 2024-03-25 – 2024-03-26 (×2): 300 mg via ORAL
  Filled 2024-03-25 (×2): qty 1

## 2024-03-25 MED ORDER — ACETAMINOPHEN 650 MG RE SUPP: 650.0000 mg | RECTAL | Status: DC | PRN

## 2024-03-25 MED ORDER — HYDROMORPHONE HCL 1 MG/ML IJ SOLN
INTRAMUSCULAR | Status: DC | PRN
  Administered 2024-03-25: .5 mg via INTRAVENOUS

## 2024-03-25 MED ORDER — METHOCARBAMOL 500 MG PO TABS
500.0000 mg | ORAL_TABLET | Freq: Four times a day (QID) | ORAL | Status: DC | PRN
  Administered 2024-03-25 – 2024-03-26 (×3): 500 mg via ORAL
  Filled 2024-03-25 (×3): qty 1

## 2024-03-25 MED ORDER — LIDOCAINE 2% (20 MG/ML) 5 ML SYRINGE
INTRAMUSCULAR | Status: DC | PRN
  Administered 2024-03-25: 100 mg via INTRAVENOUS

## 2024-03-25 MED ORDER — SODIUM CHLORIDE 0.9% FLUSH: 3.0000 mL | INTRAVENOUS | Status: DC | PRN

## 2024-03-25 MED ORDER — 0.9 % SODIUM CHLORIDE (POUR BTL) OPTIME
TOPICAL | Status: DC | PRN
  Administered 2024-03-25: 1000 mL

## 2024-03-25 MED ORDER — PHENYLEPHRINE HCL-NACL 20-0.9 MG/250ML-% IV SOLN
INTRAVENOUS | Status: DC | PRN
  Administered 2024-03-25: 25 ug/min via INTRAVENOUS

## 2024-03-25 MED ORDER — FENTANYL CITRATE (PF) 250 MCG/5ML IJ SOLN
INTRAMUSCULAR | Status: DC | PRN
  Administered 2024-03-25: 100 ug via INTRAVENOUS
  Administered 2024-03-25 (×3): 50 ug via INTRAVENOUS

## 2024-03-25 MED ORDER — SODIUM CHLORIDE 0.9% FLUSH
3.0000 mL | Freq: Two times a day (BID) | INTRAVENOUS | Status: DC
  Administered 2024-03-25: 3 mL via INTRAVENOUS

## 2024-03-25 MED ORDER — OXYCODONE HCL 5 MG PO TABS
5.0000 mg | ORAL_TABLET | ORAL | Status: DC | PRN
  Administered 2024-03-25: 5 mg via ORAL
  Filled 2024-03-25: qty 1

## 2024-03-25 MED ORDER — THROMBIN 5000 UNITS EX KIT
PACK | CUTANEOUS | Status: AC
  Filled 2024-03-25: qty 1

## 2024-03-25 MED ORDER — VANCOMYCIN HCL IN DEXTROSE 1-5 GM/200ML-% IV SOLN
1000.0000 mg | Freq: Two times a day (BID) | INTRAVENOUS | Status: DC
  Administered 2024-03-25: 1000 mg via INTRAVENOUS
  Filled 2024-03-25: qty 200

## 2024-03-25 MED ORDER — VANCOMYCIN HCL 1500 MG/300ML IV SOLN: 1500.0000 mg | INTRAVENOUS | Status: AC

## 2024-03-25 MED ORDER — LACTATED RINGERS IV SOLN: INTRAVENOUS | Status: DC

## 2024-03-25 MED ORDER — DEXAMETHASONE SODIUM PHOSPHATE 10 MG/ML IJ SOLN
INTRAMUSCULAR | Status: AC
  Filled 2024-03-25: qty 1

## 2024-03-25 MED ORDER — CHLORHEXIDINE GLUCONATE 0.12 % MT SOLN: 15.0000 mL | Freq: Once | OROMUCOSAL | Status: AC

## 2024-03-25 MED ORDER — MIDAZOLAM HCL 2 MG/2ML IJ SOLN
INTRAMUSCULAR | Status: AC
  Filled 2024-03-25: qty 2

## 2024-03-25 MED ORDER — HYDROCHLOROTHIAZIDE 25 MG PO TABS
25.0000 mg | ORAL_TABLET | Freq: Every day | ORAL | Status: DC
  Administered 2024-03-25 – 2024-03-26 (×2): 25 mg via ORAL
  Filled 2024-03-25 (×2): qty 1

## 2024-03-25 MED ORDER — LIDOCAINE 2% (20 MG/ML) 5 ML SYRINGE
INTRAMUSCULAR | Status: AC
Start: 2024-03-25 — End: 2024-03-25
  Filled 2024-03-25: qty 5

## 2024-03-25 MED ORDER — DROPERIDOL 2.5 MG/ML IJ SOLN: 0.6250 mg | Freq: Once | INTRAMUSCULAR | Status: DC | PRN

## 2024-03-25 MED ORDER — PROPOFOL 10 MG/ML IV BOLUS
INTRAVENOUS | Status: DC | PRN
Start: 2024-03-25 — End: 2024-03-25
  Administered 2024-03-25: 160 mg via INTRAVENOUS

## 2024-03-25 MED ORDER — LIDOCAINE-EPINEPHRINE 1 %-1:100000 IJ SOLN
INTRAMUSCULAR | Status: AC
  Filled 2024-03-25: qty 1

## 2024-03-25 MED ORDER — BUPIVACAINE LIPOSOME 1.3 % IJ SUSP
INTRAMUSCULAR | Status: AC
  Filled 2024-03-25: qty 20

## 2024-03-25 MED ORDER — METFORMIN HCL ER 500 MG PO TB24: 500.0000 mg | ORAL_TABLET | Freq: Every day | ORAL | Status: DC

## 2024-03-25 MED ORDER — ACETAMINOPHEN 10 MG/ML IV SOLN
INTRAVENOUS | Status: AC
  Filled 2024-03-25: qty 100

## 2024-03-25 MED ORDER — ONDANSETRON HCL 4 MG PO TABS: 4.0000 mg | ORAL_TABLET | Freq: Four times a day (QID) | ORAL | Status: DC | PRN

## 2024-03-25 MED ORDER — FENTANYL CITRATE (PF) 250 MCG/5ML IJ SOLN
INTRAMUSCULAR | Status: AC
  Filled 2024-03-25: qty 5

## 2024-03-25 MED ORDER — INSULIN ASPART 100 UNIT/ML IJ SOLN
0.0000 [IU] | Freq: Every day | INTRAMUSCULAR | Status: DC
  Administered 2024-03-25: 2 [IU] via SUBCUTANEOUS

## 2024-03-25 MED ORDER — ALPRAZOLAM 0.5 MG PO TABS: 0.5000 mg | ORAL_TABLET | Freq: Two times a day (BID) | ORAL | Status: DC | PRN

## 2024-03-25 MED ORDER — PHENYLEPHRINE 80 MCG/ML (10ML) SYRINGE FOR IV PUSH (FOR BLOOD PRESSURE SUPPORT)
PREFILLED_SYRINGE | INTRAVENOUS | Status: AC
  Filled 2024-03-25: qty 10

## 2024-03-25 MED ORDER — HYDROCODONE-ACETAMINOPHEN 7.5-325 MG PO TABS: 1.0000 | ORAL_TABLET | Freq: Once | ORAL | Status: DC | PRN

## 2024-03-25 MED ORDER — PROPOFOL 10 MG/ML IV BOLUS
INTRAVENOUS | Status: AC
  Filled 2024-03-25: qty 20

## 2024-03-25 MED ORDER — MIDAZOLAM HCL 2 MG/2ML IJ SOLN
INTRAMUSCULAR | Status: DC | PRN
  Administered 2024-03-25: 2 mg via INTRAVENOUS

## 2024-03-25 MED ORDER — HYDROMORPHONE HCL 1 MG/ML IJ SOLN
INTRAMUSCULAR | Status: AC
  Filled 2024-03-25: qty 1

## 2024-03-25 MED ORDER — FENTANYL CITRATE (PF) 100 MCG/2ML IJ SOLN
INTRAMUSCULAR | Status: AC
  Filled 2024-03-25: qty 2

## 2024-03-25 MED ORDER — SUGAMMADEX SODIUM 200 MG/2ML IV SOLN
INTRAVENOUS | Status: DC | PRN
Start: 2024-03-25 — End: 2024-03-25
  Administered 2024-03-25: 200 mg via INTRAVENOUS

## 2024-03-25 MED ORDER — OXYCODONE HCL 5 MG PO TABS
10.0000 mg | ORAL_TABLET | ORAL | Status: DC | PRN
  Administered 2024-03-25 – 2024-03-26 (×4): 10 mg via ORAL
  Filled 2024-03-25 (×4): qty 2

## 2024-03-25 MED ORDER — ACETAMINOPHEN 325 MG PO TABS
650.0000 mg | ORAL_TABLET | ORAL | Status: DC | PRN
Start: 2024-03-25 — End: 2024-03-26
  Administered 2024-03-25: 650 mg via ORAL
  Filled 2024-03-25: qty 2

## 2024-03-25 MED ORDER — PHENOL 1.4 % MT LIQD: 1.0000 | OROMUCOSAL | Status: DC | PRN

## 2024-03-25 MED ORDER — DEXAMETHASONE SODIUM PHOSPHATE 10 MG/ML IJ SOLN
INTRAMUSCULAR | Status: DC | PRN
  Administered 2024-03-25: 10 mg via INTRAVENOUS

## 2024-03-25 MED ORDER — FLEET ENEMA RE ENEM: 1.0000 | ENEMA | Freq: Once | RECTAL | Status: DC | PRN

## 2024-03-25 MED ORDER — MENTHOL 3 MG MT LOZG: 1.0000 | LOZENGE | OROMUCOSAL | Status: DC | PRN

## 2024-03-25 MED ORDER — SODIUM CHLORIDE 0.9% FLUSH
INTRAVENOUS | Status: DC | PRN
Start: 2024-03-25 — End: 2024-03-25
  Administered 2024-03-25: 10 mL

## 2024-03-25 MED ORDER — ACETAMINOPHEN 10 MG/ML IV SOLN
INTRAVENOUS | Status: DC | PRN
Start: 2024-03-25 — End: 2024-03-25
  Administered 2024-03-25: 1000 mg via INTRAVENOUS

## 2024-03-25 MED ORDER — HYDROMORPHONE HCL 1 MG/ML IJ SOLN: 0.5000 mg | INTRAMUSCULAR | Status: DC | PRN

## 2024-03-25 SURGICAL SUPPLY — 76 items
BAG COUNTER SPONGE SURGICOUNT (BAG) ×1 IMPLANT
BAND RUBBER #18 3X1/16 STRL (MISCELLANEOUS) ×2 IMPLANT
BASKET BONE COLLECTION (BASKET) IMPLANT
BLADE CLIPPER SURG (BLADE) IMPLANT
BLADE SURG 11 STRL SS (BLADE) ×1 IMPLANT
BUR 14 MATCH 3 (BUR) IMPLANT
BUR CARBIDE MATCH 3.0 (BURR) IMPLANT
BUR MATCHSTICK NEURO 3.0 LAGG (BURR) IMPLANT
BUR MR8 14 BALL 5 (BUR) IMPLANT
BUR SURG IBUR 4X12.5 (BURR) ×1 IMPLANT
CAGE LORD SPINAL 10X28 (Lead) IMPLANT
CANISTER SUCTION 3000ML PPV (SUCTIONS) ×1 IMPLANT
CNTNR URN SCR LID CUP LEK RST (MISCELLANEOUS) ×1 IMPLANT
COVER BACK TABLE 60X90IN (DRAPES) ×1 IMPLANT
DERMABOND ADVANCED .7 DNX12 (GAUZE/BANDAGES/DRESSINGS) ×2 IMPLANT
DRAIN JACKSON PRATT 10MM FLAT (MISCELLANEOUS) IMPLANT
DRAPE 3/4 80X56 (DRAPES) ×1 IMPLANT
DRAPE C-ARM 42X72 X-RAY (DRAPES) ×2 IMPLANT
DRAPE C-ARMOR (DRAPES) IMPLANT
DRAPE LAPAROTOMY 100X72X124 (DRAPES) ×1 IMPLANT
DRAPE MICROSCOPE SLANT 54X150 (MISCELLANEOUS) ×1 IMPLANT
DRAPE SHEET LG 3/4 BI-LAMINATE (DRAPES) ×4 IMPLANT
DRSG OPSITE POSTOP 4X6 (GAUZE/BANDAGES/DRESSINGS) IMPLANT
DURAPREP 26ML APPLICATOR (WOUND CARE) ×1 IMPLANT
ELECT COATED BLADE 2.86 ST (ELECTRODE) ×1 IMPLANT
ELECTRODE BLDE INSULATED 6.5IN (ELECTROSURGICAL) ×1 IMPLANT
ELECTRODE REM PT RTRN 9FT ADLT (ELECTROSURGICAL) ×1 IMPLANT
EVACUATOR SILICONE 100CC (DRAIN) IMPLANT
EXTENDER TAB GUIDE SV 5.5/6.0 (INSTRUMENTS) IMPLANT
FEE COVERAGE SUPPORT O-ARM (MISCELLANEOUS) ×1 IMPLANT
GAUZE 4X4 16PLY ~~LOC~~+RFID DBL (SPONGE) IMPLANT
GAUZE SPONGE 4X4 12PLY STRL (GAUZE/BANDAGES/DRESSINGS) IMPLANT
GLOVE BIO SURGEON STRL SZ7 (GLOVE) ×4 IMPLANT
GLOVE BIOGEL PI IND STRL 7.5 (GLOVE) ×2 IMPLANT
GLOVE BIOGEL PI IND STRL 8 (GLOVE) ×4 IMPLANT
GLOVE ECLIPSE 8.0 STRL XLNG CF (GLOVE) ×1 IMPLANT
GLOVE EXAM NITRILE XL STR (GLOVE) IMPLANT
GOWN STRL REUS W/ TWL LRG LVL3 (GOWN DISPOSABLE) ×1 IMPLANT
GOWN STRL REUS W/ TWL XL LVL3 (GOWN DISPOSABLE) ×2 IMPLANT
GOWN STRL REUS W/TWL 2XL LVL3 (GOWN DISPOSABLE) IMPLANT
HEMOSTAT POWDER KIT SURGIFOAM (HEMOSTASIS) ×1 IMPLANT
KIT BASIN OR (CUSTOM PROCEDURE TRAY) ×1 IMPLANT
KIT INFUSE X SMALL 1.4CC (Orthopedic Implant) IMPLANT
KIT TURNOVER KIT B (KITS) ×1 IMPLANT
KNIFE ANNULOTOMY GREY RETRACT (ORTHOPEDIC DISPOSABLE SUPPLIES) IMPLANT
MARKER SPHERE PSV REFLC NDI (MISCELLANEOUS) ×5 IMPLANT
MILL BONE PREP (MISCELLANEOUS) ×1 IMPLANT
NDL HYPO 18GX1.5 BLUNT FILL (NEEDLE) IMPLANT
NDL HYPO 21X1.5 SAFETY (NEEDLE) IMPLANT
NDL HYPO 22X1.5 SAFETY MO (MISCELLANEOUS) ×1 IMPLANT
NDL SPNL 18GX3.5 QUINCKE PK (NEEDLE) IMPLANT
NEEDLE HYPO 18GX1.5 BLUNT FILL (NEEDLE) IMPLANT
NEEDLE HYPO 21X1.5 SAFETY (NEEDLE) ×1 IMPLANT
NEEDLE HYPO 22X1.5 SAFETY MO (MISCELLANEOUS) ×1 IMPLANT
NEEDLE SPNL 18GX3.5 QUINCKE PK (NEEDLE) IMPLANT
NS IRRIG 1000ML POUR BTL (IV SOLUTION) ×1 IMPLANT
PACK LAMINECTOMY NEURO (CUSTOM PROCEDURE TRAY) ×1 IMPLANT
PAD ARMBOARD POSITIONER FOAM (MISCELLANEOUS) ×3 IMPLANT
PIN BONE FIX 150 (PIN) IMPLANT
PUTTY GRAFTON DBF 6CC W/DELIVE (Putty) IMPLANT
ROD 5.5 CCM PERC 40 (Rod) IMPLANT
SCREW FENS MAS CCM 7.5X45 (Screw) IMPLANT
SCREW FENS MAS CCM 7.5X50 (Screw) IMPLANT
SCREW SET 5.5/6.0MM SOLERA (Screw) IMPLANT
SPIKE FLUID TRANSFER (MISCELLANEOUS) ×1 IMPLANT
SPONGE SURGIFOAM ABS GEL 100 (HEMOSTASIS) IMPLANT
SPONGE T-LAP 4X18 ~~LOC~~+RFID (SPONGE) IMPLANT
SUT MNCRL AB 4-0 PS2 18 (SUTURE) ×1 IMPLANT
SUT VIC AB 0 CT1 18XCR BRD8 (SUTURE) ×1 IMPLANT
SUT VIC AB 2-0 CP2 18 (SUTURE) ×1 IMPLANT
SUT VIC AB 3-0 FS2 27 (SUTURE) IMPLANT
SYR 30ML LL (SYRINGE) ×1 IMPLANT
TOWEL GREEN STERILE (TOWEL DISPOSABLE) ×1 IMPLANT
TOWEL GREEN STERILE FF (TOWEL DISPOSABLE) ×1 IMPLANT
TRAY FOLEY MTR SLVR 16FR STAT (SET/KITS/TRAYS/PACK) ×1 IMPLANT
WATER STERILE IRR 1000ML POUR (IV SOLUTION) ×1 IMPLANT

## 2024-03-25 NOTE — H&P (Signed)
 CC: left foot drop  HPI:     Patient is a 53 y.o. female with DM presents with progressive left leg radiculopathy, left foot drop.  She was found to have severe lumbar nerve root impingement at L4-5.    Patient Active Problem List   Diagnosis Date Noted   Moderate episode of recurrent major depressive disorder (HCC) 01/17/2024   Peritonsillar abscess 12/30/2023   Cervical spinal stenosis 12/03/2023   Preop examination 11/06/2023   Cervical spondylosis with radiculopathy 04/24/2023   Hot flashes 04/24/2023   DDD (degenerative disc disease), lumbar 02/07/2023   Hospital discharge follow-up 02/07/2023   Syncope 01/27/2023   Central cord syndrome (HCC) 01/27/2023   Allergic conjunctivitis of both eyes 01/02/2023   Meralgia paresthetica of left side 01/01/2023   Type 2 diabetes mellitus with other specified complication (HCC) 11/14/2022   Plantar wart of both feet 11/13/2022   Simple chronic bronchitis (HCC) 11/13/2022   Acute sinusitis 09/13/2022   Sciatica 04/30/2022   Primary osteoarthritis of hands, bilateral 03/06/2022   Gastroesophageal reflux disease without esophagitis 03/06/2022   Restless legs syndrome (RLS) 07/18/2021   Intertrigo 05/30/2021   Early satiety 04/10/2021   Encounter for general adult medical examination with abnormal findings 12/15/2020   Mixed hyperlipidemia 12/15/2020   Vitamin D  deficiency 09/15/2020   Osteoarthritis of right foot 07/05/2020   History of fusion of cervical spine 06/28/2020   Arthritis 06/01/2020   Diabetic neuropathy (HCC) 06/01/2020   Hearing problem of right ear 05/02/2020   Obesity (BMI 30-39.9) 05/02/2020   Tobacco abuse 05/02/2020   Prediabetes 05/02/2020   GAD (generalized anxiety disorder) 05/07/2019   Hypertension 04/23/2019   HNP (herniated nucleus pulposus), lumbar 07/03/2013   Chronic idiopathic constipation 10/31/2012   Past Medical History:  Diagnosis Date   Anxiety    Arthritis    Chronic pain    DDD  (degenerative disc disease)    Depression    Diabetes mellitus without complication (HCC)    GERD (gastroesophageal reflux disease)    HBP (high blood pressure)    Headache    Hernia    History of hiatal hernia    Multiple gastric ulcers    Neuromuscular disorder (HCC)    Neuropathy bilateral feet and hands   Rotator cuff syndrome of left shoulder 10/09/2011   Ulcer disease     Past Surgical History:  Procedure Laterality Date   ANTERIOR CERVICAL DECOMP/DISCECTOMY FUSION N/A 12/03/2023   Procedure: ANTERIOR CERVICAL DECOMPRESSION/DISCECTOMY FUSION CERVICAL THREE-FOUR CERVICAL FOUR-FIVE CERVICAL FIVE-SIX EXPLORE FUSION SIX-SEVEN;  Surgeon: Debby Dorn MATSU, MD;  Location: [facility];  Service: Neurosurgery;  Laterality: N/A;  3C   APPENDECTOMY     BACK SURGERY     x5   BIOPSY  05/04/2021   Procedure: BIOPSY;  Surgeon: Cindie Carlin POUR, DO;  Location: [facility];  Service: Endoscopy;;   CARPAL TUNNEL RELEASE     bilateral   CHOLECYSTECTOMY     COLONOSCOPY  10/20/2009   MFM:qmpjaoz anal canal/left-side diverticula and multiple polyps (hyperplastic). poor prep compromised exam. Next TCS 09/2014.   COLONOSCOPY WITH PROPOFOL  N/A 05/04/2021   Procedure: COLONOSCOPY WITH PROPOFOL ;  Surgeon: Cindie Carlin POUR, DO;  Location: [facility];  Service: Endoscopy;  Laterality: N/A;  9:45am   ESOPHAGOGASTRODUODENOSCOPY  03/06/2010   MFM:qnlm quadrant distal esophageal/patent tubular esophagus/small HH, antral erosions and ulcerations. Bx negative for persistent H.pylori   ESOPHAGOGASTRODUODENOSCOPY  10/20/2009   MFM:dfjoo HH/prepyloric antral ulcer s/p bx (+h.pylori), ERE   ESOPHAGOGASTRODUODENOSCOPY (EGD)  WITH PROPOFOL  N/A 05/04/2021   Procedure: ESOPHAGOGASTRODUODENOSCOPY (EGD) WITH PROPOFOL ;  Surgeon: Cindie Carlin POUR, DO;  Location: [facility];  Service: Endoscopy;  Laterality: N/A;   INTRAUTERINE DEVICE INSERTION     LEEP N/A 02/14/2021   Procedure: LOOP ELECTROSURGICAL EXCISION  PROCEDURE (LEEP);  Surgeon: Ozan, Jennifer, DO;  Location: [facility];  Service: Gynecology;  Laterality: N/A;   MASTECTOMY, PARTIAL Left    x 2   MOUTH SURGERY     All top teeth removed and 6-8 of bottom teeth removed   POLYPECTOMY  05/04/2021   Procedure: POLYPECTOMY;  Surgeon: Cindie Carlin POUR, DO;  Location: [facility];  Service: Endoscopy;;   ROTATOR CUFF REPAIR     right   TUBAL LIGATION      Medications Prior to Admission  Medication Sig Dispense Refill Last Dose/Taking   ALPRAZolam  (XANAX ) 0.5 MG tablet Take 1 tablet (0.5 mg total) by mouth 2 (two) times daily as needed for anxiety. 60 tablet 1 03/25/2024 at  6:30 AM   Aspirin-Acetaminophen -Caffeine (GOODYS EXTRA STRENGTH PO) Take 1 packet by mouth daily as needed (headache).   Past Week   DULoxetine  (CYMBALTA ) 30 MG capsule Take 1 capsule (30 mg total) by mouth daily. 30 capsule 1 03/25/2024 at  6:30 AM   gabapentin  (NEURONTIN ) 300 MG capsule Take 300 mg by mouth 3 (three) times daily.   03/25/2024 at  6:30 AM   hydrochlorothiazide  (HYDRODIURIL ) 25 MG tablet TAKE 1 TABLET BY MOUTH ONCE A DAY. 30 tablet 5 03/24/2024   metFORMIN  (GLUCOPHAGE -XR) 500 MG 24 hr tablet Take 1 tablet (500 mg total) by mouth daily with breakfast. 90 tablet 1 03/24/2024   methocarbamol  (ROBAXIN ) 500 MG tablet Take 1 tablet (500 mg total) by mouth every 6 (six) hours as needed for muscle spasms. 120 tablet 1 03/24/2024   oxyCODONE -acetaminophen  (PERCOCET/ROXICET) 5-325 MG tablet Take 2 tablets by mouth every 6 (six) hours as needed for severe pain (pain score 7-10).   03/24/2024   pantoprazole  (PROTONIX ) 40 MG tablet TAKE (1) TABLET BY MOUTH TWICE DAILY BEFORE MEALS. (Patient taking differently: Take 40 mg by mouth 2 (two) times daily as needed (heartburn).) 60 tablet 0 Past Month   rosuvastatin  (CRESTOR ) 5 MG tablet TAKE ONE TABLET BY MOUTH ONCE DAILY. 30 tablet 11 03/25/2024 at  6:30 AM   Glucose Blood (BLOOD GLUCOSE TEST STRIPS) STRP 1 each by In Vitro route in the morning,  at noon, and at bedtime. May substitute to any manufacturer covered by patient's insurance. 100 strip 5    Allergies  Allergen Reactions   Bayer Aspirin [Aspirin] Other (See Comments)    Hx ulcers  OK to take Goody powder, rarely   Nsaids Other (See Comments)    Hx ulcers   Amoxicillin Hives    Social History   Tobacco Use   Smoking status: Every Day    Current packs/day: 0.50    Types: Cigarettes    Passive exposure: Current   Smokeless tobacco: Never   Tobacco comments:    15 cigarettes a day as of 11/06/23  Substance Use Topics   Alcohol use: No    Family History  Problem Relation Age of Onset   Heart disease Other    Arthritis Other    Cancer Other    Asthma Other    Diabetes Other    Heart disease Mother    Diabetes Mother    Cancer Mother    Non-Hodgkin's lymphoma Mother    Hypertension Mother  Arthritis Mother    Lung cancer Father    Neuropathy Father    Hypertension Father      Review of Systems Pertinent items are noted in HPI.  Objective:   Patient Vitals for the past 8 hrs:  BP Temp Temp src Pulse Resp SpO2 Height Weight  03/25/24 1112 104/75 99.2 F (37.3 C) Oral 98 18 93 % 5' 5 (1.651 m) 124.3 kg   No intake/output data recorded. No intake/output data recorded.      General : Alert, cooperative, no distress, appears stated age   Head:  Normocephalic/atraumatic    Eyes: PERRL, conjunctiva/corneas clear, EOM's intact. Fundi could not be visualized Neck: Supple Chest:  Respirations unlabored Chest wall: no tenderness or deformity Heart: Regular rate and rhythm Abdomen: Soft, nontender and nondistended Extremities: warm and well-perfused Skin: normal turgor, color and texture Neurologic:  Alert, oriented x 3.  Eyes open spontaneously. PERRL, EOMI, VFC, no facial droop. V1-3 intact.  No dysarthria, tongue protrusion symmetric.  CNII-XII intact. Left DF 1/5, decreased sensation in left foot and leg       Data ReviewCBC:  Lab  Results  Component Value Date   WBC 14.4 (H) 03/19/2024   RBC 4.49 03/19/2024   BMP:  Lab Results  Component Value Date   GLUCOSE 146 (H) 03/19/2024   CO2 25 03/19/2024   BUN 22 (H) 03/19/2024   BUN 19 01/15/2024   CREATININE 0.73 03/19/2024   CREATININE 0.55 04/24/2019   CALCIUM  9.5 03/19/2024   Radiology review: see clinic note for details  Assessment:   Active Problems:   * No active hospital problems. *  53 yo F with hx of ACDF who has severe lumbar radiculopathy with foot drop  Plan:   - plan for L4-5 MIS TLIF - Risks, benefits, alternatives, and expected convalescence were discussed with her.  Risks discussed included, but were not limited to bleeding, pain, infection, scar, spinal fluid leak, neurologic deficit, instability, pseudoarthrosis, damage to nearby organs, and death.  Informed consent was obtained.

## 2024-03-25 NOTE — Op Note (Signed)
 PREOP DIAGNOSIS: Left lumbar radiculopathy  POSTOP DIAGNOSIS: Left lumbar radiculopathy  PROCEDURE: 1. L4-5 lumbar interbody fusion via left transforaminal approach with tubular retractors 2. Posterolateral arthrodesis, L4-5 3. Placement of interbody cage L4-5 4. Nonsegmental instrumentation with percutaneously placed pedicle screw and rod construct at L4-5 5. Harvest of local autograft 6. Use of morselized allograft 7. Use of microscope for microdissection 8.  ntraoperative neuronavigation with Stealth 9. Intraoperative CT scan  SURGEON: Dr. Dorn Ned, MD  ANESTHESIA: General Endotracheal  EBL: 75 ml  IMPLANTS:  Medtronic 7.5x50 mm screws at L4 x 2, 7.5 x 45 mm screws at L5 x2 40 mm rods 7-13 mm x 10 mm expandable cage  SPECIMENS: None  DRAINS: None  COMPLICATIONS: none  CONDITION: Stable to PACU  HISTORY: Brenda Taylor is a 53 y.o. female with morbid obesity, DM, hx of ACDF and hx of left L4-5 lumbar discectomy x 2 developed severe recurrent left lumbar radiculopathy with drop foot.  Injections and PT and medical therapy failed to improve her symptoms.  MRI imaging was performed and showed partially calcified disc reherniation at L4-5 and impingement of the traversing left L5 nerve roots with severe disc degeneration.  She clearly was not an optimal surgical candidate with her morbid obesity and smoking history.  However, given how severe her symptoms were and her dropfoot, I did discuss with her option of surgical treatment which would include left L4-5 MIS TLIF.  I emphasized that it was unclear whether her dropfoot would resolve given her history of 3 disc herniations causing nerve root impingement, and she did have multilevel degenerative changes in her lumbar spine and so I would not expect all of her symptoms to resolve with surgery.  Nevertheless, she strongly wished to proceed with surgery.  Risks, benefits, alternatives, and expected convalescence were discussed  with the patient.  Risks discussed included but were not limited to bleeding, pain, infection, scar, pseudoarthrosis, CSF leak, neurologic deficit, paralysis, and death.  The patient wished to proceed with surgery and informed consent was obtained.  PROCEDURE IN DETAIL: After informed consent was obtained and witnessed, the patient was brought to the operating room. After induction of general anesthesia, the patient was positioned on the operative table in the prone position on a Jackson table with all pressure points meticulously padded. The skin of the low back was then prepped and draped in the usual sterile fashion.  A PSIS iliac spine was placed, and intraoperative CT obtained to allow for neuronavigation.  Using the Stealth navigation, paramedian incisions were planned for pedicle screw trajectories.  Incisions were made with a 10 blade.  Navigated high speed drill was used to cannulate the pedicles, and pedicles were then tapped using navigation.  Ball ended feeler confirmed good cannulation with no breaches.  On the side contralateral to the TLIF, appropriately sized pedicle screws were placed using navigation.   Navigated initial dilator was then docked on the L4-5 facet  on the left side.  Sequential dilators and final tubular retractor was placed and locked in position, with navigation confirming good placement.  The microscope was then introduced in the field to allow for intraoperative microdissection.  Remaining muscle was moved off of the degenerated facet joint.  The inferior facets was removed using an osteotome and harvested for autograft.  Overgrown superior facets was then removed with rongeurs.  The traversing nerve root was identified after removal of epidural scar and good decompression was performed with removal of overgrown ligament and facets with  rongeurs.  The exiting nerve root in the foramen was also seen.  Epidural veins in the foramen were coagulated and cut with allowed access  to the disc space lateral to the traversing nerve root.  The traversing nerve root was gently retracted medially to allow greater access to the disc.  The disc was opened with 15 blade and pituitary rongeur was used to remove disc material.  Disc shavers of increasing size was then used to clean the disc space as well as to restore disc space height.  The interbody space was prepared with rasps and curettes with removal of the cartilage from the endplates.  Appropriate sized interbody spacer was then sized.  The interbody space was packed with morselized allograft mixed with autograft and BMP.  An expandable size 7 mm interbody spacer was then tamped into place with a mallet under x-ray guidance.  It was then expanded until snug with the endplates.  AP and lateral x-ray confirmed good position of the implant.  The wound was then irrigated thoroughly with bacitracin  impregnated irrigation and meticulous hemostasis was obtained.  The retractor was then removed and meticulous hemostasis was obtained in the muscle layer and subcutaneous layer.  Remaining pedicle screws were then placed using navigation.  Good purchase was noted.   Rods were then passed through the screw towers bilaterally and secured with screw caps.  AP and lateral X-ray confirmed good placement of instrumentation and interbody.  The screw caps were final tightened and the towers removed.  Navigated drill was used to decorticate the facet joint contralateral to the TLIF, and bone graft was placed in the facet and lateral gutter.  Wounds were irrigated thoroughly .  Exparel  mixed with Marcaine  was injected into the paraspinous muscles and subcutaneous tissues bilaterally.  The fascia was closed with 0 Vicryl stitches.  The dermal layer was closed with 2-0 Vicryl stitches in buried interrupted fashion.  The skin incisions were closed with 4-0 Monocryl subcuticular manner followed by Dermabond.  The PSIS pin was removed and small incision closed with 3-0  vicryl in buried fashion and Dermabond.  Sterile dressings were placed.  Patient was then flipped supine and extubated by the anesthesia service following commands and all 4 extremities.  All counts were correct at the end of surgery.  No complications were noted.

## 2024-03-25 NOTE — Anesthesia Procedure Notes (Signed)
 Procedure Name: Intubation Date/Time: 03/25/2024 1:49 PM  Performed by: Viviana Almarie DASEN, CRNAPre-anesthesia Checklist: Patient identified, Emergency Drugs available, Suction available and Patient being monitored Patient Re-evaluated:Patient Re-evaluated prior to induction Oxygen Delivery Method: Circle System Utilized Preoxygenation: Pre-oxygenation with 100% oxygen Induction Type: IV induction Ventilation: Mask ventilation without difficulty Tube type: Oral Number of attempts: 1 Airway Equipment and Method: Stylet, Oral airway and Bite block Placement Confirmation: ETT inserted through vocal cords under direct vision, positive ETCO2 and breath sounds checked- equal and bilateral Secured at: 21 cm Tube secured with: Tape Dental Injury: Teeth and Oropharynx as per pre-operative assessment

## 2024-03-25 NOTE — Progress Notes (Signed)
 Orthopedic Tech Progress Note Patient Details:  TINEA NOBILE 09/28/1970 [id number]  Ortho Devices Type of Ortho Device: Lumbar corsett Ortho Device/Splint Location: Delivered to PACU 6 Ortho Device/Splint Interventions: Rosa Adine MARLA Tanda 03/25/2024, 7:25 PM

## 2024-03-25 NOTE — Transfer of Care (Signed)
 Immediate Anesthesia Transfer of Care Note  Patient: Brenda Taylor  Procedure(s) Performed: LEFT LUMBAR FOUR-LUMBAR FIVE MINIMALLY INVASIVE TRANSFORAMINAL LUMBAR INTERBODY FUSION/DECOMPRESSION APPLICATION OF O-ARM  Patient Location: PACU  Anesthesia Type:General  Level of Consciousness: drowsy  Airway & Oxygen Therapy: Patient Spontanous Breathing and Patient connected to face mask oxygen  Post-op Assessment: Report given to RN and Post -op Vital signs reviewed and stable  Post vital signs: Reviewed and stable  Last Vitals:  Vitals Value Taken Time  BP 92/55   Temp 98.1   Pulse 102 03/25/24 17:19  Resp 25 03/25/24 17:19  SpO2 93 % 03/25/24 17:19  Vitals shown include unfiled device data.  Last Pain:  Vitals:   03/25/24 1133  TempSrc:   PainSc: 7          Complications: No notable events documented.

## 2024-03-26 DIAGNOSIS — M5416 Radiculopathy, lumbar region: Secondary | ICD-10-CM | POA: Diagnosis not present

## 2024-03-26 LAB — GLUCOSE, CAPILLARY: Glucose-Capillary: 143 mg/dL — ABNORMAL HIGH (ref 70–99)

## 2024-03-26 MED ORDER — OXYCODONE-ACETAMINOPHEN 5-325 MG PO TABS: 2.0000 | ORAL_TABLET | Freq: Four times a day (QID) | ORAL | 0 refills | Status: DC | PRN

## 2024-03-26 MED ORDER — DOCUSATE SODIUM 100 MG PO CAPS: 100.0000 mg | ORAL_CAPSULE | Freq: Two times a day (BID) | ORAL | 0 refills | Status: DC

## 2024-03-26 MED ORDER — METHOCARBAMOL 500 MG PO TABS: 500.0000 mg | ORAL_TABLET | Freq: Four times a day (QID) | ORAL | 1 refills | Status: AC | PRN

## 2024-03-26 NOTE — Plan of Care (Signed)
 Pt doing well. Pt and daughter given D/C instructions with verbal understanding. Rx's were sent to the pharmacy by MD. Pt's incision is clean and dry with no sign of infection. Pt's IV was removed prior to D/C. Pt D/C'd home via wheelchair per MD order. Pt is stable @ D/C and has no other needs at this time. Rema Fendt, RN

## 2024-03-26 NOTE — Anesthesia Postprocedure Evaluation (Signed)
 Anesthesia Post Note  Patient: Brenda Taylor  Procedure(s) Performed: LEFT LUMBAR FOUR-LUMBAR FIVE MINIMALLY INVASIVE TRANSFORAMINAL LUMBAR INTERBODY FUSION/DECOMPRESSION APPLICATION OF O-ARM     Patient location during evaluation: PACU Anesthesia Type: General Level of consciousness: sedated and patient cooperative Pain management: pain level controlled Vital Signs Assessment: post-procedure vital signs reviewed and stable Respiratory status: spontaneous breathing Cardiovascular status: stable Anesthetic complications: no   No notable events documented.  Last Vitals:  Vitals:   03/26/24 0331 03/26/24 0828  BP: 107/70 109/67  Pulse: 85 89  Resp: 18 18  Temp: 37.1 C 37.2 C  SpO2: 97% 94%    Last Pain:  Vitals:   03/26/24 0828  TempSrc: Oral  PainSc:                  [name]

## 2024-03-26 NOTE — Progress Notes (Signed)
 Per bedside RN the MD wants to wait until pt is seen in the office to arrange therapies. Appt for MD follow up is on AV[facility].  Pt has transportation home.

## 2024-03-26 NOTE — Progress Notes (Signed)
 Physical Therapy Treatment  Patient Details Name: Brenda Taylor MRN: [id number] DOB: Mar 16, 1971 Today's Date: 03/26/2024   History of Present Illness Pt is a 52 y.o female admitted 9/3 for scheduled L4-5 lumbar interbody fusion. Recently s/p ACDF C3-7 11/2023. PMH: DDD, central cord syndrome, DMII, osteoarthritis, HLD, neuropathy, HTN, back surgery x5.    PT Comments  Pt admitted with above diagnosis. At the time of PT eval, pt was able to demonstrate transfers and ambulation with gross CGA to supervision for safety and RW for support. Pt was educated on precautions, brace application/wearing schedule, appropriate activity progression, and car transfer. Pt currently with functional limitations due to the deficits listed below (see PT Problem List). Pt will benefit from skilled PT to increase their independence and safety with mobility to allow discharge to the venue listed below.      If plan is discharge home, recommend the following: A little help with bathing/dressing/bathroom;Assistance with cooking/housework;Assist for transportation;Help with stairs or ramp for entrance   Can travel by private vehicle        Equipment Recommendations  None recommended by PT    Recommendations for Other Services       Precautions / Restrictions Precautions Precautions: Fall;Back Precaution Booklet Issued: Yes (comment) Recall of Precautions/Restrictions: Intact Precaution/Restrictions Comments: Reviewed handout and pt was cued for precautions during functional mobility. Required Braces or Orthoses: Spinal Brace Spinal Brace: Lumbar corset;Applied in sitting position Restrictions Weight Bearing Restrictions Per Provider Order: No     Mobility  Bed Mobility Overal bed mobility: Needs Assistance Bed Mobility: Rolling, Sit to Sidelying Rolling: Modified independent (Device/Increase time)       Sit to sidelying: Min assist General bed mobility comments: HOB flat and rails lowered to simulate  home environment.    Transfers Overall transfer level: Needs assistance Equipment used: Rolling walker (2 wheels) Transfers: Sit to/from Stand Sit to Stand: Supervision           General transfer comment: S for safety with RW. Increased time to power up to full stand.    Ambulation/Gait Ambulation/Gait assistance: Contact guard assist Gait Distance (Feet): 150 Feet Assistive device: Rolling walker (2 wheels) Gait Pattern/deviations: Step-through pattern, Decreased stride length, Trunk flexed, Narrow base of support Gait velocity: Decreased Gait velocity interpretation: <1.31 ft/sec, indicative of household ambulator   General Gait Details: VC's for improved posture, closer walker proximity and forward gaze. No assist required but pt appears unsteady at times and requires hands on assist for safety.   Stairs Stairs: Yes Stairs assistance: Contact guard assist Stair Management: One rail Left, Step to pattern, Forwards, With cane Number of Stairs: 6 General stair comments: VC's for sequencing and general safety   Wheelchair Mobility     Tilt Bed    Modified Rankin (Stroke Patients Only)       Balance Overall balance assessment: Needs assistance Sitting-balance support: Feet supported, No upper extremity supported Sitting balance-Leahy Scale: Fair     Standing balance support: Bilateral upper extremity supported, During functional activity, Reliant on assistive device for balance Standing balance-Leahy Scale: Poor Standing balance comment: Reliant on RW                            Communication Communication Communication: No apparent difficulties  Cognition Arousal: Alert Behavior During Therapy: WFL for tasks assessed/performed   PT - Cognitive impairments: No apparent impairments  Following commands: Intact      Cueing Cueing Techniques: Verbal cues  Exercises      General Comments General comments  (skin integrity, edema, etc.): Pt educated on use of DME and AE for ADLs, as well as purchasing info      Pertinent Vitals/Pain Pain Assessment Pain Assessment: Faces Pain Score: 10-Worst pain ever Faces Pain Scale: Hurts little more Pain Location: Pt reports 10/10 pain at incision site however faces pain scale 4/10. Pain Descriptors / Indicators: Discomfort Pain Intervention(s): Limited activity within patient's tolerance, Monitored during session, Repositioned    Home Living Family/patient expects to be discharged to:: Private residence Living Arrangements: Spouse/significant other Available Help at Discharge: Family;Available 24 hours/day Type of Home: House Home Access: Stairs to enter Entrance Stairs-Rails: Left Entrance Stairs-Number of Steps: 6   Home Layout: One level Home Equipment: Rollator (4 wheels);Cane - single point;Shower Scientist, physiological (2 wheels) Additional Comments: pt has custody of 3 grandchildren but has other family support    Prior Function            PT Goals (current goals can now be found in the care plan section) Acute Rehab PT Goals Patient Stated Goal: Home at d/c PT Goal Formulation: With patient Time For Goal Achievement: 04/02/24 Potential to Achieve Goals: Good    Frequency    Min 5X/week      PT Plan      Co-evaluation              AM-PAC PT 6 Clicks Mobility   Outcome Measure  Help needed turning from your back to your side while in a flat bed without using bedrails?: A Little Help needed moving from lying on your back to sitting on the side of a flat bed without using bedrails?: A Little Help needed moving to and from a bed to a chair (including a wheelchair)?: A Little Help needed standing up from a chair using your arms (e.g., wheelchair or bedside chair)?: A Little Help needed to walk in hospital room?: A Little Help needed climbing 3-5 steps with a railing? : A Little 6 Click Score: 18    End of  Session Equipment Utilized During Treatment: Gait belt Activity Tolerance: Patient tolerated treatment well Patient left: in bed;with call bell/phone within reach Nurse Communication: Mobility status PT Visit Diagnosis: Unsteadiness on feet (R26.81);Pain Pain - part of body:  (back)     Time: 9158-9096 PT Time Calculation (min) (ACUTE ONLY): 22 min  Charges:      PT General Charges $$ ACUTE PT VISIT: 1 Visit                     Brenda Taylor, PT, DPT Acute Rehabilitation Services Secure Chat Preferred Office: [phone]    Brenda Taylor 03/26/2024, 10:24 AM

## 2024-03-26 NOTE — Evaluation (Signed)
 Occupational Therapy Evaluation Patient Details Name: SHAWNTA Taylor MRN: [id number] DOB: July 31, 1970 Today's Date: 03/26/2024   History of Present Illness   Pt is a 53 y.o female admitted 9/3 for scheduled L4-5 lumbar interbody fusion. Recently s/p ACDF C3-7 11/2023. PMH: DDD, central cord syndrome, DMII, osteoarthritis, HLD, neuropathy, HTN, back surgery x5.     Clinical Impressions Pt admitted based on above, and was seen based on problem list below. PTA pt was mod I with ADLs, with family assisting prn. Today pt is requiring set up  to min  assist for ADLs. Functional transfers are  s for safety with RW. Educated pt on compensatory techniques for peri hygiene, pt able to demo understanding. Pt inquired about potential AE for toileting and purchasing information, OT able to provide information. Educated pt on use of shower chair vs tub transfer bench for shower transfers. Pt reporting preference for shower chair. Pt would benefit from continuing her  HHOT. OT will continue to follow acutely to maximize functional independence.        If plan is discharge home, recommend the following:   A little help with walking and/or transfers;A little help with bathing/dressing/bathroom;Assistance with cooking/housework     Functional Status Assessment   Patient has had a recent decline in their functional status and demonstrates the ability to make significant improvements in function in a reasonable and predictable amount of time.     Equipment Recommendations   Tub/shower seat      Precautions/Restrictions   Precautions Precautions: Fall;Back Precaution Booklet Issued: Yes (comment) Recall of Precautions/Restrictions: Intact Required Braces or Orthoses: Spinal Brace Spinal Brace: Lumbar corset Restrictions Weight Bearing Restrictions Per Provider Order: No     Mobility Bed Mobility     General bed mobility comments: Received sitting EOB    Transfers Overall transfer  level: Needs assistance Equipment used: Rolling walker (2 wheels) Transfers: Sit to/from Stand Sit to Stand: Supervision           General transfer comment: S for safety with RW. Increased time      Balance Overall balance assessment: Needs assistance Sitting-balance support: Feet supported, No upper extremity supported Sitting balance-Leahy Scale: Fair     Standing balance support: Bilateral upper extremity supported, During functional activity, Reliant on assistive device for balance Standing balance-Leahy Scale: Poor Standing balance comment: Reliant on RW           ADL either performed or assessed with clinical judgement   ADL Overall ADL's : Needs assistance/impaired Eating/Feeding: Set up;Sitting   Grooming: Wash/dry hands;Supervision/safety;Standing Grooming Details (indicate cue type and reason): S for safety         Upper Body Dressing : Set up;Sitting   Lower Body Dressing: Minimal assistance;Sit to/from stand Lower Body Dressing Details (indicate cue type and reason): Min assist to reach BLEs, unable to figure 4 legs Toilet Transfer: Supervision/safety;Rolling walker (2 wheels);Ambulation;Regular Toilet;Grab bars Toilet Transfer Details (indicate cue type and reason): S for safety, use of GB for controlled descent Toileting- Clothing Manipulation and Hygiene: Supervision/safety;Cueing for compensatory techniques;Sit to/from stand Toileting - Clothing Manipulation Details (indicate cue type and reason): S for safety with cueing for compensatory techniques to maintain back precautions     Functional mobility during ADLs: Supervision/safety;Rolling walker (2 wheels) General ADL Comments: Educated pt on compensatory techniques for LB dressing and toileting. Also educated on use of AE and DME for safety with transfers and adhering to precautions     Vision Baseline Vision/History: 0 No visual deficits  Patient Visual Report: No change from baseline Vision  Assessment?: No apparent visual deficits            Pertinent Vitals/Pain Pain Assessment Pain Assessment: Faces Faces Pain Scale: Hurts little more Pain Location: Surgical site Pain Descriptors / Indicators: Discomfort Pain Intervention(s): Monitored during session     Extremity/Trunk Assessment Upper Extremity Assessment Upper Extremity Assessment: Generalized weakness (Decreased BUE strength L>R. Hx of central cord syndrome)   Lower Extremity Assessment Lower Extremity Assessment: Defer to PT evaluation   Cervical / Trunk Assessment Cervical / Trunk Assessment: Back Surgery (neck surgery 11/2023)   Communication Communication Communication: No apparent difficulties   Cognition Arousal: Alert Behavior During Therapy: [facility] for tasks assessed/performed Cognition: No apparent impairments     Following commands: Intact       Cueing  General Comments   Cueing Techniques: Verbal cues  Pt educated on use of DME and AE for ADLs, as well as purchasing info           Home Living Family/patient expects to be discharged to:: Private residence Living Arrangements: Spouse/significant other Available Help at Discharge: Family;Available 24 hours/day Type of Home: House Home Access: Stairs to enter [employer] of Steps: 5 Entrance Stairs-Rails: Left Home Layout: One level     Bathroom Shower/Tub: Chief Strategy Officer: Standard Bathroom Accessibility: Yes How Accessible: Accessible via walker Home Equipment: Rollator (4 wheels);Cane - single point;Shower seat;BSC/3in1   Additional Comments: pt has custody of 3 grandchildren but has other family support      Prior Functioning/Environment Prior Level of Function : Independent/Modified Independent;Working/employed;Driving             Mobility Comments: Pt reports using SPC for mobility, participating in HHPT ADLs Comments: Has been completing HHOT, prn assist for LB ADLs    OT Problem  List: Decreased range of motion;Decreased strength;Decreased activity tolerance;Impaired balance (sitting and/or standing);Decreased safety awareness;Decreased knowledge of use of DME or AE   OT Treatment/Interventions: Self-care/ADL training;Therapeutic exercise;Energy conservation;DME and/or AE instruction;Therapeutic activities;Patient/family education;Balance training      OT Goals(Current goals can be found in the care plan section)   Acute Rehab OT Goals Patient Stated Goal: To get new shower seat OT Goal Formulation: With patient Time For Goal Achievement: 04/09/24 Potential to Achieve Goals: Good   OT Frequency:  Min 2X/week    AM-PAC OT 6 Clicks Daily Activity     Outcome Measure Help from another person eating meals?: None Help from another person taking care of personal grooming?: A Little Help from another person toileting, which includes using toliet, bedpan, or urinal?: A Little Help from another person bathing (including washing, rinsing, drying)?: A Little Help from another person to put on and taking off regular upper body clothing?: A Little Help from another person to put on and taking off regular lower body clothing?: A Little 6 Click Score: 19   End of Session Equipment Utilized During Treatment: Back brace;Rolling walker (2 wheels) Nurse Communication: Mobility status  Activity Tolerance: Patient tolerated treatment well Patient left: in bed;with call bell/phone within reach;Other (comment) (MD present)  OT Visit Diagnosis: Unsteadiness on feet (R26.81);Other abnormalities of gait and mobility (R26.89);Muscle weakness (generalized) (M62.81)                Time: 9250-9187 OT Time Calculation (min): 23 min Charges:  OT General Charges $OT Visit: 1 Visit OT Evaluation $OT Eval Moderate Complexity: 1 Mod  Keone Kamer C, OT  Acute Rehabilitation Services Office [phone]  Secure chat preferred   Adrianne GORMAN Savers 03/26/2024, 8:36 AM

## 2024-03-26 NOTE — Discharge Summary (Signed)
 Physician Discharge Summary  Patient ID: Brenda Taylor MRN: [id number] DOB/AGE: 01/06/1971 53 y.o.  Admit date: 03/25/2024 Discharge date: 03/26/2024  Admission Diagnoses:  Left leg radiculopathy  Discharge Diagnoses:  Same Principal Problem:   Chronic left-sided lumbar radiculopathy   Discharged Condition: Stable  Hospital Course:  ROE KOFFMAN is a 53 y.o. female with history of multiple lumbar microdiscectomies who underwent elective L4-5 TLIF for severe lumbar radiculopathy with foot drop.  Postoperatively, the patient was mobilized with the help PT, pain was well-controlled with oral meds. Patient reported improved ambulation ability postop. Patient was deemed ready for discharge.   Treatments: Surgery -L4-5 MIS TLIF  Discharge Exam: Blood pressure 107/70, pulse 85, temperature 98.7 F (37.1 C), temperature source Oral, resp. rate 18, height 5' 5 (1.651 m), weight 124.3 kg, SpO2 97%. Awake, alert, oriented Speech fluent, appropriate CN grossly intact 5/5 BUE/BLE except 2/5 left dorsiflexion Wound c/d/i  Disposition: Discharge disposition: 01-Home or Self Care       Discharge Instructions     Incentive spirometry RT   Complete by: As directed       Allergies as of 03/26/2024       Reactions   Bayer Aspirin [aspirin] Other (See Comments)   Hx ulcers OK to take Goody powder, rarely   Nsaids Other (See Comments)   Hx ulcers   Amoxicillin Hives        Medication List     PAUSE taking these medications    GOODYS EXTRA STRENGTH PO Wait to take this until: March 30, 2024 Take 1 packet by mouth daily as needed (headache).       TAKE these medications    ALPRAZolam  0.5 MG tablet Commonly known as: XANAX  Take 1 tablet (0.5 mg total) by mouth 2 (two) times daily as needed for anxiety.   BLOOD GLUCOSE TEST STRIPS Strp 1 each by In Vitro route in the morning, at noon, and at bedtime. May substitute to any manufacturer covered by patient's  insurance.   docusate sodium  100 MG capsule Commonly known as: COLACE Take 1 capsule (100 mg total) by mouth 2 (two) times daily.   DULoxetine  30 MG capsule Commonly known as: CYMBALTA  Take 1 capsule (30 mg total) by mouth daily.   gabapentin  300 MG capsule Commonly known as: NEURONTIN  Take 300 mg by mouth 3 (three) times daily.   hydrochlorothiazide  25 MG tablet Commonly known as: HYDRODIURIL  TAKE 1 TABLET BY MOUTH ONCE A DAY.   metFORMIN  500 MG 24 hr tablet Commonly known as: GLUCOPHAGE -XR Take 1 tablet (500 mg total) by mouth daily with breakfast.   methocarbamol  500 MG tablet Commonly known as: ROBAXIN  Take 1 tablet (500 mg total) by mouth every 6 (six) hours as needed for muscle spasms.   oxyCODONE -acetaminophen  5-325 MG tablet Commonly known as: PERCOCET/ROXICET Take 2 tablets by mouth every 6 (six) hours as needed for severe pain (pain score 7-10).   pantoprazole  40 MG tablet Commonly known as: PROTONIX  TAKE (1) TABLET BY MOUTH TWICE DAILY BEFORE MEALS. What changed: See the new instructions.   rosuvastatin  5 MG tablet Commonly known as: CRESTOR  TAKE ONE TABLET BY MOUTH ONCE DAILY.        Follow-up Information     Debby Dorn MATSU, MD Follow up in 2 week(s).   Specialty: Neurosurgery Contact information: [address] [place] [address] [phone]                 Signed: Dorn MATSU Debby  03/26/2024, 8:09 AM

## 2024-03-27 ENCOUNTER — Encounter (HOSPITAL_COMMUNITY): Payer: Self-pay | Admitting: Neurosurgery

## 2024-03-27 LAB — BPAM RBC
Blood Product Expiration Date: 202509152359
Blood Product Expiration Date: 202510072359
Unit Type and Rh: 9500
Unit Type and Rh: 9500

## 2024-03-27 LAB — TYPE AND SCREEN
ABO/RH(D): O NEG
Antibody Screen: POSITIVE
PT AG Type: NEGATIVE
Unit division: 0
Unit division: 0

## 2024-04-01 ENCOUNTER — Ambulatory Visit: Admitting: Internal Medicine

## 2024-04-13 NOTE — Therapy (Incomplete)
 OUTPATIENT PHYSICAL THERAPY THORACOLUMBAR EVALUATION   Patient Name: Brenda Taylor MRN: [id number] DOB:[birth year], 53 y.o., female Today's Date: 04/13/2024  END OF SESSION:   Past Medical History:  Diagnosis Date   Anxiety    Arthritis    Chronic pain    DDD (degenerative disc disease)    Depression    Diabetes mellitus without complication (HCC)    GERD (gastroesophageal reflux disease)    HBP (high blood pressure)    Headache    Hernia    History of hiatal hernia    Multiple gastric ulcers    Neuromuscular disorder (HCC)    Neuropathy bilateral feet and hands   Rotator cuff syndrome of left shoulder 10/09/2011   Ulcer disease    Past Surgical History:  Procedure Laterality Date   ANTERIOR CERVICAL DECOMP/DISCECTOMY FUSION N/A 12/03/2023   Procedure: ANTERIOR CERVICAL DECOMPRESSION/DISCECTOMY FUSION CERVICAL THREE-FOUR CERVICAL FOUR-FIVE CERVICAL FIVE-SIX EXPLORE FUSION SIX-SEVEN;  Surgeon: Debby Dorn MATSU, MD;  [facility];  Service: Neurosurgery;  Laterality: N/A;  3C   APPENDECTOMY     BACK SURGERY     x5   BIOPSY  05/04/2021   Procedure: BIOPSY;  Surgeon: Cindie Carlin POUR, DO;  [facility];  Service: Endoscopy;;   CARPAL TUNNEL RELEASE     bilateral   CHOLECYSTECTOMY     COLONOSCOPY  10/20/2009   MFM:qmpjaoz anal canal/left-side diverticula and multiple polyps (hyperplastic). poor prep compromised exam. Next TCS 09/2014.   COLONOSCOPY WITH PROPOFOL  N/A 05/04/2021   Procedure: COLONOSCOPY WITH PROPOFOL ;  Surgeon: Cindie Carlin POUR, DO;  [facility];  Service: Endoscopy;  Laterality: N/A;  9:45am   ESOPHAGOGASTRODUODENOSCOPY  03/06/2010   MFM:qnlm quadrant distal esophageal/patent tubular esophagus/small HH, antral erosions and ulcerations. Bx negative for persistent H.pylori   ESOPHAGOGASTRODUODENOSCOPY  10/20/2009   MFM:dfjoo HH/prepyloric antral ulcer s/p bx (+h.pylori), ERE   ESOPHAGOGASTRODUODENOSCOPY (EGD) WITH PROPOFOL  N/A  05/04/2021   Procedure: ESOPHAGOGASTRODUODENOSCOPY (EGD) WITH PROPOFOL ;  Surgeon: Cindie Carlin POUR, DO;  [facility];  Service: Endoscopy;  Laterality: N/A;   INTRAUTERINE DEVICE INSERTION     LEEP N/A 02/14/2021   Procedure: LOOP ELECTROSURGICAL EXCISION PROCEDURE (LEEP);  Surgeon: Ozan, Jennifer, DO;  Location: [facility];  Service: Gynecology;  Laterality: N/A;   MASTECTOMY, PARTIAL Left    x 2   MOUTH SURGERY     All top teeth removed and 6-8 of bottom teeth removed   POLYPECTOMY  05/04/2021   Procedure: POLYPECTOMY;  Surgeon: Cindie Carlin POUR, DO;  [facility];  Service: Endoscopy;;   ROTATOR CUFF REPAIR     right   TRANSFORAMINAL LUMBAR INTERBODY FUSION W/ MIS 1 LEVEL N/A 03/25/2024   Procedure: LEFT LUMBAR FOUR-LUMBAR FIVE MINIMALLY INVASIVE TRANSFORAMINAL LUMBAR INTERBODY FUSION/DECOMPRESSION;  Surgeon: Debby Dorn MATSU, MD;  Location: [facility] OR;  Service: Neurosurgery;  Laterality: N/A;  LEFT LUMBAR FOUR-LUMBAR FIVE MINIMALLY INVASIVE TRANSFORAMINAL LUMBAR INTERBODY FUSION/DECOMPRESSION   TUBAL LIGATION     Patient Active Problem List   Diagnosis Date Noted   Chronic left-sided lumbar radiculopathy 03/25/2024   Moderate episode of recurrent major depressive disorder (HCC) 01/17/2024   Peritonsillar abscess 12/30/2023   Cervical spinal stenosis 12/03/2023   Preop examination 11/06/2023   Cervical spondylosis with radiculopathy 04/24/2023   Hot flashes 04/24/2023   DDD (degenerative disc disease), lumbar 02/07/2023   Hospital discharge follow-up 02/07/2023   Syncope 01/27/2023   Central cord syndrome (HCC) 01/27/2023   Allergic conjunctivitis of both eyes 01/02/2023  Meralgia paresthetica of left side 01/01/2023   Type 2 diabetes mellitus with other specified complication (HCC) 11/14/2022   Plantar wart of both feet 11/13/2022   Simple chronic bronchitis (HCC) 11/13/2022   Acute sinusitis 09/13/2022   Sciatica 04/30/2022   Primary osteoarthritis of  hands, bilateral 03/06/2022   Gastroesophageal reflux disease without esophagitis 03/06/2022   Restless legs syndrome (RLS) 07/18/2021   Intertrigo 05/30/2021   Early satiety 04/10/2021   Encounter for general adult medical examination with abnormal findings 12/15/2020   Mixed hyperlipidemia 12/15/2020   Vitamin D  deficiency 09/15/2020   Osteoarthritis of right foot 07/05/2020   History of fusion of cervical spine 06/28/2020   Arthritis 06/01/2020   Diabetic neuropathy (HCC) 06/01/2020   Hearing problem of right ear 05/02/2020   Obesity (BMI 30-39.9) 05/02/2020   Tobacco abuse 05/02/2020   Prediabetes 05/02/2020   GAD (generalized anxiety disorder) 05/07/2019   Hypertension 04/23/2019   HNP (herniated nucleus pulposus), lumbar 07/03/2013   Chronic idiopathic constipation 10/31/2012    PCP: ***  REFERRING PROVIDER: ***  REFERRING DIAG:   M47.26 (ICD-10-CM) - Other spondylosis with radiculopathy, lumbar region    Rationale for Evaluation and Treatment: {HABREHAB:27488}  THERAPY DIAG:  No diagnosis found.  ONSET DATE: ***  SUBJECTIVE:                                                                                                                                                                                           SUBJECTIVE STATEMENT: ***  PERTINENT HISTORY:  ***  PAIN:  Are you having pain? {OPRCPAIN:27236}  PRECAUTIONS: {Therapy precautions:24002}  RED FLAGS: {PT Red Flags:29287}   WEIGHT BEARING RESTRICTIONS: {Yes ***/No:24003}  FALLS:  Has patient fallen in last 6 months? {fallsyesno:27318}  LIVING ENVIRONMENT: Lives with: {OPRC lives with:25569::lives with their family} Lives in: {Lives in:25570} Stairs: {opstairs:27293} Has following equipment at home: {Assistive devices:23999}  OCCUPATION: ***  PLOF: {PLOF:24004}  PATIENT GOALS: ***  NEXT MD VISIT: ***  OBJECTIVE:  Note: Objective measures were completed at Evaluation unless  otherwise noted.  DIAGNOSTIC FINDINGS:  ***  PATIENT SURVEYS:  {rehab surveys:24030}  COGNITION: Overall cognitive status: {cognition:24006}     SENSATION: {sensation:27233}  MUSCLE LENGTH: Hamstrings: Right *** deg; Left *** deg Debby test: Right *** deg; Left *** deg  POSTURE: {posture:25561}  PALPATION: ***  LUMBAR ROM:   AROM eval  Flexion   Extension   Right lateral flexion   Left lateral flexion   Right rotation   Left rotation    (Blank rows = not tested)  LOWER EXTREMITY ROM:     {AROM/PROM:27142}  Right eval Left eval  Hip flexion    Hip extension    Hip abduction    Hip adduction    Hip internal rotation    Hip external rotation    Knee flexion    Knee extension    Ankle dorsiflexion    Ankle plantarflexion    Ankle inversion    Ankle eversion     (Blank rows = not tested)  LOWER EXTREMITY MMT:    MMT Right eval Left eval  Hip flexion    Hip extension    Hip abduction    Hip adduction    Hip internal rotation    Hip external rotation    Knee flexion    Knee extension    Ankle dorsiflexion    Ankle plantarflexion    Ankle inversion    Ankle eversion     (Blank rows = not tested)  LUMBAR SPECIAL TESTS:  {lumbar special test:25242}  FUNCTIONAL TESTS:  {Functional tests:24029}  GAIT: Distance walked: *** Assistive device utilized: {Assistive devices:23999} Level of assistance: {Levels of assistance:24026} Comments: ***  TREATMENT DATE: ***                                                                                                                                 PATIENT EDUCATION:  Education details: *** Person educated: {Person educated:25204} Education method: {Education Method:25205} Education comprehension: {Education Comprehension:25206}  HOME EXERCISE PROGRAM: ***  ASSESSMENT:  CLINICAL IMPRESSION: Patient is a *** y.o. *** who was seen today for physical therapy evaluation and treatment for ***.    OBJECTIVE IMPAIRMENTS: {opptimpairments:25111}.   ACTIVITY LIMITATIONS: {activitylimitations:27494}  PARTICIPATION LIMITATIONS: {participationrestrictions:25113}  PERSONAL FACTORS: {Personal factors:25162} are also affecting patient's functional outcome.   REHAB POTENTIAL: {rehabpotential:25112}  CLINICAL DECISION MAKING: {clinical decision making:25114}  EVALUATION COMPLEXITY: {Evaluation complexity:25115}  GOALS: Goals reviewed with patient? {yes/no:20286}  SHORT TERM GOALS: Target date: ***  Pt will be independent with HEP in order to demonstrate participation in Physical Therapy POC.  Baseline: Goal status: {GOALSTATUS:25110}  2.  Pt will report ***/10 pain with mobility in order to demonstrate improved pain with ADLs.  Baseline:  Goal status: {GOALSTATUS:25110}  LONG TERM GOALS: Target date: ***  Pt will improve *** by *** in order to demonstrate improved functional strength to return to desired activities.  Baseline: see objective.  Goal status: {GOALSTATUS:25110}  2.  Pt will improve 2 MWT by *** in order to demonstrate improved functional ambulatory capacity in community setting.  Baseline: see objective.  Goal status: {GOALSTATUS:25110}  3.  Pt will improve Modified Oswestry score by *** in order to demonstrate improved pain with functional goals and outcomes. Baseline: see objective.  Goal status: {GOALSTATUS:25110}  4.  Pt will report ***/10 pain with mobility in order to demonstrate reduced pain with ADLs lasting greater than 30 minutes.  Baseline: see objective.  Goal status: {GOALSTATUS:25110}   5.  *** Baseline:  Goal status: INITIAL  6.  *** Baseline:  Goal status: INITIAL  PLAN:  PT FREQUENCY: {rehab frequency:25116}  PT DURATION: {rehab duration:25117}  PLANNED INTERVENTIONS: {rehab planned interventions:25118::97110-Therapeutic exercises,97530- Therapeutic [phone]- Neuromuscular re-education,97535- Self Rjmz,02859-  Manual therapy,Patient/Family education}.  PLAN FOR NEXT SESSION: ***  Lamarr LITTIE Citrin PT, DPT [facility] 336 [phone] office  Lamarr LITTIE Citrin, PT 04/13/2024, 5:43 PM

## 2024-04-14 ENCOUNTER — Ambulatory Visit (HOSPITAL_COMMUNITY)

## 2024-04-27 ENCOUNTER — Ambulatory Visit: Admitting: Internal Medicine

## 2024-04-30 NOTE — Therapy (Signed)
 OUTPATIENT PHYSICAL THERAPY THORACOLUMBAR EVALUATION   Patient Name: Brenda Taylor MRN: [id number] DOB:09-16-1970, 53 y.o., female Today's Date: 04/30/2024  END OF SESSION:   Past Medical History:  Diagnosis Date   Anxiety    Arthritis    Chronic pain    DDD (degenerative disc disease)    Depression    Diabetes mellitus without complication (HCC)    GERD (gastroesophageal reflux disease)    HBP (high blood pressure)    Headache    Hernia    History of hiatal hernia    Multiple gastric ulcers    Neuromuscular disorder (HCC)    Neuropathy bilateral feet and hands   Rotator cuff syndrome of left shoulder 10/09/2011   Ulcer disease    Past Surgical History:  Procedure Laterality Date   ANTERIOR CERVICAL DECOMP/DISCECTOMY FUSION N/A 12/03/2023   Procedure: ANTERIOR CERVICAL DECOMPRESSION/DISCECTOMY FUSION CERVICAL THREE-FOUR CERVICAL FOUR-FIVE CERVICAL FIVE-SIX EXPLORE FUSION SIX-SEVEN;  Surgeon: Debby Dorn MATSU, MD;  [facility]: [facility];  Service: Neurosurgery;  Laterality: N/A;  3C   APPENDECTOMY     BACK SURGERY     x5   BIOPSY  05/04/2021   Procedure: BIOPSY;  Surgeon: Cindie Carlin POUR, DO;  [facility];  Service: Endoscopy;;   CARPAL TUNNEL RELEASE     bilateral   CHOLECYSTECTOMY     COLONOSCOPY  10/20/2009   MFM:qmpjaoz anal canal/left-side diverticula and multiple polyps (hyperplastic). poor prep compromised exam. Next TCS 09/2014.   COLONOSCOPY WITH PROPOFOL  N/A 05/04/2021   Procedure: COLONOSCOPY WITH PROPOFOL ;  Surgeon: Cindie Carlin POUR, DO;  [facility];  Service: Endoscopy;  Laterality: N/A;  9:45am   ESOPHAGOGASTRODUODENOSCOPY  03/06/2010   MFM:qnlm quadrant distal esophageal/patent tubular esophagus/small HH, antral erosions and ulcerations. Bx negative for persistent H.pylori   ESOPHAGOGASTRODUODENOSCOPY  10/20/2009   MFM:dfjoo HH/prepyloric antral ulcer s/p bx (+h.pylori), ERE   ESOPHAGOGASTRODUODENOSCOPY (EGD) WITH PROPOFOL  N/A  05/04/2021   Procedure: ESOPHAGOGASTRODUODENOSCOPY (EGD) WITH PROPOFOL ;  Surgeon: Cindie Carlin POUR, DO;  [facility];  Service: Endoscopy;  Laterality: N/A;   INTRAUTERINE DEVICE INSERTION     LEEP N/A 02/14/2021   Procedure: LOOP ELECTROSURGICAL EXCISION PROCEDURE (LEEP);  Surgeon: Ozan, Jennifer, DO;  Location: [facility];  Service: Gynecology;  Laterality: N/A;   MASTECTOMY, PARTIAL Left    x 2   MOUTH SURGERY     All top teeth removed and 6-8 of bottom teeth removed   POLYPECTOMY  05/04/2021   Procedure: POLYPECTOMY;  Surgeon: Cindie Carlin POUR, DO;  [facility];  Service: Endoscopy;;   ROTATOR CUFF REPAIR     right   TRANSFORAMINAL LUMBAR INTERBODY FUSION W/ MIS 1 LEVEL N/A 03/25/2024   Procedure: LEFT LUMBAR FOUR-LUMBAR FIVE MINIMALLY INVASIVE TRANSFORAMINAL LUMBAR INTERBODY FUSION/DECOMPRESSION;  Surgeon: Debby Dorn MATSU, MD;  Location: [facility] OR;  Service: Neurosurgery;  Laterality: N/A;  LEFT LUMBAR FOUR-LUMBAR FIVE MINIMALLY INVASIVE TRANSFORAMINAL LUMBAR INTERBODY FUSION/DECOMPRESSION   TUBAL LIGATION     Patient Active Problem List   Diagnosis Date Noted   Chronic left-sided lumbar radiculopathy 03/25/2024   Moderate episode of recurrent major depressive disorder (HCC) 01/17/2024   Peritonsillar abscess 12/30/2023   Cervical spinal stenosis 12/03/2023   Preop examination 11/06/2023   Cervical spondylosis with radiculopathy 04/24/2023   Hot flashes 04/24/2023   DDD (degenerative disc disease), lumbar 02/07/2023   Hospital discharge follow-up 02/07/2023   Syncope 01/27/2023   Central cord syndrome (HCC) 01/27/2023   Allergic conjunctivitis of both eyes 01/02/2023  Meralgia paresthetica of left side 01/01/2023   Type 2 diabetes mellitus with other specified complication (HCC) 11/14/2022   Plantar wart of both feet 11/13/2022   Simple chronic bronchitis (HCC) 11/13/2022   Acute sinusitis 09/13/2022   Sciatica 04/30/2022   Primary osteoarthritis of  hands, bilateral 03/06/2022   Gastroesophageal reflux disease without esophagitis 03/06/2022   Restless legs syndrome (RLS) 07/18/2021   Intertrigo 05/30/2021   Early satiety 04/10/2021   Encounter for general adult medical examination with abnormal findings 12/15/2020   Mixed hyperlipidemia 12/15/2020   Vitamin D  deficiency 09/15/2020   Osteoarthritis of right foot 07/05/2020   History of fusion of cervical spine 06/28/2020   Arthritis 06/01/2020   Diabetic neuropathy (HCC) 06/01/2020   Hearing problem of right ear 05/02/2020   Obesity (BMI 30-39.9) 05/02/2020   Tobacco abuse 05/02/2020   Prediabetes 05/02/2020   GAD (generalized anxiety disorder) 05/07/2019   Hypertension 04/23/2019   HNP (herniated nucleus pulposus), lumbar 07/03/2013   Chronic idiopathic constipation 10/31/2012    PCP: Tobie Suzzane POUR, MD  REFERRING PROVIDER: Johnanna Camie Mates, PA-C  REFERRING DIAG: [phone] (ICD-10-CM) - Other spondylosis with radiculopathy, lumbar region  Rationale for Evaluation and Treatment: Rehabilitation  THERAPY DIAG:  No diagnosis found.  ONSET DATE: ***  SUBJECTIVE:                                                                                                                                                                                           SUBJECTIVE STATEMENT: ***  PERTINENT HISTORY:  ***  PAIN:  Are you having pain? {OPRCPAIN:27236}  PRECAUTIONS: {Therapy precautions:24002}  RED FLAGS: {PT Red Flags:29287}   WEIGHT BEARING RESTRICTIONS: {Yes ***/No:24003}  FALLS:  Has patient fallen in last 6 months? {fallsyesno:27318}  LIVING ENVIRONMENT: Lives with: {OPRC lives with:25569::lives with their family} Lives in: {Lives in:25570} Stairs: {opstairs:27293} Has following equipment at home: {Assistive devices:23999}  OCCUPATION: ***  PLOF: {PLOF:24004}  PATIENT GOALS: ***  NEXT MD VISIT: ***  OBJECTIVE:  Note: Objective measures were  completed at Evaluation unless otherwise noted.  DIAGNOSTIC FINDINGS:  ***  PATIENT SURVEYS:  Modified Oswestry:  MODIFIED OSWESTRY DISABILITY SCALE  Date: *** Score  Pain intensity {ODI 1:32962}  2. Personal care (washing, dressing, etc.) {ODI 2:32963}  3. Lifting {ODI 3:32964}  4. Walking {ODI 4:32965}  5. Sitting {ODI 5:32966}  6. Standing {ODI 6:32967}  7. Sleeping {ODI 7:32968}  8. Social Life {ODI 8:32969}  9. Traveling {ODI 9:32970}  10. Employment/ Homemaking {ODI 10:32971}  Total ***/50   Interpretation of scores: Score Category Description  0-20% Minimal Disability The patient can cope with most living  activities. Usually no treatment is indicated apart from advice on lifting, sitting and exercise  21-40% Moderate Disability The patient experiences more pain and difficulty with sitting, lifting and standing. Travel and social life are more difficult and they may be disabled from work. Personal care, sexual activity and sleeping are not grossly affected, and the patient can usually be managed by conservative means  41-60% Severe Disability Pain remains the main problem in this group, but activities of daily living are affected. These patients require a detailed investigation  61-80% Crippled Back pain impinges on all aspects of the patient's life. Positive intervention is required  81-100% Bed-bound These patients are either bed-bound or exaggerating their symptoms  Bluford FORBES Zoe DELENA Karon DELENA, et al. Surgery versus conservative management of stable thoracolumbar fracture: the PRESTO feasibility RCT. [place] ([place]): [employer]; 2021 Nov. [facility] Technology Assessment, No. 25.62.) Appendix 3, Oswestry Disability Index category descriptors. Available from: [employer]cz  Minimally Clinically Important Difference (MCID) = 12.8%  COGNITION: Overall cognitive status: {cognition:24006}     SENSATION: {sensation:27233}  MUSCLE  LENGTH: Hamstrings: Right *** deg; Left *** deg Debby test: Right *** deg; Left *** deg  POSTURE: {posture:25561}  PALPATION: ***  LUMBAR ROM:   AROM eval  Flexion   Extension   Right lateral flexion   Left lateral flexion   Right rotation   Left rotation    (Blank rows = not tested)  LOWER EXTREMITY ROM:     {AROM/PROM:27142}  Right eval Left eval  Hip flexion    Hip extension    Hip abduction    Hip adduction    Hip internal rotation    Hip external rotation    Knee flexion    Knee extension    Ankle dorsiflexion    Ankle plantarflexion    Ankle inversion    Ankle eversion     (Blank rows = not tested)  LOWER EXTREMITY MMT:    MMT Right eval Left eval  Hip flexion    Hip extension    Hip abduction    Hip adduction    Hip internal rotation    Hip external rotation    Knee flexion    Knee extension    Ankle dorsiflexion    Ankle plantarflexion    Ankle inversion    Ankle eversion     (Blank rows = not tested)  LUMBAR SPECIAL TESTS:  {lumbar special test:25242}  FUNCTIONAL TESTS:  {Functional tests:24029}  GAIT: Distance walked: *** Assistive device utilized: {Assistive devices:23999} Level of assistance: {Levels of assistance:24026} Comments: ***  TREATMENT DATE: 05/01/24 physical therapy evaluation and HEP instruction                                                                                                                                 PATIENT EDUCATION:  Education details: Patient educated on exam findings, POC, scope of PT, HEP, and ***. Person educated: Patient Education method:  Explanation, Demonstration, and Handouts Education comprehension: verbalized understanding, returned demonstration, verbal cues required, and tactile cues required  HOME EXERCISE PROGRAM: ***  ASSESSMENT:  CLINICAL IMPRESSION: Patient is a 53 y.o. female who was seen today for physical therapy evaluation and treatment for M47.26 (ICD-10-CM) -  Other spondylosis with radiculopathy, lumbar region.   OBJECTIVE IMPAIRMENTS: {opptimpairments:25111}.   ACTIVITY LIMITATIONS: {activitylimitations:27494}  PARTICIPATION LIMITATIONS: {participationrestrictions:25113}  PERSONAL FACTORS: {Personal factors:25162} are also affecting patient's functional outcome.   REHAB POTENTIAL: Good  CLINICAL DECISION MAKING: Evolving/moderate complexity  EVALUATION COMPLEXITY: Moderate   GOALS: Goals reviewed with patient? {yes/no:20286}  SHORT TERM GOALS: Target date: ***  *** Baseline: Goal status: INITIAL  2.  *** Baseline:  Goal status: INITIAL  3.  *** Baseline:  Goal status: INITIAL  4.  *** Baseline:  Goal status: INITIAL  5.  *** Baseline:  Goal status: INITIAL  6.  *** Baseline:  Goal status: INITIAL  LONG TERM GOALS: Target date: ***  *** Baseline:  Goal status: INITIAL  2.  *** Baseline:  Goal status: INITIAL  3.  *** Baseline:  Goal status: INITIAL  4.  *** Baseline:  Goal status: INITIAL  5.  *** Baseline:  Goal status: INITIAL  6.  *** Baseline:  Goal status: INITIAL  PLAN:  PT FREQUENCY: {rehab frequency:25116}  PT DURATION: {rehab duration:25117}  PLANNED INTERVENTIONS: 97164- PT Re-evaluation, 97110-Therapeutic exercises, 97530- Therapeutic activity, 97112- Neuromuscular re-education, 97535- Self Care, 02859- Manual therapy, [id number]- Gait training, [id number]- Orthotic Fit/training, [id number]- Canalith repositioning, [id number]- Aquatic Therapy, 97760- Splinting, 97597- Wound care (first 20 sq cm), 97598- Wound care (each additional 20 sq cm)Patient/Family education, Balance training, Stair training, Taping, Dry Needling, Joint mobilization, Joint manipulation, Spinal manipulation, Spinal mobilization, Scar mobilization, and DME instructions. SABRA  PLAN FOR NEXT SESSION: Review HEP and goals;    Greig GORMAN Quivers, PT 04/30/2024, 1:05 PM 1:05 PM, 04/30/24 Timonthy Hovater Small Daryll Spisak MPT [place] [facility] [place] [phone]

## 2024-05-01 ENCOUNTER — Ambulatory Visit (HOSPITAL_COMMUNITY): Attending: Surgery

## 2024-05-01 ENCOUNTER — Other Ambulatory Visit: Payer: Self-pay

## 2024-05-01 DIAGNOSIS — M6281 Muscle weakness (generalized): Secondary | ICD-10-CM | POA: Diagnosis present

## 2024-05-01 DIAGNOSIS — R29818 Other symptoms and signs involving the nervous system: Secondary | ICD-10-CM | POA: Insufficient documentation

## 2024-05-01 DIAGNOSIS — R262 Difficulty in walking, not elsewhere classified: Secondary | ICD-10-CM | POA: Diagnosis present

## 2024-05-01 DIAGNOSIS — M5416 Radiculopathy, lumbar region: Secondary | ICD-10-CM | POA: Insufficient documentation

## 2024-05-01 DIAGNOSIS — M545 Low back pain, unspecified: Secondary | ICD-10-CM | POA: Diagnosis present

## 2024-05-07 ENCOUNTER — Other Ambulatory Visit: Payer: Self-pay | Admitting: Internal Medicine

## 2024-05-07 DIAGNOSIS — F411 Generalized anxiety disorder: Secondary | ICD-10-CM

## 2024-05-07 DIAGNOSIS — F331 Major depressive disorder, recurrent, moderate: Secondary | ICD-10-CM

## 2024-05-12 ENCOUNTER — Ambulatory Visit (HOSPITAL_COMMUNITY)

## 2024-05-12 ENCOUNTER — Encounter (HOSPITAL_COMMUNITY): Payer: Self-pay

## 2024-05-12 NOTE — Therapy (Signed)
 [facility] [address] [place], [place], [address] Phone: [phone]   Fax:  [phone]  Patient Details  Name: Brenda Taylor MRN: [id number] Date of Birth: 1971-03-16 Referring Provider:  No ref. provider found  Encounter Date: 05/12/2024  Pt was called concerning her no show to this afternoons appointment. Left message with reminder for next appointment time and instructions to call back should she not be able to make it. This is no show #1.  Lang Ada, PT, DPT [facility] Office: [phone] 5:03 PM, 05/12/24   [facility] at [facility] [address] [place], [place], [address] Phone: [phone]   Fax:  [phone]

## 2024-05-21 ENCOUNTER — Ambulatory Visit (HOSPITAL_COMMUNITY)

## 2024-05-21 NOTE — Therapy (Deleted)
 OUTPATIENT PHYSICAL THERAPY THORACOLUMBAR TREATMENT   Patient Name: Brenda Taylor MRN: [id number] DOB:1971-03-03, 53 y.o., female Today's Date: 05/21/2024  END OF SESSION:    Past Medical History:  Diagnosis Date   Anxiety    Arthritis    Chronic pain    DDD (degenerative disc disease)    Depression    Diabetes mellitus without complication (HCC)    GERD (gastroesophageal reflux disease)    HBP (high blood pressure)    Headache    Hernia    History of hiatal hernia    Multiple gastric ulcers    Neuromuscular disorder (HCC)    Neuropathy bilateral feet and hands   Rotator cuff syndrome of left shoulder 10/09/2011   Ulcer disease    Past Surgical History:  Procedure Laterality Date   ANTERIOR CERVICAL DECOMP/DISCECTOMY FUSION N/A 12/03/2023   Procedure: ANTERIOR CERVICAL DECOMPRESSION/DISCECTOMY FUSION CERVICAL THREE-FOUR CERVICAL FOUR-FIVE CERVICAL FIVE-SIX EXPLORE FUSION SIX-SEVEN;  Surgeon: Debby Dorn MATSU, MD;  Location: [facility] OR;  Service: Neurosurgery;  Laterality: N/A;  3C   APPENDECTOMY     BACK SURGERY     x5   BIOPSY  05/04/2021   Procedure: BIOPSY;  Surgeon: Cindie Carlin POUR, DO;  Location: [facility];  Service: Endoscopy;;   CARPAL TUNNEL RELEASE     bilateral   CHOLECYSTECTOMY     COLONOSCOPY  10/20/2009   MFM:qmpjaoz anal canal/left-side diverticula and multiple polyps (hyperplastic). poor prep compromised exam. Next TCS 09/2014.   COLONOSCOPY WITH PROPOFOL  N/A 05/04/2021   Procedure: COLONOSCOPY WITH PROPOFOL ;  Surgeon: Cindie Carlin POUR, DO;  Location: [facility];  Service: Endoscopy;  Laterality: N/A;  9:45am   ESOPHAGOGASTRODUODENOSCOPY  03/06/2010   MFM:qnlm quadrant distal esophageal/patent tubular esophagus/small HH, antral erosions and ulcerations. Bx negative for persistent H.pylori   ESOPHAGOGASTRODUODENOSCOPY  10/20/2009   MFM:dfjoo HH/prepyloric antral ulcer s/p bx (+h.pylori), ERE   ESOPHAGOGASTRODUODENOSCOPY (EGD) WITH PROPOFOL  N/A  05/04/2021   Procedure: ESOPHAGOGASTRODUODENOSCOPY (EGD) WITH PROPOFOL ;  Surgeon: Cindie Carlin POUR, DO;  Location: [facility];  Service: Endoscopy;  Laterality: N/A;   INTRAUTERINE DEVICE INSERTION     LEEP N/A 02/14/2021   Procedure: LOOP ELECTROSURGICAL EXCISION PROCEDURE (LEEP);  Surgeon: Ozan, Jennifer, DO;  Location: [facility];  Service: Gynecology;  Laterality: N/A;   MASTECTOMY, PARTIAL Left    x 2   MOUTH SURGERY     All top teeth removed and 6-8 of bottom teeth removed   POLYPECTOMY  05/04/2021   Procedure: POLYPECTOMY;  Surgeon: Cindie Carlin POUR, DO;  Location: [facility];  Service: Endoscopy;;   ROTATOR CUFF REPAIR     right   TRANSFORAMINAL LUMBAR INTERBODY FUSION W/ MIS 1 LEVEL N/A 03/25/2024   Procedure: LEFT LUMBAR FOUR-LUMBAR FIVE MINIMALLY INVASIVE TRANSFORAMINAL LUMBAR INTERBODY FUSION/DECOMPRESSION;  Surgeon: Debby Dorn MATSU, MD;  Location: [facility] OR;  Service: Neurosurgery;  Laterality: N/A;  LEFT LUMBAR FOUR-LUMBAR FIVE MINIMALLY INVASIVE TRANSFORAMINAL LUMBAR INTERBODY FUSION/DECOMPRESSION   TUBAL LIGATION     Patient Active Problem List   Diagnosis Date Noted   Chronic left-sided lumbar radiculopathy 03/25/2024   Moderate episode of recurrent major depressive disorder (HCC) 01/17/2024   Peritonsillar abscess 12/30/2023   Cervical spinal stenosis 12/03/2023   Preop examination 11/06/2023   Cervical spondylosis with radiculopathy 04/24/2023   Hot flashes 04/24/2023   DDD (degenerative disc disease), lumbar 02/07/2023   Hospital discharge follow-up 02/07/2023   Syncope 01/27/2023   Central cord syndrome (HCC) 01/27/2023   Allergic conjunctivitis of both eyes 01/02/2023  Meralgia paresthetica of left side 01/01/2023   Type 2 diabetes mellitus with other specified complication (HCC) 11/14/2022   Plantar wart of both feet 11/13/2022   Simple chronic bronchitis (HCC) 11/13/2022   Acute sinusitis 09/13/2022   Sciatica 04/30/2022   Primary osteoarthritis of  hands, bilateral 03/06/2022   Gastroesophageal reflux disease without esophagitis 03/06/2022   Restless legs syndrome (RLS) 07/18/2021   Intertrigo 05/30/2021   Early satiety 04/10/2021   Encounter for general adult medical examination with abnormal findings 12/15/2020   Mixed hyperlipidemia 12/15/2020   Vitamin D  deficiency 09/15/2020   Osteoarthritis of right foot 07/05/2020   History of fusion of cervical spine 06/28/2020   Arthritis 06/01/2020   Diabetic neuropathy (HCC) 06/01/2020   Hearing problem of right ear 05/02/2020   Obesity (BMI 30-39.9) 05/02/2020   Tobacco abuse 05/02/2020   Prediabetes 05/02/2020   GAD (generalized anxiety disorder) 05/07/2019   Hypertension 04/23/2019   HNP (herniated nucleus pulposus), lumbar 07/03/2013   Chronic idiopathic constipation 10/31/2012    PCP: Tobie Suzzane POUR, MD  REFERRING PROVIDER: Johnanna Camie Mates, PA-C  REFERRING DIAG: [phone] (ICD-10-CM) - Other spondylosis with radiculopathy, lumbar region  Rationale for Evaluation and Treatment: Rehabilitation  THERAPY DIAG:  No diagnosis found.  ONSET DATE: s/p lumbar fusion 03/25/24; cervical fusion in May of this year  SUBJECTIVE:                                                                                                                                                                                           SUBJECTIVE STATEMENT: ***  Eval: Chronic pain in the back for about 20 years; multiple discectomies and back surgeries; neck fusion in May last 2 surgeries by Dr. Debby.  Had some therapy after neck surgery; continued with left foot drop.  Foot drop started April 19th; leg is still numb, tingling and burning.  Feels like her back pain is just as bad.  Arrives with [facility] but needs HHA to assist; needs assist with bathing and dressing. Wearing back brace; states she has gained a lot of weight due to being unable to move and is depressed.    PERTINENT HISTORY:  Multiple back  surgeries  Pt is a 53 y.o female admitted 9/3 for scheduled L4-5 lumbar interbody fusion. Recently s/p ACDF C3-7 11/2023. PMH: DDD, central cord syndrome, DMII, osteoarthritis, HLD, neuropathy, HTN, back surgery x5.  Brenda Taylor is a 53 y.o. female with history of multiple lumbar microdiscectomies who underwent elective L4-5 TLIF for severe lumbar radiculopathy with foot drop.  Postoperatively, the patient was mobilized with the help PT, pain was well-controlled with oral meds.  Patient reported improved ambulation ability postop. Patient was deemed ready for discharge.    Treatments: Surgery -L4-5 MIS TLIF  PAIN:  Are you having pain? Yes: NPRS scale: 7-9/10 back pain and down left leg Pain location: back and down to left foot Pain description: hypersensitive, dull achy pain in back Aggravating factors: standing and walking Relieving factors: sitting, pain medicine;   PRECAUTIONS: Fall   WEIGHT BEARING RESTRICTIONS: No  FALLS:  Has patient fallen in last 6 months? 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  DIAGNOSTIC FINDINGS:  MPRESSION: 1. Advanced chronic lower lumbar degeneration with chronic vacuum L3-L4 through L5-S1. With regard to left lower extremity radiating pain the symptomatic level(s) might be: - L3-L4 where severe left foraminal stenosis is related to bulky chronic left foraminal and far lateral disc herniation (series 106, image 23). Query left L3 radiculitis. Moderate  multifactorial spinal stenosis at this level appears stable. - L4-L5 where moderate to severe left lateral recess is related to caudal migrated disc material. Query left L5 radiculitis. Moderate biforaminal stenosis here is stable, L4 nerve levels. And no spinal stenosis at this level following laminectomy.   2. Chronic L5-S1 degeneration with moderate right lateral recess stenosis, moderate to severe left greater than right foraminal stenosis.   3. Moderate L2-L3 spinal stenosis appears progressed from the noncontrast MRI earlier this year, perhaps related to increased mild epidural lipomatosis. Chronic asymmetric and bulky right foraminal disc with moderate to severe right L2 neural foraminal stenosis.   4. L1-L2 mild spinal stenosis just below the tip of the conus medullaris is stable, although up to moderate left lateral recess stenosis there appears progressed, descending left L2 nerve level.     Electronically Signed   By: VEAR Hurst M.D.   On: 03/01/2024 12:38CLINICAL DATA:  Elective surgery.   EXAM: LUMBAR SPINE - 2-3 VIEW   COMPARISON:  Preoperative imaging   FINDINGS: Two fluoroscopic spot views of the lumbar spine submitted from the operating room, as well as 3D intraoperative images. Pedicle screws at L4 and L5 with interbody spacer. Fluoroscopy time 56 seconds. Dose 86.85 mGy.   IMPRESSION: Intraoperative fluoroscopy during lumbar fusion.     Electronically Signed   By: Andrea Gasman M.D.   On: 03/25/2024 16:59    PATIENT SURVEYS:  Modified Oswestry:  MODIFIED OSWESTRY DISABILITY SCALE  Date: 05/01/2024 Score                                Total 35/50; 70%   Interpretation of scores: Score Category Description  0-20% Minimal Disability The patient can cope with most living activities. Usually no treatment is indicated apart from advice on lifting, sitting and exercise  21-40% Moderate Disability The patient experiences more pain and  difficulty with sitting, lifting and standing. Travel and social life are more difficult and they may be disabled from work. Personal care, sexual activity and sleeping are not grossly affected, and the patient can usually be managed  by conservative means  41-60% Severe Disability Pain remains the main problem in this group, but activities of daily living are affected. These patients require a detailed investigation  61-80% Crippled Back pain impinges on all aspects of the patient's life. Positive intervention is required  81-100% Bed-bound These patients are either bed-bound or exaggerating their symptoms  Bluford FORBES Zoe DELENA Karon DELENA, et al. Surgery versus conservative management of stable thoracolumbar fracture: the PRESTO feasibility RCT. [place] ([place]): [employer]; 2021 Nov. [facility] Technology Assessment, No. 25.62.) Appendix 3, Oswestry Disability Index category descriptors. Available from: Findjewelers.cz  Minimally Clinically Important Difference (MCID) = 12.8%  COGNITION: Overall cognitive status: Within functional limits for tasks assessed     SENSATION: Reports N/T left leg  MUSCLE LENGTH: Hamstrings:  test next visit  POSTURE: rounded shoulders, forward head, and flexed trunk   PALPATION:   LUMBAR ROM: not tested due to back precautions no bending lifting or twisting  AROM eval  Flexion   Extension   Right lateral flexion   Left lateral flexion   Right rotation   Left rotation    (Blank rows = not tested)  LOWER EXTREMITY ROM:     Active  Right eval Left eval  Hip flexion    Hip extension    Hip abduction    Hip adduction    Hip internal rotation    Hip external rotation    Knee flexion    Knee extension    Ankle dorsiflexion    Ankle plantarflexion    Ankle inversion    Ankle eversion     (Blank rows = not tested)  LOWER EXTREMITY MMT:    MMT Right eval Left eval  Hip flexion 4+ 3-  Hip extension     Hip abduction    Hip adduction    Hip internal rotation    Hip external rotation    Knee flexion    Knee extension 5 3-  Ankle dorsiflexion 5 2+  Ankle plantarflexion    Ankle inversion    Ankle eversion     (Blank rows = not tested)  FUNCTIONAL TESTS:  2 minute walk test: 64 ft very antalgic drop foot on left; needs cGA, HHA  GAIT: Distance walked: 64 ft Assistive device utilized: Single point cane; uses a rollator at home Level of assistance: CGA and Min A Comments: left drop foot  TREATMENT DATE                                  05/21/24  EXERCISE LOG  Exercise Repetitions and Resistance Comments                       Blank cell = exercise not performed today    05/01/24 physical therapy evaluation and HEP instruction  PATIENT EDUCATION:  Education details: Patient educated on exam findings, POC, scope of PT, HEP, and discussed aquatic therapy but patient does not drive that far as far as [place]. Person educated: Patient Education method: Explanation, Demonstration, and Handouts Education comprehension: verbalized understanding, returned demonstration, verbal cues required, and tactile cues required  HOME EXERCISE PROGRAM: Access Code: [email or web address] URL: [email or web address] Date: 05/01/2024 Prepared by: [facility]  Exercises - Seated Transversus Abdominis Bracing  - 2 x daily - 7 x weekly - 1 sets - 10 reps - 5 sec hold - Seated Heel Slide  - 2 x daily - 7 x weekly - 1 sets - 10 reps - Seated Ankle Dorsiflexion AROM  - 2 x daily - 7 x weekly - 1 sets - 10 reps  ASSESSMENT:  CLINICAL IMPRESSION: *** Patient continues to require skilled physical therapy to address her remaining impairments to return to her prior level of function.    Eval: Patient is a 53 y.o. female who was seen today for physical therapy evaluation  and treatment for M47.26 (ICD-10-CM) - Other spondylosis with radiculopathy, lumbar region.  Patient demonstrates muscle weakness, reduced ROM, and fascial restrictions which are likely contributing to symptoms of pain and are negatively impacting patient ability to perform ADLs and functional mobility tasks. Patient will benefit from skilled physical therapy services to address these deficits to reduce pain and improve level of function with ADLs and functional mobility tasks. Patient is quite limited with her functional mobility; discussed with her that she may be a good candidate for aquatic therapy if she has limited tolerance for land therapy.     OBJECTIVE IMPAIRMENTS: Abnormal gait, decreased activity tolerance, decreased balance, decreased endurance, decreased mobility, difficulty walking, decreased ROM, decreased strength, increased fascial restrictions, impaired perceived functional ability, and pain.   ACTIVITY LIMITATIONS: carrying, lifting, bending, sitting, standing, squatting, sleeping, stairs, transfers, bed mobility, bathing, toileting, dressing, locomotion level, and caring for others  PARTICIPATION LIMITATIONS: meal prep, cleaning, laundry, driving, shopping, community activity, and occupation  REHAB POTENTIAL: Good  CLINICAL DECISION MAKING: Evolving/moderate complexity  EVALUATION COMPLEXITY: Moderate   GOALS: Goals reviewed with patient? No  SHORT TERM GOALS: Target date: 05/22/2024  patient will be independent with initial HEP  Baseline: Goal status: INITIAL  2.  Patient will report 30% improvement overall  Baseline:  Goal status: INITIAL   LONG TERM GOALS: Target date: 06/12/2024  Patient will be independent in self management strategies to improve quality of life and functional outcomes.  Baseline:  Goal status: INITIAL  2.  Patient will report 50% improvement overall  Baseline:  Goal status: INITIAL  3.  Patient will improve Modified Oswestry  score by 10 points to demonstrate improved perceived function  Baseline: 35/50 Goal status: INITIAL  4.  Patient will increased distance on 2 MWT to 160 ft with LRAD to demonstrate improved ability to efficiently walk household distances.   Baseline: 64 ft Goal status: INITIAL  PLAN:  PT FREQUENCY: 1x/week  PT DURATION: 6 weeks  PLANNED INTERVENTIONS: [id number]- PT Re-evaluation, 97110-Therapeutic exercises, 97530- Therapeutic activity, 97112- Neuromuscular re-education, 97535- Self Care, [id number]- Manual therapy, [id number]- Gait training, [phone]- Orthotic Fit/training, [phone]- Canalith repositioning, [id number]- Aquatic Therapy, [phone]- Splinting, [phone]- Wound care (first 20 sq cm), 97598- Wound care (each additional 20 sq cm)Patient/Family education, Balance training, Stair training, Taping, Dry Needling, Joint mobilization, Joint manipulation, Spinal manipulation, Spinal mobilization, Scar mobilization, and DME instructions. SABRA  PLAN FOR NEXT SESSION: Review HEP and goals; functional movement and strengthening; patient  quite limited in her tolerance for activity; may benefit more from aquatic therapy; focus on abdominal bracing with activity; postural strengthening.     Lacinda JAYSON Fass, PT 05/21/2024, 2:06 PM 2:06 PM, 05/21/24

## 2024-05-28 ENCOUNTER — Ambulatory Visit: Admitting: Internal Medicine

## 2024-05-28 ENCOUNTER — Encounter: Payer: Self-pay | Admitting: Internal Medicine

## 2024-05-28 VITALS — BP 126/79 | HR 107 | Ht 66.0 in | Wt 277.0 lb

## 2024-05-28 DIAGNOSIS — M51362 Other intervertebral disc degeneration, lumbar region with discogenic back pain and lower extremity pain: Secondary | ICD-10-CM | POA: Diagnosis not present

## 2024-05-28 DIAGNOSIS — E782 Mixed hyperlipidemia: Secondary | ICD-10-CM

## 2024-05-28 DIAGNOSIS — E1169 Type 2 diabetes mellitus with other specified complication: Secondary | ICD-10-CM

## 2024-05-28 DIAGNOSIS — F331 Major depressive disorder, recurrent, moderate: Secondary | ICD-10-CM | POA: Diagnosis not present

## 2024-05-28 DIAGNOSIS — F411 Generalized anxiety disorder: Secondary | ICD-10-CM

## 2024-05-28 DIAGNOSIS — Z23 Encounter for immunization: Secondary | ICD-10-CM | POA: Diagnosis not present

## 2024-05-28 DIAGNOSIS — M4722 Other spondylosis with radiculopathy, cervical region: Secondary | ICD-10-CM

## 2024-05-28 DIAGNOSIS — Z7984 Long term (current) use of oral hypoglycemic drugs: Secondary | ICD-10-CM

## 2024-05-28 MED ORDER — DULOXETINE HCL 60 MG PO CPEP: 60.0000 mg | ORAL_CAPSULE | Freq: Every day | ORAL | 1 refills | Status: AC

## 2024-05-28 MED ORDER — OXYCODONE-ACETAMINOPHEN 10-325 MG PO TABS: 1.0000 | ORAL_TABLET | Freq: Three times a day (TID) | ORAL | 0 refills | Status: DC | PRN

## 2024-05-28 NOTE — Assessment & Plan Note (Addendum)
 Uncontrolled currently due to recent physical stressors Increased dose of Cymbalta  to 60 mg QD Has Xanax  to 0.5 mg BID PRN Advised to avoid marijuana products - has quit now

## 2024-05-28 NOTE — Assessment & Plan Note (Signed)
 Uncontrolled due to recent physical stressors Has crying spells and anxiety as well Had lengthy discussion about coping mechanisms Increased dose of Cymbalta  to 60 mg QD, considering her neuropathy symptoms as well

## 2024-05-28 NOTE — Assessment & Plan Note (Addendum)
 Lab Results  Component Value Date   HGBA1C 7.2 (H) 03/19/2024   Uncontrolled due to diet noncompliance, check HbA1c Associated with HTN and HLD On metformin  500 mg QD She would benefit from GLP1 agonist therapy, but she prefers to improve her diet for now Advised to follow diabetic diet On statin F/u CMP and lipid panel Diabetic eye exam: Advised to follow up with Ophthalmology for diabetic eye exam

## 2024-05-28 NOTE — Progress Notes (Signed)
 Established Patient Office Visit  Subjective:  Patient ID: Brenda Taylor, female    DOB: 1971/07/10  Age: 53 y.o. MRN: [id number]  CC:  Chief Complaint  Patient presents with   Back Pain    Follow up visit from surgery. Would like pcv20 vaccine today. Needs to establish pain management per surgeon , next visit will be until January , was advised to get pain medication from PCP until then.    Pain Management   Diabetes   Hypertension    HPI Brenda Taylor is a 53 y.o. female with past medical history of hypertension, anxiety and colonic polyp who presents for f/u of her chronic medical conditions.  HTN: BP is well-controlled. Takes medications regularly. Patient denies headache, dizziness, chest pain, dyspnea or palpitations.  Type II DM: Her HbA1c has worsened to 7.2 now.  She takes metformin  500 mg QD. She has chronic fatigue, but denies polyuria or polyphagia.  Cervical spondylosis and lumbar radiculopathy: She had anterior cervical decompression/discectomy, C3-4, C4-5, C5-6 fusion on 12/03/23.  She had L4-5 fusion surgery in 09/25.  She has severe low back pain, radiating to LLE since then.  She has difficulty ambulating, uses a cane now.  Did not benefit much from PT.  She has been taking gabapentin  600 mg TID and Percocet 10-325 mg every 6 hours as needed.  Followed by spine surgery.  Anxiety: She had responded well to Cymbalta  30 mg once daily, but has been more anxious since her surgery as she is not able to do her routine activities. Takes Xanax  daily since her surgery. Denies SI or HI.  Past Medical History:  Diagnosis Date   Anxiety    Arthritis    Chronic pain    DDD (degenerative disc disease)    Depression    Diabetes mellitus without complication (HCC)    GERD (gastroesophageal reflux disease)    HBP (high blood pressure)    Headache    Hernia    History of hiatal hernia    Multiple gastric ulcers    Neuromuscular disorder (HCC)    Neuropathy bilateral feet and  hands   Rotator cuff syndrome of left shoulder 10/09/2011   Ulcer disease     Past Surgical History:  Procedure Laterality Date   ANTERIOR CERVICAL DECOMP/DISCECTOMY FUSION N/A 12/03/2023   Procedure: ANTERIOR CERVICAL DECOMPRESSION/DISCECTOMY FUSION CERVICAL THREE-FOUR CERVICAL FOUR-FIVE CERVICAL FIVE-SIX EXPLORE FUSION SIX-SEVEN;  Surgeon: Debby Dorn MATSU, MD;  Location: [facility] OR;  Service: Neurosurgery;  Laterality: N/A;  3C   APPENDECTOMY     BACK SURGERY     x5   BIOPSY  05/04/2021   Procedure: BIOPSY;  Surgeon: Cindie Carlin POUR, DO;  Location: [facility];  Service: Endoscopy;;   CARPAL TUNNEL RELEASE     bilateral   CHOLECYSTECTOMY     COLONOSCOPY  10/20/2009   MFM:qmpjaoz anal canal/left-side diverticula and multiple polyps (hyperplastic). poor prep compromised exam. Next TCS 09/2014.   COLONOSCOPY WITH PROPOFOL  N/A 05/04/2021   Procedure: COLONOSCOPY WITH PROPOFOL ;  Surgeon: Cindie Carlin POUR, DO;  Location: [facility];  Service: Endoscopy;  Laterality: N/A;  9:45am   ESOPHAGOGASTRODUODENOSCOPY  03/06/2010   MFM:qnlm quadrant distal esophageal/patent tubular esophagus/small HH, antral erosions and ulcerations. Bx negative for persistent H.pylori   ESOPHAGOGASTRODUODENOSCOPY  10/20/2009   MFM:dfjoo HH/prepyloric antral ulcer s/p bx (+h.pylori), ERE   ESOPHAGOGASTRODUODENOSCOPY (EGD) WITH PROPOFOL  N/A 05/04/2021   Procedure: ESOPHAGOGASTRODUODENOSCOPY (EGD) WITH PROPOFOL ;  Surgeon: Cindie Carlin POUR, DO;  Location: [facility];  Service: Endoscopy;  Laterality: N/A;   INTRAUTERINE DEVICE INSERTION     LEEP N/A 02/14/2021   Procedure: LOOP ELECTROSURGICAL EXCISION PROCEDURE (LEEP);  Surgeon: Ozan, Jennifer, DO;  Location: [facility];  Service: Gynecology;  Laterality: N/A;   MASTECTOMY, PARTIAL Left    x 2   MOUTH SURGERY     All top teeth removed and 6-8 of bottom teeth removed   POLYPECTOMY  05/04/2021   Procedure: POLYPECTOMY;  Surgeon: Cindie Carlin POUR, DO;  Location:  [facility];  Service: Endoscopy;;   ROTATOR CUFF REPAIR     right   TRANSFORAMINAL LUMBAR INTERBODY FUSION W/ MIS 1 LEVEL N/A 03/25/2024   Procedure: LEFT LUMBAR FOUR-LUMBAR FIVE MINIMALLY INVASIVE TRANSFORAMINAL LUMBAR INTERBODY FUSION/DECOMPRESSION;  Surgeon: Debby Dorn MATSU, MD;  Location: [facility] OR;  Service: Neurosurgery;  Laterality: N/A;  LEFT LUMBAR FOUR-LUMBAR FIVE MINIMALLY INVASIVE TRANSFORAMINAL LUMBAR INTERBODY FUSION/DECOMPRESSION   TUBAL LIGATION      Family History  Problem Relation Age of Onset   Heart disease Other    Arthritis Other    Cancer Other    Asthma Other    Diabetes Other    Heart disease Mother    Diabetes Mother    Cancer Mother    Non-Hodgkin's lymphoma Mother    Hypertension Mother    Arthritis Mother    Lung cancer Father    Neuropathy Father    Hypertension Father     Social History   Socioeconomic History   Marital status: Widowed    Spouse name: Not on file   Number of children: Not on file   Years of education: 9   Highest education level: 9th grade  Occupational History   Occupation: conservation officer, nature  Tobacco Use   Smoking status: Every Day    Current packs/day: 0.50    Types: Cigarettes    Passive exposure: Current   Smokeless tobacco: Never   Tobacco comments:    15 cigarettes a day as of 11/06/23  Vaping Use   Vaping status: Never Used  Substance and Sexual Activity   Alcohol use: No   Drug use: Not Currently    Frequency: 7.0 times per week    Types: Marijuana    Comment: Quit marijuana 12/31/2022   Sexual activity: Yes    Birth control/protection: I.U.D.  Other Topics Concern   Not on file  Social History Narrative   Not on file   Social Drivers of Health   Financial Resource Strain: Low Risk  (03/09/2024)   Overall Financial Resource Strain (CARDIA)    Difficulty of Paying Living Expenses: Not very hard  Food Insecurity: No Food Insecurity (03/09/2024)   Hunger Vital Sign    Worried About Running Out of Food in the  Last Year: Never true    Ran Out of Food in the Last Year: Never true  Transportation Needs: No Transportation Needs (03/09/2024)   PRAPARE - Administrator, Civil Service (Medical): No    Lack of Transportation (Non-Medical): No  Physical Activity: Inactive (03/09/2024)   Exercise Vital Sign    Days of Exercise per Week: 0 days    Minutes of Exercise per Session: Not on file  Stress: Stress Concern Present (03/09/2024)   [employer] of Occupational Health - Occupational Stress Questionnaire    Feeling of Stress: To some extent  Social Connections: Moderately Isolated (03/09/2024)   Social Connection and Isolation Panel    Frequency of Communication with Friends and Family: More  than three times a week    Frequency of Social Gatherings with Friends and Family: Patient declined    Attends Religious Services: 1 to 4 times per year    Active Member of [employer] or Organizations: No    Attends Engineer, Structural: Not on file    Marital Status: Widowed  Intimate Partner Violence: Not At Risk (01/06/2024)   Humiliation, Afraid, Rape, and Kick questionnaire    Fear of Current or Ex-Partner: No    Emotionally Abused: No    Physically Abused: No    Sexually Abused: No    Outpatient Medications Prior to Visit  Medication Sig Dispense Refill   ALPRAZolam  (XANAX ) 0.5 MG tablet Take 1 tablet (0.5 mg total) by mouth 2 (two) times daily as needed for anxiety. 60 tablet 1   Aspirin-Acetaminophen -Caffeine (GOODYS EXTRA STRENGTH PO) Take 1 packet by mouth daily as needed (headache).     gabapentin  (NEURONTIN ) 300 MG capsule Take 300 mg by mouth 3 (three) times daily.     Glucose Blood (BLOOD GLUCOSE TEST STRIPS) STRP 1 each by In Vitro route in the morning, at noon, and at bedtime. May substitute to any manufacturer covered by patient's insurance. 100 strip 5   hydrochlorothiazide  (HYDRODIURIL ) 25 MG tablet TAKE 1 TABLET BY MOUTH ONCE A DAY. 30 tablet 5   metFORMIN   (GLUCOPHAGE -XR) 500 MG 24 hr tablet Take 1 tablet (500 mg total) by mouth daily with breakfast. 90 tablet 1   methocarbamol  (ROBAXIN ) 500 MG tablet Take 1 tablet (500 mg total) by mouth every 6 (six) hours as needed for muscle spasms. 120 tablet 1   pantoprazole  (PROTONIX ) 40 MG tablet TAKE (1) TABLET BY MOUTH TWICE DAILY BEFORE MEALS. (Patient taking differently: Take 40 mg by mouth 2 (two) times daily as needed (heartburn).) 60 tablet 0   rosuvastatin  (CRESTOR ) 5 MG tablet TAKE ONE TABLET BY MOUTH ONCE DAILY. 30 tablet 11   docusate sodium  (COLACE) 100 MG capsule Take 1 capsule (100 mg total) by mouth 2 (two) times daily. 60 capsule 0   DULoxetine  (CYMBALTA ) 30 MG capsule Take 1 capsule (30 mg total) by mouth daily. 30 capsule 1   oxyCODONE -acetaminophen  (PERCOCET/ROXICET) 5-325 MG tablet Take 2 tablets by mouth every 6 (six) hours as needed for severe pain (pain score 7-10). 60 tablet 0   No facility-administered medications prior to visit.    Allergies  Allergen Reactions   Bayer Aspirin [Aspirin] Other (See Comments)    Hx ulcers  OK to take Goody powder, rarely   Nsaids Other (See Comments)    Hx ulcers   Amoxicillin Hives    ROS Review of Systems  Constitutional:  Negative for chills and fever.  HENT:  Positive for hearing loss (Right sided). Negative for congestion, postnasal drip, sinus pressure, sinus pain and sore throat.   Eyes:  Negative for pain and discharge.  Respiratory:  Negative for cough and shortness of breath.   Cardiovascular:  Positive for leg swelling. Negative for chest pain and palpitations.  Gastrointestinal:  Negative for abdominal pain, diarrhea, nausea and vomiting.  Endocrine: Negative for polydipsia and polyuria.  Genitourinary:  Negative for dysuria and hematuria.  Musculoskeletal:  Positive for arthralgias, back pain, gait problem and neck pain. Negative for neck stiffness.  Skin:  Negative for rash.  Neurological:  Positive for weakness (B/l  hands) and numbness. Negative for dizziness, seizures, syncope, speech difficulty and light-headedness.  Psychiatric/Behavioral:  Positive for sleep disturbance. Negative for agitation and behavioral problems.  The patient is nervous/anxious.       Objective:    Physical Exam Vitals reviewed.  Constitutional:      General: She is not in acute distress.    Appearance: She is obese. She is not diaphoretic.  HENT:     Head: Normocephalic and atraumatic.     Nose: Nose normal. No congestion.     Mouth/Throat:     Mouth: Mucous membranes are moist.     Pharynx: No posterior oropharyngeal erythema.  Eyes:     General: No scleral icterus.    Extraocular Movements: Extraocular movements intact.  Neck:     Vascular: No carotid bruit.     Comments: Incision site C/D/I Cardiovascular:     Rate and Rhythm: Normal rate and regular rhythm.     Heart sounds: Normal heart sounds. No murmur heard. Pulmonary:     Breath sounds: Normal breath sounds. No wheezing or rales.  Musculoskeletal:        General: Tenderness (MCP joints of index and middle finger of left hand) present.     Cervical back: Neck supple. No tenderness.     Lumbar back: Tenderness present. Decreased range of motion. Positive left straight leg raise test. Negative right straight leg raise test.     Right lower leg: No edema.     Left lower leg: No edema.  Skin:    General: Skin is warm.     Findings: No rash.  Neurological:     General: No focal deficit present.     Mental Status: She is alert and oriented to person, place, and time.     Sensory: No sensory deficit.     Motor: Weakness (LUE - 4/5, LLE - 3/5) present.  Psychiatric:        Mood and Affect: Mood is anxious.        Behavior: Behavior is cooperative.     BP 126/79   Pulse (!) 107   Ht 5' 6 (1.676 m)   Wt 277 lb (125.6 kg)   LMP  (LMP Unknown)   SpO2 97%   BMI 44.71 kg/m  Wt Readings from Last 3 Encounters:  05/28/24 277 lb (125.6 kg)  03/25/24  274 lb (124.3 kg)  03/19/24 274 lb (124.3 kg)    Lab Results  Component Value Date   TSH 2.510 11/13/2022   Lab Results  Component Value Date   WBC 14.4 (H) 03/19/2024   HGB 13.5 03/19/2024   HCT 41.0 03/19/2024   MCV 91.3 03/19/2024   PLT 297 03/19/2024   Lab Results  Component Value Date   NA 138 03/19/2024   K 3.2 (L) 03/19/2024   CO2 25 03/19/2024   GLUCOSE 146 (H) 03/19/2024   BUN 22 (H) 03/19/2024   CREATININE 0.73 03/19/2024   BILITOT 0.3 01/15/2024   ALKPHOS 95 01/15/2024   AST 16 01/15/2024   ALT 18 01/15/2024   PROT 6.9 01/15/2024   ALBUMIN  3.8 01/15/2024   CALCIUM  9.5 03/19/2024   ANIONGAP 11 03/19/2024   EGFR 62 01/15/2024   Lab Results  Component Value Date   CHOL 155 11/13/2022   Lab Results  Component Value Date   HDL 29 (L) 11/13/2022   Lab Results  Component Value Date   LDLCALC 103 (H) 11/13/2022   Lab Results  Component Value Date   TRIG 125 11/13/2022   Lab Results  Component Value Date   CHOLHDL 5.3 (H) 11/13/2022   Lab Results  Component Value Date  HGBA1C 7.2 (H) 03/19/2024      Assessment & Plan:   Problem List Items Addressed This Visit       Endocrine   Type 2 diabetes mellitus with other specified complication (HCC) - Primary   Lab Results  Component Value Date   HGBA1C 7.2 (H) 03/19/2024   Uncontrolled due to diet noncompliance, check HbA1c Associated with HTN and HLD On metformin  500 mg QD She would benefit from GLP1 agonist therapy, but she prefers to improve her diet for now Advised to follow diabetic diet On statin F/u CMP and lipid panel Diabetic eye exam: Advised to follow up with Ophthalmology for diabetic eye exam      Relevant Orders   Microalbumin / creatinine urine ratio   CMP14+EGFR   Hemoglobin A1c     Nervous and Auditory   Cervical spondylosis with radiculopathy   S/p cervical spine surgery in 06/25 Has neurosurgery follow-up Celebrex  as needed for mild to moderate pain Has oxycodone   10-325 mg q6h as needed for severe pain, discussed about reducing dose for now to 10-325 mg q8h PRN, referred to [facility] pain clinic On Gabapentin  600 mg TID      Relevant Medications   DULoxetine  (CYMBALTA ) 60 MG capsule     Musculoskeletal and Integument   DDD (degenerative disc disease), lumbar   Has chronic low back pain, history of lumbar spine surgeries On gabapentin  600 mg 3 times daily Flexeril  as needed for muscle spasms S/p lumbar fusion surgery in 09/25 Has oxycodone  10-325 mg q6h as needed for severe pain, discussed about reducing dose for now to 10-325 mg q8h PRN, referred to [facility] pain clinic       Relevant Medications   oxyCODONE -acetaminophen  (PERCOCET) 10-325 MG tablet (Start on 06/04/2024)   Other Relevant Orders   Ambulatory referral to Pain Clinic     Other   GAD (generalized anxiety disorder) (Chronic)   Uncontrolled currently due to recent physical stressors Increased dose of Cymbalta  to 60 mg QD Has Xanax  to 0.5 mg BID PRN Advised to avoid marijuana products - has quit now      Relevant Medications   DULoxetine  (CYMBALTA ) 60 MG capsule   Mixed hyperlipidemia (Chronic)   On Crestor  5 mg QD Follow DASH diet      Relevant Orders   Lipid Profile   Moderate episode of recurrent major depressive disorder (HCC)   Uncontrolled due to recent physical stressors Has crying spells and anxiety as well Had lengthy discussion about coping mechanisms Increased dose of Cymbalta  to 60 mg QD, considering her neuropathy symptoms as well      Relevant Medications   DULoxetine  (CYMBALTA ) 60 MG capsule   Other Relevant Orders   CBC with Differential/Platelet   CMP14+EGFR   Other Visit Diagnoses       Encounter for immunization       Relevant Orders   Pneumococcal conjugate vaccine 20-valent (Completed)       Meds ordered this encounter  Medications   oxyCODONE -acetaminophen  (PERCOCET) 10-325 MG tablet    Sig: Take 1 tablet by mouth every 8 (eight)  hours as needed for pain.    Dispense:  45 tablet    Refill:  0   DULoxetine  (CYMBALTA ) 60 MG capsule    Sig: Take 1 capsule (60 mg total) by mouth daily.    Dispense:  90 capsule    Refill:  1    Follow-up: Return in about 3 months (around 08/28/2024) for DM and chronic pain.  Suzzane MARLA Blanch, MD

## 2024-05-28 NOTE — Assessment & Plan Note (Signed)
 S/p cervical spine surgery in 06/25 Has neurosurgery follow-up Celebrex  as needed for mild to moderate pain Has oxycodone  10-325 mg q6h as needed for severe pain, discussed about reducing dose for now to 10-325 mg q8h PRN, referred to [facility] pain clinic On Gabapentin  600 mg TID

## 2024-05-28 NOTE — Assessment & Plan Note (Signed)
 On Crestor  5 mg QD Follow DASH diet

## 2024-05-28 NOTE — Patient Instructions (Addendum)
 Please start taking Cymbalta  60 mg once daily instead of 30 mg.  Please take Percocet 10-325 mg one tablet as needed up to 3 times in a day.  Please continue to take medications as prescribed.  Please continue to follow low carb diet and perform moderate exercise/walking as tolerated.

## 2024-05-28 NOTE — Assessment & Plan Note (Addendum)
 Has chronic low back pain, history of lumbar spine surgeries On gabapentin  600 mg 3 times daily Flexeril  as needed for muscle spasms S/p lumbar fusion surgery in 09/25 Has oxycodone  10-325 mg q6h as needed for severe pain, discussed about reducing dose for now to 10-325 mg q8h PRN, referred to [facility] pain clinic

## 2024-05-29 ENCOUNTER — Telehealth: Payer: Self-pay

## 2024-05-29 ENCOUNTER — Ambulatory Visit: Payer: Self-pay | Admitting: Internal Medicine

## 2024-05-29 LAB — CMP14+EGFR
ALT: 25 IU/L (ref 0–32)
AST: 29 IU/L (ref 0–40)
Albumin: 4.2 g/dL (ref 3.8–4.9)
Alkaline Phosphatase: 123 IU/L (ref 49–135)
BUN/Creatinine Ratio: 22 (ref 9–23)
BUN: 22 mg/dL (ref 6–24)
Bilirubin Total: 0.4 mg/dL (ref 0.0–1.2)
CO2: 22 mmol/L (ref 20–29)
Calcium: 10 mg/dL (ref 8.7–10.2)
Chloride: 96 mmol/L (ref 96–106)
Creatinine, Ser: 0.98 mg/dL (ref 0.57–1.00)
Globulin, Total: 3.6 g/dL (ref 1.5–4.5)
Glucose: 173 mg/dL — ABNORMAL HIGH (ref 70–99)
Potassium: 4.2 mmol/L (ref 3.5–5.2)
Sodium: 137 mmol/L (ref 134–144)
Total Protein: 7.8 g/dL (ref 6.0–8.5)
eGFR: 68 mL/min/1.73 (ref >59)

## 2024-05-29 LAB — LIPID PANEL
Chol/HDL Ratio: 4.8 ratio — ABNORMAL HIGH (ref 0.0–4.4)
Cholesterol, Total: 133 mg/dL (ref 100–199)
HDL: 28 mg/dL — ABNORMAL LOW (ref >39)
LDL Chol Calc (NIH): 74 mg/dL (ref 0–99)
Triglycerides: 179 mg/dL — ABNORMAL HIGH (ref 0–149)
VLDL Cholesterol Cal: 31 mg/dL (ref 5–40)

## 2024-05-29 LAB — CBC WITH DIFFERENTIAL/PLATELET
Basophils Absolute: 0.1 x10E3/uL (ref 0.0–0.2)
Basos: 1 %
EOS (ABSOLUTE): 0.3 x10E3/uL (ref 0.0–0.4)
Eos: 2 %
Hematocrit: 44.3 % (ref 34.0–46.6)
Hemoglobin: 14 g/dL (ref 11.1–15.9)
Immature Grans (Abs): 0.1 x10E3/uL (ref 0.0–0.1)
Immature Granulocytes: 1 %
Lymphocytes Absolute: 3.8 x10E3/uL — ABNORMAL HIGH (ref 0.7–3.1)
Lymphs: 26 %
MCH: 28.9 pg (ref 26.6–33.0)
MCHC: 31.6 g/dL (ref 31.5–35.7)
MCV: 91 fL (ref 79–97)
Monocytes Absolute: 1.2 x10E3/uL — ABNORMAL HIGH (ref 0.1–0.9)
Monocytes: 8 %
Neutrophils Absolute: 9.5 x10E3/uL — ABNORMAL HIGH (ref 1.4–7.0)
Neutrophils: 62 %
Platelets: 336 x10E3/uL (ref 150–450)
RBC: 4.85 x10E6/uL (ref 3.77–5.28)
RDW: 13.3 % (ref 11.7–15.4)
WBC: 14.9 x10E3/uL — ABNORMAL HIGH (ref 3.4–10.8)

## 2024-05-29 LAB — HEMOGLOBIN A1C
Est. average glucose Bld gHb Est-mCnc: 177 mg/dL
Hgb A1c MFr Bld: 7.8 % — ABNORMAL HIGH (ref 4.8–5.6)

## 2024-05-29 NOTE — Telephone Encounter (Signed)
 Copied from CRM [id number]. Topic: Clinical - Lab/Test Results >> May 29, 2024  9:06 AM Montie POUR wrote: Reason for CRM:  I gave Brenda Taylor her lab results and she would like a nurse to call her to discuss. Her number is [phone]. Thanks

## 2024-05-30 LAB — MICROALBUMIN / CREATININE URINE RATIO
Creatinine, Urine: 115.1 mg/dL
Microalb/Creat Ratio: 12 mg/g{creat} (ref 0–29)
Microalbumin, Urine: 13.5 ug/mL

## 2024-06-01 ENCOUNTER — Ambulatory Visit: Payer: Self-pay

## 2024-06-01 ENCOUNTER — Telehealth: Admitting: Internal Medicine

## 2024-06-01 ENCOUNTER — Encounter: Payer: Self-pay | Admitting: Internal Medicine

## 2024-06-01 DIAGNOSIS — H65191 Other acute nonsuppurative otitis media, right ear: Secondary | ICD-10-CM | POA: Diagnosis not present

## 2024-06-01 MED ORDER — OFLOXACIN 0.3 % OT SOLN
5.0000 [drp] | Freq: Every day | OTIC | 0 refills | Status: AC
Start: 2024-06-01 — End: ?

## 2024-06-01 MED ORDER — AZITHROMYCIN 250 MG PO TABS: ORAL_TABLET | ORAL | 0 refills | Status: AC

## 2024-06-01 NOTE — Patient Instructions (Signed)
 Please start taking Azithromycin  as prescribed.  Please apply Ofloxacin ear drop as prescribed.  Please do not use any sharp objects for cleaning purposes.

## 2024-06-01 NOTE — Telephone Encounter (Signed)
 FYI Only or Action Required?: Action required by provider: clinical question for provider.  Patient was last seen in primary care on 05/28/2024 by Tobie Suzzane POUR, MD.  Called Nurse Triage reporting Ear Drainage.  Symptoms began several days ago.  Interventions attempted: Nothing.  Symptoms are: unchanged.  Triage Disposition: See Physician Within 24 Hours  Patient/caregiver understands and will follow disposition?: No, wishes to speak with PCP    Copied from CRM #[id number]. Topic: Clinical - Red Word Triage >> Jun 01, 2024  9:54 AM Winona R wrote: Right ear pain and drainage. Pt states it looks like push r something draining out. The pain started Thursday but started to get worst staring Friday. the Dr. Alyn at her ears in office on the 6th but stated he didn't see anything concerning. Pt also return call for labs in which I read to her. Pt is curious why her  HbA1c may be high if she's already taking metformin  and following a low carb diet. Reason for Disposition  Ear pain  Answer Assessment - Initial Assessment Questions Pt states that he saw her on Thursday for a follow up and she told him then she heard clicking noise in her left ear. She states he told her it was likely fluid she was hearing but her right ear looked good. She states Friday, she began to have drainage coming from her ear with severe pain. She states she also wants him to know that she is taking her metformin  daily and follows a low carb diet. She states yesterday fasting she was 261, then in the evening she was 276, then at bedtime she was still over 200. She states her phone is dying and she is going to plug it in but if she doesn't answer, a VM can be left.   RN did attempt to schedule pt. She states she could only do virtual due to transportation. RN explained why an in person appt would be needed. She states that Dr. Tobie is aware of her situation and knows she can't drive. She again requested a virtual. She  states she can show him the cotton balls if needed.     1. LOCATION: Which ear is involved?   Right ear  2. COLOR: What is the color of the discharge?      Yellow pus 3. CONSISTENCY: How runny is the discharge? Could it be water ?      pus 4. ONSET: When did you first notice the discharge?     Friday 5. PAIN: Is there any earache? How bad is it?  (Scale 0-10; none, mild, moderate or severe)     Throbbing- 9/10  7. OTHER SYMPTOMS: Do you have any other symptoms? (e.g., headache, fever, dizziness, vomiting, runny nose)     Eye acts like its going to swell shut  Protocols used: Ear - Discharge-A-AH

## 2024-06-01 NOTE — Assessment & Plan Note (Addendum)
 Discharge likely mucoid, likely has acute otitis media Started empiric azithromycin  (allergic to amoxicillin) Ofloxacin eardrops prescribed Advised to contact if her symptoms are not improving after 3 days - may need ENT evaluation

## 2024-06-01 NOTE — Telephone Encounter (Signed)
 scheduled

## 2024-06-01 NOTE — Progress Notes (Signed)
 Virtual Visit via Video Note   Because of Libra Gatz Brinkmeyer's co-morbid illnesses, she is at least at moderate risk for complications without adequate follow up.  This format is felt to be most appropriate for this patient at this time.  All issues noted in this document were discussed and addressed.  A limited physical exam was performed with this format.      Evaluation Performed:  Follow-up visit  Date:  06/01/2024   ID:  Brenda Taylor, DOB July 05, 1971, MRN [id number]  Patient Location: Home Provider Location: Office/Clinic  Participants: Patient Location of Patient: Home Location of Provider: Telehealth Consent was obtain for visit to be over via telehealth. I verified that I am speaking with the correct person using two identifiers.  PCP:  Tobie Suzzane POUR, MD   Chief Complaint: Right ear pain and discharge  History of Present Illness:    Brenda Taylor is a 53 y.o. female who has a video visit for complaint of right ear pain and discharge for the last 3 days.  She also reports left-sided facial pain and headache.  She has chills, but denies fever.  She has to change cotton plug every 4 hours due to yellowish discharge from the right ear.  Denies any symptom in the left ear.  The patient does not have symptoms concerning for COVID-19 infection (fever, chills, cough, or new shortness of breath).   Past Medical, Surgical, Social History, Allergies, and Medications have been Reviewed.  Past Medical History:  Diagnosis Date   Anxiety    Arthritis    Chronic pain    DDD (degenerative disc disease)    Depression    Diabetes mellitus without complication (HCC)    GERD (gastroesophageal reflux disease)    HBP (high blood pressure)    Headache    Hernia    History of hiatal hernia    Multiple gastric ulcers    Neuromuscular disorder (HCC)    Neuropathy bilateral feet and hands   Rotator cuff syndrome of left shoulder 10/09/2011   Ulcer disease    Past Surgical History:   Procedure Laterality Date   ANTERIOR CERVICAL DECOMP/DISCECTOMY FUSION N/A 12/03/2023   Procedure: ANTERIOR CERVICAL DECOMPRESSION/DISCECTOMY FUSION CERVICAL THREE-FOUR CERVICAL FOUR-FIVE CERVICAL FIVE-SIX EXPLORE FUSION SIX-SEVEN;  Surgeon: Debby Dorn MATSU, MD;  Location: [facility] OR;  Service: Neurosurgery;  Laterality: N/A;  3C   APPENDECTOMY     BACK SURGERY     x5   BIOPSY  05/04/2021   Procedure: BIOPSY;  Surgeon: Cindie Carlin POUR, DO;  Location: [facility];  Service: Endoscopy;;   CARPAL TUNNEL RELEASE     bilateral   CHOLECYSTECTOMY     COLONOSCOPY  10/20/2009   MFM:qmpjaoz anal canal/left-side diverticula and multiple polyps (hyperplastic). poor prep compromised exam. Next TCS 09/2014.   COLONOSCOPY WITH PROPOFOL  N/A 05/04/2021   Procedure: COLONOSCOPY WITH PROPOFOL ;  Surgeon: Cindie Carlin POUR, DO;  Location: [facility];  Service: Endoscopy;  Laterality: N/A;  9:45am   ESOPHAGOGASTRODUODENOSCOPY  03/06/2010   MFM:qnlm quadrant distal esophageal/patent tubular esophagus/small HH, antral erosions and ulcerations. Bx negative for persistent H.pylori   ESOPHAGOGASTRODUODENOSCOPY  10/20/2009   MFM:dfjoo HH/prepyloric antral ulcer s/p bx (+h.pylori), ERE   ESOPHAGOGASTRODUODENOSCOPY (EGD) WITH PROPOFOL  N/A 05/04/2021   Procedure: ESOPHAGOGASTRODUODENOSCOPY (EGD) WITH PROPOFOL ;  Surgeon: Cindie Carlin POUR, DO;  Location: [facility];  Service: Endoscopy;  Laterality: N/A;   INTRAUTERINE DEVICE INSERTION     LEEP N/A 02/14/2021   Procedure:  LOOP ELECTROSURGICAL EXCISION PROCEDURE (LEEP);  Surgeon: Ozan, Jennifer, DO;  Location: [facility];  Service: Gynecology;  Laterality: N/A;   MASTECTOMY, PARTIAL Left    x 2   MOUTH SURGERY     All top teeth removed and 6-8 of bottom teeth removed   POLYPECTOMY  05/04/2021   Procedure: POLYPECTOMY;  Surgeon: Cindie Carlin POUR, DO;  Location: [facility];  Service: Endoscopy;;   ROTATOR CUFF REPAIR     right   TRANSFORAMINAL LUMBAR INTERBODY  FUSION W/ MIS 1 LEVEL N/A 03/25/2024   Procedure: LEFT LUMBAR FOUR-LUMBAR FIVE MINIMALLY INVASIVE TRANSFORAMINAL LUMBAR INTERBODY FUSION/DECOMPRESSION;  Surgeon: Debby Dorn MATSU, MD;  Location: [facility];  Service: Neurosurgery;  Laterality: N/A;  LEFT LUMBAR FOUR-LUMBAR FIVE MINIMALLY INVASIVE TRANSFORAMINAL LUMBAR INTERBODY FUSION/DECOMPRESSION   TUBAL LIGATION       Current Meds  Medication Sig   ALPRAZolam  (XANAX ) 0.5 MG tablet Take 1 tablet (0.5 mg total) by mouth 2 (two) times daily as needed for anxiety.   Aspirin-Acetaminophen -Caffeine (GOODYS EXTRA STRENGTH PO) Take 1 packet by mouth daily as needed (headache).   azithromycin  (ZITHROMAX ) 250 MG tablet Take 2 tablets on day 1, then 1 tablet daily on days 2 through 5   DULoxetine  (CYMBALTA ) 60 MG capsule Take 1 capsule (60 mg total) by mouth daily.   gabapentin  (NEURONTIN ) 300 MG capsule Take 300 mg by mouth 3 (three) times daily.   Glucose Blood (BLOOD GLUCOSE TEST STRIPS) STRP 1 each by In Vitro route in the morning, at noon, and at bedtime. May substitute to any manufacturer covered by patient's insurance.   hydrochlorothiazide  (HYDRODIURIL ) 25 MG tablet TAKE 1 TABLET BY MOUTH ONCE A DAY.   metFORMIN  (GLUCOPHAGE -XR) 500 MG 24 hr tablet Take 1 tablet (500 mg total) by mouth daily with breakfast.   methocarbamol  (ROBAXIN ) 500 MG tablet Take 1 tablet (500 mg total) by mouth every 6 (six) hours as needed for muscle spasms.   ofloxacin (FLOXIN) 0.3 % OTIC solution Place 5 drops into the right ear daily.   [START ON 06/04/2024] oxyCODONE -acetaminophen  (PERCOCET) 10-325 MG tablet Take 1 tablet by mouth every 8 (eight) hours as needed for pain.   pantoprazole  (PROTONIX ) 40 MG tablet TAKE (1) TABLET BY MOUTH TWICE DAILY BEFORE MEALS. (Patient taking differently: Take 40 mg by mouth 2 (two) times daily as needed (heartburn).)   rosuvastatin  (CRESTOR ) 5 MG tablet TAKE ONE TABLET BY MOUTH ONCE DAILY.     Allergies:   Bayer aspirin [aspirin], Nsaids,  and Amoxicillin   ROS:   Please see the history of present illness. All other systems reviewed and are negative.   Labs/Other Tests and Data Reviewed:    Recent Labs: 01/02/2024: Magnesium 1.9 05/28/2024: ALT 25; BUN 22; Creatinine, Ser 0.98; Hemoglobin 14.0; Platelets 336; Potassium 4.2; Sodium 137   Recent Lipid Panel Lab Results  Component Value Date/Time   CHOL 133 05/28/2024 08:55 AM   TRIG 179 (H) 05/28/2024 08:55 AM   HDL 28 (L) 05/28/2024 08:55 AM   CHOLHDL 4.8 (H) 05/28/2024 08:55 AM   LDLCALC 74 05/28/2024 08:55 AM    Wt Readings from Last 3 Encounters:  05/28/24 277 lb (125.6 kg)  03/25/24 274 lb (124.3 kg)  03/19/24 274 lb (124.3 kg)     Objective:    Vital Signs:  LMP  (LMP Unknown)    VITAL SIGNS:  reviewed GEN:  no acute distress EYES:  sclerae anicteric, EOMI - Extraocular Movements Intact RESPIRATORY:  normal respiratory effort, symmetric expansion  NEURO:  alert and oriented x 3, no obvious focal deficit PSYCH:  normal affect  ASSESSMENT & PLAN:    Assessment & Plan Acute mucoid otitis media of right ear Discharge likely mucoid, likely has acute otitis media Started empiric azithromycin  (allergic to amoxicillin) Ofloxacin eardrops prescribed Advised to contact if her symptoms are not improving after 3 days - may need ENT evaluation    I discussed the assessment and treatment plan with the patient. The patient was provided an opportunity to ask questions, and all were answered. The patient agreed with the plan and demonstrated an understanding of the instructions.   The patient was advised to call back or seek an in-person evaluation if the symptoms worsen or if the condition fails to improve as anticipated.  The above assessment and management plan was discussed with the patient. The patient verbalized understanding of and has agreed to the management plan.   Medication Adjustments/Labs and Tests Ordered: Current medicines are reviewed at  length with the patient today.  Concerns regarding medicines are outlined above.   Tests Ordered: No orders of the defined types were placed in this encounter.   Medication Changes: Meds ordered this encounter  Medications   ofloxacin (FLOXIN) 0.3 % OTIC solution    Sig: Place 5 drops into the right ear daily.    Dispense:  5 mL    Refill:  0   azithromycin  (ZITHROMAX ) 250 MG tablet    Sig: Take 2 tablets on day 1, then 1 tablet daily on days 2 through 5    Dispense:  6 tablet    Refill:  0     Note: This dictation was prepared with Dragon dictation along with smaller phrase technology. Similar sounding words can be transcribed inadequately or may not be corrected upon review. Any transcriptional errors that result from this process are unintentional.      Disposition:  Follow up  Signed, Suzzane MARLA Blanch, MD  06/01/2024 3:21 PM     [facility]

## 2024-06-03 ENCOUNTER — Ambulatory Visit (HOSPITAL_COMMUNITY): Attending: Surgery | Admitting: Physical Therapy

## 2024-06-03 DIAGNOSIS — M5416 Radiculopathy, lumbar region: Secondary | ICD-10-CM | POA: Insufficient documentation

## 2024-06-03 DIAGNOSIS — R262 Difficulty in walking, not elsewhere classified: Secondary | ICD-10-CM | POA: Insufficient documentation

## 2024-06-03 DIAGNOSIS — M6281 Muscle weakness (generalized): Secondary | ICD-10-CM | POA: Diagnosis present

## 2024-06-03 DIAGNOSIS — M545 Low back pain, unspecified: Secondary | ICD-10-CM | POA: Insufficient documentation

## 2024-06-03 NOTE — Therapy (Addendum)
 OUTPATIENT PHYSICAL THERAPY THORACOLUMBAR TREATMENT  PHYSICAL THERAPY DISCHARGE SUMMARY  Visits from Start of Care: 1  Current functional level related to goals / functional outcomes: Please see below, pt has not returned. 3 no shows   Remaining deficits: Please see below, pt has not returned. 3 no shows   Education / Equipment: Please see below, pt has not returned. 3 no shows   Patient agrees to discharge. Patient goals were Please see below, pt has not returned. 3 no shows. Patient is being discharged due to Please see below, pt has not returned. 3 no shows.  Patient Name: Brenda Taylor MRN: [id number] DOB:01/03/1971, 53 y.o., female Today's Date: 06/10/2024  END OF SESSION:     Past Medical History:  Diagnosis Date   Anxiety    Arthritis    Chronic pain    DDD (degenerative disc disease)    Depression    Diabetes mellitus without complication (HCC)    GERD (gastroesophageal reflux disease)    HBP (high blood pressure)    Headache    Hernia    History of hiatal hernia    Multiple gastric ulcers    Neuromuscular disorder (HCC)    Neuropathy bilateral feet and hands   Rotator cuff syndrome of left shoulder 10/09/2011   Ulcer disease    Past Surgical History:  Procedure Laterality Date   ANTERIOR CERVICAL DECOMP/DISCECTOMY FUSION N/A 12/03/2023   Procedure: ANTERIOR CERVICAL DECOMPRESSION/DISCECTOMY FUSION CERVICAL THREE-FOUR CERVICAL FOUR-FIVE CERVICAL FIVE-SIX EXPLORE FUSION SIX-SEVEN;  Surgeon: Debby Dorn MATSU, MD;  Location: [facility] OR;  Service: Neurosurgery;  Laterality: N/A;  3C   APPENDECTOMY     BACK SURGERY     x5   BIOPSY  05/04/2021   Procedure: BIOPSY;  Surgeon: Cindie Carlin POUR, DO;  Location: [facility];  Service: Endoscopy;;   CARPAL TUNNEL RELEASE     bilateral   CHOLECYSTECTOMY     COLONOSCOPY  10/20/2009   MFM:qmpjaoz anal canal/left-side diverticula and multiple polyps (hyperplastic). poor prep compromised exam. Next TCS 09/2014.    COLONOSCOPY WITH PROPOFOL  N/A 05/04/2021   Procedure: COLONOSCOPY WITH PROPOFOL ;  Surgeon: Cindie Carlin POUR, DO;  Location: [facility];  Service: Endoscopy;  Laterality: N/A;  9:45am   ESOPHAGOGASTRODUODENOSCOPY  03/06/2010   MFM:qnlm quadrant distal esophageal/patent tubular esophagus/small HH, antral erosions and ulcerations. Bx negative for persistent H.pylori   ESOPHAGOGASTRODUODENOSCOPY  10/20/2009   MFM:dfjoo HH/prepyloric antral ulcer s/p bx (+h.pylori), ERE   ESOPHAGOGASTRODUODENOSCOPY (EGD) WITH PROPOFOL  N/A 05/04/2021   Procedure: ESOPHAGOGASTRODUODENOSCOPY (EGD) WITH PROPOFOL ;  Surgeon: Cindie Carlin POUR, DO;  [facility]: [facility];  Service: Endoscopy;  Laterality: N/A;   INTRAUTERINE DEVICE INSERTION     LEEP N/A 02/14/2021   Procedure: LOOP ELECTROSURGICAL EXCISION PROCEDURE (LEEP);  Surgeon: Ozan, Jennifer, DO;  Location: [facility];  Service: Gynecology;  Laterality: N/A;   MASTECTOMY, PARTIAL Left    x 2   MOUTH SURGERY     All top teeth removed and 6-8 of bottom teeth removed   POLYPECTOMY  05/04/2021   Procedure: POLYPECTOMY;  Surgeon: Cindie Carlin POUR, DO;  [facility]: [facility];  Service: Endoscopy;;   ROTATOR CUFF REPAIR     right   TRANSFORAMINAL LUMBAR INTERBODY FUSION W/ MIS 1 LEVEL N/A 03/25/2024   Procedure: LEFT LUMBAR FOUR-LUMBAR FIVE MINIMALLY INVASIVE TRANSFORAMINAL LUMBAR INTERBODY FUSION/DECOMPRESSION;  Surgeon: Debby Dorn MATSU, MD;  Location: [facility] OR;  Service: Neurosurgery;  Laterality: N/A;  LEFT LUMBAR FOUR-LUMBAR FIVE MINIMALLY INVASIVE TRANSFORAMINAL LUMBAR INTERBODY FUSION/DECOMPRESSION  TUBAL LIGATION     Patient Active Problem List   Diagnosis Date Noted   Acute mucoid otitis media of right ear 06/01/2024   Chronic left-sided lumbar radiculopathy 03/25/2024   Moderate episode of recurrent major depressive disorder (HCC) 01/17/2024   Peritonsillar abscess 12/30/2023   Cervical spinal stenosis 12/03/2023   Preop examination 11/06/2023    Cervical spondylosis with radiculopathy 04/24/2023   Hot flashes 04/24/2023   DDD (degenerative disc disease), lumbar 02/07/2023   Hospital discharge follow-up 02/07/2023   Syncope 01/27/2023   Central cord syndrome (HCC) 01/27/2023   Allergic conjunctivitis of both eyes 01/02/2023   Meralgia paresthetica of left side 01/01/2023   Type 2 diabetes mellitus with other specified complication (HCC) 11/14/2022   Plantar wart of both feet 11/13/2022   Simple chronic bronchitis (HCC) 11/13/2022   Acute sinusitis 09/13/2022   Sciatica 04/30/2022   Primary osteoarthritis of hands, bilateral 03/06/2022   Gastroesophageal reflux disease without esophagitis 03/06/2022   Restless legs syndrome (RLS) 07/18/2021   Intertrigo 05/30/2021   Early satiety 04/10/2021   Encounter for general adult medical examination with abnormal findings 12/15/2020   Mixed hyperlipidemia 12/15/2020   Vitamin D  deficiency 09/15/2020   Osteoarthritis of right foot 07/05/2020   History of fusion of cervical spine 06/28/2020   Arthritis 06/01/2020   Diabetic neuropathy (HCC) 06/01/2020   Hearing problem of right ear 05/02/2020   Obesity (BMI 30-39.9) 05/02/2020   Tobacco abuse 05/02/2020   Prediabetes 05/02/2020   GAD (generalized anxiety disorder) 05/07/2019   Hypertension 04/23/2019   HNP (herniated nucleus pulposus), lumbar 07/03/2013   Chronic idiopathic constipation 10/31/2012    PCP: Tobie Suzzane POUR, MD  REFERRING PROVIDER: Johnanna Camie Mates, PA-C  REFERRING DIAG: [phone] (ICD-10-CM) - Other spondylosis with radiculopathy, lumbar region  Rationale for Evaluation and Treatment: Rehabilitation  THERAPY DIAG:  Radiculopathy, lumbar region  Low back pain, unspecified back pain laterality, unspecified chronicity, unspecified whether sciatica present  Difficulty in walking, not elsewhere classified  Muscle weakness (generalized)  ONSET DATE: s/p lumbar fusion 03/25/24; cervical fusion in May of this  year  SUBJECTIVE:                                                                                                                                                                                           SUBJECTIVE STATEMENT: Pt 15 minutes late for appt today.  Comes with SPC and reports doing some of the exercises.  Missed the last 2 appt due to transportation as she depends on her daughter that works to get her here.   Pt reports she has an appointment with [facility] tomorrow  for her Lt foot drop.  States she asked the MD about aquatic therapy and states she is against that at this time. Pt reports she can't do much with her Lt side of her body, especially lifting her leg.  Eval: Chronic pain in the back for about 20 years; multiple discectomies and back surgeries; neck fusion in May last 2 surgeries by Dr. Debby.  Had some therapy after neck surgery; continued with left foot drop.  Foot drop started April 19th; leg is still numb, tingling and burning.  Feels like her back pain is just as bad.  Arrives with [facility] but needs HHA to assist; needs assist with bathing and dressing. Wearing back brace; states she has gained a lot of weight due to being unable to move and is depressed.    PERTINENT HISTORY:  Multiple back surgeries  Pt is a 53 y.o female admitted 9/3 for scheduled L4-5 lumbar interbody fusion. Recently s/p ACDF C3-7 11/2023. PMH: DDD, central cord syndrome, DMII, osteoarthritis, HLD, neuropathy, HTN, back surgery x5.  Brenda Taylor is a 53 y.o. female with history of multiple lumbar microdiscectomies who underwent elective L4-5 TLIF for severe lumbar radiculopathy with foot drop.  Postoperatively, the patient was mobilized with the help PT, pain was well-controlled with oral meds. Patient reported improved ambulation ability postop. Patient was deemed ready for discharge.    Treatments: Surgery -L4-5 MIS TLIF  PAIN:  Are you having pain? Yes: NPRS scale: 7-9/10 back pain and down left  leg Pain location: back and down to left foot Pain description: hypersensitive, dull achy pain in back Aggravating factors: standing and walking Relieving factors: sitting, pain medicine;   PRECAUTIONS: Fall   WEIGHT BEARING RESTRICTIONS: No  FALLS:  Has patient fallen in last 6 months? 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  DIAGNOSTIC FINDINGS:  MPRESSION: 1. Advanced chronic lower lumbar degeneration with chronic vacuum L3-L4 through L5-S1. With regard to left lower extremity radiating pain the symptomatic level(s) might be: - L3-L4 where severe left foraminal stenosis is related to bulky chronic left foraminal and far lateral disc herniation (series 106, image 23). Query left L3 radiculitis. Moderate multifactorial spinal stenosis at this level appears stable. - L4-L5 where moderate to severe left lateral recess is related to caudal migrated disc material. Query left L5 radiculitis. Moderate biforaminal stenosis here is stable, L4 nerve levels. And no spinal stenosis at this level following laminectomy.   2. Chronic L5-S1 degeneration with moderate right lateral recess stenosis, moderate to severe left greater than right foraminal stenosis.   3. Moderate L2-L3 spinal stenosis appears progressed from the noncontrast MRI earlier this year, perhaps related to increased mild epidural lipomatosis. Chronic asymmetric and bulky right foraminal disc with moderate to severe right L2 neural  foraminal stenosis.   4. L1-L2 mild spinal stenosis just below the tip of the conus medullaris is stable, although up to moderate left lateral recess stenosis there appears progressed, descending left L2 nerve level.     Electronically Signed   By: VEAR  Shona M.D.   On: 03/01/2024 12:38CLINICAL DATA:  Elective surgery.   EXAM: LUMBAR SPINE - 2-3 VIEW   COMPARISON:  Preoperative imaging   FINDINGS: Two fluoroscopic spot views of the lumbar spine submitted from the operating room, as well as 3D intraoperative images. Pedicle screws at L4 and L5 with interbody spacer. Fluoroscopy time 56 seconds. Dose 86.85 mGy.   IMPRESSION: Intraoperative fluoroscopy during lumbar fusion.     Electronically Signed   By: Andrea Gasman M.D.   On: 03/25/2024 16:59    PATIENT SURVEYS:  Modified Oswestry:  MODIFIED OSWESTRY DISABILITY SCALE  Date: 05/01/2024 Score  Total 35/50; 70%   Interpretation of scores: Score Category Description  0-20% Minimal Disability The patient can cope with most living activities. Usually no treatment is indicated apart from advice on lifting, sitting and exercise  21-40% Moderate Disability The patient experiences more pain and difficulty with sitting, lifting and standing. Travel and social life are more difficult and they may be disabled from work. Personal care, sexual activity and sleeping are not grossly affected, and the patient can usually be managed by conservative means  41-60% Severe Disability Pain remains the main problem in this group, but activities of daily living are affected. These patients require a detailed investigation  61-80% Crippled Back pain impinges on all aspects of the patient's life. Positive intervention is required  81-100% Bed-bound These patients are either bed-bound or exaggerating their symptoms  Bluford FORBES Zoe DELENA Karon DELENA, et al. Surgery versus conservative management of stable thoracolumbar fracture: the PRESTO feasibility  RCT. [place] ([place]): [employer]; 2021 Nov. [facility] Technology Assessment, No. 25.62.) Appendix 3, Oswestry Disability Index category descriptors. Available from: Findjewelers.cz  Minimally Clinically Important Difference (MCID) = 12.8%  COGNITION: Overall cognitive status: Within functional limits for tasks assessed     SENSATION: Reports N/T left leg  MUSCLE LENGTH: Hamstrings:  test next visit  POSTURE: rounded shoulders, forward head, and flexed trunk   PALPATION:    LOWER EXTREMITY MMT:    MMT Right eval Left eval  Hip flexion 4+ 3-  Hip extension    Hip abduction    Hip adduction    Hip internal rotation    Hip external rotation    Knee flexion    Knee extension 5 3-  Ankle dorsiflexion 5 2+  Ankle plantarflexion    Ankle inversion    Ankle eversion     (Blank rows = not tested)  FUNCTIONAL TESTS:  2 minute walk test: 64 ft very antalgic drop foot on left; needs cGA, HHA  GAIT: Distance walked: 64 ft Assistive device utilized: Single point cane; uses a rollator at home Level of assistance: CGA and Min A Comments: left drop foot  TREATMENT DATE 06/03/24 Sit to stands with UE assist 5X Supine:  (used logroll technique)  HEP review; TA brace, seated exercises (unable on Lt)  Bridge, no rise just iso contraction 10X5   Heelslides 10X Rt, 5X AAROM Lt Goal review, POC, NS policy, exercise compliance.    05/01/24 physical therapy evaluation and HEP instruction  PATIENT EDUCATION:  Education details: Patient educated on exam findings, POC, scope of PT, HEP, and discussed aquatic therapy but patient does not drive that far as far as [place]. Person educated: Patient Education method: Explanation, Demonstration, and Handouts Education comprehension: verbalized understanding, returned  demonstration, verbal cues required, and tactile cues required  HOME EXERCISE PROGRAM: Access Code: [email or web address] URL: [email or web address] Date: 05/01/2024 Prepared by: [facility] - Rehab  Exercises - Seated Transversus Abdominis Bracing  - 2 x daily - 7 x weekly - 1 sets - 10 reps - 5 sec hold - Seated Heel Slide  - 2 x daily - 7 x weekly - 1 sets - 10 reps - Seated Ankle Dorsiflexion AROM  - 2 x daily - 7 x weekly - 1 sets - 10 reps  ASSESSMENT:  CLINICAL IMPRESSION: Pt arrived late so session was limited due to this.  Discussed NS policy and informed she already had 2; pt verbalized understanding.  Pt reports I can't do the exercises for home with my left leg.  Reviewed these and began supine exercises this session to strengthen core and LE's.  Pt was able to assume supine independently, however required AAROM to get Lt LE up into bent position for bridge.  No rise with bridge due to weakness, however noted isometric contraction and holds completed.  Lower trunk began with noted trembling in Lt LE following first 5 repetitions.  Encouraged to increase her activity at home and be compliant with exercises.  No new exercises added to HEP this session.   Patient continues to require skilled physical therapy to address her remaining impairments to return to her prior level of function.    Eval: Patient is a 53 y.o. female who was seen today for physical therapy evaluation and treatment for M47.26 (ICD-10-CM) - Other spondylosis with radiculopathy, lumbar region.  Patient demonstrates muscle weakness, reduced ROM, and fascial restrictions which are likely contributing to symptoms of pain and are negatively impacting patient ability to perform ADLs and functional mobility tasks. Patient will benefit from skilled physical therapy services to address these deficits to reduce pain and improve level of function with ADLs and functional mobility tasks. Patient is quite limited with her functional  mobility; discussed with her that she may be a good candidate for aquatic therapy if she has limited tolerance for land therapy.     OBJECTIVE IMPAIRMENTS: Abnormal gait, decreased activity tolerance, decreased balance, decreased endurance, decreased mobility, difficulty walking, decreased ROM, decreased strength, increased fascial restrictions, impaired perceived functional ability, and pain.   ACTIVITY LIMITATIONS: carrying, lifting, bending, sitting, standing, squatting, sleeping, stairs, transfers, bed mobility, bathing, toileting, dressing, locomotion level, and caring for others  PARTICIPATION LIMITATIONS: meal prep, cleaning, laundry, driving, shopping, community activity, and occupation  REHAB POTENTIAL: Good  CLINICAL DECISION MAKING: Evolving/moderate complexity  EVALUATION COMPLEXITY: Moderate   GOALS: Goals reviewed with patient? No  SHORT TERM GOALS: Target date: 05/22/2024  patient will be independent with initial HEP  Baseline: Goal status: INITIAL  2.  Patient will report 30% improvement overall  Baseline:  Goal status: INITIAL   LONG TERM GOALS: Target date: 06/12/2024  Patient will be independent in self management strategies to improve quality of life and functional outcomes.  Baseline:  Goal status: INITIAL  2.  Patient will report 50% improvement overall  Baseline:  Goal status: INITIAL  3.  Patient will improve Modified Oswestry score by 10 points to demonstrate improved perceived function  Baseline: 35/50 Goal status: INITIAL  4.  Patient  will increased distance on 2 MWT to 160 ft with LRAD to demonstrate improved ability to efficiently walk household distances.   Baseline: 64 ft Goal status: INITIAL  PLAN:  PT FREQUENCY: 1x/week  PT DURATION: 6 weeks  PLANNED INTERVENTIONS: 97164- PT Re-evaluation, 97110-Therapeutic exercises, 97530- Therapeutic activity, 97112- Neuromuscular re-education, 97535- Self Care, [id number]- Manual therapy,  [id number]- Gait training, [phone]- Orthotic Fit/training, [phone]- Canalith repositioning, [id number]- Aquatic Therapy, [phone]- Splinting, [phone]- Wound care (first 20 sq cm), 97598- Wound care (each additional 20 sq cm)Patient/Family education, Balance training, Stair training, Taping, Dry Needling, Joint mobilization, Joint manipulation, Spinal manipulation, Spinal mobilization, Scar mobilization, and DME instructions. SABRA  PLAN FOR NEXT SESSION: progress functional movement and strengthening.  Focus on abdominal bracing with activity; postural strengthening.     Lang Ada, PT 06/10/2024, 5:41 PM 5:41 PM, 06/10/24  Lang Ada, PT, DPT [facility] Office: [phone] 5:41 PM, 06/10/24

## 2024-06-10 ENCOUNTER — Encounter (HOSPITAL_COMMUNITY): Payer: Self-pay

## 2024-06-10 ENCOUNTER — Encounter (HOSPITAL_COMMUNITY)

## 2024-06-10 ENCOUNTER — Telehealth (HOSPITAL_COMMUNITY): Payer: Self-pay

## 2024-06-10 NOTE — Telephone Encounter (Signed)
 Pt was called concerning her missed treatment session this date. This is pts 3rd no show without informing front desk, per policy pt is to be discharged from this episode of care and must obtain a new referral should she want to return.  Lang Ada, PT, DPT [facility] Office: [phone] 5:39 PM, 06/10/24

## 2024-06-15 ENCOUNTER — Ambulatory Visit: Payer: Self-pay

## 2024-06-15 NOTE — Telephone Encounter (Signed)
Pt informed

## 2024-06-15 NOTE — Telephone Encounter (Signed)
 FYI Only or Action Required?: FYI only for provider: appointment scheduled on 06/17/2024.  Patient was last seen in primary care on 06/01/2024 by Tobie Suzzane POUR, MD.  Called Nurse Triage reporting Blood Sugar Problem.  Symptoms began a week ago.  Interventions attempted: Nothing.  Symptoms are: gradually worsening.  Triage Disposition: Call PCP Within 24 Hours  Patient/caregiver understands and will follow disposition?: Yes    Copied from CRM #[id number]. Topic: Clinical - Red Word Triage >> Jun 15, 2024 11:24 AM Hadassah PARAS wrote: Red Word that prompted transfer to Nurse Triage: Pt is diabetic, since Thursday sugar levels have been 265-270. This morning her levels were 141, ate a bown of cereal and level went  up to 277. Pt is wondering if her medication dosage needs to be changed. Reason for Disposition  [1] Caller has NON-URGENT medication or insulin  device (e.g., pump, continuous monitoring) question AND [2] triager unable to answer question    Offered to schedule with another provider: pt refused and stated only wants to be seen by PCP: scheduled appt on 06/17/24  Answer Assessment - Initial Assessment Questions 1. BLOOD GLUCOSE: What is your blood glucose level?      141 before breakfast and after breakfast 277 2. ONSET: When did you check the blood glucose? today 3. USUAL RANGE: What is your glucose level usually? (e.g., usual fasting morning value, usual evening value)     na 4. KETONES: Do you check for ketones (urine or blood test strips)? If Yes, ask: What does the test show now?      unsure 5. TYPE 1 or 2:  Do you know what type of diabetes you have?  (e.g., Type 1, Type 2, Gestational; doesn't know)      Type 2 6. INSULIN : Do you take insulin ? What type of insulin (s) do you use? What is the mode of delivery? (syringe, pen; injection or pump)?      no 7. DIABETES PILLS: Do you take any pills for your diabetes? If Yes, ask: Have you missed taking any  pills recently?     no 8. OTHER SYMPTOMS: Do you have any symptoms? (e.g., fever, frequent urination, difficulty breathing, dizziness, weakness, vomiting)     Dry lips, blurred vision, eye lids sticks, headache, left foot swelling 9. PREGNANCY: Is there any chance you are pregnant? When was your last menstrual period?     na Back surgery 03/25/2024.  Scheduled pt appt: pt only wanted to schedule with PCP  Protocols used: Diabetes - High Blood Sugar-A-AH

## 2024-06-16 ENCOUNTER — Encounter (HOSPITAL_COMMUNITY)

## 2024-06-17 ENCOUNTER — Ambulatory Visit: Payer: Self-pay | Admitting: Internal Medicine

## 2024-06-17 ENCOUNTER — Encounter: Payer: Self-pay | Admitting: Internal Medicine

## 2024-06-17 VITALS — BP 100/65 | HR 109 | Ht 66.0 in | Wt 275.0 lb

## 2024-06-17 DIAGNOSIS — Z7984 Long term (current) use of oral hypoglycemic drugs: Secondary | ICD-10-CM | POA: Diagnosis not present

## 2024-06-17 DIAGNOSIS — E1169 Type 2 diabetes mellitus with other specified complication: Secondary | ICD-10-CM

## 2024-06-17 DIAGNOSIS — M51362 Other intervertebral disc degeneration, lumbar region with discogenic back pain and lower extremity pain: Secondary | ICD-10-CM | POA: Diagnosis not present

## 2024-06-17 DIAGNOSIS — J309 Allergic rhinitis, unspecified: Secondary | ICD-10-CM

## 2024-06-17 MED ORDER — METFORMIN HCL ER 500 MG PO TB24
500.0000 mg | ORAL_TABLET | Freq: Two times a day (BID) | ORAL | 1 refills | Status: AC
Start: 2024-06-17 — End: ?

## 2024-06-17 MED ORDER — OZEMPIC (0.25 OR 0.5 MG/DOSE) 2 MG/3ML ~~LOC~~ SOPN: 0.2500 mg | PEN_INJECTOR | SUBCUTANEOUS | 1 refills | Status: AC

## 2024-06-17 MED ORDER — OXYCODONE-ACETAMINOPHEN 10-325 MG PO TABS
1.0000 | ORAL_TABLET | Freq: Three times a day (TID) | ORAL | 0 refills | Status: AC | PRN
Start: 2024-06-17 — End: ?

## 2024-06-17 NOTE — Assessment & Plan Note (Signed)
 Lab Results  Component Value Date   HGBA1C 7.8 (H) 05/28/2024   Uncontrolled due to diet noncompliance Associated with HTN and HLD On metformin  500 mg QD, increase to BID Started Ozempic  0.25 mg qw, plan to increase dose as tolerated Advised to follow diabetic diet and follow small, frequent meals On statin F/u CMP and lipid panel Diabetic eye exam: Advised to follow up with Ophthalmology for diabetic eye exam

## 2024-06-17 NOTE — Progress Notes (Signed)
 Acute Office Visit  Subjective:    Patient ID: Brenda Taylor, female    DOB: January 12, 1971, 53 y.o.   MRN: [id number]  Chief Complaint  Patient presents with   Diabetes    Concerns about elevated blood sugars.    HPI Patient is in today for discussing diabetes treatment.  She is currently taking metformin  500 mg QD.  She has noticed hyperglycemia spells in the last 2 weeks, up to 290, about 2 hours after meal.  She also reports polydipsia and dry mouth.  She reports taking only 1 meal in a day, and tries to avoid sweets and fried foods.  Cervical spondylosis and lumbar radiculopathy: She had anterior cervical decompression/discectomy, C3-4, C4-5, C5-6 fusion on 12/03/23.  She had L4-5 fusion surgery in 09/25.  She has severe low back pain, radiating to LLE since then.  She has difficulty ambulating, uses a cane now.  Did not benefit much from PT.  She has been taking gabapentin  600 mg TID and Percocet 10-325 mg every 6 hours as needed, requests refill of Percocet as she has not heard back from pain clinic yet.  Followed by spine surgery.   Past Medical History:  Diagnosis Date   Anxiety    Arthritis    Chronic pain    DDD (degenerative disc disease)    Depression    Diabetes mellitus without complication (HCC)    GERD (gastroesophageal reflux disease)    HBP (high blood pressure)    Headache    Hernia    History of hiatal hernia    Multiple gastric ulcers    Neuromuscular disorder (HCC)    Neuropathy bilateral feet and hands   Rotator cuff syndrome of left shoulder 10/09/2011   Ulcer disease     Past Surgical History:  Procedure Laterality Date   ANTERIOR CERVICAL DECOMP/DISCECTOMY FUSION N/A 12/03/2023   Procedure: ANTERIOR CERVICAL DECOMPRESSION/DISCECTOMY FUSION CERVICAL THREE-FOUR CERVICAL FOUR-FIVE CERVICAL FIVE-SIX EXPLORE FUSION SIX-SEVEN;  Surgeon: Debby Dorn MATSU, MD;  Location: [facility] OR;  Service: Neurosurgery;  Laterality: N/A;  3C   APPENDECTOMY     BACK SURGERY      x5   BIOPSY  05/04/2021   Procedure: BIOPSY;  Surgeon: Cindie Carlin POUR, DO;  Location: [facility];  Service: Endoscopy;;   CARPAL TUNNEL RELEASE     bilateral   CHOLECYSTECTOMY     COLONOSCOPY  10/20/2009   MFM:qmpjaoz anal canal/left-side diverticula and multiple polyps (hyperplastic). poor prep compromised exam. Next TCS 09/2014.   COLONOSCOPY WITH PROPOFOL  N/A 05/04/2021   Procedure: COLONOSCOPY WITH PROPOFOL ;  Surgeon: Cindie Carlin POUR, DO;  Location: [facility];  Service: Endoscopy;  Laterality: N/A;  9:45am   ESOPHAGOGASTRODUODENOSCOPY  03/06/2010   MFM:qnlm quadrant distal esophageal/patent tubular esophagus/small HH, antral erosions and ulcerations. Bx negative for persistent H.pylori   ESOPHAGOGASTRODUODENOSCOPY  10/20/2009   MFM:dfjoo HH/prepyloric antral ulcer s/p bx (+h.pylori), ERE   ESOPHAGOGASTRODUODENOSCOPY (EGD) WITH PROPOFOL  N/A 05/04/2021   Procedure: ESOPHAGOGASTRODUODENOSCOPY (EGD) WITH PROPOFOL ;  Surgeon: Cindie Carlin POUR, DO;  Location: [facility];  Service: Endoscopy;  Laterality: N/A;   INTRAUTERINE DEVICE INSERTION     LEEP N/A 02/14/2021   Procedure: LOOP ELECTROSURGICAL EXCISION PROCEDURE (LEEP);  Surgeon: Ozan, Jennifer, DO;  Location: [facility];  Service: Gynecology;  Laterality: N/A;   MASTECTOMY, PARTIAL Left    x 2   MOUTH SURGERY     All top teeth removed and 6-8 of bottom teeth removed   POLYPECTOMY  05/04/2021  Procedure: POLYPECTOMY;  Surgeon: Cindie Carlin POUR, DO;  Location: [facility];  Service: Endoscopy;;   ROTATOR CUFF REPAIR     right   TRANSFORAMINAL LUMBAR INTERBODY FUSION W/ MIS 1 LEVEL N/A 03/25/2024   Procedure: LEFT LUMBAR FOUR-LUMBAR FIVE MINIMALLY INVASIVE TRANSFORAMINAL LUMBAR INTERBODY FUSION/DECOMPRESSION;  Surgeon: Debby Dorn MATSU, MD;  Location: [facility] OR;  Service: Neurosurgery;  Laterality: N/A;  LEFT LUMBAR FOUR-LUMBAR FIVE MINIMALLY INVASIVE TRANSFORAMINAL LUMBAR INTERBODY FUSION/DECOMPRESSION   TUBAL LIGATION       Family History  Problem Relation Age of Onset   Heart disease Other    Arthritis Other    Cancer Other    Asthma Other    Diabetes Other    Heart disease Mother    Diabetes Mother    Cancer Mother    Non-Hodgkin's lymphoma Mother    Hypertension Mother    Arthritis Mother    Lung cancer Father    Neuropathy Father    Hypertension Father     Social History   Socioeconomic History   Marital status: Widowed    Spouse name: Not on file   Number of children: Not on file   Years of education: 9   Highest education level: 9th grade  Occupational History   Occupation: conservation officer, nature  Tobacco Use   Smoking status: Every Day    Current packs/day: 0.50    Types: Cigarettes    Passive exposure: Current   Smokeless tobacco: Never   Tobacco comments:    15 cigarettes a day as of 11/06/23  Vaping Use   Vaping status: Never Used  Substance and Sexual Activity   Alcohol use: No   Drug use: Not Currently    Frequency: 7.0 times per week    Types: Marijuana    Comment: Quit marijuana 12/31/2022   Sexual activity: Yes    Birth control/protection: I.U.D.  Other Topics Concern   Not on file  Social History Narrative   Not on file   Social Drivers of Health   Financial Resource Strain: Low Risk  (03/09/2024)   Overall Financial Resource Strain (CARDIA)    Difficulty of Paying Living Expenses: Not very hard  Food Insecurity: No Food Insecurity (03/09/2024)   Hunger Vital Sign    Worried About Running Out of Food in the Last Year: Never true    Ran Out of Food in the Last Year: Never true  Transportation Needs: No Transportation Needs (03/09/2024)   PRAPARE - Administrator, Civil Service (Medical): No    Lack of Transportation (Non-Medical): No  Physical Activity: Inactive (03/09/2024)   Exercise Vital Sign    Days of Exercise per Week: 0 days    Minutes of Exercise per Session: Not on file  Stress: Stress Concern Present (03/09/2024)   [employer] of  Occupational Health - Occupational Stress Questionnaire    Feeling of Stress: To some extent  Social Connections: Moderately Isolated (03/09/2024)   Social Connection and Isolation Panel    Frequency of Communication with Friends and Family: More than three times a week    Frequency of Social Gatherings with Friends and Family: Patient declined    Attends Religious Services: 1 to 4 times per year    Active Member of [employer] or Organizations: No    Attends Banker Meetings: Not on file    Marital Status: Widowed  Intimate Partner Violence: Not At Risk (01/06/2024)   Humiliation, Afraid, Rape, and Kick questionnaire    Fear of  Current or Ex-Partner: No    Emotionally Abused: No    Physically Abused: No    Sexually Abused: No    Outpatient Medications Prior to Visit  Medication Sig Dispense Refill   ALPRAZolam  (XANAX ) 0.5 MG tablet Take 1 tablet (0.5 mg total) by mouth 2 (two) times daily as needed for anxiety. 60 tablet 1   Aspirin-Acetaminophen -Caffeine (GOODYS EXTRA STRENGTH PO) Take 1 packet by mouth daily as needed (headache).     DULoxetine  (CYMBALTA ) 60 MG capsule Take 1 capsule (60 mg total) by mouth daily. 90 capsule 1   gabapentin  (NEURONTIN ) 300 MG capsule Take 300 mg by mouth 3 (three) times daily.     Glucose Blood (BLOOD GLUCOSE TEST STRIPS) STRP 1 each by In Vitro route in the morning, at noon, and at bedtime. May substitute to any manufacturer covered by patient's insurance. 100 strip 5   hydrochlorothiazide  (HYDRODIURIL ) 25 MG tablet TAKE 1 TABLET BY MOUTH ONCE A DAY. 30 tablet 5   methocarbamol  (ROBAXIN ) 500 MG tablet Take 1 tablet (500 mg total) by mouth every 6 (six) hours as needed for muscle spasms. 120 tablet 1   ofloxacin  (FLOXIN ) 0.3 % OTIC solution Place 5 drops into the right ear daily. 5 mL 0   pantoprazole  (PROTONIX ) 40 MG tablet TAKE (1) TABLET BY MOUTH TWICE DAILY BEFORE MEALS. (Patient taking differently: Take 40 mg by mouth 2 (two) times daily as  needed (heartburn).) 60 tablet 0   rosuvastatin  (CRESTOR ) 5 MG tablet TAKE ONE TABLET BY MOUTH ONCE DAILY. 30 tablet 11   metFORMIN  (GLUCOPHAGE -XR) 500 MG 24 hr tablet Take 1 tablet (500 mg total) by mouth daily with breakfast. 90 tablet 1   oxyCODONE -acetaminophen  (PERCOCET) 10-325 MG tablet Take 1 tablet by mouth every 8 (eight) hours as needed for pain. 45 tablet 0   No facility-administered medications prior to visit.    Allergies  Allergen Reactions   Bayer Aspirin [Aspirin] Other (See Comments)    Hx ulcers  OK to take Goody powder, rarely   Nsaids Other (See Comments)    Hx ulcers   Amoxicillin Hives    Review of Systems  Constitutional:  Negative for chills and fever.  HENT:  Positive for congestion, hearing loss (Right sided) and postnasal drip.   Eyes:  Negative for pain and discharge.  Respiratory:  Negative for cough and shortness of breath.   Cardiovascular:  Positive for leg swelling. Negative for chest pain and palpitations.  Gastrointestinal:  Negative for abdominal pain, diarrhea, nausea and vomiting.  Endocrine: Negative for polydipsia and polyuria.  Genitourinary:  Negative for dysuria and hematuria.  Musculoskeletal:  Positive for arthralgias, back pain, gait problem and neck pain. Negative for neck stiffness.  Skin:  Negative for rash.  Neurological:  Positive for weakness (B/l hands) and numbness. Negative for dizziness, seizures, syncope, speech difficulty and light-headedness.  Psychiatric/Behavioral:  Positive for sleep disturbance. Negative for agitation and behavioral problems. The patient is nervous/anxious.        Objective:    Physical Exam Vitals reviewed.  Constitutional:      General: She is not in acute distress.    Appearance: She is obese. She is not diaphoretic.  HENT:     Head: Normocephalic and atraumatic.     Nose: Congestion present.     Mouth/Throat:     Mouth: Mucous membranes are moist.     Pharynx: No posterior oropharyngeal  erythema.  Eyes:     General: No scleral icterus.  Extraocular Movements: Extraocular movements intact.  Neck:     Vascular: No carotid bruit.     Comments: Incision site C/D/I Cardiovascular:     Rate and Rhythm: Normal rate and regular rhythm.     Heart sounds: Normal heart sounds. No murmur heard. Pulmonary:     Breath sounds: Normal breath sounds. No wheezing or rales.  Musculoskeletal:        General: Tenderness (MCP joints of index and middle finger of left hand) present.     Cervical back: Neck supple. No tenderness.     Lumbar back: Tenderness present. Decreased range of motion. Positive left straight leg raise test. Negative right straight leg raise test.     Right lower leg: No edema.     Left lower leg: No edema.  Skin:    General: Skin is warm.     Findings: No rash.  Neurological:     General: No focal deficit present.     Mental Status: She is alert and oriented to person, place, and time.     Sensory: No sensory deficit.     Motor: Weakness (LUE - 4/5, LLE - 3/5) present.  Psychiatric:        Mood and Affect: Mood is anxious.        Behavior: Behavior is cooperative.     BP 100/65   Pulse (!) 109   Ht 5' 6 (1.676 m)   Wt 275 lb (124.7 kg)   LMP  (LMP Unknown)   SpO2 97%   BMI 44.39 kg/m  Wt Readings from Last 3 Encounters:  06/17/24 275 lb (124.7 kg)  05/28/24 277 lb (125.6 kg)  03/25/24 274 lb (124.3 kg)        Assessment & Plan:   Problem List Items Addressed This Visit       Respiratory   Allergic sinusitis   Chronic nasal congestion and ear fullness likely due to allergic sinusitis Advised to take Zyrtec  as needed for allergies Flonase  as needed for nasal congestion, but she does not tolerate nasal sprays Advised to use vaporizer and/or sinus inhaler as needed for nasal congestion         Endocrine   Type 2 diabetes mellitus with other specified complication (HCC) - Primary   Lab Results  Component Value Date   HGBA1C 7.8 (H)  05/28/2024   Uncontrolled due to diet noncompliance Associated with HTN and HLD On metformin  500 mg QD, increase to BID Started Ozempic  0.25 mg qw, plan to increase dose as tolerated Advised to follow diabetic diet and follow small, frequent meals On statin F/u CMP and lipid panel Diabetic eye exam: Advised to follow up with Ophthalmology for diabetic eye exam      Relevant Medications   Semaglutide ,0.25 or 0.5MG /DOS, (OZEMPIC , 0.25 OR 0.5 MG/DOSE,) 2 MG/3ML SOPN   metFORMIN  (GLUCOPHAGE -XR) 500 MG 24 hr tablet     Musculoskeletal and Integument   DDD (degenerative disc disease), lumbar   Has chronic low back pain, history of lumbar spine surgeries On gabapentin  600 mg 3 times daily Flexeril  as needed for muscle spasms S/p lumbar fusion surgery in 09/25 Has Percocet 10-325 mg q8h as needed for severe pain, referred to [facility] pain clinic - PDMP reviewed, refilled Percocet for now      Relevant Medications   oxyCODONE -acetaminophen  (PERCOCET) 10-325 MG tablet   Other Relevant Orders   Ambulatory referral to Pain Clinic     Meds ordered this encounter  Medications   Semaglutide ,0.25 or  0.5MG /DOS, (OZEMPIC , 0.25 OR 0.5 MG/DOSE,) 2 MG/3ML SOPN    Sig: Inject 0.25 mg into the skin every 7 (seven) days.    Dispense:  3 mL    Refill:  1   metFORMIN  (GLUCOPHAGE -XR) 500 MG 24 hr tablet    Sig: Take 1 tablet (500 mg total) by mouth 2 (two) times daily with a meal.    Dispense:  180 tablet    Refill:  1   oxyCODONE -acetaminophen  (PERCOCET) 10-325 MG tablet    Sig: Take 1 tablet by mouth every 8 (eight) hours as needed for pain.    Dispense:  45 tablet    Refill:  0     Elihu Milstein MARLA Blanch, MD

## 2024-06-17 NOTE — Assessment & Plan Note (Addendum)
 Has chronic low back pain, history of lumbar spine surgeries On gabapentin  600 mg 3 times daily Flexeril  as needed for muscle spasms S/p lumbar fusion surgery in 09/25 Has Percocet 10-325 mg q8h as needed for severe pain, referred to [facility] pain clinic - PDMP reviewed, refilled Percocet for now

## 2024-06-17 NOTE — Assessment & Plan Note (Signed)
 Chronic nasal congestion and ear fullness likely due to allergic sinusitis Advised to take Zyrtec  as needed for allergies Flonase  as needed for nasal congestion, but she does not tolerate nasal sprays Advised to use vaporizer and/or sinus inhaler as needed for nasal congestion

## 2024-06-17 NOTE — Patient Instructions (Addendum)
 Please start taking Metformin  500 mg twice daily.  Please start taking Ozempic  0.25 mg once weekly as prescribed.  Please follow small, frequent meals.  Please continue to take other medications as prescribed.  Please continue to follow low carb diet and perform moderate exercise/walking at least 150 mins/week.

## 2024-06-23 ENCOUNTER — Encounter (HOSPITAL_COMMUNITY)

## 2024-06-26 ENCOUNTER — Other Ambulatory Visit: Payer: Self-pay | Admitting: Internal Medicine

## 2024-06-26 DIAGNOSIS — I10 Essential (primary) hypertension: Secondary | ICD-10-CM

## 2024-06-30 ENCOUNTER — Encounter (HOSPITAL_COMMUNITY)

## 2024-07-03 ENCOUNTER — Other Ambulatory Visit: Payer: Self-pay | Admitting: Internal Medicine

## 2024-07-03 DIAGNOSIS — F411 Generalized anxiety disorder: Secondary | ICD-10-CM

## 2024-07-06 ENCOUNTER — Telehealth: Payer: Self-pay

## 2024-07-06 ENCOUNTER — Other Ambulatory Visit: Payer: Self-pay | Admitting: Internal Medicine

## 2024-07-06 DIAGNOSIS — M51362 Other intervertebral disc degeneration, lumbar region with discogenic back pain and lower extremity pain: Secondary | ICD-10-CM

## 2024-07-06 MED ORDER — OXYCODONE-ACETAMINOPHEN 10-325 MG PO TABS: 1.0000 | ORAL_TABLET | Freq: Three times a day (TID) | ORAL | 0 refills | Status: DC | PRN

## 2024-07-06 NOTE — Telephone Encounter (Signed)
 Copied from CRM [id number]. Topic: Clinical - Medication Question >> Jul 06, 2024 12:56 PM Winona R wrote: Pt would like to know if the provider can fill her oxyCODONE -acetaminophen  (PERCOCET) 10-325 MG tablet as she had been trying to get into the pain management clinic but  [facility] pain clinic has rescheduled her three times since Friday. Went in Friday and was told she had to come back Saturday, She was then called on sat and resch for today however she was again called and rescheduled all due to not having a provider in office. Pt is completely out of pain medication and would like to be referred to a new Pain management office.

## 2024-07-07 NOTE — Telephone Encounter (Signed)
 Pt has been informed.

## 2024-07-28 ENCOUNTER — Telehealth: Payer: Self-pay | Admitting: Internal Medicine

## 2024-07-28 DIAGNOSIS — M51362 Other intervertebral disc degeneration, lumbar region with discogenic back pain and lower extremity pain: Secondary | ICD-10-CM

## 2024-07-28 MED ORDER — OXYCODONE-ACETAMINOPHEN 10-325 MG PO TABS: 1.0000 | ORAL_TABLET | Freq: Three times a day (TID) | ORAL | 0 refills | Status: DC | PRN

## 2024-07-28 NOTE — Telephone Encounter (Signed)
"  Patient advised  "

## 2024-07-28 NOTE — Telephone Encounter (Unsigned)
 Copied from CRM [id number]. Topic: Clinical - Medication Refill >> Jul 28, 2024  2:44 PM Harlene ORN wrote: Medication: oxyCODONE -acetaminophen  (PERCOCET) 10-325 MG tablet  Has the patient contacted their pharmacy? Yes (Agent: If no, request that the patient contact the pharmacy for the refill. If patient does not wish to contact the pharmacy document the reason why and proceed with request.) (Agent: If yes, when and what did the pharmacy advise?)  This is the patient's preferred pharmacy:  [facility] - [place], [place] - [address] [place] [address] Phone: [phone] Fax: [phone]  Is this the correct pharmacy for this prescription? Yes If no, delete pharmacy and type the correct one.   Has the prescription been filled recently? No  Is the patient out of the medication? No  Has the patient been seen for an appointment in the last year OR does the patient have an upcoming appointment? Yes  Can we respond through MyChart? No  Agent: Please be advised that Rx refills may take up to 3 business days. We ask that you follow-up with your pharmacy.

## 2024-08-06 ENCOUNTER — Other Ambulatory Visit: Payer: Self-pay | Admitting: Internal Medicine

## 2024-08-06 DIAGNOSIS — M51362 Other intervertebral disc degeneration, lumbar region with discogenic back pain and lower extremity pain: Secondary | ICD-10-CM

## 2024-08-06 DIAGNOSIS — M4722 Other spondylosis with radiculopathy, cervical region: Secondary | ICD-10-CM

## 2024-08-13 ENCOUNTER — Telehealth: Payer: Self-pay

## 2024-08-13 DIAGNOSIS — M51362 Other intervertebral disc degeneration, lumbar region with discogenic back pain and lower extremity pain: Secondary | ICD-10-CM

## 2024-08-13 MED ORDER — OXYCODONE-ACETAMINOPHEN 10-325 MG PO TABS: 1.0000 | ORAL_TABLET | Freq: Three times a day (TID) | ORAL | 0 refills | Status: AC | PRN

## 2024-08-13 NOTE — Addendum Note (Signed)
 Addended by: ANTONETTA ROLLENE BRAVO on: 08/13/2024 12:25 PM   Modules accepted: Orders

## 2024-08-13 NOTE — Telephone Encounter (Signed)
 Med is sent with authorization to fill a few days early due to weather

## 2024-08-13 NOTE — Telephone Encounter (Signed)
 Copied from CRM #[id number]. Topic: Clinical - Medication Refill >> Aug 13, 2024  9:40 AM Travis F wrote: Medication: oxyCODONE -acetaminophen  (PERCOCET) 10-325 MG tablet [[id number]]  Has the patient contacted their pharmacy? Yes  (Agent: If yes, when and what did the pharmacy advise?) contact office   This is the patient's preferred pharmacy:  [facility] - [place], [place] - [address] [place] [address] Phone: [phone] Fax: [phone]  Is this the correct pharmacy for this prescription? Yes If no, delete pharmacy and type the correct one.   Has the prescription been filled recently? Yes  Is the patient out of the medication? No, patient will be out Sunday  Has the patient been seen for an appointment in the last year OR does the patient have an upcoming appointment? Yes  Can we respond through MyChart? No  Agent: Please be advised that Rx refills may take up to 3 business days. We ask that you follow-up with your pharmacy.

## 2024-08-13 NOTE — Telephone Encounter (Signed)
 Upon review of Referral - Referral was faxed to incorrect fax number for [facility] - I have refaxed the Referral to the correct fax number for [facility] - Once their Office reviews the Referral and accepts Referral - they will contact Patient in regards to Appointment Scheduling.

## 2024-08-13 NOTE — Telephone Encounter (Signed)
 Copied from CRM #[id number]. Topic: General - Other >> Aug 13, 2024  9:42 AM Travis F wrote: Reason for CRM: Patient is calling in because she wanted to let Dr. Tobie know that she still hasn't heard from pain management.

## 2024-08-31 ENCOUNTER — Ambulatory Visit: Admitting: Internal Medicine
